# Patient Record
Sex: Female | Born: 1960 | Race: Black or African American | Hispanic: No | Marital: Single | Smoking: Current every day smoker
Health system: Southern US, Community
[De-identification: ages and names within clinical notes are randomized; demographics above are authoritative.]

## PROBLEM LIST (undated history)

## (undated) DIAGNOSIS — J45909 Unspecified asthma, uncomplicated: Secondary | ICD-10-CM

## (undated) DIAGNOSIS — I201 Angina pectoris with documented spasm: Secondary | ICD-10-CM

## (undated) DIAGNOSIS — D369 Benign neoplasm, unspecified site: Secondary | ICD-10-CM

## (undated) DIAGNOSIS — F1910 Other psychoactive substance abuse, uncomplicated: Secondary | ICD-10-CM

## (undated) DIAGNOSIS — I639 Cerebral infarction, unspecified: Secondary | ICD-10-CM

## (undated) DIAGNOSIS — I2510 Atherosclerotic heart disease of native coronary artery without angina pectoris: Secondary | ICD-10-CM

## (undated) DIAGNOSIS — I10 Essential (primary) hypertension: Secondary | ICD-10-CM

## (undated) HISTORY — DX: Angina pectoris with documented spasm: I20.1

---

## 2005-12-09 ENCOUNTER — Emergency Department: Payer: Self-pay | Admitting: Emergency Medicine

## 2006-02-12 ENCOUNTER — Emergency Department: Payer: Self-pay | Admitting: Emergency Medicine

## 2006-02-12 ENCOUNTER — Other Ambulatory Visit: Payer: Self-pay

## 2006-04-19 ENCOUNTER — Emergency Department: Payer: Self-pay | Admitting: Emergency Medicine

## 2006-10-15 ENCOUNTER — Emergency Department: Payer: Self-pay | Admitting: Emergency Medicine

## 2006-10-15 ENCOUNTER — Other Ambulatory Visit: Payer: Self-pay

## 2007-09-24 DIAGNOSIS — F17210 Nicotine dependence, cigarettes, uncomplicated: Secondary | ICD-10-CM | POA: Insufficient documentation

## 2007-09-24 DIAGNOSIS — J45909 Unspecified asthma, uncomplicated: Secondary | ICD-10-CM | POA: Insufficient documentation

## 2007-12-11 DIAGNOSIS — K219 Gastro-esophageal reflux disease without esophagitis: Secondary | ICD-10-CM | POA: Insufficient documentation

## 2008-10-07 ENCOUNTER — Ambulatory Visit: Payer: Self-pay | Admitting: Certified Nurse Midwife

## 2010-01-26 ENCOUNTER — Ambulatory Visit: Payer: Self-pay | Admitting: Family Medicine

## 2010-03-13 ENCOUNTER — Ambulatory Visit: Payer: Self-pay | Admitting: Gastroenterology

## 2010-03-15 LAB — PATHOLOGY REPORT

## 2011-04-02 ENCOUNTER — Ambulatory Visit: Payer: Self-pay | Admitting: Family Medicine

## 2011-10-17 ENCOUNTER — Emergency Department: Payer: Self-pay | Admitting: Emergency Medicine

## 2012-03-13 DIAGNOSIS — H1010 Acute atopic conjunctivitis, unspecified eye: Secondary | ICD-10-CM | POA: Insufficient documentation

## 2012-12-02 ENCOUNTER — Emergency Department: Payer: Self-pay | Admitting: Emergency Medicine

## 2012-12-02 LAB — TROPONIN I: Troponin-I: <0.02 ng/mL

## 2012-12-02 LAB — BASIC METABOLIC PANEL
Anion Gap: 6 — ABNORMAL LOW (ref 7–16)
BUN: 12 mg/dL (ref 7–18)
Calcium, Total: 9.3 mg/dL (ref 8.5–10.1)
Chloride: 110 mmol/L — ABNORMAL HIGH (ref 98–107)
Co2: 24 mmol/L (ref 21–32)
Creatinine: 1.04 mg/dL (ref 0.60–1.30)
EGFR (African American): >60
EGFR (Non-African Amer.): >60
Glucose: 86 mg/dL (ref 65–99)
Osmolality: 278 (ref 275–301)
Potassium: 3.5 mmol/L (ref 3.5–5.1)
Sodium: 140 mmol/L (ref 136–145)

## 2012-12-02 LAB — CBC
HCT: 42.1 % (ref 35.0–47.0)
HGB: 13.8 g/dL (ref 12.0–16.0)
MCH: 28.4 pg (ref 26.0–34.0)
MCHC: 32.7 g/dL (ref 32.0–36.0)
MCV: 87 fL (ref 80–100)
Platelet: 251 10*3/uL (ref 150–440)
RBC: 4.85 10*6/uL (ref 3.80–5.20)
RDW: 13.9 % (ref 11.5–14.5)
WBC: 11.4 10*3/uL — ABNORMAL HIGH (ref 3.6–11.0)

## 2012-12-10 ENCOUNTER — Ambulatory Visit: Payer: Self-pay | Admitting: Family Medicine

## 2013-02-04 ENCOUNTER — Ambulatory Visit: Payer: Self-pay | Admitting: Cardiology

## 2013-07-07 DIAGNOSIS — J441 Chronic obstructive pulmonary disease with (acute) exacerbation: Secondary | ICD-10-CM

## 2013-07-29 ENCOUNTER — Emergency Department: Payer: Self-pay | Admitting: Emergency Medicine

## 2013-07-29 LAB — RAPID INFLUENZA A&B ANTIGENS

## 2013-09-22 DIAGNOSIS — J984 Other disorders of lung: Secondary | ICD-10-CM | POA: Insufficient documentation

## 2013-09-25 ENCOUNTER — Ambulatory Visit: Payer: Self-pay | Admitting: Family Medicine

## 2013-10-21 DIAGNOSIS — I10 Essential (primary) hypertension: Secondary | ICD-10-CM | POA: Insufficient documentation

## 2013-12-17 DIAGNOSIS — Z800 Family history of malignant neoplasm of digestive organs: Secondary | ICD-10-CM | POA: Insufficient documentation

## 2013-12-17 DIAGNOSIS — D369 Benign neoplasm, unspecified site: Secondary | ICD-10-CM | POA: Insufficient documentation

## 2014-01-05 ENCOUNTER — Ambulatory Visit: Payer: Self-pay | Admitting: Gastroenterology

## 2014-01-06 LAB — PATHOLOGY REPORT

## 2014-01-22 ENCOUNTER — Emergency Department: Payer: Self-pay | Admitting: Emergency Medicine

## 2014-01-22 LAB — BASIC METABOLIC PANEL
Anion Gap: 6 — ABNORMAL LOW (ref 7–16)
BUN: 11 mg/dL (ref 7–18)
Calcium, Total: 9 mg/dL (ref 8.5–10.1)
Chloride: 109 mmol/L — ABNORMAL HIGH (ref 98–107)
Co2: 25 mmol/L (ref 21–32)
Creatinine: 1.29 mg/dL (ref 0.60–1.30)
EGFR (African American): 54 — ABNORMAL LOW
EGFR (Non-African Amer.): 47 — ABNORMAL LOW
Glucose: 122 mg/dL — ABNORMAL HIGH (ref 65–99)
Osmolality: 280 (ref 275–301)
Potassium: 4.3 mmol/L (ref 3.5–5.1)
Sodium: 140 mmol/L (ref 136–145)

## 2014-01-22 LAB — CBC
HCT: 43.4 % (ref 35.0–47.0)
HGB: 14 g/dL (ref 12.0–16.0)
MCH: 28.5 pg (ref 26.0–34.0)
MCHC: 32.3 g/dL (ref 32.0–36.0)
MCV: 88 fL (ref 80–100)
Platelet: 245 10*3/uL (ref 150–440)
RBC: 4.91 10*6/uL (ref 3.80–5.20)
RDW: 13.5 % (ref 11.5–14.5)
WBC: 6.7 10*3/uL (ref 3.6–11.0)

## 2014-01-22 LAB — TROPONIN I: Troponin-I: <0.02 ng/mL

## 2014-01-22 LAB — PRO B NATRIURETIC PEPTIDE: B-Type Natriuretic Peptide: 55 pg/mL (ref 0–125)

## 2014-11-10 DIAGNOSIS — F331 Major depressive disorder, recurrent, moderate: Secondary | ICD-10-CM | POA: Insufficient documentation

## 2014-11-22 ENCOUNTER — Other Ambulatory Visit: Payer: Self-pay | Admitting: Family Medicine

## 2014-11-22 DIAGNOSIS — J984 Other disorders of lung: Secondary | ICD-10-CM

## 2014-11-24 ENCOUNTER — Emergency Department: Payer: Medicaid Other

## 2014-11-24 ENCOUNTER — Ambulatory Visit: Admission: RE | Admit: 2014-11-24 | Payer: Medicaid Other | Source: Ambulatory Visit

## 2014-11-24 ENCOUNTER — Inpatient Hospital Stay
Admission: EM | Admit: 2014-11-24 | Discharge: 2014-11-25 | DRG: 282 | Disposition: A | Discharge status: Home / Self Care | Payer: Medicaid Other | Attending: Internal Medicine | Admitting: Internal Medicine

## 2014-11-24 DIAGNOSIS — Z79899 Other long term (current) drug therapy: Secondary | ICD-10-CM

## 2014-11-24 DIAGNOSIS — I10 Essential (primary) hypertension: Secondary | ICD-10-CM | POA: Diagnosis present

## 2014-11-24 DIAGNOSIS — I25111 Atherosclerotic heart disease of native coronary artery with angina pectoris with documented spasm: Secondary | ICD-10-CM | POA: Diagnosis present

## 2014-11-24 DIAGNOSIS — I214 Non-ST elevation (NSTEMI) myocardial infarction: Principal | ICD-10-CM | POA: Diagnosis present

## 2014-11-24 DIAGNOSIS — Z8249 Family history of ischemic heart disease and other diseases of the circulatory system: Secondary | ICD-10-CM

## 2014-11-24 DIAGNOSIS — J45909 Unspecified asthma, uncomplicated: Secondary | ICD-10-CM | POA: Diagnosis present

## 2014-11-24 DIAGNOSIS — R079 Chest pain, unspecified: Secondary | ICD-10-CM | POA: Diagnosis present

## 2014-11-24 DIAGNOSIS — F17210 Nicotine dependence, cigarettes, uncomplicated: Secondary | ICD-10-CM | POA: Diagnosis present

## 2014-11-24 HISTORY — DX: Unspecified asthma, uncomplicated: J45.909

## 2014-11-24 HISTORY — DX: Other psychoactive substance abuse, uncomplicated: F19.10

## 2014-11-24 HISTORY — DX: Atherosclerotic heart disease of native coronary artery without angina pectoris: I25.10

## 2014-11-24 HISTORY — DX: Essential (primary) hypertension: I10

## 2014-11-24 LAB — CBC
HCT: 42.2 % (ref 35.0–47.0)
Hemoglobin: 13.7 g/dL (ref 12.0–16.0)
MCH: 27.9 pg (ref 26.0–34.0)
MCHC: 32.4 g/dL (ref 32.0–36.0)
MCV: 86.1 fL (ref 80.0–100.0)
Platelets: 214 10*3/uL (ref 150–440)
RBC: 4.9 MIL/uL (ref 3.80–5.20)
RDW: 14.1 % (ref 11.5–14.5)
WBC: 11.7 10*3/uL — ABNORMAL HIGH (ref 3.6–11.0)

## 2014-11-24 LAB — BASIC METABOLIC PANEL
Anion gap: 7 (ref 5–15)
BUN: 15 mg/dL (ref 6–20)
CO2: 26 mmol/L (ref 22–32)
Calcium: 9.4 mg/dL (ref 8.9–10.3)
Chloride: 107 mmol/L (ref 101–111)
Creatinine, Ser: 1.28 mg/dL — ABNORMAL HIGH (ref 0.44–1.00)
GFR calc Af Amer: 54 mL/min — ABNORMAL LOW (ref >60)
GFR calc non Af Amer: 47 mL/min — ABNORMAL LOW (ref >60)
Glucose, Bld: 101 mg/dL — ABNORMAL HIGH (ref 65–99)
Potassium: 3.8 mmol/L (ref 3.5–5.1)
Sodium: 140 mmol/L (ref 135–145)

## 2014-11-24 LAB — CK TOTAL AND CKMB (NOT AT ARMC)
CK, MB: 4.9 ng/mL (ref 0.5–5.0)
Relative Index: 2.2 (ref 0.0–2.5)
Total CK: 225 U/L (ref 38–234)

## 2014-11-24 LAB — TROPONIN I
Troponin I: 0.33 ng/mL — ABNORMAL HIGH (ref <0.031)
Troponin I: 0.38 ng/mL — ABNORMAL HIGH (ref <0.031)
Troponin I: 0.56 ng/mL — ABNORMAL HIGH (ref <0.031)

## 2014-11-24 MED ORDER — ONDANSETRON HCL 4 MG/2ML IJ SOLN: 4.0000 mg | Freq: Four times a day (QID) | INTRAMUSCULAR | Status: DC | PRN

## 2014-11-24 MED ORDER — MOMETASONE FURO-FORMOTEROL FUM 100-5 MCG/ACT IN AERO
2.0000 | INHALATION_SPRAY | Freq: Two times a day (BID) | RESPIRATORY_TRACT | Status: DC
  Administered 2014-11-24 – 2014-11-25 (×2): 2 via RESPIRATORY_TRACT
  Filled 2014-11-24: qty 8.8

## 2014-11-24 MED ORDER — ACETAMINOPHEN 325 MG PO TABS
650.0000 mg | ORAL_TABLET | ORAL | Status: DC | PRN
  Administered 2014-11-24: 650 mg via ORAL
  Filled 2014-11-24: qty 2

## 2014-11-24 MED ORDER — ALBUTEROL SULFATE HFA 108 (90 BASE) MCG/ACT IN AERS: 2.0000 | INHALATION_SPRAY | Freq: Four times a day (QID) | RESPIRATORY_TRACT | Status: DC | PRN

## 2014-11-24 MED ORDER — ALBUTEROL SULFATE (2.5 MG/3ML) 0.083% IN NEBU: 2.5000 mg | INHALATION_SOLUTION | Freq: Four times a day (QID) | RESPIRATORY_TRACT | Status: DC | PRN

## 2014-11-24 MED ORDER — HEPARIN SODIUM (PORCINE) 5000 UNIT/ML IJ SOLN
5000.0000 [IU] | Freq: Three times a day (TID) | INTRAMUSCULAR | Status: DC
  Administered 2014-11-24 – 2014-11-25 (×2): 5000 [IU] via SUBCUTANEOUS
  Filled 2014-11-24 (×3): qty 1

## 2014-11-24 MED ORDER — ASPIRIN EC 325 MG PO TBEC
325.0000 mg | DELAYED_RELEASE_TABLET | Freq: Every day | ORAL | Status: DC
  Administered 2014-11-25: 325 mg via ORAL
  Filled 2014-11-24: qty 1

## 2014-11-24 MED ORDER — LISINOPRIL 10 MG PO TABS
10.0000 mg | ORAL_TABLET | Freq: Every day | ORAL | Status: DC
  Filled 2014-11-24: qty 1

## 2014-11-24 MED ORDER — LORATADINE 10 MG PO TABS
10.0000 mg | ORAL_TABLET | Freq: Every day | ORAL | Status: DC
  Administered 2014-11-25: 10 mg via ORAL
  Filled 2014-11-24: qty 1

## 2014-11-24 NOTE — H&P (Signed)
[facility] NAME: Anne [name]    MR#:  [id number]  DATE OF BIRTH:  09/29/1960  DATE OF ADMISSION:  11/24/2014  PRIMARY CARE PHYSICIAN: No primary care provider on file.   REQUESTING/REFERRING PHYSICIAN:Dr LORD  CHIEF COMPLAINT:   Chief Complaint  Patient presents with  . Chest Pain    HISTORY OF PRESENT ILLNESS:  Anne [name]  is a 54 y.o. female with a known history of asthma, hypertension comes to the emergency room after she had experienced some tooth ache this morning. She took 2 BC powder tablets and went to sleep. She woke up with chest pain on the right side which was painful on palpation when she came to the emergency room. Her chest pain is resolved during my evaluation. Patient's troponin came back 0.3. EKG is normal sinus rhythm.  Patient reports that she's been having this kind of pain on and off for many months. She had of negative stress test about a year ago.  PAST MEDICAL HISTORY:   Past Medical History  Diagnosis Date  . Asthma   . Hypertension     PAST SURGICAL HISTOIRY:  History reviewed. No pertinent past surgical history.  SOCIAL HISTORY:   History  Substance Use Topics  . Smoking status: Current Every Day Smoker -- 1.00 packs/day  . Smokeless tobacco: Never Used  . Alcohol Use: Yes    FAMILY HISTORY:  HTN  DRUG ALLERGIES:  No Known Allergies  REVIEW OF SYSTEMS:   Review of Systems  Constitutional: Negative for fever, chills and diaphoresis.  HENT: Negative for congestion, ear pain, hearing loss, nosebleeds and sore throat.   Eyes: Negative for blurred vision, double vision, photophobia and pain.  Respiratory: Negative for hemoptysis, sputum production, shortness of breath, wheezing and stridor.   Cardiovascular: Positive for chest pain. Negative for palpitations, orthopnea, claudication and leg swelling.  Gastrointestinal: Negative for heartburn and abdominal pain.   Genitourinary: Negative for dysuria and frequency.  Musculoskeletal: Negative for back pain, joint pain and neck pain.  Skin: Negative for rash.  Neurological: Negative for tingling, sensory change, speech change, focal weakness, seizures and headaches.  Endo/Heme/Allergies: Does not bruise/bleed easily.  Psychiatric/Behavioral: Negative for memory loss and substance abuse. The patient is not nervous/anxious.   All other systems reviewed and are negative.     MEDICATIONS AT HOME:   Prior to Admission medications   Medication Sig Start Date End Date Taking? Authorizing Provider  albuterol (PROVENTIL HFA;VENTOLIN HFA) 108 (90 BASE) MCG/ACT inhaler Inhale 2 puffs into the lungs every 6 (six) hours as needed for wheezing or shortness of breath.   Yes Historical Provider, MD  albuterol (PROVENTIL) (2.5 MG/3ML) 0.083% nebulizer solution Take 2.5 mg by nebulization every 6 (six) hours as needed for wheezing or shortness of breath.   Yes Historical Provider, MD  cetirizine (ZYRTEC) 10 MG tablet Take 10 mg by mouth daily as needed for allergies.    Yes Historical Provider, MD  Fluticasone-Salmeterol (ADVAIR) 250-50 MCG/DOSE AEPB Inhale 1 puff into the lungs 2 (two) times daily.   Yes Historical Provider, MD  lisinopril (PRINIVIL,ZESTRIL) 10 MG tablet Take 10 mg by mouth daily.   Yes Historical Provider, MD      VITAL SIGNS:  Blood pressure 139/82, pulse 81, temperature 97.7 F (36.5 C), resp. rate 21, height 5\' 8"  (1.727 m), weight 97.523 kg (215 lb), SpO2 97 %.  PHYSICAL EXAMINATION:  GENERAL:  54 y.o.-year-old patient lying in the  bed with no acute distress.  EYES: Pupils equal, round, reactive to light and accommodation. No scleral icterus. Extraocular muscles intact.  HEENT: Head atraumatic, normocephalic. Oropharynx and nasopharynx clear.  NECK:  Supple, no jugular venous distention. No thyroid enlargement, no tenderness.  LUNGS: Normal breath sounds bilaterally, no wheezing,  rales,rhonchi or crepitation. No use of accessory muscles of respiration.  CARDIOVASCULAR: S1, S2 normal. No murmurs, rubs, or gallops.  -pain on palpation at the right 2nd and 3rd costochondral area ABDOMEN: Soft, nontender, nondistended. Bowel sounds present. No organomegaly or mass.  EXTREMITIES: No pedal edema, cyanosis, or clubbing.  NEUROLOGIC: Cranial nerves II through XII are intact. Muscle strength 5/5 in all extremities. Sensation intact. Gait not checked.  PSYCHIATRIC: The patient is alert and oriented x 3.  SKIN: No obvious rash, lesion, or ulcer.   LABORATORY PANEL:   CBC  Recent Labs Lab 11/24/14 1544  WBC 11.7*  HGB 13.7  HCT 42.2  PLT 214   ------------------------------------------------------------------------------------------------------------------  Chemistries   Recent Labs Lab 11/24/14 1544  NA 140  K 3.8  CL 107  CO2 26  GLUCOSE 101*  BUN 15  CREATININE 1.28*  CALCIUM 9.4   ------------------------------------------------------------------------------------------------------------------  Cardiac Enzymes  Recent Labs Lab 11/24/14 1544  TROPONINI 0.33*   ------------------------------------------------------------------------------------------------------------------  RADIOLOGY:  Dg Chest Port 1 View  11/24/2014   CLINICAL DATA:  Chest pain.  Hypertension.  EXAM: PORTABLE CHEST - 1 VIEW  COMPARISON:  January 22, 2014  FINDINGS: There is no edema or consolidation. Heart is upper normal in size with pulmonary vascularity within normal limits. No adenopathy. No bone lesions. No pneumothorax.  IMPRESSION: No edema or consolidation.   Electronically Signed   By: Lowella Grip III M.D.   On: 11/24/2014 16:13    EKG:   NSR. No ST evelation or depression  IMPRESSION AND PLAN:   #1 chest pain right-sided  Precipitated by palpation appears atypical likely secondary to costochondritis. Patient's first troponin was 0.3. EKG no ST elevation or  depression normal sinus rhythm. Patient is chest pain-free at present. She is requesting to go home. Recommended to follow up with cardiology as outpatient. Cardiology on call for unassigned is the bowel cardiology group. Patient is recommended to take baby aspirin daily. She is also recommended take ibuprofen with meals if her pain continues on and off. Recommended follow-up with her PCP. #2 hypertension continue home meds #3 tobacco abuse counseling done about 4 minutes spent patient voices understanding. Patient would like to go home. Respective offer second troponin level. I have asked her for me to review the results before I let her go home she is agreeable to it.    All the records are reviewed and case discussed with Consulting provider. Management plans discussed with the patient, family and they are in agreement.  CODE STATUS:full   TOTAL TIME TAKING CARE OF THIS PATIENT: 50 minutes.    Millard Bautch M.D on 11/24/2014 at 8:36 PM  Between 7am to 6pm - Pager - [phone] After 6pm go to [email or web address] - password [email or web address] [facility]  Office  [phone]  CC: Primary care Physician: No primary care provider on file.

## 2014-11-24 NOTE — ED Notes (Signed)
Pt sent to the ED via EMS from [facility] with c/o having tooth pain this morning around 11am, took a b/c and laid back down, woke back up with chest pain around 1300 and went to scheduled apt at [facility] for chest CT due to a spot found on her lungs.Anne Kitchenand was referred to ED.Anne [name]

## 2014-11-24 NOTE — ED Provider Notes (Signed)
[facility] Emergency Department Provider Note   ____________________________________________  Time seen: 4:30 PM  I have reviewed the triage vital signs and the nursing notes.   HISTORY  Chief Complaint Chest Pain    HPI Anne [name] is a 54 y.o. female presents with right-sided sharp chest pain which lasted for approximately 5 minutes while she was preparing to get a chest CT without contrast as a follow-up from 1 year ago where she was found have a pulmonary nodule on prior chest imaging. Today she had no shortness of breath, no fever, no diaphoresis and the chest pain is now gone. She had an episode of chest pain one week ago. She has had on and off since one year ago. She did have a cardiology evaluation including a stress test last year. Patient is followed by primary care at the [facility] and states that she is going to start seeing a counselor for stress and anxiety. She is on medication for depression. States that she might be having anxiety symptoms.      Past Medical History  Diagnosis Date  . Asthma   . Hypertension     There are no active problems to display for this patient.   History reviewed. No pertinent past surgical history.  Current Outpatient Rx  Name  Route  Sig  Dispense  Refill  . albuterol (PROVENTIL HFA;VENTOLIN HFA) 108 (90 BASE) MCG/ACT inhaler   Inhalation   Inhale 2 puffs into the lungs every 6 (six) hours as needed for wheezing or shortness of breath.         Marland Kitchen albuterol (PROVENTIL) (2.5 MG/3ML) 0.083% nebulizer solution   Nebulization   Take 2.5 mg by nebulization every 6 (six) hours as needed for wheezing or shortness of breath.         . cetirizine (ZYRTEC) 10 MG tablet   Oral   Take 10 mg by mouth daily as needed for allergies.          . Fluticasone-Salmeterol (ADVAIR) 250-50 MCG/DOSE AEPB   Inhalation   Inhale 1 puff into the lungs 2 (two) times daily.         Marland Kitchen lisinopril (PRINIVIL,ZESTRIL) 10  MG tablet   Oral   Take 10 mg by mouth daily.           Allergies Review of patient's allergies indicates no known allergies.  No family history on file.  Social History History  Substance Use Topics  . Smoking status: Current Every Day Smoker -- 1.00 packs/day  . Smokeless tobacco: Never Used  . Alcohol Use: Yes    Review of Systems  Constitutional: Negative for fever. Eyes: Negative for visual changes. ENT: Negative for sore throat. Cardiovascular: Negative for palpitations Respiratory: Negative for shortness of breath. Gastrointestinal: Negative for abdominal pain, vomiting and diarrhea. Genitourinary: Negative for dysuria. Musculoskeletal: Negative for back pain. Skin: Negative for rash. Neurological: Negative for headaches, focal weakness or numbness.   10-point ROS otherwise negative.  ____________________________________________   PHYSICAL EXAM:  VITAL SIGNS: ED Triage Vitals  Enc Vitals Group     BP 11/24/14 1530 169/89 mmHg     Pulse Rate 11/24/14 1530 71     Resp 11/24/14 1530 22     Temp 11/24/14 1530 97.7 F (36.5 C)     Temp src --      SpO2 11/24/14 1530 99 %     Weight 11/24/14 1530 215 lb (97.523 kg)     Height 11/24/14 1530 5'  8" (1.727 m)     Head Cir --      Peak Flow --      Pain Score --      Pain Loc --      Pain Edu? --      Excl. in [place]? --      Constitutional: Alert and oriented. Well appearing and in no distress. Eyes: Conjunctivae are normal. PERRL. Normal extraocular movements. ENT   Head: Normocephalic and atraumatic.   Nose: No congestion/rhinnorhea.   Mouth/Throat: Mucous membranes are moist.   Neck: No stridor. Cardiovascular: Normal rate, regular rhythm.  No murmurs, rubs, or gallops. Respiratory: Normal respiratory effort without tachypnea nor retractions. Breath sounds are clear and equal bilaterally. No wheezes/rales/rhonchi. Gastrointestinal: Soft and nontender. No distention.  Genitourinary:   Musculoskeletal: Chest nontender to palpation Nontender with normal range of motion in all extremities. No joint effusions.  No lower extremity tenderness nor edema. Neurologic:  Normal speech and language. No gross focal neurologic deficits are appreciated. Speech is normal. No gait instability. Skin:  Skin is warm, dry and intact. No rash noted. Psychiatric: Mood and affect are normal. Speech and behavior are normal. Patient exhibits appropriate insight and judgment.  ____________________________________________   EKG  Normal sinus rhythm 62 bpm. Narrow QRS. Normal axis. Normal ST and T waves.  ____________________________________________   LABS (pertinent positives/negatives)  Account 11.7 with a hemoglobin of 13.7 Troponin 0.33 Crowning 1.28 ____________________________________________    RADIOLOGY  Chest x-ray results reviewed: No edema or consolidation  ____________________________________________   PROCEDURES  Procedure(s) performed: No Critical Care performed:  No  ____________________________________________   INITIAL IMPRESSION / ASSESSMENT AND PLAN / ED COURSE  Pertinent labs & imaging results that were available during my care of the patient were reviewed by me and considered in my medical decision making (see chart for details).  Clinically seems like atypical chest pain on the right side however her troponin did come back elevated 0.33. She was already given 324 mg of aspirin by mouth. Consultation to hospitalist for admission. Decision require regarding Regulation will be made by the hospitalist, patient is chest pain-free now.  ____________________________________________   FINAL CLINICAL IMPRESSION(S) / ED DIAGNOSES Acute Nonspecific chest pain     Lisa Roca, MD 11/24/14 1810

## 2014-11-24 NOTE — ED Notes (Signed)
Patient elevated troponin level. Reita Cliche, MD notified.

## 2014-11-25 ENCOUNTER — Encounter
Admission: EM | Disposition: A | Discharge status: Home / Self Care | Payer: Medicaid Other | Source: Home / Self Care | Attending: Internal Medicine

## 2014-11-25 ENCOUNTER — Encounter: Payer: Self-pay | Admitting: Physician Assistant

## 2014-11-25 ENCOUNTER — Encounter
Admission: EM | Disposition: A | Discharge status: Home / Self Care | Payer: Self-pay | Source: Home / Self Care | Attending: Internal Medicine

## 2014-11-25 ENCOUNTER — Telehealth: Payer: Self-pay

## 2014-11-25 DIAGNOSIS — R079 Chest pain, unspecified: Secondary | ICD-10-CM | POA: Diagnosis present

## 2014-11-25 DIAGNOSIS — Z79899 Other long term (current) drug therapy: Secondary | ICD-10-CM | POA: Diagnosis not present

## 2014-11-25 DIAGNOSIS — I214 Non-ST elevation (NSTEMI) myocardial infarction: Secondary | ICD-10-CM | POA: Diagnosis present

## 2014-11-25 DIAGNOSIS — I25111 Atherosclerotic heart disease of native coronary artery with angina pectoris with documented spasm: Secondary | ICD-10-CM | POA: Diagnosis present

## 2014-11-25 DIAGNOSIS — F17210 Nicotine dependence, cigarettes, uncomplicated: Secondary | ICD-10-CM | POA: Diagnosis present

## 2014-11-25 DIAGNOSIS — I10 Essential (primary) hypertension: Secondary | ICD-10-CM

## 2014-11-25 DIAGNOSIS — J45909 Unspecified asthma, uncomplicated: Secondary | ICD-10-CM | POA: Diagnosis present

## 2014-11-25 DIAGNOSIS — I2510 Atherosclerotic heart disease of native coronary artery without angina pectoris: Secondary | ICD-10-CM

## 2014-11-25 DIAGNOSIS — Z8249 Family history of ischemic heart disease and other diseases of the circulatory system: Secondary | ICD-10-CM | POA: Diagnosis not present

## 2014-11-25 HISTORY — PX: CARDIAC CATHETERIZATION: SHX172

## 2014-11-25 LAB — LIPID PANEL
Cholesterol: 142 mg/dL (ref 0–200)
HDL: 41 mg/dL (ref >40)
LDL Cholesterol: 75 mg/dL (ref 0–99)
Total CHOL/HDL Ratio: 3.5 RATIO
Triglycerides: 130 mg/dL (ref <150)
VLDL: 26 mg/dL (ref 0–40)

## 2014-11-25 LAB — HEMOGLOBIN A1C: Hgb A1c MFr Bld: 5.5 % (ref 4.0–6.0)

## 2014-11-25 LAB — TROPONIN I: Troponin I: 0.57 ng/mL — ABNORMAL HIGH (ref <0.031)

## 2014-11-25 SURGERY — LEFT HEART CATH AND CORONARY ANGIOGRAPHY
Anesthesia: Moderate Sedation

## 2014-11-25 SURGERY — LEFT HEART CATH
Anesthesia: Moderate Sedation

## 2014-11-25 MED ORDER — ACETAMINOPHEN 325 MG PO TABS
650.0000 mg | ORAL_TABLET | ORAL | Status: DC | PRN
  Administered 2014-11-25: 650 mg via ORAL

## 2014-11-25 MED ORDER — ISOSORBIDE MONONITRATE ER 30 MG PO TB24
15.0000 mg | ORAL_TABLET | Freq: Every day | ORAL | Status: DC
  Administered 2014-11-25: 15 mg via ORAL
  Filled 2014-11-25: qty 1

## 2014-11-25 MED ORDER — FENTANYL CITRATE (PF) 100 MCG/2ML IJ SOLN
INTRAMUSCULAR | Status: AC
  Filled 2014-11-25: qty 2

## 2014-11-25 MED ORDER — NITROGLYCERIN 0.4 MG SL SUBL: 0.4000 mg | SUBLINGUAL_TABLET | SUBLINGUAL | Status: DC | PRN

## 2014-11-25 MED ORDER — SODIUM CHLORIDE 0.9 % IJ SOLN: 3.0000 mL | Freq: Two times a day (BID) | INTRAMUSCULAR | Status: DC

## 2014-11-25 MED ORDER — MIDAZOLAM HCL 2 MG/2ML IJ SOLN
INTRAMUSCULAR | Status: DC | PRN
  Administered 2014-11-25: 1 mg via INTRAVENOUS

## 2014-11-25 MED ORDER — ASPIRIN 81 MG PO TBEC: 81.0000 mg | DELAYED_RELEASE_TABLET | Freq: Every day | ORAL | Status: DC

## 2014-11-25 MED ORDER — ALBUTEROL SULFATE (2.5 MG/3ML) 0.083% IN NEBU: 2.5000 mg | INHALATION_SOLUTION | Freq: Four times a day (QID) | RESPIRATORY_TRACT | Status: DC | PRN

## 2014-11-25 MED ORDER — SODIUM CHLORIDE 0.9 % WEIGHT BASED INFUSION: 1.0000 mL/kg/h | INTRAVENOUS | Status: DC

## 2014-11-25 MED ORDER — SODIUM CHLORIDE 0.9 % IV SOLN: 250.0000 mL | INTRAVENOUS | Status: DC | PRN

## 2014-11-25 MED ORDER — ASPIRIN 81 MG PO CHEW: 81.0000 mg | CHEWABLE_TABLET | ORAL | Status: DC

## 2014-11-25 MED ORDER — DILTIAZEM HCL ER COATED BEADS 120 MG PO CP24: 120.0000 mg | ORAL_CAPSULE | Freq: Every day | ORAL | Status: DC

## 2014-11-25 MED ORDER — SODIUM CHLORIDE 0.9 % IJ SOLN: 3.0000 mL | INTRAMUSCULAR | Status: DC | PRN

## 2014-11-25 MED ORDER — HEPARIN (PORCINE) IN NACL 2-0.9 UNIT/ML-% IJ SOLN
INTRAMUSCULAR | Status: AC
  Filled 2014-11-25: qty 1000

## 2014-11-25 MED ORDER — SODIUM CHLORIDE 0.9 % WEIGHT BASED INFUSION: 3.0000 mL/kg/h | INTRAVENOUS | Status: DC

## 2014-11-25 MED ORDER — ALBUTEROL SULFATE HFA 108 (90 BASE) MCG/ACT IN AERS: 2.0000 | INHALATION_SPRAY | Freq: Four times a day (QID) | RESPIRATORY_TRACT | Status: DC | PRN

## 2014-11-25 MED ORDER — ISOSORBIDE MONONITRATE ER 30 MG PO TB24: 15.0000 mg | ORAL_TABLET | Freq: Every day | ORAL | Status: DC

## 2014-11-25 MED ORDER — ONDANSETRON HCL 4 MG/2ML IJ SOLN: 4.0000 mg | Freq: Four times a day (QID) | INTRAMUSCULAR | Status: DC | PRN

## 2014-11-25 MED ORDER — CARVEDILOL 3.125 MG PO TABS: 3.1250 mg | ORAL_TABLET | Freq: Two times a day (BID) | ORAL | Status: DC

## 2014-11-25 MED ORDER — FLUTICASONE-SALMETEROL 250-50 MCG/DOSE IN AEPB: 1.0000 | INHALATION_SPRAY | Freq: Two times a day (BID) | RESPIRATORY_TRACT | Status: AC

## 2014-11-25 MED ORDER — FENTANYL CITRATE (PF) 100 MCG/2ML IJ SOLN
INTRAMUSCULAR | Status: DC | PRN
  Administered 2014-11-25: 50 ug via INTRAVENOUS

## 2014-11-25 MED ORDER — IOPAMIDOL (ISOVUE-300) INJECTION 61%
INTRAVENOUS | Status: DC | PRN
  Administered 2014-11-25: 100 mL
  Administered 2014-11-25: 10 mL
  Administered 2014-11-25: 30 mL

## 2014-11-25 MED ORDER — DILTIAZEM HCL ER COATED BEADS 120 MG PO CP24
120.0000 mg | ORAL_CAPSULE | Freq: Every day | ORAL | Status: DC
  Administered 2014-11-25: 120 mg via ORAL
  Filled 2014-11-25: qty 1

## 2014-11-25 MED ORDER — ACETAMINOPHEN 325 MG PO TABS
ORAL_TABLET | ORAL | Status: AC
  Filled 2014-11-25: qty 2

## 2014-11-25 MED ORDER — MIDAZOLAM HCL 2 MG/2ML IJ SOLN
INTRAMUSCULAR | Status: AC
  Filled 2014-11-25: qty 2

## 2014-11-25 MED ORDER — SODIUM CHLORIDE 0.9 % IV SOLN
INTRAVENOUS | Status: AC
  Administered 2014-11-25: 11:00:00 via INTRAVENOUS

## 2014-11-25 SURGICAL SUPPLY — 10 items
CATH INFINITI 5FR ANG PIGTAIL (CATHETERS) ×3
CATH INFINITI 5FR JL4 (CATHETERS) ×3
CATH INFINITI JR4 5F (CATHETERS) ×3
DEVICE CLOSURE MYNXGRIP 5F (Vascular Products) ×3 IMPLANT
KIT MANI 3VAL PERCEP (MISCELLANEOUS) ×3
NEEDLE PERC 18GX7CM (NEEDLE) ×3
NEEDLE SMART 18G ACCESS (NEEDLE) ×3
PACK CARDIAC CATH (CUSTOM PROCEDURE TRAY) ×3
SHEATH AVANTI 5FR X 11CM (SHEATH) ×3
WIRE EMERALD 3MM-J .035X150CM (WIRE) ×3

## 2014-11-25 NOTE — Consult Note (Addendum)
Cardiology Consultation Note  Patient ID: Anne [name], MRN: [id number], DOB/AGE: Apr 19, 1961 54 y.o. Admit date: 11/24/2014   Date of Consult: 11/25/2014 Primary Physician: No primary care provider on file. Primary Cardiologist: New to [facility]  Chief Complaint: Chest pain Reason for Consult: NSTEMI  HPI: 54 y.o. female with h/o nonobstructive CAD by cardiac catheterization in 01/2013, HTN, asthma, and polysubstance who presented to [facility] on 5/11 with a 1 year history of worsening intermittent chest pain, worse and lasting longer on 5/11 prompting her to present to [facility] ED.   She has known nonobstructive CAD by cardiac cath 01/2013 by [facility] that showed 20% tubular stenosis of the proximal RCA and 30% tubular stenosis of the mid RCA. Medical management was recommended at that time. She has not followed up with a cardiologist since that time. Since early 2015 she has continued to have intermittent exertional sharp chest pain that typically lasts "3 minutes" and self resolves. Pain is substernal. Pain has been progressively becoming more frequent. She has continued to smoke 1.5 packs to 2 packs of cigarettes daily since the age of 71. She has a remote history of crack/cocaine abuse approximately 8-10 years ago. She rarely drinks alcohol. She recently got an injection from her PCP for question of leg pain/swelling.   On 5/11 she developed sharp substernal chest pain that was associated with SOB, diaphoresis, and nausea. Chest pain lasted for "hours" until she presented to [facility] ED and received SL NTG x 1. She has been chest pain free since that time. Upon her arrival she was found to have a troponin of 0.33-->0.38-->0.56-->0.57. EKG showed NSR, short PR, TWI V2. CXR showed no edema or consolidation. She was not started on a heparin gtt as it was felt her symptoms were initially atypical in nature. She is currently chest pain free.     Past Medical History  Diagnosis Date  . Asthma   . Hypertension    . Polysubstance abuse     a. remote history of crack/cocaine abuse approximately 8-10 years ago, ongoing tobacco abuse since age 53, rare etoh abuse  . CAD (coronary artery disease)     a. cardiac cath 02/04/2013: tubular 20% stenosis pRCA, tubular 30% stenosis mRCA, medically managed      Most Recent Cardiac Studies: Cardiac cath 02/04/2013:  Insignificant CAD:  Tubular 20% stenosis proximal RCA and tubular 30% stenosis mid RCA. Medical management recommended.   HEMODYNAMIC TABLES   Pressures:  Baseline Pressures:  - HR: 79 Pressures:  - Rhythm: Pressures:  -- Aortic Pressure (S/D/M): 154/84/115 Pressures:  -- Left Ventricle (s/edp): 152/23/--   Surgical History: History reviewed. No pertinent past surgical history.   Home Meds: Prior to Admission medications   Medication Sig Start Date End Date Taking? Authorizing Provider  albuterol (PROVENTIL HFA;VENTOLIN HFA) 108 (90 BASE) MCG/ACT inhaler Inhale 2 puffs into the lungs every 6 (six) hours as needed for wheezing or shortness of breath.   Yes Historical Provider, MD  albuterol (PROVENTIL) (2.5 MG/3ML) 0.083% nebulizer solution Take 2.5 mg by nebulization every 6 (six) hours as needed for wheezing or shortness of breath.   Yes Historical Provider, MD  cetirizine (ZYRTEC) 10 MG tablet Take 10 mg by mouth daily as needed for allergies.    Yes Historical Provider, MD  Fluticasone-Salmeterol (ADVAIR) 250-50 MCG/DOSE AEPB Inhale 1 puff into the lungs 2 (two) times daily.   Yes Historical Provider, MD  lisinopril (PRINIVIL,ZESTRIL) 10 MG tablet Take 10 mg by mouth daily.  Yes Historical Provider, MD    Inpatient Medications:  . [START ON 11/26/2014] aspirin  81 mg Oral Pre-Cath  . aspirin EC  325 mg Oral Daily  . carvedilol  3.125 mg Oral BID WC  . heparin  5,000 Units Subcutaneous 3 times per day  . lisinopril  10 mg Oral Daily  . loratadine  10 mg Oral Daily  . mometasone-formoterol  2 puff Inhalation BID  . sodium chloride  3  mL Intravenous Q12H   . [START ON 11/26/2014] sodium chloride     Followed by  . [START ON 11/26/2014] sodium chloride      Allergies: No Known Allergies  History   Social History  . Marital Status: Legally Separated    Spouse Name: N/A  . Number of Children: N/A  . Years of Education: N/A   Occupational History  . Not on file.   Social History Main Topics  . Smoking status: Current Every Day Smoker -- 1.00 packs/day  . Smokeless tobacco: Never Used  . Alcohol Use: Yes  . Drug Use: No  . Sexual Activity: Yes   Other Topics Concern  . Not on file   Social History Narrative     Family History  Problem Relation Age of Onset  . Cancer - Other Mother     lung  . CAD Father 79     Review of Systems: Review of Systems  Constitutional: Positive for malaise/fatigue and diaphoresis. Negative for fever, chills and weight loss.  Eyes: Negative for discharge and redness.  Respiratory: Positive for cough and shortness of breath. Negative for hemoptysis, sputum production and wheezing.   Cardiovascular: Positive for chest pain and palpitations. Negative for orthopnea, claudication, leg swelling and PND.  Gastrointestinal: Positive for nausea. Negative for heartburn, vomiting, abdominal pain, diarrhea, constipation, blood in stool and melena.  Musculoskeletal: Negative for myalgias.  Skin: Negative for rash.  Neurological: Positive for weakness.  Psychiatric/Behavioral: Positive for substance abuse. The patient is not nervous/anxious.        Prior history of crack/cocaine abuse approximately 8-10 years ago. Ongoing tobacco abuse. Rare etoh abuse  All other systems reviewed and are negative.    Labs:  Recent Labs  11/24/14 1544 11/24/14 1949 11/24/14 2238 11/25/14 0119  CKTOTAL  --  225  --   --   CKMB  --  4.9  --   --   TROPONINI 0.33* 0.38* 0.56* 0.57*   Lab Results  Component Value Date   WBC 11.7* 11/24/2014   HGB 13.7 11/24/2014   HCT 42.2 11/24/2014   MCV  86.1 11/24/2014   PLT 214 11/24/2014     Recent Labs Lab 11/24/14 1544  NA 140  K 3.8  CL 107  CO2 26  BUN 15  CREATININE 1.28*  CALCIUM 9.4  GLUCOSE 101*   No results found for: CHOL, HDL, LDLCALC, TRIG No results found for: DDIMER  Radiology/Studies:  Dg Chest Port 1 View  11/24/2014   CLINICAL DATA:  Chest pain.  Hypertension.  EXAM: PORTABLE CHEST - 1 VIEW  COMPARISON:  January 22, 2014  FINDINGS: There is no edema or consolidation. Heart is upper normal in size with pulmonary vascularity within normal limits. No adenopathy. No bone lesions. No pneumothorax.  IMPRESSION: No edema or consolidation.   Electronically Signed   By: Lowella Grip III M.D.   On: 11/24/2014 16:13    EKG: NSR, short PR, TWI V2  Weights: Filed Weights   11/24/14 1530  Weight:  215 lb (97.523 kg)     Physical Exam: Blood pressure 147/88, pulse 74, temperature 97 F (36.1 C), temperature source Oral, resp. rate 2, height 5\' 8"  (1.727 m), weight 215 lb (97.523 kg), SpO2 100 %. Body mass index is 32.7 kg/(m^2). General: Well developed, well nourished, in no acute distress. Head: Normocephalic, atraumatic, sclera non-icteric, no xanthomas, nares are without discharge.  Neck: Negative for carotid bruits. JVD not elevated. Lungs: Clear bilaterally to auscultation without wheezes, rales, or rhonchi. Breathing is unlabored. Heart: RRR with S1 S2. No murmurs, rubs, or gallops appreciated. Abdomen: Soft, non-tender, non-distended with normoactive bowel sounds. No hepatomegaly. No rebound/guarding. No obvious abdominal masses. Msk:  Strength and tone appear normal for age. Extremities: No clubbing or cyanosis. No edema.  Distal pedal pulses are 2+ and equal bilaterally. Neuro: Alert and oriented X 3. No facial asymmetry. No focal deficit. Moves all extremities spontaneously. Psych:  Responds to questions appropriately with a normal affect.    Assessment and Plan:  54 y.o. female with h/o nonobstructive  CAD by cardiac catheterization in 01/2013, HTN, asthma, and polysubstance who presented to [facility] on 5/11 with a 1 year history of worsening intermittent chest pain, worse and lasting longer on 5/11, found to have NSTEMI.  1. NSTEMI: -Previous cardiac cath 02/04/2013 with tubular 20% stenosis proximal RCA and tubular 30% stenosis mid RCA, medically managed  -Cardiac catheterization this morning -Risks and benefits of cardiac cath were discussed in detail with the patient including risks of bleeding, bruising, infection, renal damage, stroke, and death. She understands these risks and is willing to move forward with the procedure -Add Coreg 3.125 mg bid post cath -Continue aspirin  -Heparin  -Lipitor 80 mg daily -Check FLP and A1C  2. HTN: -Uncontrolled -Coreg added as above -Continue lisinopril (held pre-cath 2/2 SCr) -Weight loss -Follow up with PCP  3. Polysubstance abuse: -Remote history of crack/cocaine abuse -Check urine drug screen -Tobacco cessation advised    Signed, DUNN,RYAN PA-C 11/25/2014, 9:13 AM   Attending note Patient seen and examined this morning on rounds with Christell Faith, Various treatment options discussed with her including stress testing versus cardiac catheterization. She reports that she had prior stress test and preferred a cardiac catheterization. She would likely be high risk of breast attenuation artifact. Plan was made to proceed with cardiac catheterization given elevated cardiac enzymes, symptoms concerning for angina. Also with shortness of breath symptoms recently.  Cardiac catheterization showed mild disease in the circumflex and RCA Moderate spasm noted in the RCA that improved on reimaging. This raises the concern for periodic spasm.  Recommended she be started on calcium channel blockers, long-acting nitrates and have nitroglycerin sublingual to take as needed for symptoms. Recommend she follow up in clinic  Signed Esmond Plants, M.D.

## 2014-11-25 NOTE — Progress Notes (Signed)
[place] at [place] NAME: Anne [name]    MR#:  [id number]  DATE OF BIRTH:  Mar 11, 1961  SUBJECTIVE:  CHIEF COMPLAINT:   Chief Complaint  Patient presents with  . Chest Pain   patient admitted with sharp chest pain which has been intermittent over the past year. Prior workup with cardiac cath showed only minimal coronary artery disease 2 years ago. This morning she denies any chest pain however her troponins were elevated. So she will undergo cardiac catheterization today. Cardiology has been consulted.  REVIEW OF SYSTEMS:  Review of Systems  Constitutional: Negative for fever and chills.  Respiratory: Negative for cough, shortness of breath and wheezing.   Cardiovascular: Negative for chest pain and palpitations.  Gastrointestinal: Negative for nausea, vomiting, abdominal pain, diarrhea and constipation.  Genitourinary: Negative for dysuria.  Neurological: Negative for dizziness, seizures and headaches.    DRUG ALLERGIES:  No Known Allergies  VITALS:  Blood pressure 149/91, pulse 68, temperature 97.9 F (36.6 C), temperature source Oral, resp. rate 18, height 5\' 8"  (1.727 m), weight 97.523 kg (215 lb), SpO2 96 %.  PHYSICAL EXAMINATION:  Physical Exam  GENERAL:  54 y.o.-year-old patient lying in the bed with no acute distress.  EYES: Pupils equal, round, reactive to light and accommodation. No scleral icterus. Extraocular muscles intact.  HEENT: Head atraumatic, normocephalic. Oropharynx and nasopharynx clear.  NECK:  Supple, no jugular venous distention. No thyroid enlargement, no tenderness.  LUNGS: Normal breath sounds bilaterally, scattered wheezing present. No rales,rhonchi or crepitation. No use of accessory muscles of respiration.  CARDIOVASCULAR: S1, S2 normal. No murmurs, rubs, or gallops.  ABDOMEN: Soft, nontender, nondistended. Bowel sounds present. No organomegaly or mass.  EXTREMITIES: No pedal edema, cyanosis,  or clubbing.  NEUROLOGIC: Cranial nerves II through XII are intact. Muscle strength 5/5 in all extremities. Sensation intact. Gait not checked.  PSYCHIATRIC: The patient is alert and oriented x 3.  SKIN: No obvious rash, lesion, or ulcer.    LABORATORY PANEL:   CBC  Recent Labs Lab 11/24/14 1544  WBC 11.7*  HGB 13.7  HCT 42.2  PLT 214   ------------------------------------------------------------------------------------------------------------------  Chemistries   Recent Labs Lab 11/24/14 1544  NA 140  K 3.8  CL 107  CO2 26  GLUCOSE 101*  BUN 15  CREATININE 1.28*  CALCIUM 9.4   ------------------------------------------------------------------------------------------------------------------  Cardiac Enzymes  Recent Labs Lab 11/25/14 0119  TROPONINI 0.57*   ------------------------------------------------------------------------------------------------------------------  RADIOLOGY:  Dg Chest Port 1 View  11/24/2014   CLINICAL DATA:  Chest pain.  Hypertension.  EXAM: PORTABLE CHEST - 1 VIEW  COMPARISON:  January 22, 2014  FINDINGS: There is no edema or consolidation. Heart is upper normal in size with pulmonary vascularity within normal limits. No adenopathy. No bone lesions. No pneumothorax.  IMPRESSION: No edema or consolidation.   Electronically Signed   By: Lowella Grip III M.D.   On: 11/24/2014 16:13    EKG:   Orders placed or performed during the hospital encounter of 11/24/14  . ED EKG (<19mins upon arrival to the ED)  . ED EKG (<73mins upon arrival to the ED)  . EKG 12-Lead  . EKG 12-Lead    ASSESSMENT AND PLAN:   54 year old female with past medical history significant for hypertension and ongoing smoking admitted for intermittent chest pain going on for almost a year worse yesterday.   #1 non-ST segment elevation MI-troponins elevated but plateaued. Prior cardiac catheterization 2 years ago showing minimal  coronary artery disease. Plan is to do  a cardiac catheterization today. Patient denies any current chest pain. We'll start an aspirin. Appreciate cardiology consult.  #2 hypertension-continue home medications. was started and Imdur and calcium channel blocker.  #3 tobacco use disorder-counseled against smoking. No nicotine replacements in the hospital.  #4 possible underlying COPD-secondary to chronic smoking. Scattered wheezes on exam. Continue inhalers at the time of discharge.   Addendum-cardiac catheterization showing coronary vasospasm. Patient is asymptomatic at this time. We' will discharge on Imdur and Cardizem.   All the records are reviewed and case discussed with Care Management/Social Workerr. Management plans discussed with the patient, family and they are in agreement.  CODE STATUS: FULL CODE  TOTAL TIME TAKING CARE OF THIS PATIENT: 40 minutes.   POSSIBLE D/C TODAY, DEPENDING ON CLINICAL CONDITION.   Gladstone Lighter M.D on 11/25/2014 at 12:04 PM  Between 7am to 6pm - Pager - [phone]  After 6pm go to [email or web address] - password [email or web address] [facility]  [facility]  Office  [phone]  CC: Primary care physician; No primary care provider on file.

## 2014-11-25 NOTE — Telephone Encounter (Signed)
-----   Message from Anne [name] sent at 11/25/2014 12:29 PM EDT ----- Regarding: TCM HP Pt is a tcm saw dr Rockey Situ coming may 27th at 945am

## 2014-11-25 NOTE — Telephone Encounter (Signed)
Attempted to contact pt regarding discharge from [facility] on 11/25/14. Left message on pt's vm asking her to call back w/ any questions or concerns regarding discharge instructions and/or medications.  Advised pt of appt w/ Dr. Rockey Situ on 12/10/14 @ 9:45. Asked her to call back if she is unable to keep this appt or if she needs directions to our office.

## 2014-11-25 NOTE — Care Management (Signed)
Patient admitted with chest pain under observation. Ruled in for  NSTEMI and cardiac cath pending.   Patient presents from home and independent in all adls.   Has medicaid.  Denies issues accessing medical care, obtaining medications, maintaining housing, utilities and food.   No discharge needs identified at present time.

## 2014-11-25 NOTE — Discharge Summary (Signed)
[place] at [place] NAME: Anne [name]    MR#:  [id number]  DATE OF BIRTH:  1960/12/16  DATE OF ADMISSION:  11/24/2014 ADMITTING PHYSICIAN: Fritzi Mandes, MD  DATE OF DISCHARGE: No discharge date for patient encounter.  PRIMARY CARE PHYSICIAN: No primary care provider on file.    ADMISSION DIAGNOSIS:  Chest pain, unspecified chest pain type [R07.9]  DISCHARGE DIAGNOSIS:  Active Problems:   Chest pain   NSTEMI (non-ST elevated myocardial infarction)  coronary vasospasm as noted on cardiac catheterization.  SECONDARY DIAGNOSIS:   Past Medical History  Diagnosis Date  . Asthma   . Hypertension   . Polysubstance abuse     a. remote history of crack/cocaine abuse approximately 8-10 years ago, ongoing tobacco abuse since age 89, rare etoh abuse  . CAD (coronary artery disease)     a. cardiac cath 02/04/2013: tubular 20% stenosis pRCA, tubular 30% stenosis mRCA, medically managed    HOSPITAL COURSE:   54 year old female with past medical history significant for hypertension and ongoing smoking admitted for intermittent chest pain going on for almost a year worse yesterday.  #1 non-ST segment elevation MI-troponins elevated but plateaued. Prior cardiac catheterization 2 years ago showing minimal coronary artery disease. Plan is to do a cardiac catheterization today. Patient denies any current chest pain. We'll start an aspirin. Appreciate cardiology consult.  #2 hypertension-continue home medications. was started and Imdur and calcium channel blocker.  #3 tobacco use disorder-counseled against smoking. No nicotine replacements in the hospital.  #4 possible underlying COPD-secondary to chronic smoking. Scattered wheezes on exam. Continue inhalers at the time of discharge.   Addendum-cardiac catheterization showing coronary vasospasm. Patient is asymptomatic at this time. We' will discharge on Imdur and Cardizem.  DISCHARGE  CONDITIONS:  Stable   CONSULTS OBTAINED:    cardiology consultation by Dr. Ida Rogue  DRUG ALLERGIES:  No Known Allergies  DISCHARGE MEDICATIONS:   Current Discharge Medication List    START taking these medications   Details  aspirin EC 81 MG EC tablet Take 1 tablet (81 mg total) by mouth daily. Qty: 30 tablet, Refills: 1    diltiazem (CARDIZEM CD) 120 MG 24 hr capsule Take 1 capsule (120 mg total) by mouth daily. Qty: 30 capsule, Refills: 1    isosorbide mononitrate (IMDUR) 30 MG 24 hr tablet Take 0.5 tablets (15 mg total) by mouth daily. Qty: 30 tablet, Refills: 1    nitroGLYCERIN (NITROSTAT) 0.4 MG SL tablet Place 1 tablet (0.4 mg total) under the tongue every 5 (five) minutes as needed for chest pain. Qty: 30 tablet, Refills: 0      CONTINUE these medications which have CHANGED   Details  albuterol (PROVENTIL HFA;VENTOLIN HFA) 108 (90 BASE) MCG/ACT inhaler Inhale 2 puffs into the lungs every 6 (six) hours as needed for wheezing or shortness of breath. Qty: 1 Inhaler, Refills: 1    albuterol (PROVENTIL) (2.5 MG/3ML) 0.083% nebulizer solution Take 3 mLs (2.5 mg total) by nebulization every 6 (six) hours as needed for wheezing or shortness of breath. Qty: 75 mL, Refills: 0    Fluticasone-Salmeterol (ADVAIR) 250-50 MCG/DOSE AEPB Inhale 1 puff into the lungs 2 (two) times daily. Qty: 60 each, Refills: 1      CONTINUE these medications which have NOT CHANGED   Details  cetirizine (ZYRTEC) 10 MG tablet Take 10 mg by mouth daily as needed for allergies.     lisinopril (PRINIVIL,ZESTRIL) 10 MG tablet Take 10 mg  by mouth daily.         DISCHARGE INSTRUCTIONS:    If you experience worsening of your admission symptoms, develop shortness of breath, life threatening emergency, suicidal or homicidal thoughts you must seek medical attention immediately by calling 911 or calling your MD immediately  if symptoms less severe.  You Must read complete  instructions/literature along with all the possible adverse reactions/side effects for all the Medicines you take and that have been prescribed to you. Take any new Medicines after you have completely understood and accept all the possible adverse reactions/side effects.   Please note  You were cared for by a hospitalist during your hospital stay. If you have any questions about your discharge medications or the care you received while you were in the hospital after you are discharged, you can call the unit and asked to speak with the hospitalist on call if the hospitalist that took care of you is not available. Once you are discharged, your primary care physician will handle any further medical issues. Please note that NO REFILLS for any discharge medications will be authorized once you are discharged, as it is imperative that you return to your primary care physician (or establish a relationship with a primary care physician if you do not have one) for your aftercare needs so that they can reassess your need for medications and monitor your lab values.    Today   CHIEF COMPLAINT:   Chief Complaint  Patient presents with  . Chest Pain     VITAL SIGNS:  Blood pressure 149/91, pulse 68, temperature 97.9 F (36.6 C), temperature source Oral, resp. rate 18, height 5\' 8"  (1.727 m), weight 97.523 kg (215 lb), SpO2 96 %.  I/O:   Intake/Output Summary (Last 24 hours) at 11/25/14 1216 Last data filed at 11/25/14 0327  Gross per 24 hour  Intake      0 ml  Output    900 ml  Net   -900 ml    PHYSICAL EXAMINATION:   Physical Exam  GENERAL:  54 y.o.-year-old patient lying in the bed with no acute distress.  EYES: Pupils equal, round, reactive to light and accommodation. No scleral icterus. Extraocular muscles intact.  HEENT: Head atraumatic, normocephalic. Oropharynx and nasopharynx clear.  NECK:  Supple, no jugular venous distention. No thyroid enlargement, no tenderness.  LUNGS: Normal  breath sounds bilaterally, no wheezing, rales,rhonchi or crepitation. No use of accessory muscles of respiration.  CARDIOVASCULAR: S1, S2 normal. No murmurs, rubs, or gallops.  ABDOMEN: Soft, non-tender, non-distended. Bowel sounds present. No organomegaly or mass.  EXTREMITIES: No pedal edema, cyanosis, or clubbing.  NEUROLOGIC: Cranial nerves II through XII are intact. Muscle strength 5/5 in all extremities. Sensation intact. Gait not checked.  PSYCHIATRIC: The patient is alert and oriented x 3.  SKIN: No obvious rash, lesion, or ulcer.   DATA REVIEW:   CBC  Recent Labs Lab 11/24/14 1544  WBC 11.7*  HGB 13.7  HCT 42.2  PLT 214    Chemistries   Recent Labs Lab 11/24/14 1544  NA 140  K 3.8  CL 107  CO2 26  GLUCOSE 101*  BUN 15  CREATININE 1.28*  CALCIUM 9.4    Cardiac Enzymes  Recent Labs Lab 11/25/14 0119  TROPONINI 0.57*    Microbiology Results  Results for orders placed or performed in visit on 07/29/13  Influenza A&B Antigens [facility])     Status: None   Collection Time: 07/29/13 10:48 PM  Result Value Ref  Range Status   Micro Text Report   Final       COMMENT                   NEGATIVE FOR INFLUENZA A (ANTIGEN ABSENT)   COMMENT                   NEGATIVE FOR INFLUENZA B (ANTIGEN ABSENT)   ANTIBIOTIC                                                        RADIOLOGY:  Dg Chest Port 1 View  11/24/2014   CLINICAL DATA:  Chest pain.  Hypertension.  EXAM: PORTABLE CHEST - 1 VIEW  COMPARISON:  January 22, 2014  FINDINGS: There is no edema or consolidation. Heart is upper normal in size with pulmonary vascularity within normal limits. No adenopathy. No bone lesions. No pneumothorax.  IMPRESSION: No edema or consolidation.   Electronically Signed   By: Lowella Grip III M.D.   On: 11/24/2014 16:13    EKG:   Orders placed or performed during the hospital encounter of 11/24/14  . ED EKG (<8mins upon arrival to the ED)  . ED EKG (<38mins upon arrival to the  ED)  . EKG 12-Lead  . EKG 12-Lead      Management plans discussed with the patient, family and they are in agreement.  CODE STATUS:     Code Status Orders        Start     Ordered   11/25/14 1058  Full code   Continuous     11/25/14 1102      TOTAL TIME TAKING CARE OF THIS PATIENT:  minutes.    Gladstone Lighter M.D on 11/25/2014 at 12:16 PM  Between 7am to 6pm - Pager - [phone]  After 6pm go to [email or web address] - password [email or web address]  [facility]  Office  [phone]  CC: Primary care physician; No primary care provider on file.

## 2014-11-25 NOTE — Discharge Instructions (Signed)
DIET:  Cardiac diet  DISCHARGE CONDITION:  Good  ACTIVITY:  Activity as tolerated  OXYGEN:  Home Oxygen: No.   Oxygen Delivery: room air  DISCHARGE LOCATION:  home   If you experience worsening of your admission symptoms, develop shortness of breath, life threatening emergency, suicidal or homicidal thoughts you must seek medical attention immediately by calling 911 or calling your MD immediately  if symptoms less severe.  You Must read complete instructions/literature along with all the possible adverse reactions/side effects for all the Medicines you take and that have been prescribed to you. Take any new Medicines after you have completely understood and accpet all the possible adverse reactions/side effects.   Please note  You were cared for by a hospitalist during your hospital stay. If you have any questions about your discharge medications or the care you received while you were in the hospital after you are discharged, you can call the unit and asked to speak with the hospitalist on call if the hospitalist that took care of you is not available. Once you are discharged, your primary care physician will handle any further medical issues. Please note that NO REFILLS for any discharge medications will be authorized once you are discharged, as it is imperative that you return to your primary care physician (or establish a relationship with a primary care physician if you do not have one) for your aftercare needs so that they can reassess your need for medications and monitor your lab values.   Coronary Angiogram A coronary angiogram, also called coronary angiography, is an X-ray procedure used to look at the arteries in the heart. In this procedure, a dye (contrast dye) is injected through a long, hollow tube (catheter). The catheter is about the size of a piece of cooked spaghetti and is inserted through your groin, wrist, or arm. The dye is injected into each artery, and X-rays are  then taken to show if there is a blockage in the arteries of your heart. LET [facility] CARE PROVIDER KNOW ABOUT:  Any allergies you have, including allergies to shellfish or contrast dye.   All medicines you are taking, including vitamins, herbs, eye drops, creams, and over-the-counter medicines.   Previous problems you or members of your family have had with the use of anesthetics.   Any blood disorders you have.   Previous surgeries you have had.  History of kidney problems or failure.   Other medical conditions you have. RISKS AND COMPLICATIONS  Generally, a coronary angiogram is a safe procedure. However, problems can occur and include:  Allergic reaction to the dye.  Bleeding from the access site or other locations.  Kidney injury, especially in people with impaired kidney function.  Stroke (rare).  Heart attack (rare). BEFORE THE PROCEDURE   Do not eat or drink anything after midnight the night before the procedure or as directed by your health care provider.   Ask your health care provider about changing or stopping your regular medicines. This is especially important if you are taking diabetes medicines or blood thinners. PROCEDURE  You may be given a medicine to help you relax (sedative) before the procedure. This medicine is given through an intravenous (IV) access tube that is inserted into one of your veins.   The area where the catheter will be inserted will be washed and shaved. This is usually done in the groin but may be done in the fold of your arm (near your elbow) or in the wrist.  A medicine will be given to numb the area where the catheter will be inserted (local anesthetic).   The health care provider will insert the catheter into an artery. The catheter will be guided by using a special type of X-ray (fluoroscopy) of the blood vessel being examined.   A special dye will then be injected into the catheter, and X-rays will be taken. The  dye will help to show where any narrowing or blockages are located in the heart arteries.  AFTER THE PROCEDURE   If the procedure is done through the leg, you will be kept in bed lying flat for several hours. You will be instructed to not bend or cross your legs.  The insertion site will be checked frequently.   The pulse in your feet or wrist will be checked frequently.   Additional blood tests, X-rays, and an electrocardiogram may be done.  Document Released: 01/06/2003 Document Revised: 11/16/2013 Document Reviewed: 11/24/2012 [facility] Patient Information 2015 [place], [place]. This information is not intended to replace advice given to you by your health care provider. Make sure you discuss any questions you have with your health care provider.

## 2014-11-26 ENCOUNTER — Encounter: Payer: Self-pay | Admitting: Physician Assistant

## 2014-11-26 DIAGNOSIS — J45909 Unspecified asthma, uncomplicated: Secondary | ICD-10-CM | POA: Insufficient documentation

## 2014-11-26 DIAGNOSIS — I2510 Atherosclerotic heart disease of native coronary artery without angina pectoris: Secondary | ICD-10-CM | POA: Insufficient documentation

## 2014-11-26 DIAGNOSIS — F1910 Other psychoactive substance abuse, uncomplicated: Secondary | ICD-10-CM | POA: Insufficient documentation

## 2014-11-26 DIAGNOSIS — I201 Angina pectoris with documented spasm: Secondary | ICD-10-CM | POA: Insufficient documentation

## 2014-11-26 DIAGNOSIS — I10 Essential (primary) hypertension: Secondary | ICD-10-CM | POA: Insufficient documentation

## 2014-12-03 ENCOUNTER — Other Ambulatory Visit: Payer: Self-pay | Admitting: Family Medicine

## 2014-12-03 DIAGNOSIS — Z1231 Encounter for screening mammogram for malignant neoplasm of breast: Secondary | ICD-10-CM

## 2014-12-09 ENCOUNTER — Encounter: Payer: Medicaid Other | Admitting: Cardiovascular Disease

## 2014-12-09 ENCOUNTER — Encounter: Payer: Self-pay | Admitting: *Deleted

## 2014-12-14 ENCOUNTER — Ambulatory Visit
Admission: RE | Admit: 2014-12-14 | Discharge: 2014-12-14 | Disposition: A | Discharge status: Home / Self Care | Payer: Medicaid Other | Source: Ambulatory Visit | Attending: Family Medicine | Admitting: Family Medicine

## 2014-12-14 ENCOUNTER — Telehealth: Payer: Self-pay

## 2014-12-14 DIAGNOSIS — R911 Solitary pulmonary nodule: Secondary | ICD-10-CM | POA: Insufficient documentation

## 2014-12-14 DIAGNOSIS — I2510 Atherosclerotic heart disease of native coronary artery without angina pectoris: Secondary | ICD-10-CM | POA: Insufficient documentation

## 2014-12-14 DIAGNOSIS — M4856XA Collapsed vertebra, not elsewhere classified, lumbar region, initial encounter for fracture: Secondary | ICD-10-CM | POA: Diagnosis not present

## 2014-12-14 DIAGNOSIS — J984 Other disorders of lung: Secondary | ICD-10-CM

## 2014-12-14 NOTE — Telephone Encounter (Signed)
Patient called to confirm an appt aware that [facility] should have been npo for last 4 hours and should arrive at 145 patient thought she was supposed to arrive at 2 and is on her way now.

## 2014-12-21 ENCOUNTER — Ambulatory Visit
Admission: RE | Admit: 2014-12-21 | Discharge: 2014-12-21 | Disposition: A | Discharge status: Home / Self Care | Payer: Medicaid Other | Source: Ambulatory Visit | Attending: Family Medicine | Admitting: Family Medicine

## 2014-12-21 DIAGNOSIS — Z1231 Encounter for screening mammogram for malignant neoplasm of breast: Secondary | ICD-10-CM | POA: Diagnosis present

## 2015-01-03 ENCOUNTER — Other Ambulatory Visit: Payer: Self-pay | Admitting: Family Medicine

## 2015-02-25 ENCOUNTER — Other Ambulatory Visit: Payer: Self-pay | Admitting: Family Medicine

## 2015-11-24 ENCOUNTER — Other Ambulatory Visit: Payer: Self-pay | Admitting: Family Medicine

## 2015-12-09 ENCOUNTER — Other Ambulatory Visit: Payer: Self-pay | Admitting: Family Medicine

## 2015-12-09 DIAGNOSIS — Z1239 Encounter for other screening for malignant neoplasm of breast: Secondary | ICD-10-CM

## 2015-12-09 DIAGNOSIS — IMO0002 Reserved for concepts with insufficient information to code with codable children: Secondary | ICD-10-CM

## 2015-12-28 ENCOUNTER — Other Ambulatory Visit: Payer: Self-pay

## 2015-12-28 ENCOUNTER — Ambulatory Visit: Payer: Self-pay

## 2016-01-26 ENCOUNTER — Ambulatory Visit: Payer: Medicaid Other

## 2016-02-16 ENCOUNTER — Ambulatory Visit: Payer: Medicaid Other

## 2016-02-16 ENCOUNTER — Other Ambulatory Visit: Payer: Medicaid Other

## 2016-03-14 ENCOUNTER — Ambulatory Visit
Admission: RE | Admit: 2016-03-14 | Discharge: 2016-03-14 | Disposition: A | Discharge status: Home / Self Care | Payer: Medicaid Other | Source: Ambulatory Visit | Attending: Family Medicine | Admitting: Family Medicine

## 2016-03-14 DIAGNOSIS — T148 Other injury of unspecified body region: Secondary | ICD-10-CM | POA: Insufficient documentation

## 2016-03-14 DIAGNOSIS — M8589 Other specified disorders of bone density and structure, multiple sites: Secondary | ICD-10-CM | POA: Insufficient documentation

## 2016-03-14 DIAGNOSIS — IMO0002 Reserved for concepts with insufficient information to code with codable children: Secondary | ICD-10-CM

## 2016-03-14 DIAGNOSIS — Z1231 Encounter for screening mammogram for malignant neoplasm of breast: Secondary | ICD-10-CM | POA: Diagnosis not present

## 2016-03-14 DIAGNOSIS — Z1239 Encounter for other screening for malignant neoplasm of breast: Secondary | ICD-10-CM | POA: Diagnosis present

## 2016-09-30 ENCOUNTER — Encounter: Payer: Self-pay | Admitting: Emergency Medicine

## 2016-09-30 ENCOUNTER — Emergency Department
Admission: EM | Admit: 2016-09-30 | Discharge: 2016-09-30 | Disposition: A | Discharge status: Home / Self Care | Payer: Medicaid Other | Attending: Emergency Medicine | Admitting: Emergency Medicine

## 2016-09-30 ENCOUNTER — Emergency Department: Payer: Medicaid Other

## 2016-09-30 DIAGNOSIS — J45909 Unspecified asthma, uncomplicated: Secondary | ICD-10-CM | POA: Diagnosis not present

## 2016-09-30 DIAGNOSIS — I2510 Atherosclerotic heart disease of native coronary artery without angina pectoris: Secondary | ICD-10-CM | POA: Insufficient documentation

## 2016-09-30 DIAGNOSIS — R600 Localized edema: Secondary | ICD-10-CM | POA: Diagnosis present

## 2016-09-30 DIAGNOSIS — F17200 Nicotine dependence, unspecified, uncomplicated: Secondary | ICD-10-CM | POA: Diagnosis not present

## 2016-09-30 DIAGNOSIS — I10 Essential (primary) hypertension: Secondary | ICD-10-CM | POA: Diagnosis not present

## 2016-09-30 DIAGNOSIS — L97211 Non-pressure chronic ulcer of right calf limited to breakdown of skin: Secondary | ICD-10-CM | POA: Insufficient documentation

## 2016-09-30 DIAGNOSIS — I83012 Varicose veins of right lower extremity with ulcer of calf: Secondary | ICD-10-CM | POA: Insufficient documentation

## 2016-09-30 DIAGNOSIS — L539 Erythematous condition, unspecified: Secondary | ICD-10-CM

## 2016-09-30 DIAGNOSIS — Z7982 Long term (current) use of aspirin: Secondary | ICD-10-CM | POA: Diagnosis not present

## 2016-09-30 DIAGNOSIS — R609 Edema, unspecified: Secondary | ICD-10-CM

## 2016-09-30 DIAGNOSIS — I872 Venous insufficiency (chronic) (peripheral): Secondary | ICD-10-CM

## 2016-09-30 NOTE — ED Triage Notes (Signed)
Pt states she thinks she got bit on the back of her R calf approx 2 months ago. Pt states area has not healed and has progressively gotten worse. Pt has area of redness and swelling to her calf.

## 2016-09-30 NOTE — ED Notes (Signed)
Erythema and ecchymosis to right lower calf, pt c/o itching and clear drainage at times. Tender to touch.

## 2016-09-30 NOTE — ED Provider Notes (Signed)
[facility] Emergency Department Provider Note  ____________________________________________  Time seen: Approximately 6:10 PM  I have reviewed the triage vital signs and the nursing notes.   HISTORY  Chief Complaint Insect Bite   HPI Anne [name] is a 56 y.o. female with a history of coronary vasospasm, coronary artery disease, and NSTEMI, presents to the emergency department with right lower extremity edema, atrophie blanche and superficial ulceration with irregular edges of the skin overlying the dorsal right shank. Patient states that she has experienced aforementioned symptoms for the past 2 months. Patient denies a history of DVT, PE, and recent surgery. Patient states that she has been sedentary more than usual and has recently gained weight. She is a daily smoker. No recent travel. She denies chest pain, chest tightness, shortness of breath, abdominal pain, nausea and vomiting. No alleviating measures have been attempted. Patient has been afebrile.   Past Medical History:  Diagnosis Date  . Asthma   . CAD (coronary artery disease)    a. cardiac cath 02/04/2013: tubular 20% stenosis pRCA, tubular 30% stenosis mRCA, medically managed  . Coronary vasospasm (HCC)    a. cardiac cath 11/25/2014: mLCx 30%, pRCA 30%, moderate spasm w/ noted improvement w/ reimaging, recommended CCB, long acting nitrate, smoking cessation, & statin   . Hypertension   . Polysubstance abuse    a. remote history of crack/cocaine abuse approximately 8-10 years ago, ongoing tobacco abuse since age 55, rare etoh abuse    Patient Active Problem List   Diagnosis Date Noted  . CAD (coronary artery disease)   . Polysubstance abuse   . Hypertension   . Asthma   . Coronary vasospasm ([place])   . NSTEMI (non-ST elevated myocardial infarction) ([place]) 11/25/2014  . Chest pain 11/24/2014    Past Surgical History:  Procedure Laterality Date  . CARDIAC CATHETERIZATION N/A 11/25/2014   Procedure: Left Heart Cath and Coronary Angiography;  Surgeon: Minna Merritts, MD;  Location: [place] [facility];  Service: Cardiovascular;  Laterality: N/A;    Prior to Admission medications   Medication Sig Start Date End Date Taking? Authorizing Provider  albuterol (PROVENTIL HFA;VENTOLIN HFA) 108 (90 BASE) MCG/ACT inhaler Inhale 2 puffs into the lungs every 6 (six) hours as needed for wheezing or shortness of breath. 11/25/14   Gladstone Lighter, MD  albuterol (PROVENTIL) (2.5 MG/3ML) 0.083% nebulizer solution Take 3 mLs (2.5 mg total) by nebulization every 6 (six) hours as needed for wheezing or shortness of breath. 11/25/14   Gladstone Lighter, MD  aspirin EC 81 MG EC tablet Take 1 tablet (81 mg total) by mouth daily. 11/25/14   Gladstone Lighter, MD  cetirizine (ZYRTEC) 10 MG tablet Take 10 mg by mouth daily as needed for allergies.     Historical Provider, MD  diltiazem (CARDIZEM CD) 120 MG 24 hr capsule Take 1 capsule (120 mg total) by mouth daily. 11/25/14   Gladstone Lighter, MD  Fluticasone-Salmeterol (ADVAIR) 250-50 MCG/DOSE AEPB Inhale 1 puff into the lungs 2 (two) times daily. 11/25/14   Gladstone Lighter, MD  isosorbide mononitrate (IMDUR) 30 MG 24 hr tablet Take 0.5 tablets (15 mg total) by mouth daily. 11/25/14   Gladstone Lighter, MD  lisinopril (PRINIVIL,ZESTRIL) 10 MG tablet Take 10 mg by mouth daily.    Historical Provider, MD  nitroGLYCERIN (NITROSTAT) 0.4 MG SL tablet Place 1 tablet (0.4 mg total) under the tongue every 5 (five) minutes as needed for chest pain. 11/25/14   Gladstone Lighter, MD    Allergies  Patient has no known allergies.  Family History  Problem Relation Age of Onset  . Breast cancer Mother 78  . CAD Father 65    Social History Social History  Substance Use Topics  . Smoking status: Current Every Day Smoker    Packs/day: 1.00  . Smokeless tobacco: Never Used  . Alcohol use Yes    Review of Systems  Constitutional: No fever/chills Eyes: No  visual changes. No discharge ENT: No upper respiratory complaints. Cardiovascular: no chest pain. Respiratory: no cough. No SOB. Gastrointestinal: No abdominal pain. No nausea, no vomiting.  No diarrhea.  No constipation. Genitourinary: Negative for dysuria. No hematuria Musculoskeletal: Negative for musculoskeletal pain. Skin: Patient has venous stasis ulcers.  Neurological: Negative for headaches, focal weakness or numbness. ____________________________________________   PHYSICAL EXAM:  VITAL SIGNS: ED Triage Vitals  Enc Vitals Group     BP 09/30/16 1734 (!) 185/90     Pulse Rate 09/30/16 1734 71     Resp 09/30/16 1734 18     Temp 09/30/16 1734 97.8 F (36.6 C)     Temp Source 09/30/16 1734 Oral     SpO2 09/30/16 1734 100 %     Weight 09/30/16 1734 215 lb (97.5 kg)     Height 09/30/16 1734 5\' 8"  (1.727 m)     Head Circumference --      Peak Flow --      Pain Score 09/30/16 1735 10     Pain Loc --      Pain Edu? --      Excl. in [place]? --     Constitutional: Alert and oriented. Well appearing and in no acute distress. Eyes: Conjunctivae are normal. PERRL. EOMI. Head: Atraumatic. Cardiovascular: Normal rate, regular rhythm. Normal S1 and S2.  Good peripheral circulation. Respiratory: Normal respiratory effort without tachypnea or retractions. Lungs CTAB. Good air entry to the bases with no decreased or absent breath sounds. Gastrointestinal: Bowel sounds 4 quadrants. Soft and nontender to palpation. No guarding or rigidity. No palpable masses. No distention. No CVA tenderness. Musculoskeletal: Full range of motion to all extremities. No gross deformities appreciated. Neurologic:  Normal speech and language. No gross focal neurologic deficits are appreciated.  Skin:  Patient has 2 regions approximately 1-1/2 cm apart of superficial ulceration with associated atrophie blanche and irregular skin edges of the skin overlying the dorsal right calf.  Psychiatric: Mood and affect are  normal. Speech and behavior are normal. Patient exhibits appropriate insight and judgement.   ____________________________________________   LABS (all labs ordered are listed, but only abnormal results are displayed)  Labs Reviewed - No data to display ____________________________________________  EKG   ____________________________________________  RADIOLOGY Unk Pinto, personally viewed and evaluated these images as part of my medical decision making, as well as reviewing the written report by the radiologist.    US Venous Img Lower Unilateral Right  Result Date: 09/30/2016 CLINICAL DATA:  RIGHT lower extremity swelling RIGHT calf swelling and redness EXAM: RIGHT LOWER EXTREMITY VENOUS DOPPLER ULTRASOUND TECHNIQUE: Gray-scale sonography with graded compression, as well as color Doppler and duplex ultrasound were performed to evaluate the lower extremity deep venous systems from the level of the common femoral vein and including the common femoral, femoral, profunda femoral, popliteal and calf veins including the posterior tibial, peroneal and gastrocnemius veins when visible. The superficial great saphenous vein was also interrogated. Spectral Doppler was utilized to evaluate flow at rest and with distal augmentation maneuvers in the common femoral, femoral  and popliteal veins. COMPARISON:  None. FINDINGS: Contralateral Common Femoral Vein: Respiratory phasicity is normal and symmetric with the symptomatic side. No evidence of thrombus. Normal compressibility. Common Femoral Vein: No evidence of thrombus. Normal compressibility, respiratory phasicity and response to augmentation. Saphenofemoral Junction: No evidence of thrombus. Normal compressibility and flow on color Doppler imaging. Profunda Femoral Vein: No evidence of thrombus. Normal compressibility and flow on color Doppler imaging. Femoral Vein: No evidence of thrombus. Normal compressibility, respiratory phasicity and response  to augmentation. Popliteal Vein: No evidence of thrombus. Normal compressibility, respiratory phasicity and response to augmentation. Calf Veins: No evidence of thrombus. Normal compressibility and flow on color Doppler imaging. Superficial Great Saphenous Vein: No evidence of thrombus. Normal compressibility and flow on color Doppler imaging. IMPRESSION: No evidence of RIGHT deep venous thrombosis. Electronically Signed   By: Suzy Bouchard M.D.   On: 09/30/2016 20:08    ____________________________________________    PROCEDURES  Procedure(s) performed:    Procedures    Medications - No data to display   ____________________________________________   INITIAL IMPRESSION / ASSESSMENT AND PLAN / ED COURSE  Pertinent labs & imaging results that were available during my care of the patient were reviewed by me and considered in my medical decision making (see chart for details).  Review of the [place] CSRS was performed in accordance of the [place] prior to dispensing any controlled drugs.     Assessment and Plan:  Venous Stasis Ulcers: Patient presents to the emergency department with right lower extremity edema, atrophie blanche and superficial ulceration with irregular edges of the skin overlying the dorsal right shank. Ultrasound of the right lower extremity was noncontributory for thromboembolism. Physical exam findings are consistent with venous stasis ulcers. Patient was discharged with compression stockings and intermittent leg elevation was recommended for periods of thirty minutes, 3-4 times per day. Patient was advised to follow-up with her primary care provider. Vital signs are reassuring at this time. All patient questions were answered.    ____________________________________________  FINAL CLINICAL IMPRESSION(S) / ED DIAGNOSES  Final diagnoses:  Venous stasis ulcer of right calf limited to breakdown of skin without varicose veins (HCC)      NEW MEDICATIONS STARTED  DURING THIS VISIT:  New Prescriptions   No medications on file        This chart was dictated using voice recognition software/Dragon. Despite best efforts to proofread, errors can occur which can change the meaning. Any change was purely unintentional.    Lannie Fields, PA-C 09/30/16 2032    Hinda Kehr, MD 10/01/16 [phone]

## 2016-09-30 NOTE — ED Notes (Signed)
Pt in u/s

## 2016-09-30 NOTE — ED Notes (Signed)
Pt states she did not take her bp meds today and will take as soon as she gets home

## 2016-11-03 ENCOUNTER — Emergency Department
Admission: EM | Admit: 2016-11-03 | Discharge: 2016-11-03 | Disposition: A | Discharge status: Home / Self Care | Payer: Medicaid Other | Attending: Emergency Medicine | Admitting: Emergency Medicine

## 2016-11-03 ENCOUNTER — Emergency Department: Payer: Medicaid Other

## 2016-11-03 DIAGNOSIS — I2510 Atherosclerotic heart disease of native coronary artery without angina pectoris: Secondary | ICD-10-CM | POA: Insufficient documentation

## 2016-11-03 DIAGNOSIS — K2900 Acute gastritis without bleeding: Secondary | ICD-10-CM | POA: Insufficient documentation

## 2016-11-03 DIAGNOSIS — Z79899 Other long term (current) drug therapy: Secondary | ICD-10-CM | POA: Diagnosis not present

## 2016-11-03 DIAGNOSIS — E860 Dehydration: Secondary | ICD-10-CM

## 2016-11-03 DIAGNOSIS — J45909 Unspecified asthma, uncomplicated: Secondary | ICD-10-CM | POA: Diagnosis not present

## 2016-11-03 DIAGNOSIS — I10 Essential (primary) hypertension: Secondary | ICD-10-CM | POA: Diagnosis not present

## 2016-11-03 DIAGNOSIS — Z760 Encounter for issue of repeat prescription: Secondary | ICD-10-CM | POA: Insufficient documentation

## 2016-11-03 DIAGNOSIS — R079 Chest pain, unspecified: Secondary | ICD-10-CM

## 2016-11-03 DIAGNOSIS — F17200 Nicotine dependence, unspecified, uncomplicated: Secondary | ICD-10-CM | POA: Diagnosis not present

## 2016-11-03 LAB — CBC
HCT: 50.3 % — ABNORMAL HIGH (ref 35.0–47.0)
Hemoglobin: 17 g/dL — ABNORMAL HIGH (ref 12.0–16.0)
MCH: 27.8 pg (ref 26.0–34.0)
MCHC: 33.8 g/dL (ref 32.0–36.0)
MCV: 82.3 fL (ref 80.0–100.0)
Platelets: 262 10*3/uL (ref 150–440)
RBC: 6.12 MIL/uL — ABNORMAL HIGH (ref 3.80–5.20)
RDW: 13.7 % (ref 11.5–14.5)
WBC: 6.8 10*3/uL (ref 3.6–11.0)

## 2016-11-03 LAB — TROPONIN I
Troponin I: 0.04 ng/mL (ref <0.03)
Troponin I: <0.03 ng/mL (ref <0.03)

## 2016-11-03 LAB — BASIC METABOLIC PANEL
Anion gap: 10 (ref 5–15)
BUN: 23 mg/dL — ABNORMAL HIGH (ref 6–20)
CO2: 25 mmol/L (ref 22–32)
Calcium: 10.3 mg/dL (ref 8.9–10.3)
Chloride: 98 mmol/L — ABNORMAL LOW (ref 101–111)
Creatinine, Ser: 1.48 mg/dL — ABNORMAL HIGH (ref 0.44–1.00)
GFR calc Af Amer: 45 mL/min — ABNORMAL LOW (ref >60)
GFR calc non Af Amer: 38 mL/min — ABNORMAL LOW (ref >60)
Glucose, Bld: 130 mg/dL — ABNORMAL HIGH (ref 65–99)
Potassium: 3.2 mmol/L — ABNORMAL LOW (ref 3.5–5.1)
Sodium: 133 mmol/L — ABNORMAL LOW (ref 135–145)

## 2016-11-03 MED ORDER — METOCLOPRAMIDE HCL 10 MG PO TABS: 10.0000 mg | ORAL_TABLET | Freq: Four times a day (QID) | ORAL | 0 refills | Status: DC | PRN

## 2016-11-03 MED ORDER — ONDANSETRON HCL 4 MG/2ML IJ SOLN
4.0000 mg | Freq: Once | INTRAMUSCULAR | Status: AC
  Administered 2016-11-03: 4 mg via INTRAVENOUS

## 2016-11-03 MED ORDER — AMLODIPINE BESYLATE 5 MG PO TABS: 5.0000 mg | ORAL_TABLET | Freq: Every day | ORAL | 1 refills | Status: DC

## 2016-11-03 MED ORDER — ONDANSETRON 4 MG PO TBDP: 4.0000 mg | ORAL_TABLET | Freq: Three times a day (TID) | ORAL | 0 refills | Status: DC | PRN

## 2016-11-03 MED ORDER — METOCLOPRAMIDE HCL 5 MG/ML IJ SOLN
10.0000 mg | Freq: Once | INTRAMUSCULAR | Status: AC
  Administered 2016-11-03: 10 mg via INTRAVENOUS
  Filled 2016-11-03: qty 2

## 2016-11-03 MED ORDER — NITROGLYCERIN 0.4 MG SL SUBL
0.4000 mg | SUBLINGUAL_TABLET | SUBLINGUAL | Status: DC | PRN
  Administered 2016-11-03: 0.4 mg via SUBLINGUAL

## 2016-11-03 MED ORDER — ONDANSETRON HCL 4 MG/2ML IJ SOLN
INTRAMUSCULAR | Status: AC
  Administered 2016-11-03: 4 mg via INTRAVENOUS
  Filled 2016-11-03: qty 2

## 2016-11-03 MED ORDER — FAMOTIDINE 20 MG PO TABS: 20.0000 mg | ORAL_TABLET | Freq: Two times a day (BID) | ORAL | 0 refills | Status: DC

## 2016-11-03 MED ORDER — SODIUM CHLORIDE 0.9 % IV BOLUS (SEPSIS)
1000.0000 mL | Freq: Once | INTRAVENOUS | Status: AC
  Administered 2016-11-03: 1000 mL via INTRAVENOUS

## 2016-11-03 MED ORDER — ISOSORBIDE MONONITRATE ER 60 MG PO TB24
30.0000 mg | ORAL_TABLET | ORAL | Status: AC
  Administered 2016-11-03: 30 mg via ORAL
  Filled 2016-11-03: qty 1

## 2016-11-03 MED ORDER — NITROGLYCERIN 0.4 MG SL SUBL
SUBLINGUAL_TABLET | SUBLINGUAL | Status: AC
  Administered 2016-11-03: 0.4 mg via SUBLINGUAL
  Filled 2016-11-03: qty 1

## 2016-11-03 MED ORDER — ISOSORBIDE MONONITRATE ER 30 MG PO TB24: 30.0000 mg | ORAL_TABLET | Freq: Every day | ORAL | 0 refills | Status: DC

## 2016-11-03 MED ORDER — DIPHENHYDRAMINE HCL 50 MG/ML IJ SOLN
25.0000 mg | Freq: Once | INTRAMUSCULAR | Status: AC
Start: 2016-11-03 — End: 2016-11-03
  Administered 2016-11-03: 25 mg via INTRAVENOUS
  Filled 2016-11-03: qty 1

## 2016-11-03 NOTE — ED Triage Notes (Signed)
Pt from home c/o CP and nausea x2 days.  Pt states the pain is to her L chest.  Pt brought in by [facility].  Pt states that the pain is worse with palpation.  Pt reports being out of her BP medications and nitro x1 month.  Pt alert & oriented.

## 2016-11-03 NOTE — ED Notes (Signed)
Pt. Used hospital phone to call for ride.

## 2016-11-03 NOTE — ED Notes (Signed)
Pt vomited again, unable to tolerate PO at this time.

## 2016-11-03 NOTE — ED Provider Notes (Signed)
[facility] Emergency Department Provider Note  ____________________________________________  Time seen: Approximately 5:08 PM  I have reviewed the triage vital signs and the nursing notes.   HISTORY  Chief Complaint Chest Pain    HPI Anne [name] is a 56 y.o. female who complains of nausea and left chest pain for the past 2 days. Reports that it is intermittent, lasting a few seconds to a minute at a time. Hurts to change position. Hurts to press on that area. No shortness of breath radiation diaphoresis or vomiting. Not exertional, not pleuritic.  Or so she has been out of all her medicines for the past 2 years and has not seen a doctor in this time. This includes lisinopril diltiazem Imdur and nitroglycerin.  Was in the hospital with non-STEMI in May 2016, found to be coronary vasospasm with relatively unremarkable cardiac catheterization.     Past Medical History:  Diagnosis Date  . Asthma   . CAD (coronary artery disease)    a. cardiac cath 02/04/2013: tubular 20% stenosis pRCA, tubular 30% stenosis mRCA, medically managed  . Coronary vasospasm (HCC)    a. cardiac cath 11/25/2014: mLCx 30%, pRCA 30%, moderate spasm w/ noted improvement w/ reimaging, recommended CCB, long acting nitrate, smoking cessation, & statin   . Hypertension   . Polysubstance abuse    a. remote history of crack/cocaine abuse approximately 8-10 years ago, ongoing tobacco abuse since age 31, rare etoh abuse     Patient Active Problem List   Diagnosis Date Noted  . CAD (coronary artery disease)   . Polysubstance abuse   . Hypertension   . Asthma   . Coronary vasospasm ([place])   . NSTEMI (non-ST elevated myocardial infarction) ([place]) 11/25/2014  . Chest pain 11/24/2014     Past Surgical History:  Procedure Laterality Date  . CARDIAC CATHETERIZATION N/A 11/25/2014   Procedure: Left Heart Cath and Coronary Angiography;  Surgeon: Minna Merritts, MD;  Location: [place] [facility];  Service: Cardiovascular;  Laterality: N/A;     Prior to Admission medications   Medication Sig Start Date End Date Taking? Authorizing Provider  albuterol (PROVENTIL HFA;VENTOLIN HFA) 108 (90 BASE) MCG/ACT inhaler Inhale 2 puffs into the lungs every 6 (six) hours as needed for wheezing or shortness of breath. 11/25/14  Yes Gladstone Lighter, MD  albuterol (PROVENTIL) (2.5 MG/3ML) 0.083% nebulizer solution Take 3 mLs (2.5 mg total) by nebulization every 6 (six) hours as needed for wheezing or shortness of breath. 11/25/14  Yes Gladstone Lighter, MD  aspirin EC 81 MG EC tablet Take 1 tablet (81 mg total) by mouth daily. Patient not taking: Reported on 11/03/2016 11/25/14   Gladstone Lighter, MD  cetirizine (ZYRTEC) 10 MG tablet Take 10 mg by mouth daily as needed for allergies.     Historical Provider, MD  diltiazem (CARDIZEM CD) 120 MG 24 hr capsule Take 1 capsule (120 mg total) by mouth daily. Patient not taking: Reported on 11/03/2016 11/25/14   Gladstone Lighter, MD  esomeprazole (NEXIUM) 20 MG capsule Take 20 mg by mouth daily at 12 noon.    Historical Provider, MD  famotidine (PEPCID) 20 MG tablet Take 1 tablet (20 mg total) by mouth 2 (two) times daily. 11/03/16   Carrie Mew, MD  Fluticasone-Salmeterol (ADVAIR) 250-50 MCG/DOSE AEPB Inhale 1 puff into the lungs 2 (two) times daily. Patient not taking: Reported on 11/03/2016 11/25/14   Gladstone Lighter, MD  isosorbide mononitrate (IMDUR) 30 MG 24 hr tablet Take 0.5 tablets (  15 mg total) by mouth daily. Patient not taking: Reported on 11/03/2016 11/25/14   Gladstone Lighter, MD  lisinopril (PRINIVIL,ZESTRIL) 10 MG tablet Take 10 mg by mouth daily.    Historical Provider, MD  metoCLOPramide (REGLAN) 10 MG tablet Take 1 tablet (10 mg total) by mouth every 6 (six) hours as needed. 11/03/16   Carrie Mew, MD  nitroGLYCERIN (NITROSTAT) 0.4 MG SL tablet Place 1 tablet (0.4 mg total) under the tongue every 5 (five) minutes as needed  for chest pain. Patient not taking: Reported on 11/03/2016 11/25/14   Gladstone Lighter, MD  ondansetron (ZOFRAN ODT) 4 MG disintegrating tablet Take 1 tablet (4 mg total) by mouth every 8 (eight) hours as needed for nausea or vomiting. 11/03/16   Carrie Mew, MD     Allergies Patient has no known allergies.   Family History  Problem Relation Age of Onset  . Breast cancer Mother 61  . CAD Father 101    Social History Social History  Substance Use Topics  . Smoking status: Current Every Day Smoker    Packs/day: 1.00  . Smokeless tobacco: Never Used  . Alcohol use Yes    Review of Systems  Constitutional:   No fever or chills.  ENT:   No sore throat. No rhinorrhea. Cardiovascular:   Positive as above chest pain. Respiratory:   No dyspnea or cough. Gastrointestinal:   Negative for abdominal pain, vomiting and diarrhea.  Genitourinary:   Negative for dysuria or difficulty urinating. Musculoskeletal:   Negative for focal pain or swelling Neurological:   Negative for headaches 10-point ROS otherwise negative.  ____________________________________________   PHYSICAL EXAM:  VITAL SIGNS: ED Triage Vitals  Enc Vitals Group     BP 11/03/16 1524 (!) 185/99     Pulse Rate 11/03/16 1524 81     Resp 11/03/16 1524 18     Temp 11/03/16 1524 98 F (36.7 C)     Temp Source 11/03/16 1524 Oral     SpO2 11/03/16 1524 97 %     Weight 11/03/16 1526 215 lb (97.5 kg)     Height 11/03/16 1526 5\' 6"  (1.676 m)     Head Circumference --      Peak Flow --      Pain Score 11/03/16 1524 10     Pain Loc --      Pain Edu? --      Excl. in [place]? --     Vital signs reviewed, nursing assessments reviewed.   Constitutional:   Alert and oriented. Well appearing and in no distress. Eyes:   No scleral icterus. No conjunctival pallor. PERRL. EOMI.  No nystagmus. ENT   Head:   Normocephalic and atraumatic.   Nose:   No congestion/rhinnorhea. No septal hematoma   Mouth/Throat:   MMM,  no pharyngeal erythema. No peritonsillar mass.    Neck:   No stridor. No SubQ emphysema. No meningismus. Hematological/Lymphatic/Immunilogical:   No cervical lymphadenopathy. Cardiovascular:   RRR. Symmetric bilateral radial and DP pulses.  No murmurs.  Respiratory:   Normal respiratory effort without tachypnea nor retractions. Breath sounds are clear and equal bilaterally. No wheezes/rales/rhonchi.Chest wall tender to the touch in the third intercostal space on the left anteriorly, reproducing her pain Gastrointestinal:   Soft and nontender. Non distended. There is no CVA tenderness.  No rebound, rigidity, or guarding. Genitourinary:   deferred Musculoskeletal:   Normal range of motion in all extremities. No joint effusions.  No lower extremity tenderness.  No edema.  Neurologic:   Normal speech and language.  CN 2-10 normal. Motor grossly intact. No gross focal neurologic deficits are appreciated.  Skin:    Skin is warm, dry and intact. No rash noted.  No petechiae, purpura, or bullae.  ____________________________________________    LABS (pertinent positives/negatives) (all labs ordered are listed, but only abnormal results are displayed) Labs Reviewed  BASIC METABOLIC PANEL - Abnormal; Notable for the following:       Result Value   Sodium 133 (*)    Potassium 3.2 (*)    Chloride 98 (*)    Glucose, Bld 130 (*)    BUN 23 (*)    Creatinine, Ser 1.48 (*)    GFR calc non Af Amer 38 (*)    GFR calc Af Amer 45 (*)    All other components within normal limits  CBC - Abnormal; Notable for the following:    RBC 6.12 (*)    Hemoglobin 17.0 (*)    HCT 50.3 (*)    All other components within normal limits  TROPONIN I - Abnormal; Notable for the following:    Troponin I 0.04 (*)    All other components within normal limits  TROPONIN I   ____________________________________________   EKG  Interpreted by me Sinus rhythm rate of 85, normal axis intervals QRS and ST segments.  Normal T waves. ____________________________________________    RADIOLOGY  Dg Chest 2 View  Result Date: 11/03/2016 CLINICAL DATA:  Left chest pain and nausea for the past 2 days, worse with palpation. Smoker. EXAM: CHEST  2 VIEW COMPARISON:  11/24/2014 and chest CT dated 12/14/2014. FINDINGS: The cardiac silhouette remains borderline enlarged. Clear lungs. Mild diffuse peribronchial thickening and accentuation of the interstitial markings. Stable 60% L1 compression deformity with acute kyphosis. IMPRESSION: No acute abnormality.  Mild chronic bronchitic changes. Electronically Signed   By: Claudie Revering M.D.   On: 11/03/2016 16:11    ____________________________________________   PROCEDURES Procedures  ____________________________________________   INITIAL IMPRESSION / ASSESSMENT AND PLAN / ED COURSE  Pertinent labs & imaging results that were available during my care of the patient were reviewed by me and considered in my medical decision making (see chart for details).     Clinical Course as of Nov 04 2023  Sat Nov 03, 2016  1539 P/w msk chest wall pain. Also has uncontrolled htn due to med noncompliance. Will provide rx refill, check labs, cxr for screening. Encouraged to follow up with primary care.   [PS]  1610 Labs c/w hemoconcentration. Ivf. Check delta trop.   [PS]    Clinical Course User Index [PS] Carrie Mew, MD     ----------------------------------------- 8:25 PM on 11/03/2016 -----------------------------------------  Second troponin negative. We'll restart on antihypertensive, represcribed her Imdur. Defer further medications to primary care as I think she'll likely need readjustment. An cannot restart all her medications at once.  ____________________________________________   FINAL CLINICAL IMPRESSION(S) / ED DIAGNOSES  Final diagnoses:  Nonspecific chest pain  Acute gastritis without hemorrhage, unspecified gastritis type  Dehydration   Encounter for medication refill    New Prescriptions   FAMOTIDINE (PEPCID) 20 MG TABLET    Take 1 tablet (20 mg total) by mouth 2 (two) times daily.   METOCLOPRAMIDE (REGLAN) 10 MG TABLET    Take 1 tablet (10 mg total) by mouth every 6 (six) hours as needed.   ONDANSETRON (ZOFRAN ODT) 4 MG DISINTEGRATING TABLET    Take 1 tablet (4 mg total) by mouth every 8 (  eight) hours as needed for nausea or vomiting.     Portions of this note were generated with dragon dictation software. Dictation errors may occur despite best attempts at proofreading.    Carrie Mew, MD 11/03/16 2025

## 2016-11-14 DIAGNOSIS — G4700 Insomnia, unspecified: Secondary | ICD-10-CM | POA: Insufficient documentation

## 2017-02-01 ENCOUNTER — Other Ambulatory Visit: Payer: Self-pay | Admitting: Family Medicine

## 2017-02-04 ENCOUNTER — Other Ambulatory Visit: Payer: Self-pay | Admitting: Family Medicine

## 2017-02-04 DIAGNOSIS — Z1231 Encounter for screening mammogram for malignant neoplasm of breast: Secondary | ICD-10-CM

## 2017-04-04 ENCOUNTER — Ambulatory Visit
Admission: RE | Admit: 2017-04-04 | Discharge: 2017-04-04 | Disposition: A | Discharge status: Home / Self Care | Payer: Medicaid Other | Source: Ambulatory Visit | Attending: Family Medicine | Admitting: Family Medicine

## 2017-04-04 DIAGNOSIS — Z1231 Encounter for screening mammogram for malignant neoplasm of breast: Secondary | ICD-10-CM | POA: Insufficient documentation

## 2017-12-31 ENCOUNTER — Telehealth: Payer: Self-pay | Admitting: *Deleted

## 2017-12-31 NOTE — Telephone Encounter (Signed)
Received referral for low dose lung cancer screening CT scan.Attempted to leave Message at phone number listed in EMR for patient to call me back to facilitate scheduling scan. However, this option is not available. Will call back at later date.

## 2018-01-02 ENCOUNTER — Telehealth: Payer: Self-pay | Admitting: *Deleted

## 2018-01-02 DIAGNOSIS — Z87891 Personal history of nicotine dependence: Secondary | ICD-10-CM

## 2018-01-02 DIAGNOSIS — Z122 Encounter for screening for malignant neoplasm of respiratory organs: Secondary | ICD-10-CM

## 2018-01-02 NOTE — Telephone Encounter (Signed)
Received referral for initial lung cancer screening scan. Contacted patient and obtained smoking history,(current, 45 pack years) as well as answering questions related to screening process. Patient denies signs of lung cancer such as weight loss or hemoptysis. Patient denies comorbidity that would prevent curative treatment if lung cancer were found. Patient is scheduled for shared decision making visit and CT scan on 01/09/18.

## 2018-01-09 ENCOUNTER — Encounter: Payer: Self-pay | Admitting: Nurse Practitioner

## 2018-01-09 ENCOUNTER — Encounter (INDEPENDENT_AMBULATORY_CARE_PROVIDER_SITE_OTHER): Payer: Self-pay

## 2018-01-09 ENCOUNTER — Inpatient Hospital Stay: Payer: Medicaid Other | Attending: Nurse Practitioner | Admitting: Nurse Practitioner

## 2018-01-09 ENCOUNTER — Ambulatory Visit
Admission: RE | Admit: 2018-01-09 | Discharge: 2018-01-09 | Disposition: A | Discharge status: Home / Self Care | Payer: Medicaid Other | Source: Ambulatory Visit | Attending: Nurse Practitioner | Admitting: Nurse Practitioner

## 2018-01-09 DIAGNOSIS — Z8673 Personal history of transient ischemic attack (TIA), and cerebral infarction without residual deficits: Secondary | ICD-10-CM

## 2018-01-09 DIAGNOSIS — Z87891 Personal history of nicotine dependence: Secondary | ICD-10-CM

## 2018-01-09 DIAGNOSIS — I700 Atherosclerosis of aorta: Secondary | ICD-10-CM | POA: Insufficient documentation

## 2018-01-09 DIAGNOSIS — D509 Iron deficiency anemia, unspecified: Secondary | ICD-10-CM

## 2018-01-09 DIAGNOSIS — Z122 Encounter for screening for malignant neoplasm of respiratory organs: Secondary | ICD-10-CM | POA: Insufficient documentation

## 2018-01-09 DIAGNOSIS — E119 Type 2 diabetes mellitus without complications: Secondary | ICD-10-CM

## 2018-01-09 DIAGNOSIS — M109 Gout, unspecified: Secondary | ICD-10-CM

## 2018-01-09 DIAGNOSIS — Z7982 Long term (current) use of aspirin: Secondary | ICD-10-CM

## 2018-01-09 DIAGNOSIS — J432 Centrilobular emphysema: Secondary | ICD-10-CM | POA: Insufficient documentation

## 2018-01-09 DIAGNOSIS — G4730 Sleep apnea, unspecified: Secondary | ICD-10-CM

## 2018-01-09 DIAGNOSIS — J438 Other emphysema: Secondary | ICD-10-CM | POA: Insufficient documentation

## 2018-01-09 DIAGNOSIS — J45909 Unspecified asthma, uncomplicated: Secondary | ICD-10-CM

## 2018-01-09 DIAGNOSIS — E785 Hyperlipidemia, unspecified: Secondary | ICD-10-CM

## 2018-01-09 DIAGNOSIS — I669 Occlusion and stenosis of unspecified cerebral artery: Secondary | ICD-10-CM

## 2018-01-09 DIAGNOSIS — E279 Disorder of adrenal gland, unspecified: Secondary | ICD-10-CM | POA: Diagnosis not present

## 2018-01-09 DIAGNOSIS — I2510 Atherosclerotic heart disease of native coronary artery without angina pectoris: Secondary | ICD-10-CM | POA: Insufficient documentation

## 2018-01-09 DIAGNOSIS — R0602 Shortness of breath: Secondary | ICD-10-CM

## 2018-01-09 DIAGNOSIS — D696 Thrombocytopenia, unspecified: Secondary | ICD-10-CM

## 2018-01-09 DIAGNOSIS — Z79899 Other long term (current) drug therapy: Secondary | ICD-10-CM

## 2018-01-09 DIAGNOSIS — I10 Essential (primary) hypertension: Secondary | ICD-10-CM

## 2018-01-09 DIAGNOSIS — E538 Deficiency of other specified B group vitamins: Secondary | ICD-10-CM

## 2018-01-09 DIAGNOSIS — I509 Heart failure, unspecified: Secondary | ICD-10-CM

## 2018-01-09 DIAGNOSIS — R531 Weakness: Secondary | ICD-10-CM

## 2018-01-09 DIAGNOSIS — K219 Gastro-esophageal reflux disease without esophagitis: Secondary | ICD-10-CM

## 2018-01-09 DIAGNOSIS — D72829 Elevated white blood cell count, unspecified: Secondary | ICD-10-CM

## 2018-01-09 DIAGNOSIS — G629 Polyneuropathy, unspecified: Secondary | ICD-10-CM

## 2018-01-09 DIAGNOSIS — R5383 Other fatigue: Secondary | ICD-10-CM

## 2018-01-09 NOTE — Progress Notes (Signed)
In accordance with CMS guidelines, patient has met eligibility criteria including age, absence of signs or symptoms of lung cancer.  Social History   Tobacco Use  . Smoking status: Current Every Day Smoker    Packs/day: 1.00    Years: 45.00    Pack years: 45.00  . Smokeless tobacco: Never Used  Substance Use Topics  . Alcohol use: Yes  . Drug use: No      A shared decision-making session was conducted prior to the performance of CT scan. This includes one or more decision aids, includes benefits and harms of screening, follow-up diagnostic testing, over-diagnosis, false positive rate, and total radiation exposure.   Counseling on the importance of adherence to annual lung cancer LDCT screening, impact of co-morbidities, and ability or willingness to undergo diagnosis and treatment is imperative for compliance of the program.   Counseling on the importance of continued smoking cessation for former smokers; the importance of smoking cessation for current smokers, and information about tobacco cessation interventions have been given to patient including [place] [facility]uit [facility] and [employer] quit [place] programs.   Written order for lung cancer screening with LDCT has been given to the patient and any and all questions have been answered to the best of my abilities.    Yearly follow up will be coordinated by Shawn Perkins, Thoracic Navigator.   , DNP, AGNP-C [facility] [phone] (work cell) [phone] (office) 01/09/18 3:08 PM   

## 2018-01-13 ENCOUNTER — Telehealth: Payer: Self-pay | Admitting: *Deleted

## 2018-01-13 NOTE — Telephone Encounter (Signed)
Notified patient of LDCT lung cancer screening program results with recommendation for 3 month follow up imaging. Also notified of incidental findings noted below and is encouraged to discuss further with PCP who will receive a copy of this note and/or the CT report. Patient verbalizes understanding.  Also, faxed results and left message with receptionist as nurse and physician were not available.  IMPRESSION: 1. Lung-RADS 0S, incomplete. Repeat low-dose chest CT without contrast is recommended in 3 months after trial of antimicrobial therapy. 2. The "S" modifier above refers to potentially clinically significant non lung cancer related findings. Specifically, there is aortic atherosclerosis, in addition to 2 vessel coronary artery disease. Please note that although the presence of coronary artery calcium documents the presence of coronary artery disease, the severity of this disease and any potential stenosis cannot be assessed on this non-gated CT examination. Assessment for potential risk factor modification, dietary therapy or pharmacologic therapy may be warranted, if clinically indicated. 3. Mild diffuse bronchial wall thickening with mild centrilobular and paraseptal emphysema; imaging findings suggestive of underlying COPD. 4. 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  Aortic Atherosclerosis (ICD10-I70.0) and Emphysema (ICD10-J43.9).

## 2018-02-24 ENCOUNTER — Other Ambulatory Visit: Payer: Self-pay | Admitting: Family Medicine

## 2018-02-24 DIAGNOSIS — Z1231 Encounter for screening mammogram for malignant neoplasm of breast: Secondary | ICD-10-CM

## 2018-03-29 ENCOUNTER — Telehealth: Payer: Self-pay

## 2018-03-29 NOTE — Telephone Encounter (Signed)
Call pt regarding lung screening. PT is a current smoker. Smokes about 1/2 pack per day. Would like scan in the afternoon after Sept. 24th.

## 2018-03-31 ENCOUNTER — Other Ambulatory Visit: Payer: Self-pay | Admitting: *Deleted

## 2018-03-31 DIAGNOSIS — Z122 Encounter for screening for malignant neoplasm of respiratory organs: Secondary | ICD-10-CM

## 2018-04-01 ENCOUNTER — Telehealth: Payer: Self-pay | Admitting: *Deleted

## 2018-04-01 NOTE — Telephone Encounter (Signed)
Called pt to inform her of her ldct appt for Monday 04/14/2018 @ 4:15pm here @ [facility] Voiced understanding.  Appointment mailed to patient per pt request.

## 2018-04-14 ENCOUNTER — Ambulatory Visit: Payer: Medicaid Other | Attending: Oncology

## 2018-04-15 ENCOUNTER — Ambulatory Visit
Admission: RE | Admit: 2018-04-15 | Discharge: 2018-04-15 | Disposition: A | Discharge status: Home / Self Care | Payer: Medicaid Other | Source: Ambulatory Visit | Attending: Family Medicine | Admitting: Family Medicine

## 2018-04-15 ENCOUNTER — Other Ambulatory Visit
Admission: RE | Admit: 2018-04-15 | Discharge: 2018-04-15 | Disposition: A | Discharge status: Home / Self Care | Payer: Medicaid Other | Source: Ambulatory Visit | Attending: Family Medicine | Admitting: Family Medicine

## 2018-04-15 ENCOUNTER — Other Ambulatory Visit: Payer: Self-pay | Admitting: Family Medicine

## 2018-04-15 ENCOUNTER — Telehealth: Payer: Self-pay | Admitting: *Deleted

## 2018-04-15 DIAGNOSIS — Z1231 Encounter for screening mammogram for malignant neoplasm of breast: Secondary | ICD-10-CM | POA: Insufficient documentation

## 2018-04-15 DIAGNOSIS — R918 Other nonspecific abnormal finding of lung field: Secondary | ICD-10-CM | POA: Insufficient documentation

## 2018-04-15 DIAGNOSIS — I25118 Atherosclerotic heart disease of native coronary artery with other forms of angina pectoris: Secondary | ICD-10-CM | POA: Insufficient documentation

## 2018-04-15 NOTE — Telephone Encounter (Signed)
Attempted to contact patient r/t LDCT Screening follow up due at this time.  No answer received, message left for patient to call [phone] to schedule appointment.    

## 2018-04-16 ENCOUNTER — Telehealth: Payer: Self-pay | Admitting: *Deleted

## 2018-04-16 NOTE — Telephone Encounter (Signed)
Patient has been notified that the annual lung cancer screening low dose CT scan is due currently or will be in the near future.  Confirmed that the patient is within the age range of 15-80, and asymptomatic, and currently exhibits no signs or symptoms of lung cancer.  Patient denies illness that would prevent curative treatment for lung cancer if found.  Verified smoking history, 45 pkyr hisotry current smoker .  The shared decision making visit was completed on 01-09-18. Patient was a no show on 04-14-18 and reported that she forgot about the scan, will come if scheduled again.  Patient is agreeable for the CT scan to be scheduled.  Will call patient back with date and time of appointment.

## 2018-04-17 ENCOUNTER — Telehealth: Payer: Self-pay | Admitting: *Deleted

## 2018-04-17 NOTE — Telephone Encounter (Signed)
Called pt to inform her of her appt for ldct screening on Monday 04/21/2018 @ 1:45pm here @ [facility], message left for patient , appt mailed to patient

## 2018-04-21 ENCOUNTER — Ambulatory Visit: Payer: Medicaid Other | Attending: Oncology

## 2018-04-21 ENCOUNTER — Ambulatory Visit: Admission: RE | Admit: 2018-04-21 | Payer: Medicaid Other | Source: Ambulatory Visit

## 2018-04-26 ENCOUNTER — Telehealth: Payer: Self-pay

## 2018-04-26 NOTE — Telephone Encounter (Signed)
Call pt regarding lung screening. PT is a current smoker, smoking around 1 pack per day. Would like scan to be on Oct. 16th if possible in the A.M.

## 2018-04-28 ENCOUNTER — Telehealth: Payer: Self-pay | Admitting: *Deleted

## 2018-04-28 NOTE — Telephone Encounter (Signed)
Patient was a no show to last appt, patient called agrees to scan, will send to scheduling and call her back

## 2018-05-06 ENCOUNTER — Telehealth: Payer: Self-pay | Admitting: *Deleted

## 2018-05-06 NOTE — Telephone Encounter (Signed)
Called pt to inform of appt this Monday for ldct screening on 05-12-18 @ 0820  @ [facility] voiced understanding, appt mailed for reminder

## 2018-05-12 ENCOUNTER — Encounter: Payer: Self-pay | Admitting: *Deleted

## 2018-05-12 ENCOUNTER — Ambulatory Visit
Admission: RE | Admit: 2018-05-12 | Discharge: 2018-05-12 | Disposition: A | Discharge status: Home / Self Care | Payer: Medicaid Other | Source: Ambulatory Visit | Attending: Oncology | Admitting: Oncology

## 2018-05-12 DIAGNOSIS — E279 Disorder of adrenal gland, unspecified: Secondary | ICD-10-CM | POA: Diagnosis not present

## 2018-05-12 DIAGNOSIS — I700 Atherosclerosis of aorta: Secondary | ICD-10-CM | POA: Diagnosis not present

## 2018-05-12 DIAGNOSIS — Z122 Encounter for screening for malignant neoplasm of respiratory organs: Secondary | ICD-10-CM | POA: Insufficient documentation

## 2018-07-18 DIAGNOSIS — F1910 Other psychoactive substance abuse, uncomplicated: Secondary | ICD-10-CM | POA: Insufficient documentation

## 2019-03-09 ENCOUNTER — Other Ambulatory Visit: Payer: Self-pay | Admitting: Family Medicine

## 2019-03-09 DIAGNOSIS — Z1231 Encounter for screening mammogram for malignant neoplasm of breast: Secondary | ICD-10-CM

## 2019-04-20 ENCOUNTER — Ambulatory Visit
Admission: RE | Admit: 2019-04-20 | Discharge: 2019-04-20 | Disposition: A | Discharge status: Home / Self Care | Payer: Medicaid Other | Source: Ambulatory Visit | Attending: Family Medicine | Admitting: Family Medicine

## 2019-04-20 DIAGNOSIS — Z1231 Encounter for screening mammogram for malignant neoplasm of breast: Secondary | ICD-10-CM | POA: Diagnosis present

## 2019-05-18 ENCOUNTER — Telehealth: Payer: Self-pay | Admitting: *Deleted

## 2019-05-18 DIAGNOSIS — Z87891 Personal history of nicotine dependence: Secondary | ICD-10-CM

## 2019-05-18 DIAGNOSIS — Z122 Encounter for screening for malignant neoplasm of respiratory organs: Secondary | ICD-10-CM

## 2019-05-18 NOTE — Telephone Encounter (Signed)
Patient has been notified that annual lung cancer screening low dose CT scan is due currently or will be in near future. Confirmed that patient is within the age range of 55-77, and asymptomatic, (no signs or symptoms of lung cancer). Patient denies illness that would prevent curative treatment for lung cancer if found. Verified smoking history, (current, 46 pack year). The shared decision making visit was done 01/09/18. Patient is agreeable for CT scan being scheduled.

## 2019-05-22 ENCOUNTER — Ambulatory Visit
Admission: RE | Admit: 2019-05-22 | Discharge: 2019-05-22 | Disposition: A | Discharge status: Home / Self Care | Payer: Medicaid Other | Source: Ambulatory Visit | Attending: Nurse Practitioner | Admitting: Nurse Practitioner

## 2019-05-22 ENCOUNTER — Other Ambulatory Visit: Payer: Self-pay

## 2019-05-22 DIAGNOSIS — Z122 Encounter for screening for malignant neoplasm of respiratory organs: Secondary | ICD-10-CM | POA: Diagnosis not present

## 2019-05-22 DIAGNOSIS — Z87891 Personal history of nicotine dependence: Secondary | ICD-10-CM | POA: Insufficient documentation

## 2019-05-26 ENCOUNTER — Encounter: Payer: Self-pay | Admitting: *Deleted

## 2019-12-31 ENCOUNTER — Inpatient Hospital Stay (HOSPITAL_COMMUNITY): Payer: Medicaid Other

## 2019-12-31 ENCOUNTER — Emergency Department (HOSPITAL_COMMUNITY): Payer: Medicaid Other

## 2019-12-31 ENCOUNTER — Encounter (HOSPITAL_COMMUNITY)
Admission: EM | Disposition: A | Discharge status: Skilled Nursing Facility | Payer: Self-pay | Source: Home / Self Care | Attending: Neurology

## 2019-12-31 ENCOUNTER — Emergency Department (HOSPITAL_COMMUNITY): Payer: Medicaid Other | Admitting: Certified Registered"

## 2019-12-31 ENCOUNTER — Encounter (HOSPITAL_COMMUNITY): Payer: Self-pay | Admitting: Student in an Organized Health Care Education/Training Program

## 2019-12-31 ENCOUNTER — Inpatient Hospital Stay (HOSPITAL_COMMUNITY)
Admission: EM | Admit: 2019-12-31 | Discharge: 2020-01-08 | DRG: 023 | Disposition: A | Discharge status: Rehabilitation Facility | Payer: Medicaid Other | Attending: Neurology | Admitting: Neurology

## 2019-12-31 DIAGNOSIS — G8194 Hemiplegia, unspecified affecting left nondominant side: Secondary | ICD-10-CM

## 2019-12-31 DIAGNOSIS — I2510 Atherosclerotic heart disease of native coronary artery without angina pectoris: Secondary | ICD-10-CM | POA: Diagnosis present

## 2019-12-31 DIAGNOSIS — F17200 Nicotine dependence, unspecified, uncomplicated: Secondary | ICD-10-CM | POA: Diagnosis present

## 2019-12-31 DIAGNOSIS — E781 Pure hyperglyceridemia: Secondary | ICD-10-CM | POA: Diagnosis present

## 2019-12-31 DIAGNOSIS — J189 Pneumonia, unspecified organism: Secondary | ICD-10-CM

## 2019-12-31 DIAGNOSIS — I6521 Occlusion and stenosis of right carotid artery: Secondary | ICD-10-CM | POA: Diagnosis present

## 2019-12-31 DIAGNOSIS — I63511 Cerebral infarction due to unspecified occlusion or stenosis of right middle cerebral artery: Secondary | ICD-10-CM | POA: Diagnosis not present

## 2019-12-31 DIAGNOSIS — R739 Hyperglycemia, unspecified: Secondary | ICD-10-CM | POA: Diagnosis present

## 2019-12-31 DIAGNOSIS — F1910 Other psychoactive substance abuse, uncomplicated: Secondary | ICD-10-CM | POA: Diagnosis not present

## 2019-12-31 DIAGNOSIS — Z79899 Other long term (current) drug therapy: Secondary | ICD-10-CM

## 2019-12-31 DIAGNOSIS — I609 Nontraumatic subarachnoid hemorrhage, unspecified: Secondary | ICD-10-CM | POA: Diagnosis not present

## 2019-12-31 DIAGNOSIS — M79672 Pain in left foot: Secondary | ICD-10-CM | POA: Diagnosis not present

## 2019-12-31 DIAGNOSIS — R0902 Hypoxemia: Secondary | ICD-10-CM

## 2019-12-31 DIAGNOSIS — I69391 Dysphagia following cerebral infarction: Secondary | ICD-10-CM

## 2019-12-31 DIAGNOSIS — Z7951 Long term (current) use of inhaled steroids: Secondary | ICD-10-CM

## 2019-12-31 DIAGNOSIS — I10 Essential (primary) hypertension: Secondary | ICD-10-CM | POA: Diagnosis present

## 2019-12-31 DIAGNOSIS — I201 Angina pectoris with documented spasm: Secondary | ICD-10-CM | POA: Diagnosis not present

## 2019-12-31 DIAGNOSIS — I63311 Cerebral infarction due to thrombosis of right middle cerebral artery: Secondary | ICD-10-CM

## 2019-12-31 DIAGNOSIS — G8191 Hemiplegia, unspecified affecting right dominant side: Secondary | ICD-10-CM | POA: Diagnosis not present

## 2019-12-31 DIAGNOSIS — I351 Nonrheumatic aortic (valve) insufficiency: Secondary | ICD-10-CM | POA: Diagnosis not present

## 2019-12-31 DIAGNOSIS — I639 Cerebral infarction, unspecified: Secondary | ICD-10-CM | POA: Diagnosis present

## 2019-12-31 DIAGNOSIS — E785 Hyperlipidemia, unspecified: Secondary | ICD-10-CM | POA: Diagnosis not present

## 2019-12-31 DIAGNOSIS — G479 Sleep disorder, unspecified: Secondary | ICD-10-CM | POA: Diagnosis not present

## 2019-12-31 DIAGNOSIS — E669 Obesity, unspecified: Secondary | ICD-10-CM | POA: Diagnosis present

## 2019-12-31 DIAGNOSIS — N390 Urinary tract infection, site not specified: Secondary | ICD-10-CM

## 2019-12-31 DIAGNOSIS — Z803 Family history of malignant neoplasm of breast: Secondary | ICD-10-CM | POA: Diagnosis not present

## 2019-12-31 DIAGNOSIS — Z20822 Contact with and (suspected) exposure to covid-19: Secondary | ICD-10-CM | POA: Diagnosis not present

## 2019-12-31 DIAGNOSIS — Z8249 Family history of ischemic heart disease and other diseases of the circulatory system: Secondary | ICD-10-CM

## 2019-12-31 DIAGNOSIS — J9601 Acute respiratory failure with hypoxia: Secondary | ICD-10-CM | POA: Diagnosis not present

## 2019-12-31 DIAGNOSIS — I63231 Cerebral infarction due to unspecified occlusion or stenosis of right carotid arteries: Secondary | ICD-10-CM | POA: Diagnosis not present

## 2019-12-31 DIAGNOSIS — I63411 Cerebral infarction due to embolism of right middle cerebral artery: Secondary | ICD-10-CM

## 2019-12-31 DIAGNOSIS — I69354 Hemiplegia and hemiparesis following cerebral infarction affecting left non-dominant side: Secondary | ICD-10-CM | POA: Diagnosis not present

## 2019-12-31 DIAGNOSIS — Z7982 Long term (current) use of aspirin: Secondary | ICD-10-CM | POA: Diagnosis not present

## 2019-12-31 DIAGNOSIS — T17908A Unspecified foreign body in respiratory tract, part unspecified causing other injury, initial encounter: Secondary | ICD-10-CM | POA: Diagnosis not present

## 2019-12-31 DIAGNOSIS — J449 Chronic obstructive pulmonary disease, unspecified: Secondary | ICD-10-CM | POA: Diagnosis present

## 2019-12-31 DIAGNOSIS — J441 Chronic obstructive pulmonary disease with (acute) exacerbation: Secondary | ICD-10-CM

## 2019-12-31 DIAGNOSIS — R29718 NIHSS score 18: Secondary | ICD-10-CM | POA: Diagnosis not present

## 2019-12-31 DIAGNOSIS — E7800 Pure hypercholesterolemia, unspecified: Secondary | ICD-10-CM | POA: Diagnosis not present

## 2019-12-31 DIAGNOSIS — R799 Abnormal finding of blood chemistry, unspecified: Secondary | ICD-10-CM | POA: Diagnosis not present

## 2019-12-31 HISTORY — DX: Benign neoplasm, unspecified site: D36.9

## 2019-12-31 HISTORY — PX: IR PERCUTANEOUS ART THROMBECTOMY/INFUSION INTRACRANIAL INC DIAG ANGIO: IMG6087

## 2019-12-31 HISTORY — PX: RADIOLOGY WITH ANESTHESIA: SHX6223

## 2019-12-31 HISTORY — PX: IR CT HEAD LTD: IMG2386

## 2019-12-31 LAB — CBC
HCT: 42.5 % (ref 36.0–46.0)
Hemoglobin: 13.5 g/dL (ref 12.0–15.0)
MCH: 28.2 pg (ref 26.0–34.0)
MCHC: 31.8 g/dL (ref 30.0–36.0)
MCV: 88.7 fL (ref 80.0–100.0)
Platelets: 208 10*3/uL (ref 150–400)
RBC: 4.79 MIL/uL (ref 3.87–5.11)
RDW: 12.7 % (ref 11.5–15.5)
WBC: 8.1 10*3/uL (ref 4.0–10.5)
nRBC: 0 % (ref 0.0–0.2)

## 2019-12-31 LAB — COMPREHENSIVE METABOLIC PANEL
ALT: 10 U/L (ref 0–44)
AST: 17 U/L (ref 15–41)
Albumin: 4.1 g/dL (ref 3.5–5.0)
Alkaline Phosphatase: 89 U/L (ref 38–126)
Anion gap: 9 (ref 5–15)
BUN: 13 mg/dL (ref 6–20)
CO2: 21 mmol/L — ABNORMAL LOW (ref 22–32)
Calcium: 9.8 mg/dL (ref 8.9–10.3)
Chloride: 110 mmol/L (ref 98–111)
Creatinine, Ser: 0.95 mg/dL (ref 0.44–1.00)
GFR calc Af Amer: >60 mL/min (ref >60)
GFR calc non Af Amer: >60 mL/min (ref >60)
Glucose, Bld: 108 mg/dL — ABNORMAL HIGH (ref 70–99)
Potassium: 3.7 mmol/L (ref 3.5–5.1)
Sodium: 140 mmol/L (ref 135–145)
Total Bilirubin: 0.5 mg/dL (ref 0.3–1.2)
Total Protein: 6.7 g/dL (ref 6.5–8.1)

## 2019-12-31 LAB — DIFFERENTIAL
Abs Immature Granulocytes: 0.02 10*3/uL (ref 0.00–0.07)
Basophils Absolute: 0.1 10*3/uL (ref 0.0–0.1)
Basophils Relative: 1 %
Eosinophils Absolute: 0.2 10*3/uL (ref 0.0–0.5)
Eosinophils Relative: 2 %
Immature Granulocytes: 0 %
Lymphocytes Relative: 23 %
Lymphs Abs: 1.9 10*3/uL (ref 0.7–4.0)
Monocytes Absolute: 0.4 10*3/uL (ref 0.1–1.0)
Monocytes Relative: 5 %
Neutro Abs: 5.5 10*3/uL (ref 1.7–7.7)
Neutrophils Relative %: 69 %

## 2019-12-31 LAB — APTT: aPTT: 29 seconds (ref 24–36)

## 2019-12-31 LAB — I-STAT CHEM 8, ED
BUN: 15 mg/dL (ref 6–20)
Calcium, Ion: 1.11 mmol/L — ABNORMAL LOW (ref 1.15–1.40)
Chloride: 110 mmol/L (ref 98–111)
Creatinine, Ser: 0.9 mg/dL (ref 0.44–1.00)
Glucose, Bld: 105 mg/dL — ABNORMAL HIGH (ref 70–99)
HCT: 41 % (ref 36.0–46.0)
Hemoglobin: 13.9 g/dL (ref 12.0–15.0)
Potassium: 3.6 mmol/L (ref 3.5–5.1)
Sodium: 142 mmol/L (ref 135–145)
TCO2: 23 mmol/L (ref 22–32)

## 2019-12-31 LAB — HIV ANTIBODY (ROUTINE TESTING W REFLEX): HIV Screen 4th Generation wRfx: NONREACTIVE

## 2019-12-31 LAB — SARS CORONAVIRUS 2 BY RT PCR (HOSPITAL ORDER, PERFORMED IN ~~LOC~~ HOSPITAL LAB): SARS Coronavirus 2: NEGATIVE

## 2019-12-31 LAB — CBG MONITORING, ED: Glucose-Capillary: 89 mg/dL (ref 70–99)

## 2019-12-31 LAB — MRSA PCR SCREENING: MRSA by PCR: NEGATIVE

## 2019-12-31 LAB — PROTIME-INR
INR: 1.1 (ref 0.8–1.2)
Prothrombin Time: 13.7 seconds (ref 11.4–15.2)

## 2019-12-31 SURGERY — RADIOLOGY WITH ANESTHESIA
Anesthesia: General | Laterality: Left

## 2019-12-31 MED ORDER — SUGAMMADEX SODIUM 200 MG/2ML IV SOLN
INTRAVENOUS | Status: DC | PRN
Start: 2019-12-31 — End: 2019-12-31
  Administered 2019-12-31: 200 mg via INTRAVENOUS

## 2019-12-31 MED ORDER — SENNOSIDES-DOCUSATE SODIUM 8.6-50 MG PO TABS: 1.0000 | ORAL_TABLET | Freq: Every evening | ORAL | Status: DC | PRN

## 2019-12-31 MED ORDER — SUCCINYLCHOLINE CHLORIDE 200 MG/10ML IV SOSY
PREFILLED_SYRINGE | INTRAVENOUS | Status: DC | PRN
  Administered 2019-12-31: 120 mg via INTRAVENOUS

## 2019-12-31 MED ORDER — PHENYLEPHRINE HCL-NACL 10-0.9 MG/250ML-% IV SOLN: 25.0000 ug/min | INTRAVENOUS | Status: DC

## 2019-12-31 MED ORDER — IOHEXOL 240 MG/ML SOLN
INTRAMUSCULAR | Status: AC
  Administered 2019-12-31: 75 mL via INTRA_ARTERIAL
  Filled 2019-12-31: qty 100

## 2019-12-31 MED ORDER — EPTIFIBATIDE 20 MG/10ML IV SOLN
INTRAVENOUS | Status: AC
  Filled 2019-12-31: qty 10

## 2019-12-31 MED ORDER — LIDOCAINE 2% (20 MG/ML) 5 ML SYRINGE
INTRAMUSCULAR | Status: DC | PRN
  Administered 2019-12-31: 70 mg via INTRAVENOUS

## 2019-12-31 MED ORDER — ROCURONIUM BROMIDE 10 MG/ML (PF) SYRINGE
PREFILLED_SYRINGE | INTRAVENOUS | Status: DC | PRN
  Administered 2019-12-31: 10 mg via INTRAVENOUS
  Administered 2019-12-31: 40 mg via INTRAVENOUS
  Administered 2019-12-31: 20 mg via INTRAVENOUS

## 2019-12-31 MED ORDER — VERAPAMIL HCL 2.5 MG/ML IV SOLN
INTRAVENOUS | Status: AC
  Filled 2019-12-31: qty 2

## 2019-12-31 MED ORDER — ACETAMINOPHEN 650 MG RE SUPP: 650.0000 mg | RECTAL | Status: DC | PRN

## 2019-12-31 MED ORDER — SODIUM CHLORIDE 0.9 % IV SOLN
250.0000 mL | INTRAVENOUS | Status: DC
  Administered 2020-01-04: 250 mL via INTRAVENOUS

## 2019-12-31 MED ORDER — PHENYLEPHRINE 40 MCG/ML (10ML) SYRINGE FOR IV PUSH (FOR BLOOD PRESSURE SUPPORT)
PREFILLED_SYRINGE | INTRAVENOUS | Status: DC | PRN
  Administered 2019-12-31: 80 ug via INTRAVENOUS

## 2019-12-31 MED ORDER — CHLORHEXIDINE GLUCONATE CLOTH 2 % EX PADS
6.0000 | MEDICATED_PAD | Freq: Every day | CUTANEOUS | Status: DC
  Administered 2020-01-03 – 2020-01-08 (×6): 6 via TOPICAL

## 2019-12-31 MED ORDER — IOHEXOL 300 MG/ML  SOLN
50.0000 mL | Freq: Once | INTRAMUSCULAR | Status: AC | PRN
  Administered 2019-12-31: 30 mL via INTRA_ARTERIAL

## 2019-12-31 MED ORDER — PHENYLEPHRINE HCL-NACL 10-0.9 MG/250ML-% IV SOLN
INTRAVENOUS | Status: DC | PRN
  Administered 2019-12-31: 60 ug/min via INTRAVENOUS

## 2019-12-31 MED ORDER — IOHEXOL 240 MG/ML SOLN
INTRAMUSCULAR | Status: AC
  Filled 2019-12-31: qty 200

## 2019-12-31 MED ORDER — CLOPIDOGREL BISULFATE 300 MG PO TABS
ORAL_TABLET | ORAL | Status: AC
  Filled 2019-12-31: qty 1

## 2019-12-31 MED ORDER — VERAPAMIL HCL 2.5 MG/ML IV SOLN
INTRAVENOUS | Status: DC | PRN
  Administered 2019-12-31: 5 mg via INTRAVENOUS

## 2019-12-31 MED ORDER — EPHEDRINE SULFATE-NACL 50-0.9 MG/10ML-% IV SOSY
PREFILLED_SYRINGE | INTRAVENOUS | Status: DC | PRN
  Administered 2019-12-31: 5 mg via INTRAVENOUS

## 2019-12-31 MED ORDER — IOHEXOL 350 MG/ML SOLN
100.0000 mL | Freq: Once | INTRAVENOUS | Status: AC | PRN
  Administered 2019-12-31: 100 mL via INTRAVENOUS

## 2019-12-31 MED ORDER — ACETAMINOPHEN 160 MG/5ML PO SOLN
650.0000 mg | ORAL | Status: DC | PRN
  Administered 2020-01-01 – 2020-01-07 (×9): 650 mg
  Filled 2019-12-31 (×8): qty 20.3

## 2019-12-31 MED ORDER — TICAGRELOR 90 MG PO TABS
ORAL_TABLET | ORAL | Status: AC
  Filled 2019-12-31: qty 2

## 2019-12-31 MED ORDER — TIROFIBAN HCL IN NACL 5-0.9 MG/100ML-% IV SOLN
INTRAVENOUS | Status: AC
  Filled 2019-12-31: qty 100

## 2019-12-31 MED ORDER — ASPIRIN 81 MG PO CHEW
CHEWABLE_TABLET | ORAL | Status: AC
  Filled 2019-12-31: qty 1

## 2019-12-31 MED ORDER — CLEVIDIPINE BUTYRATE 0.5 MG/ML IV EMUL
0.0000 mg/h | INTRAVENOUS | Status: DC
  Administered 2019-12-31: 1 mg/h via INTRAVENOUS
  Administered 2020-01-01: 16 mg/h via INTRAVENOUS
  Administered 2020-01-01 (×2): 18 mg/h via INTRAVENOUS
  Administered 2020-01-01: 16 mg/h via INTRAVENOUS
  Administered 2020-01-01 (×3): 21 mg/h via INTRAVENOUS
  Administered 2020-01-01: 20 mg/h via INTRAVENOUS
  Administered 2020-01-01: 21 mg/h via INTRAVENOUS
  Filled 2019-12-31 (×6): qty 50
  Filled 2019-12-31: qty 100
  Filled 2019-12-31 (×5): qty 50

## 2019-12-31 MED ORDER — STROKE: EARLY STAGES OF RECOVERY BOOK
Freq: Once | Status: AC
  Filled 2019-12-31: qty 1

## 2019-12-31 MED ORDER — ONDANSETRON HCL 4 MG/2ML IJ SOLN
INTRAMUSCULAR | Status: DC | PRN
  Administered 2019-12-31: 4 mg via INTRAVENOUS

## 2019-12-31 MED ORDER — ORAL CARE MOUTH RINSE
15.0000 mL | Freq: Two times a day (BID) | OROMUCOSAL | Status: DC
  Administered 2019-12-31 – 2020-01-08 (×16): 15 mL via OROMUCOSAL

## 2019-12-31 MED ORDER — ACETAMINOPHEN 325 MG PO TABS
650.0000 mg | ORAL_TABLET | ORAL | Status: DC | PRN
  Administered 2020-01-04: 650 mg via ORAL
  Filled 2019-12-31 (×3): qty 2

## 2019-12-31 MED ORDER — SODIUM CHLORIDE 0.9 % IV SOLN
INTRAVENOUS | Status: DC | PRN
Start: 2019-12-31 — End: 2019-12-31

## 2019-12-31 MED ORDER — FENTANYL CITRATE (PF) 100 MCG/2ML IJ SOLN
INTRAMUSCULAR | Status: AC
  Filled 2019-12-31: qty 2

## 2019-12-31 MED ORDER — SODIUM CHLORIDE 0.9 % IV SOLN: INTRAVENOUS | Status: DC

## 2019-12-31 MED ORDER — SODIUM CHLORIDE 0.9% FLUSH: 3.0000 mL | Freq: Once | INTRAVENOUS | Status: DC

## 2019-12-31 MED ORDER — PROPOFOL 10 MG/ML IV BOLUS
INTRAVENOUS | Status: DC | PRN
  Administered 2019-12-31: 30 mg via INTRAVENOUS
  Administered 2019-12-31: 120 mg via INTRAVENOUS

## 2019-12-31 MED ORDER — FENTANYL CITRATE (PF) 100 MCG/2ML IJ SOLN
INTRAMUSCULAR | Status: DC | PRN
  Administered 2019-12-31 (×2): 50 ug via INTRAVENOUS

## 2019-12-31 NOTE — Progress Notes (Signed)
MRI/MRA completed. Images reviewed. I agree with the official Radiology report conclusions:  MRI HEAD IMPRESSION: 1. Technically limited exam due to motion artifact. 2. Evolving moderate to large sized acute ischemic right MCA territory infarct as above. No associated hemorrhage or mass effect. 3. Underlying chronic bilateral basal ganglia lacunar infarcts, with additional small chronic left parietal infarct.  MRA HEAD IMPRESSION: 1. Technically limited exam due to extensive motion artifact. 2. Interval revascularization of previously occluded right ICA and MCA status post catheter directed thrombectomy. 3. Underlying short-segment 3 mm severe mid right M1 stenosis. 4. Otherwise grossly stable intracranial MRA as compared to prior CTA.  Electronically signed: Dr. Kerney Elbe

## 2019-12-31 NOTE — Plan of Care (Signed)
Daughter: [name] Bolanos. Phone number updated in the [phone].  -- Amie Portland, MD [facility] Pager: [phone] If 7pm to 7am, please call on call as listed on AMION.

## 2019-12-31 NOTE — Transfer of Care (Signed)
Immediate Anesthesia Transfer of Care Note  Patient: Anne [name]  Procedure(s) Performed: RADIOLOGY WITH ANESTHESIA (Left )  Patient Location: ICU  Anesthesia Type:General  Level of Consciousness: drowsy and patient cooperative  Airway & Oxygen Therapy: Patient Spontanous Breathing and Patient connected to face mask oxygen  Post-op Assessment: Report given to RN and Post -op Vital signs reviewed and stable  Post vital signs: Reviewed and stable  Last Vitals:  Vitals Value Taken Time  BP 97/59 12/31/19 1732  Temp    Pulse 78 12/31/19 1732  Resp 20 12/31/19 1732  SpO2 100 % 12/31/19 1732  Vitals shown include unvalidated device data.  Last Pain:  Vitals:   12/31/19 1545  TempSrc: Oral         Complications: No complications documented.

## 2019-12-31 NOTE — Anesthesia Postprocedure Evaluation (Signed)
Anesthesia Post Note  Patient: Anne [name]  Procedure(s) Performed: RADIOLOGY WITH ANESTHESIA (Left )     Patient location during evaluation: PACU Anesthesia Type: General Level of consciousness: awake and alert Pain management: pain level controlled Vital Signs Assessment: post-procedure vital signs reviewed and stable Respiratory status: spontaneous breathing, nonlabored ventilation, respiratory function stable and patient connected to nasal cannula oxygen Cardiovascular status: blood pressure returned to baseline and stable Postop Assessment: no apparent nausea or vomiting Anesthetic complications: no   No complications documented.  Last Vitals:  Vitals:   12/31/19 1545 12/31/19 1726  BP: (!) 142/51 (!) 148/136  Pulse: (!) 56 79  Resp: 17   Temp: 36.7 C   SpO2: 97% 100%    Last Pain:  Vitals:   12/31/19 1545  TempSrc: Oral                 Jacquetta Polhamus DAVID

## 2019-12-31 NOTE — Anesthesia Preprocedure Evaluation (Signed)
Anesthesia Evaluation  Patient identified by MRN, date of birth, ID band Patient unresponsive    Reviewed: Allergy & Precautions, H&P , NPO status , Patient's Chart, lab work & pertinent test resultsPreop documentation limited or incomplete due to emergent nature of procedure.  Airway   TM Distance: >3 FB    Comment: Unable to cooperate with exam Dental   Pulmonary asthma , Current Smoker,    breath sounds clear to auscultation       Cardiovascular hypertension, + CAD   Rhythm:regular Rate:Normal     Neuro/Psych  Neuromuscular disease CVA    GI/Hepatic (+)     substance abuse  ,   Endo/Other    Renal/GU      Musculoskeletal   Abdominal   Peds  Hematology   Anesthesia Other Findings   Reproductive/Obstetrics                             Anesthesia Physical Anesthesia Plan  ASA: III and emergent  Anesthesia Plan: General   Post-op Pain Management:    Induction: Intravenous  PONV Risk Score and Plan: 2 and Ondansetron and Treatment may vary due to age or medical condition  Airway Management Planned: Oral ETT  Additional Equipment:   Intra-op Plan:   Post-operative Plan: Possible Post-op intubation/ventilation  Informed Consent: I have reviewed the patients History and Physical, chart, labs and discussed the procedure including the risks, benefits and alternatives for the proposed anesthesia with the patient or authorized representative who has indicated his/her understanding and acceptance.       Plan Discussed with: CRNA, Anesthesiologist and Surgeon  Anesthesia Plan Comments:         Anesthesia Quick Evaluation

## 2019-12-31 NOTE — ED Provider Notes (Addendum)
[facility] EMERGENCY DEPARTMENT Provider Note   CSN: [id number] Arrival date & time: 12/31/19  1459  LEVEL 5 CAVEAT - ALTERED MENTAL STATUS  History No chief complaint on file.   Anne [name] is a 59 y.o. female.  HPI 59 year old female presents with acute stroke.  History is limited and obtained mostly from EMS at the bedside.  Daughter last talk to patient around 42.  Patient seemed to take a shower around noon.  She was then found by daughter beside the bed without obvious trauma and had left-sided hemiparesis and right-sided gaze. Seemed to be partially dressed.  Further history is very limited.   Past Medical History:  Diagnosis Date  . Asthma   . CAD (coronary artery disease)    a. cardiac cath 02/04/2013: tubular 20% stenosis pRCA, tubular 30% stenosis mRCA, medically managed  . Coronary vasospasm (HCC)    a. cardiac cath 11/25/2014: mLCx 30%, pRCA 30%, moderate spasm w/ noted improvement w/ reimaging, recommended CCB, long acting nitrate, smoking cessation, & statin   . Hypertension   . Polysubstance abuse ([place])    a. remote history of crack/cocaine abuse approximately 8-10 years ago, ongoing tobacco abuse since age 3, rare etoh abuse    Patient Active Problem List   Diagnosis Date Noted  . CAD (coronary artery disease)   . Polysubstance abuse ([place])   . Hypertension   . Asthma   . Coronary vasospasm ([place])   . NSTEMI (non-ST elevated myocardial infarction) ([place]) 11/25/2014  . Chest pain 11/24/2014    Past Surgical History:  Procedure Laterality Date  . CARDIAC CATHETERIZATION N/A 11/25/2014   Procedure: Left Heart Cath and Coronary Angiography;  Surgeon: Minna Merritts, MD;  Location: [facility];  Service: Cardiovascular;  Laterality: N/A;     OB History    Gravida  2   Para  2   Term  2   Preterm  0   AB  0   Living  0     SAB  0   TAB  0   Ectopic  0   Multiple  0   Live Births              Family  History  Problem Relation Age of Onset  . Breast cancer Mother 47  . CAD Father 55    Social History   Tobacco Use  . Smoking status: Current Every Day Smoker    Packs/day: 1.00    Years: 45.00    Pack years: 45.00  . Smokeless tobacco: Never Used  Substance Use Topics  . Alcohol use: Yes  . Drug use: No    Home Medications Prior to Admission medications   Medication Sig Start Date End Date Taking? Authorizing Provider  albuterol (PROVENTIL HFA;VENTOLIN HFA) 108 (90 BASE) MCG/ACT inhaler Inhale 2 puffs into the lungs every 6 (six) hours as needed for wheezing or shortness of breath. 11/25/14   Gladstone Lighter, MD  albuterol (PROVENTIL) (2.5 MG/3ML) 0.083% nebulizer solution Take 3 mLs (2.5 mg total) by nebulization every 6 (six) hours as needed for wheezing or shortness of breath. 11/25/14   Gladstone Lighter, MD  amLODipine (NORVASC) 5 MG tablet Take 1 tablet (5 mg total) by mouth daily. 11/03/16   Carrie Mew, MD  aspirin EC 81 MG EC tablet Take 1 tablet (81 mg total) by mouth daily. Patient not taking: Reported on 11/03/2016 11/25/14   Gladstone Lighter, MD  cetirizine (ZYRTEC) 10 MG tablet Take 10  mg by mouth daily as needed for allergies.     [provider]  diltiazem (CARDIZEM CD) 120 MG 24 hr capsule Take 1 capsule (120 mg total) by mouth daily. Patient not taking: Reported on 11/03/2016 11/25/14   Gladstone Lighter, MD  esomeprazole (NEXIUM) 20 MG capsule Take 20 mg by mouth daily at 12 noon.    [provider]  famotidine (PEPCID) 20 MG tablet Take 1 tablet (20 mg total) by mouth 2 (two) times daily. 11/03/16   Carrie Mew, MD  Fluticasone-Salmeterol (ADVAIR) 250-50 MCG/DOSE AEPB Inhale 1 puff into the lungs 2 (two) times daily. Patient not taking: Reported on 11/03/2016 11/25/14   Gladstone Lighter, MD  isosorbide mononitrate (IMDUR) 30 MG 24 hr tablet Take 1 tablet (30 mg total) by mouth daily. 11/03/16   Carrie Mew, MD  lisinopril  (PRINIVIL,ZESTRIL) 10 MG tablet Take 10 mg by mouth daily.    [provider]  metoCLOPramide (REGLAN) 10 MG tablet Take 1 tablet (10 mg total) by mouth every 6 (six) hours as needed. 11/03/16   Carrie Mew, MD  nitroGLYCERIN (NITROSTAT) 0.4 MG SL tablet Place 1 tablet (0.4 mg total) under the tongue every 5 (five) minutes as needed for chest pain. Patient not taking: Reported on 11/03/2016 11/25/14   Gladstone Lighter, MD  ondansetron (ZOFRAN ODT) 4 MG disintegrating tablet Take 1 tablet (4 mg total) by mouth every 8 (eight) hours as needed for nausea or vomiting. 11/03/16   Carrie Mew, MD    Allergies    Patient has no known allergies.  Review of Systems   Review of Systems  Unable to perform ROS: Mental status change    Physical Exam Updated Vital Signs There were no vitals taken for this visit.  Physical Exam Vitals and nursing note reviewed.  Constitutional:      Appearance: She is well-developed.  HENT:     Head: Normocephalic and atraumatic.     Right Ear: External ear normal.     Left Ear: External ear normal.     Nose: Nose normal.  Eyes:     General:        Right eye: No discharge.        Left eye: No discharge.  Cardiovascular:     Rate and Rhythm: Normal rate and regular rhythm.     Heart sounds: Normal heart sounds.  Pulmonary:     Effort: Pulmonary effort is normal.     Breath sounds: Normal breath sounds.  Abdominal:     General: There is no distension.     Palpations: Abdomen is soft.     Tenderness: There is no abdominal tenderness.  Skin:    General: Skin is warm and dry.  Neurological:     Mental Status: She is alert.     Comments: Awake, alert, able to answer some questions. Right sided gaze. Normal strength RUE. Mild weakness RLE. Flaccid left extremities  Psychiatric:        Mood and Affect: Mood is not anxious.     ED Results / Procedures / Treatments   Labs (all labs ordered are listed, but only abnormal results are  displayed) Labs Reviewed  I-STAT CHEM 8, ED - Abnormal; Notable for the following components:      Result Value   Glucose, Bld 105 (*)    Calcium, Ion 1.11 (*)    All other components within normal limits  PROTIME-INR  APTT  CBC  DIFFERENTIAL  COMPREHENSIVE METABOLIC PANEL  I-STAT  CHEM 8, ED  CBG MONITORING, ED    EKG None  Radiology No results found.  Procedures .Critical Care Performed by: Sherwood Gambler, MD Authorized by: Sherwood Gambler, MD   Critical care provider statement:    Critical care time (minutes):  30   Critical care time was exclusive of:  Separately billable procedures and treating other patients   Critical care was necessary to treat or prevent imminent or life-threatening deterioration of the following conditions:  CNS failure or compromise   Critical care was time spent personally by me on the following activities:  Discussions with consultants, evaluation of patient's response to treatment, examination of patient, ordering and performing treatments and interventions, ordering and review of laboratory studies, ordering and review of radiographic studies, pulse oximetry, re-evaluation of patient's condition, obtaining history from patient or surrogate and review of old charts   (including critical care time)  Medications Ordered in ED Medications  sodium chloride flush (NS) 0.9 % injection 3 mL (has no administration in time range)  tirofiban (AGGRASTAT) 5-0.9 MG/100ML-% injection (has no administration in time range)  aspirin 81 MG chewable tablet (has no administration in time range)  verapamil (ISOPTIN) 2.5 MG/ML injection (has no administration in time range)  ticagrelor (BRILINTA) 90 MG tablet (has no administration in time range)  clopidogrel (PLAVIX) 300 MG tablet (has no administration in time range)  iohexol (OMNIPAQUE) 240 MG/ML injection (has no administration in time range)  eptifibatide (INTEGRILIN) 20 MG/10ML injection (has no  administration in time range)  fentaNYL (SUBLIMAZE) 100 MCG/2ML injection (has no administration in time range)  iohexol (OMNIPAQUE) 240 MG/ML injection (has no administration in time range)  verapamil (ISOPTIN) 2.5 MG/ML injection (has no administration in time range)  iohexol (OMNIPAQUE) 350 MG/ML injection 100 mL (100 mLs Intravenous Contrast Given 12/31/19 1535)    ED Course  I have reviewed the triage vital signs and the nursing notes.  Pertinent labs & imaging results that were available during my care of the patient were reviewed by me and considered in my medical decision making (see chart for details).    MDM Rules/Calculators/A&P                          Patient is maintaining her airway.  However she is appearing to have a large stroke.  Further history was obtained via neurology and it seems like her best last known normal was closer to 9:30 AM.  Thus no TPA.  However she appears to have both carotid and M1 occlusions and CT perfusion imaging indicates she is an endovascular candidate.  She was taken emergently to endovascular therapy by neurology and IR. Final Clinical Impression(s) / ED Diagnoses Final diagnoses:  Acute ischemic stroke [facility])    Rx / DC Orders ED Discharge Orders    None       Sherwood Gambler, MD 12/31/19 1541    Sherwood Gambler, MD 12/31/19 [phone]

## 2019-12-31 NOTE — ED Triage Notes (Signed)
Pt arrives via EMS from home with complaints of stroke like symptoms. Pt LSN around 1230 12/31/19. Found sitting down on floor after a shower. EMS reports left sided weakness, left facial droop and slurred speech.

## 2019-12-31 NOTE — Consult Note (Signed)
Encountered pt's family member and accompanied her from 18th floor to ED waiting room, where several other family members were waiting outside. Provided comfort, prayed for pt, let them know they may ask nurse to call a chaplain at any time.   Rev. Eloise Levels Chaplain

## 2019-12-31 NOTE — Anesthesia Procedure Notes (Signed)
Procedure Name: Intubation Date/Time: 12/31/2019 3:38 PM Performed by: Orlie Dakin, CRNA Pre-anesthesia Checklist: Patient identified, Emergency Drugs available, Suction available and Patient being monitored Patient Re-evaluated:Patient Re-evaluated prior to induction Oxygen Delivery Method: Circle system utilized Preoxygenation: Pre-oxygenation with 100% oxygen Induction Type: IV induction, Rapid sequence and Cricoid Pressure applied Laryngoscope Size: Glidescope and 4 Grade View: Grade I Tube type: Oral Tube size: 7.0 mm Number of attempts: 1 Airway Equipment and Method: Stylet and Video-laryngoscopy Placement Confirmation: ETT inserted through vocal cords under direct vision,  positive ETCO2 and CO2 detector Secured at: 22 cm Tube secured with: Tape Dental Injury: Teeth and Oropharynx as per pre-operative assessment  Comments: In Intubation Suite in IR, RSI and Glidescope Go used due to unknown Covid test result.

## 2019-12-31 NOTE — Progress Notes (Signed)
Bedside report given to RN with CRNA. Groin assessed at bedside, level 0 at handoff.

## 2019-12-31 NOTE — Plan of Care (Signed)
Successful mechanical thrombectomy with complete recanalization of the right M1 TICI 3, angioplasty of the right carotid. Possible small to moderate sized subarachnoid hemorrhage in the right sylvian fissure.  Recommendations: In addition to the recommendations provided in the initial consultation, repeat CT head at 2100 hrs.  We will sign out to Dr. Cheral Marker to follow-up overnight.  -- Amie Portland, MD [facility] Pager: [phone] If 7pm to 7am, please call on call as listed on AMION.

## 2019-12-31 NOTE — H&P (Signed)
Neurology H&P    CC: Flaccid left arm and leg  History is obtained from: EMS and daughter  HPI: Anne [name] is a 59 y.o. female with history of polysubstance abuse-crack/cocaine, hypertension, coronary vasospasms and CAD.  Patient was brought to hospital as code stroke.  Per daughter she was last seen normal at 9:30 AM.  Between that time and when EMS arrived at scene is unknown when patient had stroke.  Per daughter the niece was home but patient did not come out of room.  Patient was found in the bathroom with the tub full and on the floor.  Patient was immediately brought to [facility] as code stroke from [place].  On arrival patient had a right gaze deviation and left-sided hemiplegia.  Initial CT head did reveal progressing stroke.  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  Patient was immediately brought back to IR for thrombectomy.  In discussing with the daughter, patient is fully functional at home, she has been having headaches for 2 weeks and has seen a physician however it is unknown what physician.  Daughter believes she has been taking aspirin for her headaches.  Daughter is [place] Zou and number is [phone]  LKW: 9:30 AM on 12/31/2019 tpa given?: no, out of window Premorbid modified Rankin scale (mRS): 0 NIH stroke scale: 18   Past Medical History:  Diagnosis Date  . Asthma   . CAD (coronary artery disease)    a. cardiac cath 02/04/2013: tubular 20% stenosis pRCA, tubular 30% stenosis mRCA, medically managed  . Coronary vasospasm (HCC)    a. cardiac cath 11/25/2014: mLCx 30%, pRCA 30%, moderate spasm w/ noted improvement w/ reimaging, recommended CCB, long acting nitrate, smoking cessation, & statin   . Hypertension   . Polysubstance abuse ([place])    a. remote history of crack/cocaine abuse approximately 8-10 years ago, ongoing tobacco  abuse since age 12, rare etoh abuse    Family History  Problem Relation Age of Onset  . Breast cancer Mother 62  . CAD Father 33   Social History:   reports that she has been smoking. She has a 45.00 pack-year smoking history. She has never used smokeless tobacco. She reports current alcohol use. She reports that she does not use drugs.  Medications  Current Facility-Administered Medications:  .  aspirin 81 MG chewable tablet, , , ,  .  clopidogrel (PLAVIX) 300 MG tablet, , , ,  .  eptifibatide (INTEGRILIN) 20 MG/10ML injection, , , ,  .  iohexol (OMNIPAQUE) 240 MG/ML injection, , , ,  .  sodium chloride flush (NS) 0.9 % injection 3 mL, 3 mL, Intravenous, Once, Tegeler, Gwenyth Allegra, MD .  ticagrelor (BRILINTA) 90 MG tablet, , , ,  .  tirofiban (AGGRASTAT) 5-0.9 MG/100ML-% injection, , , ,  .  verapamil (ISOPTIN) 2.5 MG/ML injection, , , ,   Current Outpatient Medications:  .  albuterol (PROVENTIL HFA;VENTOLIN HFA) 108 (90 BASE) MCG/ACT inhaler, Inhale 2 puffs into the lungs every 6 (six) hours as needed for wheezing or shortness of breath., Disp: 1 Inhaler, Rfl: 1 .  albuterol (PROVENTIL) (2.5 MG/3ML) 0.083% nebulizer solution, Take 3 mLs (2.5 mg total) by nebulization every 6 (six) hours as needed for wheezing or shortness of breath., Disp: 75 mL, Rfl: 0 .  amLODipine (NORVASC) 5 MG tablet, Take 1 tablet (5 mg total) by mouth daily., Disp: 30  tablet, Rfl: 1 .  aspirin EC 81 MG EC tablet, Take 1 tablet (81 mg total) by mouth daily. (Patient not taking: Reported on 11/03/2016), Disp: 30 tablet, Rfl: 1 .  cetirizine (ZYRTEC) 10 MG tablet, Take 10 mg by mouth daily as needed for allergies. , Disp: , Rfl:  .  diltiazem (CARDIZEM CD) 120 MG 24 hr capsule, Take 1 capsule (120 mg total) by mouth daily. (Patient not taking: Reported on 11/03/2016), Disp: 30 capsule, Rfl: 1 .  esomeprazole (NEXIUM) 20 MG capsule, Take 20 mg by mouth daily at 12 noon., Disp: , Rfl:  .  famotidine (PEPCID) 20 MG  tablet, Take 1 tablet (20 mg total) by mouth 2 (two) times daily., Disp: 60 tablet, Rfl: 0 .  Fluticasone-Salmeterol (ADVAIR) 250-50 MCG/DOSE AEPB, Inhale 1 puff into the lungs 2 (two) times daily. (Patient not taking: Reported on 11/03/2016), Disp: 60 each, Rfl: 1 .  isosorbide mononitrate (IMDUR) 30 MG 24 hr tablet, Take 1 tablet (30 mg total) by mouth daily., Disp: 30 tablet, Rfl: 0 .  lisinopril (PRINIVIL,ZESTRIL) 10 MG tablet, Take 10 mg by mouth daily., Disp: , Rfl:  .  metoCLOPramide (REGLAN) 10 MG tablet, Take 1 tablet (10 mg total) by mouth every 6 (six) hours as needed., Disp: 30 tablet, Rfl: 0 .  nitroGLYCERIN (NITROSTAT) 0.4 MG SL tablet, Place 1 tablet (0.4 mg total) under the tongue every 5 (five) minutes as needed for chest pain. (Patient not taking: Reported on 11/03/2016), Disp: 30 tablet, Rfl: 0 .  ondansetron (ZOFRAN ODT) 4 MG disintegrating tablet, Take 1 tablet (4 mg total) by mouth every 8 (eight) hours as needed for nausea or vomiting., Disp: 20 tablet, Rfl: 0  ROS:  Unable to obtain due to altered mental status.   Exam: Current vital signs: BP (!) 142/51 (BP Location: Left Arm)   Pulse (!) 56   Temp 98 F (36.7 C) (Oral)   Resp 17   Ht 5\' 6"  (1.676 m)   Wt 93.5 kg   SpO2 97%   BMI 33.27 kg/m  Vital signs in last 24 hours: Temp:  [98 F (36.7 C)] 98 F (36.7 C) (06/17 1545) Pulse Rate:  [56] 56 (06/17 1545) Resp:  [17] 17 (06/17 1545) BP: (142)/(51) 142/51 (06/17 1545) SpO2:  [97 %] 97 % (06/17 1545) Weight:  [93.5 kg] 93.5 kg (06/17 1546) General: Awake alert no distress HEENT: Cephalic atraumatic Cardiovascular: Regular rate rhythm Extremities warm well perfused Abdomen nondistended nontender Neurological exam Awake alert oriented x3 Speech is moderately dysarthric No evidence of aphasia Cranial nerves: Pupils equal round reactive to light, extraocular movement examination shows forced right gaze but she is able to overcome that and come to midline but  cannot cross midline, there is complete left M1 with some anopsia and left lower facial weakness.  Tongue and palate are midline. Motor exam: Left upper extremity is flaccid.  Left lower extremity is at best 2/5.  Right upper and lower extremity examination shows right upper extremity 5/5 and right lower extremity 3/5. Sensory exam: Diminished on the left, no extinction on double simultaneous stimulation. Coordination: No dysmetria on the right, unable to perform in the left. NIH stroke scale 1a Level of Conscious.: 0 1b LOC Questions: 0 1c LOC Commands: 0 2 Best Gaze: 2 3 Visual: 2 4 Facial Palsy: 2 5a Motor Arm - left: 4 5b Motor Arm - Right: 0 6a Motor Leg - Left: 2 6b Motor Leg - Right: 2 7 Limb Ataxia:  0 8 Sensory: 2 9 Best Language: 0 10 Dysarthria: 2 11 Extinct. and Inatten.: 0 TOTAL:18   Labs I have reviewed labs in epic and the results pertinent to this consultation are:  CBC    Component Value Date/Time   WBC 8.1 12/31/2019 1514   RBC 4.79 12/31/2019 1514   HGB 13.5 12/31/2019 1514   HGB 14.0 01/22/2014 1325   HCT 42.5 12/31/2019 1514   HCT 43.4 01/22/2014 1325   PLT 208 12/31/2019 1514   PLT 245 01/22/2014 1325   MCV 88.7 12/31/2019 1514   MCV 88 01/22/2014 1325   MCH 28.2 12/31/2019 1514   MCHC 31.8 12/31/2019 1514   RDW 12.7 12/31/2019 1514   RDW 13.5 01/22/2014 1325   LYMPHSABS 1.9 12/31/2019 1514   MONOABS 0.4 12/31/2019 1514   EOSABS 0.2 12/31/2019 1514   BASOSABS 0.1 12/31/2019 1514    CMP     Component Value Date/Time   NA 142 12/31/2019 1510   NA 140 01/22/2014 1325   K 3.6 12/31/2019 1510   K 4.3 01/22/2014 1325   CL 110 12/31/2019 1510   CL 109 (H) 01/22/2014 1325   CO2 25 11/03/2016 1524   CO2 25 01/22/2014 1325   GLUCOSE 105 (H) 12/31/2019 1510   GLUCOSE 122 (H) 01/22/2014 1325   BUN 15 12/31/2019 1510   BUN 11 01/22/2014 1325   CREATININE 0.90 12/31/2019 1510   CREATININE 1.29 01/22/2014 1325   CALCIUM 10.3 11/03/2016 1524    CALCIUM 9.0 01/22/2014 1325   GFRNONAA 38 (L) 11/03/2016 1524   GFRNONAA 47 (L) 01/22/2014 1325   GFRAA 45 (L) 11/03/2016 1524   GFRAA 54 (L) 01/22/2014 1325   Imaging I have reviewed the images obtained: CT-scan of the brain- 1. Acute nonhemorrhagic right MCA infarct. 2. ASPECTS is 6. 3. Chronic left basal ganglia infarct.  CT angio head and neck IMPRESSION: 1. 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 2. Distal right M1 occlusion. 3. 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 4. Mild left vertebral artery origin stenosis. 5. Aortic Atherosclerosis (ICD10-I70.0) and Emphysema (ICD10-J43.9). CT Brain Perfusion Findings: ASPECTS: 6 CBF (<30%) Volume: 53 mL Perfusion (Tmax>6.0s) volume: 204 mL Mismatch Volume: 151 mL Infarction Location: Right MCA territory  Etta Quill PA-C [facility] [phone]  M-F  (9:00 am- 5:00 PM)  12/31/2019, 3:31 PM   Assessment:  This is a 59 year old female brought to [facility] as code stroke.  On arrival patient had right gaze deviation and left-sided hemiplegia.  CT head showed evolving stroke, CTA head and neck showed occlusion of the right internal carotid artery and MCA.  Patient's perfusion scan showed core however she was within limits to receive intervention.  Daughter was fully informed about risks and benefits.  Daughter agreed to thrombectomy.  Patient was immediately brought back to intervention for thrombectomy.  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 -Hyperglycemia management per SSI to maintain glucose 140-180mg /dL. -PT/OT/ST therapies and recommendations when  able  CNS -Close neuro monitoring  Dysarthria -NPO until cleared by speech -Advance diet as tolerated  RESP -vent management per ICU -wean when able  CV Essential (primary) hypertension -Aggressive BP control, goal SBP per interventional radiology -Titrate oral  agents -Current no active issues  Hyperlipidemia, unspecified  - Statin for goal LDL < 70  HEME -Monitor -transfuse for hgb < 7  ENDO -SSI -goal HgbA1c < 7  GI/GU No active issues  Fluid/Electrolyte Disorders -Replete -Repeat labs -NS 75cc/h  ID -No active issues  Possible UTI -No active issues  Prophylaxis DVT: SCD GI: Protonix Bowel: Senokot  Diet: NPO until cleared by speech  Code Status: Full Code     Attending Neurohospitalist Addendum Patient seen and examined with APP/Resident. Agree with the history and physical as documented above. I have independently reviewed the chart, obtained history, review of systems and examined the patient.I have personally reviewed pertinent head/neck/spine imaging (CT/MRI). Briefly, 59 year old woman with last known normal at 9:30 AM today, noted by family around the time of calling EMS to be found between the bed and dresser, partly dressed in her clothes with left-sided weakness and inability to get up.  EMS assessed the patient, fast ED score of 7 concerning for large vessel occlusion and brought her to [facility] for further evaluation. She was a hard stick and they were unable to access her veins and had no vascular access. On arrival, she was evaluated in the ED, taken for a stat CT head which showed an evolving right MCA stroke with an aspects of 6.  After some delay in getting IV access, CT angiogram head and neck and CT perfusion study was done which revealed right ICA occlusion and right M1 occlusion. CT perfusion showed 53 cc / 204 cc core/penumbra. Discussed the findings with the interventionalists Dr. Ladean Raya who was amenable to  taking patient for intervention. Discussed over the phone with daughter [name] Soucy-the risks and benefits of surgery explaining that it is a moderate-sized stroke that has probably already established and her being outside the window, the best chance for providing her the best ability for clinical improvement is with an attempted revascularization via mechanical thrombectomy and possibly stent placement in the cervical carotid. The daughter, consented for the procedure over the phone-witnessed by RN. Patient's examination revealed a dense left hemiparesis, right gaze deviation with inability to cross the midline, left hemianopsia, and left-sided sensory loss without extinction with an NIH stroke scale of 19. She was taken in for emergent thrombectomy in the neuro IR suite. She will be admitted to the neurological ICU after the procedure. Plan as above which I have formulated with the APP. Stroke team will continue to follow.    Please feel free to call with any questions. --- Amie Portland, MD [facility] Pager: [phone]  If 7pm to 7am, please call on call as listed on AMION.  CRITICAL CARE ATTESTATION Performed by: Amie Portland, MD Total critical care time: 65 minutes Critical care time was exclusive of separately billable procedures and treating other patients and/or supervising APPs/Residents/Students Critical care was necessary to treat or prevent imminent or life-threatening deterioration due to acute ischemic stroke This patient is critically ill and at significant risk for neurological worsening and/or death and care requires constant monitoring. Critical care was time spent personally by me on the following activities: development of treatment plan with patient and/or surrogate as well as nursing, discussions with consultants, evaluation of patient's response to treatment, examination of patient, obtaining history from patient or surrogate, ordering and  performing treatments and interventions, ordering and review of laboratory studies, ordering and review of radiographic studies, pulse oximetry, re-evaluation of patient's condition, participation in multidisciplinary rounds and medical decision making of  high complexity in the care of this patient.

## 2019-12-31 NOTE — Procedures (Signed)
INTERVENTIONAL NEURORADIOLOGY BRIEF POSTPROCEDURE NOTE  MECHANICAL THROMBECTOMY   Attending: Dr. Pedro Earls  Assistant: None  Diagnosis: Right ICA and right MCA occlusion  Access site: RCFA  Access closure: Perclose Proglide  Anesthesia: General anesthesia  Medication used: refer to anesthesia documentation for sedation medication.  Complications: Small SAH  Estimated blood loss: 200 mL  Specimen: None  Findings: Occluded right carotid bulb crossed with balloon guide catheter. Mechanical thrombectomy performed in the right MCA/M1 with combined stent retriever  (5mm solitaire) and aspiration with complete recanalization (TICI3). Embolus to right A3/ACA noted. Mechanical thrombectomy performed with a 3 mm stent retriever (solitaire) with complete recanalization with slow distal flow (TICI 2C). Slow flow noted in distal MCA branches w/o occlusion. Balloon guide catheter retracted to the level of the right common carotid artery. Angiogram showed densely calcified plaque with approximately 45-50% stenosis. Follow-up angiogram of the head showed brink opacification of both ACA and MCA vascular tree.  Post procedural flat panel CT showed a small to moderate size SAH in the right Sylvian fissure.  The patient tolerated the procedure well without incident or complication and is in stable condition.   Plan: - Transfer to ICU for continued care. - SBP 120-140 - Bed rest 6h post femoral access - Follow-up head CT within 4 hours to assess SAH stability.

## 2020-01-01 ENCOUNTER — Inpatient Hospital Stay (HOSPITAL_COMMUNITY): Payer: Medicaid Other

## 2020-01-01 DIAGNOSIS — E7800 Pure hypercholesterolemia, unspecified: Secondary | ICD-10-CM

## 2020-01-01 DIAGNOSIS — I639 Cerebral infarction, unspecified: Secondary | ICD-10-CM

## 2020-01-01 DIAGNOSIS — F17200 Nicotine dependence, unspecified, uncomplicated: Secondary | ICD-10-CM

## 2020-01-01 DIAGNOSIS — J9601 Acute respiratory failure with hypoxia: Secondary | ICD-10-CM

## 2020-01-01 DIAGNOSIS — I6521 Occlusion and stenosis of right carotid artery: Secondary | ICD-10-CM

## 2020-01-01 DIAGNOSIS — I351 Nonrheumatic aortic (valve) insufficiency: Secondary | ICD-10-CM

## 2020-01-01 DIAGNOSIS — E785 Hyperlipidemia, unspecified: Secondary | ICD-10-CM

## 2020-01-01 LAB — BASIC METABOLIC PANEL
Anion gap: 10 (ref 5–15)
BUN: 10 mg/dL (ref 6–20)
CO2: 19 mmol/L — ABNORMAL LOW (ref 22–32)
Calcium: 9.2 mg/dL (ref 8.9–10.3)
Chloride: 112 mmol/L — ABNORMAL HIGH (ref 98–111)
Creatinine, Ser: 0.86 mg/dL (ref 0.44–1.00)
GFR calc Af Amer: >60 mL/min (ref >60)
GFR calc non Af Amer: >60 mL/min (ref >60)
Glucose, Bld: 124 mg/dL — ABNORMAL HIGH (ref 70–99)
Potassium: 3.6 mmol/L (ref 3.5–5.1)
Sodium: 141 mmol/L (ref 135–145)

## 2020-01-01 LAB — POCT I-STAT 7, (LYTES, BLD GAS, ICA,H+H)
Acid-base deficit: 3 mmol/L — ABNORMAL HIGH (ref 0.0–2.0)
Bicarbonate: 21.6 mmol/L (ref 20.0–28.0)
Calcium, Ion: 1.32 mmol/L (ref 1.15–1.40)
HCT: 39 % (ref 36.0–46.0)
Hemoglobin: 13.3 g/dL (ref 12.0–15.0)
O2 Saturation: 88 %
Patient temperature: 98.3
Potassium: 3.4 mmol/L — ABNORMAL LOW (ref 3.5–5.1)
Sodium: 143 mmol/L (ref 135–145)
TCO2: 23 mmol/L (ref 22–32)
pCO2 arterial: 35.6 mmHg (ref 32.0–48.0)
pH, Arterial: 7.391 (ref 7.350–7.450)
pO2, Arterial: 55 mmHg — ABNORMAL LOW (ref 83.0–108.0)

## 2020-01-01 LAB — LIPID PANEL
Cholesterol: 184 mg/dL (ref 0–200)
HDL: 38 mg/dL — ABNORMAL LOW (ref >40)
LDL Cholesterol: 88 mg/dL (ref 0–99)
Total CHOL/HDL Ratio: 4.8 RATIO
Triglycerides: 291 mg/dL — ABNORMAL HIGH (ref <150)
VLDL: 58 mg/dL — ABNORMAL HIGH (ref 0–40)

## 2020-01-01 LAB — ECHOCARDIOGRAM COMPLETE
Height: 66 in
Weight: 3224.01 oz

## 2020-01-01 LAB — CBC
HCT: 41.8 % (ref 36.0–46.0)
Hemoglobin: 13.4 g/dL (ref 12.0–15.0)
MCH: 28.2 pg (ref 26.0–34.0)
MCHC: 32.1 g/dL (ref 30.0–36.0)
MCV: 88 fL (ref 80.0–100.0)
Platelets: 236 10*3/uL (ref 150–400)
RBC: 4.75 MIL/uL (ref 3.87–5.11)
RDW: 13.1 % (ref 11.5–15.5)
WBC: 12.3 10*3/uL — ABNORMAL HIGH (ref 4.0–10.5)
nRBC: 0 % (ref 0.0–0.2)

## 2020-01-01 LAB — RAPID URINE DRUG SCREEN, HOSP PERFORMED
Amphetamines: NOT DETECTED
Barbiturates: NOT DETECTED
Benzodiazepines: NOT DETECTED
Cocaine: NOT DETECTED
Opiates: NOT DETECTED
Tetrahydrocannabinol: NOT DETECTED

## 2020-01-01 LAB — HEMOGLOBIN A1C
Hgb A1c MFr Bld: 5.5 % (ref 4.8–5.6)
Mean Plasma Glucose: 111 mg/dL

## 2020-01-01 LAB — TROPONIN I (HIGH SENSITIVITY): Troponin I (High Sensitivity): 6 ng/L (ref <18)

## 2020-01-01 MED ORDER — LORAZEPAM 2 MG/ML IJ SOLN
1.0000 mg | Freq: Four times a day (QID) | INTRAMUSCULAR | Status: DC
  Administered 2020-01-02 (×2): 1 mg via INTRAVENOUS
  Filled 2020-01-01: qty 1

## 2020-01-01 MED ORDER — BUDESONIDE 0.25 MG/2ML IN SUSP
0.2500 mg | Freq: Two times a day (BID) | RESPIRATORY_TRACT | Status: DC
  Administered 2020-01-01 – 2020-01-07 (×14): 0.25 mg via RESPIRATORY_TRACT
  Filled 2020-01-01 (×15): qty 2

## 2020-01-01 MED ORDER — OSMOLITE 1.5 CAL PO LIQD
1000.0000 mL | ORAL | Status: DC
  Administered 2020-01-01 – 2020-01-08 (×8): 1000 mL
  Filled 2020-01-01 (×9): qty 1000

## 2020-01-01 MED ORDER — ATORVASTATIN CALCIUM 40 MG PO TABS: 40.0000 mg | ORAL_TABLET | Freq: Every day | ORAL | Status: DC

## 2020-01-01 MED ORDER — METOPROLOL TARTRATE 5 MG/5ML IV SOLN
2.5000 mg | INTRAVENOUS | Status: DC | PRN
  Administered 2020-01-01: 2.5 mg via INTRAVENOUS
  Filled 2020-01-01: qty 5

## 2020-01-01 MED ORDER — PRO-STAT SUGAR FREE PO LIQD
30.0000 mL | Freq: Two times a day (BID) | ORAL | Status: DC
  Administered 2020-01-01 – 2020-01-08 (×14): 30 mL
  Filled 2020-01-01 (×14): qty 30

## 2020-01-01 MED ORDER — LORAZEPAM 2 MG/ML IJ SOLN
INTRAMUSCULAR | Status: AC
  Filled 2020-01-01: qty 1

## 2020-01-01 MED ORDER — IPRATROPIUM-ALBUTEROL 0.5-2.5 (3) MG/3ML IN SOLN
3.0000 mL | RESPIRATORY_TRACT | Status: DC
  Administered 2020-01-01 – 2020-01-02 (×9): 3 mL via RESPIRATORY_TRACT
  Filled 2020-01-01 (×9): qty 3

## 2020-01-01 MED ORDER — CLOPIDOGREL BISULFATE 75 MG PO TABS
75.0000 mg | ORAL_TABLET | Freq: Every day | ORAL | Status: DC
  Administered 2020-01-02 – 2020-01-08 (×7): 75 mg
  Filled 2020-01-01 (×7): qty 1

## 2020-01-01 MED ORDER — CLOPIDOGREL BISULFATE 75 MG PO TABS: 75.0000 mg | ORAL_TABLET | Freq: Every day | ORAL | Status: DC

## 2020-01-01 MED ORDER — ASPIRIN 300 MG RE SUPP
300.0000 mg | Freq: Once | RECTAL | Status: AC
  Administered 2020-01-01: 300 mg via RECTAL
  Filled 2020-01-01: qty 1

## 2020-01-01 MED ORDER — POTASSIUM CHLORIDE IN NACL 20-0.9 MEQ/L-% IV SOLN
INTRAVENOUS | Status: DC
  Filled 2020-01-01: qty 1000

## 2020-01-01 MED ORDER — ASPIRIN 325 MG PO TABS: 325.0000 mg | ORAL_TABLET | Freq: Every day | ORAL | Status: DC

## 2020-01-01 MED ORDER — ENOXAPARIN SODIUM 40 MG/0.4ML ~~LOC~~ SOLN
40.0000 mg | SUBCUTANEOUS | Status: DC
  Administered 2020-01-01 – 2020-01-07 (×7): 40 mg via SUBCUTANEOUS
  Filled 2020-01-01 (×7): qty 0.4

## 2020-01-01 NOTE — Progress Notes (Signed)
Rehab Admissions Coordinator Note:  Per PT recommendation, this patient was screened by Raechel Ache for appropriateness for an Inpatient Acute Rehab Consult.  At this time, pt is not yet appropriate for an IP Rehab consult. Will need to see greater tolerance with therapies prior to requesting consult order. Will follow closely.   Raechel Ache 01/01/2020, 1:51 PM  I can be reached at [phone].

## 2020-01-01 NOTE — Evaluation (Signed)
Speech Language Pathology Evaluation Patient Details Name: Anne [name] MRN: [id number] DOB: [birth year] Today's Date: 01/01/2020 Time: 8119-1478 SLP Time Calculation (min) (ACUTE ONLY): 16 min  Problem List:  Patient Active Problem List   Diagnosis Date Noted  . Acute respiratory failure with hypoxia ([place])   . Stroke (cerebrum) ([place]) 12/31/2019  . CAD (coronary artery disease)   . Polysubstance abuse ([place])   . Hypertension   . Asthma   . Coronary vasospasm ([place])   . NSTEMI (non-ST elevated myocardial infarction) ([place]) 11/25/2014  . Chest pain 11/24/2014   Past Medical History:  Past Medical History:  Diagnosis Date  . Asthma   . CAD (coronary artery disease)    a. cardiac cath 02/04/2013: tubular 20% stenosis pRCA, tubular 30% stenosis mRCA, medically managed  . Coronary vasospasm (HCC)    a. cardiac cath 11/25/2014: mLCx 30%, pRCA 30%, moderate spasm w/ noted improvement w/ reimaging, recommended CCB, long acting nitrate, smoking cessation, & statin   . Hypertension   . Polysubstance abuse ([place])    a. remote history of crack/cocaine abuse approximately 8-10 years ago, ongoing tobacco abuse since age 43, rare etoh abuse   Past Surgical History:  Past Surgical History:  Procedure Laterality Date  . CARDIAC CATHETERIZATION N/A 11/25/2014   Procedure: Left Heart Cath and Coronary Angiography;  Surgeon: Minna Merritts, MD;  Location: [place] [facility];  Service: Cardiovascular;  Laterality: N/A;  . IR PERCUTANEOUS ART THROMBECTOMY/INFUSION INTRACRANIAL INC DIAG ANGIO  12/31/2019      . RADIOLOGY WITH ANESTHESIA Left 12/31/2019   Procedure: RADIOLOGY WITH ANESTHESIA;  Surgeon: Radiologist, Medication, MD;  Location: [facility];  Service: Radiology;  Laterality: Left;   HPI:  Pt is a 59 yo female presenting with L hemiplegia and R gaze preference. CT showed R MCA infarct. CTA revealed occlusion of R ICA. Pt is s/p thrombectomy on 6/17. PMH includes polysubstance abuse, HTN, coronary  vasospasms, CAD, asthma.   Assessment / Plan / Recommendation Clinical Impression  Pt is drowsy, responding best to stimulation from family, but still only maintaining a brief period of alertness. Her gaze stays to the R but she did address the left side of her body during self-care tasks when cued. Her sustained attention, problem solving, and awareness are impaired, but she does follow many one-step commands when she is more alert. Her speech is moderately dysarthric, with intelligibility improving when her volume increased when talking to family. PTA she was independent and would take care of her grandchildren. Recommend SLP f/u to maximize cognition and speech.     SLP Assessment  SLP Recommendation/Assessment: Patient needs continued Speech Lanaguage Pathology Services SLP Visit Diagnosis: Dysarthria and anarthria (R47.1);Cognitive communication deficit (R41.841)    Follow Up Recommendations  Inpatient Rehab    Frequency and Duration min 2x/week  2 weeks      SLP Evaluation Cognition  Overall Cognitive Status: Impaired/Different from baseline Arousal/Alertness: Lethargic Orientation Level: Oriented to person;Oriented to place;Oriented to situation Attention: Sustained Sustained Attention: Impaired Sustained Attention Impairment: Verbal basic Awareness: Impaired Awareness Impairment: Emergent impairment Problem Solving: Impaired Problem Solving Impairment: Functional basic       Comprehension  Auditory Comprehension Overall Auditory Comprehension: Impaired Commands: Impaired Conversation: Simple    Expression Expression Primary Mode of Expression: Verbal Verbal Expression Overall Verbal Expression: Other (comment) (limited output, needs more assessment)   Oral / Motor  Oral Motor/Sensory Function Overall Oral Motor/Sensory Function: Moderate impairment Facial ROM: Reduced left;Suspected CN VII (facial) dysfunction Facial Symmetry: Abnormal  symmetry left;Suspected CN  VII (facial) dysfunction Facial Strength: Reduced left;Suspected CN VII (facial) dysfunction Lingual ROM: Reduced left Lingual Symmetry: Abnormal symmetry right (reduced movement to her L) Mandible: Within Functional Limits Motor Speech Overall Motor Speech: Impaired Respiration: Within functional limits Phonation: Normal Resonance: Within functional limits Articulation: Impaired Level of Impairment: Word Intelligibility: Intelligibility reduced Word: 75-100% accurate Phrase: 25-49% accurate   GO                    [name]., M.A. [facility] Pager [phone] Office [phone]  01/01/2020, 11:43 AM

## 2020-01-01 NOTE — Progress Notes (Addendum)
Called for patient continued fever 2 hours after Tylenol. White count has trended higher. Also with HR trending higher. CXR this AM was negative for PNA.    DDx for fever includes infectious (UTI, URI) and noninfectious etiology such as EtOH withdrawal (patient drinks EtOH per H and P) and central fever (stroke).   Obtaining U/A and sputum culture. Starting a trial of scheduled Ativan 1 mg IV q6h x 8 doses. Restarting IVF (no CHF on echocardiogram). Next dose Tylenol is in 2 hours.   Addendum: Urinalysis c/w UTI. Starting ceftriaxone 1000 mg IV qd x 7 days.   Electronically signed: Dr. Kerney Elbe

## 2020-01-01 NOTE — Evaluation (Signed)
Occupational Therapy Evaluation Patient Details Name: Anne [name] MRN: [id number] DOB: 12/04/1960 Today's Date: 01/01/2020    History of Present Illness 59 y.o. female with history of polysubstance abuse-crack/cocaine, hypertension, coronary vasospasms and CAD.  Patient was brought to hospital as code stroke. Patient was found in the bathroom with the tub full and on the floor.  Patient was immediately brought to [facility] as code stroke from [place].  On arrival patient had a right gaze deviation and left-sided hemiplegia.  Initial CT head did reveal progressing stroke.  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  Patient was immediately brought back to IR for thrombectomy on 6/17.   Clinical Impression   .Pt admitted with above. She demonstrates the below listed deficits and will benefit from continued OT to maximize safety and independence with BADLs.  Pt seen in conjunction with PT.  Eval was limited by lethargy this date.  She followed one step commands ~30% of the time on her Rt side, none on her Lt side.  She demonstrates Lt neglect with Rt gaze preference as well as Lt hemiplegia.  Pt currently requires total A for all ADLs and total A +2 for functional mobility.  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  Recommend CIR level rehab once her arousal and participation improve.      Follow Up Recommendations  CIR    Equipment Recommendations  None recommended by OT    Recommendations for Other Services Rehab consult     Precautions / Restrictions Precautions Precautions: Fall Restrictions Weight Bearing Restrictions: No      Mobility Bed Mobility Overal bed mobility: Needs Assistance Bed Mobility: Rolling Rolling: Total assist         General bed mobility comments: Pt does not assist with  activity   Transfers                 General transfer comment: unable     Balance                                           ADL either performed or assessed with clinical judgement   ADL Overall ADL's : Needs assistance/impaired Eating/Feeding: NPO   Grooming: Wash/dry hands;Wash/dry face;Oral care;Brushing hair;Total assistance;Bed level   Upper Body Bathing: Total assistance;Bed level   Lower Body Bathing: Total assistance;Bed level   Upper Body Dressing : Total assistance;Bed level   Lower Body Dressing: Total assistance;Bed level   Toilet Transfer: Total assistance   Toileting- Clothing Manipulation and Hygiene: Total assistance;Bed level Toileting - Clothing Manipulation Details (indicate cue type and reason): Pt incontinent of urine - assisted with clean up and peri care at bed level      Functional mobility during ADLs: Total assistance;+2 for physical assistance General ADL Comments: Pt unable to enagage in ADLs      Vision   Additional Comments: Pt demonstrates Rt gaze preference      Perception     Praxis      Pertinent Vitals/Pain Pain Assessment: Faces Faces Pain Scale: No hurt     Hand Dominance Right   Extremity/Trunk Assessment Upper Extremity Assessment Upper Extremity Assessment: RUE deficits/detail;LUE deficits/detail RUE Deficits / Details: Pt moves Rt UE spontaneously  LUE Deficits /  Details: No active ROM noted.  PROM WFL  LUE Coordination: decreased fine motor;decreased gross motor   Lower Extremity Assessment Lower Extremity Assessment: Defer to PT evaluation RLE Deficits / Details: wiggles toes, flicker of activity in R quad/hip, otherwise limited AROM LLE Deficits / Details: flaccid LLE, no AROM, PROM WFL. Does withdraw to pain   Cervical / Trunk Assessment Cervical / Trunk Assessment: Other exceptions Cervical / Trunk Exceptions: neck in flexed and R lateral rotation, is able to mobilize with PROM to  midline and extend to neutral, returns to flexed and rotated position without assistance   Communication Communication Communication: Receptive difficulties;Expressive difficulties   Cognition Arousal/Alertness: Lethargic Behavior During Therapy: Flat affect Overall Cognitive Status: Difficult to assess                                 General Comments: pt inconsistnetly follows commands with R side, only attending to commands given from R side at this time. Pt maintaining arousal form 5-10 seconds at a time before becoming more lethargic   General Comments  pt with R gaze preference, does track to midline with significant cueing, very lethargic during session. Pt on 8L HFNC, sats from 92-96% during session.  [name] arrived at end of session.  Instructed her on current deficits, and once pt more alert, to speak with pt on pt's Lt side to improve attention to the LT     Exercises     Shoulder Instructions      Home Living Family/patient expects to be discharged to:: Private residence Living Arrangements: Other relatives Available Help at Discharge: Family;Available 24 hours/day Type of Home: House Home Access: Stairs to enter [employer] of Steps: 3 Entrance Stairs-Rails: None Home Layout: One level     Bathroom Shower/Tub: Corporate investment banker: Standard     Home Equipment: None          Prior Functioning/Environment Level of Independence: Independent        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 Problem List: Decreased strength;Decreased range of motion;Decreased activity tolerance;Impaired balance (sitting and/or standing);Impaired vision/perception;Decreased coordination;Decreased cognition;Decreased safety awareness;Decreased knowledge of use of DME or AE;Cardiopulmonary status limiting activity;Obesity;Impaired tone;Impaired UE functional use      OT Treatment/Interventions: Self-care/ADL  training;Neuromuscular education;Energy conservation;DME and/or AE instruction;Splinting;Therapeutic activities;Cognitive remediation/compensation;Manual therapy;Visual/perceptual remediation/compensation;Patient/family education;Balance training    OT Goals(Current goals can be found in the care plan section) Acute Rehab OT Goals Patient Stated Goal: Daughter hopes pt will improve her ability to take care of self  OT Goal Formulation: With patient/family Time For Goal Achievement: 01/15/20 Potential to Achieve Goals: Good ADL Goals Pt Will Perform Grooming: with min assist;sitting Pt Will Perform Upper Body Bathing: with mod assist;sitting Additional ADL Goal #1: Pt attend to simple ADL tasks x 5 mins with min cues Additional ADL Goal #2: Pt will locate items on Lt during ADLs wtih min cues  OT Frequency: Min 2X/week   Barriers to D/C:    daughter reports family will provide assist at discharge        Co-evaluation PT/OT/SLP Co-Evaluation/Treatment: Yes Reason for Co-Treatment: Complexity of the patient's impairments (multi-system involvement);Necessary to address cognition/behavior during functional activity PT goals addressed during session: Mobility/safety with mobility;Strengthening/ROM OT goals addressed during session: Strengthening/ROM      AM-PAC OT "6 Clicks" Daily Activity     Outcome Measure Help from another person eating  meals?: Total Help from another person taking care of personal grooming?: Total Help from another person toileting, which includes using toliet, bedpan, or urinal?: Total Help from another person bathing (including washing, rinsing, drying)?: Total Help from another person to put on and taking off regular upper body clothing?: Total Help from another person to put on and taking off regular lower body clothing?: Total 6 Click Score: 6   End of Session Equipment Utilized During Treatment: Oxygen Nurse Communication: Mobility status  Activity  Tolerance: Patient limited by lethargy Patient left: in bed;with call bell/phone within reach;with family/visitor present  OT Visit Diagnosis: Hemiplegia and hemiparesis Hemiplegia - Right/Left: Left Hemiplegia - dominant/non-dominant: Non-Dominant Hemiplegia - caused by: Cerebral infarction                Time: 1222-1257 OT Time Calculation (min): 35 min Charges:  OT General Charges $OT Visit: 1 Visit OT Evaluation $OT Eval Moderate Complexity: 1 Mod  Jonta Gastineau C., OTR/L Acute Rehabilitation Services Pager [phone] Office [phone]   Lucille Passy M 01/01/2020, 4:56 PM

## 2020-01-01 NOTE — H&P (Signed)
NAMEAriyah [name], MRN:  [id number], DOB:  April 03, 1961, LOS: 1 ADMISSION DATE:  12/31/2019, CONSULTATION DATE:  01/01/20 REFERRING MD:  Dr. Rory Percy, CHIEF COMPLAINT:  Respiratory distress   Brief History   Anne [name] is a 59 yo f with a PMHx of CAD, coronary vasospasm, HTN, polysubstance use (crack/cocaine) who presented with L hemiplegia and R-sided gaze deviation. 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 Underwent thrombectomy. She developed respiratory distress.  CCM was consuted.  History of present illness   Anne [name] is a 59 yo f with a PMHx of CAD, coronary vasospasm, HTN, polysubstance use (crack/cocaine) who presented to the hospital via EMS as a code stroke. Per chart review, patient was last normal at 9:30am on 6/17. She was found on the floor of the bathroom at home with the tub full. She was brought to [facility] and CT scan showed R infarct. She was not given tpa due to being outside of time window. She then underwent thrombectomy by IR.   Daughter reports that pt had been endorsing headaches over the last 2 weeks and had had little improvement with aspirin. She is functional at home with her ADLs.  Past Medical History  CAD Coronary vasospasm HTN Polysubstance use (cocaine/crack)  Significant Hospital Events   6/17 - admitted. Underwent mechanical thrombectomy  Consults:  CCM  Procedures:  6/17 > Intubated and later extubated 6/17 > Mechanical thrombectomy with recanalization of right M1 TICI 3, angioplasty of R carotid  Significant Diagnostic Tests:  CT Head 6/17 > Acute nonhemorrhagic R MCA infarct. Chronic L basal ganglia infarct  CTA head, CT with perfusion 6/17 > Occlusion of the R ICA at its origin in the neck and distal right M1 occlusion. Right MCA core infarct with penumbra. Mild left vertebral artery origin stenosis  MRI/MRA brain 6/17 > Evolving moderate to large sized acute ischemic  right MCA infarct. No hemorrhage or mass effect. Underlying chronic bilateral basal ganglia lacunar infarcts. Interval revascularization of previously occluded right ICA and MCA s/p thrombectomy. Severe mid-right M1 stenosis.  CXR 6/18 > negative  Micro Data:  MRSA 6/17 > neg COVID 6/17 > neg  Antimicrobials:  n/a  Interim history/subjective:  Patient is lying comfortably in bed. Further history is limited by mental status. Able to open eyes in response to voice and touch and following commands.   Objective   Blood pressure (!) 148/71, pulse 91, temperature 98.3 F (36.8 C), temperature source Axillary, resp. rate 20, height 5\' 6"  (1.676 m), weight 91.4 kg, SpO2 100 %.        Intake/Output Summary (Last 24 hours) at 01/01/2020 0924 Last data filed at 01/01/2020 0700 Gross per 24 hour  Intake 1217.83 ml  Output --  Net 1217.83 ml   Filed Weights   12/31/19 1546 12/31/19 1845  Weight: 93.5 kg 91.4 kg    Examination: General: resting adult female in bed, nasal cannula in place HENT: normocephalic, atraumatic Lungs: mild rhonchi bilaterally, normal respiratory effort, 97% sats on 10L [place] Cardiovascular: RRR, normal S1 and S2, no murmurs, rubs, gallops  Abdomen: soft, nondistended, active bowel sounds, no rebound or guarding Extremities: well-perfused, warm, no rashes Neuro: awakens to voice or touch, unable to maintain attention for more than a few seconds, opens eyes, following commands: squeezes hands, wiggles toes on R side and moves R upper and lower extremity. Remains hemiplegic on L side.  GU: foley in  place  Resolved Hospital Problem list     Assessment & Plan:  ASSESSMENT / PLAN:  PULMONARY  A:  Acute Respiratory Failure secondary to AMS, stroke Had desats to the 80s requiring a NRB. Subsequently desatted again to the 70s on [place] and had rhonchorous breath sounds. Received duonebs and per RN, respiratory effort improved substantially. Currently on 10L by [place]. From  outpatient med review, pt takes albuterol and advair at home.   P:   - Titrate O2 as needed - Duonebs q4h - If respiratory effort worsens, can use NRB  NEUROLOGIC A:   Acute right MCA infarct She is s/p mechanical thrombectomy of R ICA occlusion. Repeat head imaging showing revascularization.  P:   - Cleviprex infusion  - Aspirin - Lipitor 40mg  qd - F/u carotid US - Trend temp curve. Tylenol PRN for fever  CARDIAC  A: Essential HTN Coronary artery disease Hypertriglyceridemia P: - Control BP: cleviprex, lopressor, isoptin PRN - F/u echo  INFECTIOUS A:   No active issues. Continue to monitor  RENAL A:  No active issues.  Cr normal at 0.95. Will monitor renal function and IVF as above.   ELECTROLYTES A:  No active electrolyte abnormalities.  P: - Replete electrolytes as needed - mIVF with NS 75 mL/hr   GASTROINTESTINAL A:   No active issues  HEMATOLOGIC   A:  No active issues.  P:  - follow CBC - PRBC for hgb </= 6.9gm%    - exceptions are   -  if ACS susepcted/confirmed then transfuse for hgb </= 8.0gm%,  or    -  active bleeding with hemodynamic instability, then transfuse regardless of hemoglobin value   At at all times try to transfuse 1 unit prbc as possible with exception of active hemorrhage   ENDOCRINE A:   Elevated blood glucose without known diabetes Blood glucose in the low 100s.  P:   - SSI with blood glucose goal of 140-180 - F/u a1c  Best practice:  Diet: NPO Pain/Anxiety/Delirium protocol (if indicated): na VAP protocol (if indicated): na DVT prophylaxis: SCD GI prophylaxis: protonix Glucose control: SSI Mobility: bedridden Code Status: full Family Communication: attempted to call pt's daughter Disposition: ICU  Labs   CBC: Recent Labs  Lab 12/31/19 1510 12/31/19 1514  WBC  --  8.1  NEUTROABS  --  5.5  HGB 13.9 13.5  HCT 41.0 42.5  MCV  --  88.7  PLT  --  008    Basic Metabolic Panel: Recent Labs  Lab  12/31/19 1510 12/31/19 1514  NA 142 140  K 3.6 3.7  CL 110 110  CO2  --  21*  GLUCOSE 105* 108*  BUN 15 13  CREATININE 0.90 0.95  CALCIUM  --  9.8   GFR: Estimated Creatinine Clearance: 71.7 mL/min (by C-G formula based on SCr of 0.95 mg/dL). Recent Labs  Lab 12/31/19 1514  WBC 8.1    Liver Function Tests: Recent Labs  Lab 12/31/19 1514  AST 17  ALT 10  ALKPHOS 89  BILITOT 0.5  PROT 6.7  ALBUMIN 4.1   No results for input(s): LIPASE, AMYLASE in the last 168 hours. No results for input(s): AMMONIA in the last 168 hours.  ABG    Component Value Date/Time   TCO2 23 12/31/2019 1510     Coagulation Profile: Recent Labs  Lab 12/31/19 1514  INR 1.1    Cardiac Enzymes: No results for input(s): CKTOTAL, CKMB, CKMBINDEX, TROPONINI in the last 168 hours.  HbA1C: Hgb A1c MFr Bld  Date/Time Value Ref Range Status  11/25/2014 01:19 AM 5.5 4.0 - 6.0 % Final    CBG: Recent Labs  Lab 12/31/19 1515  GLUCAP 89    Review of Systems:   As above  Past Medical History  She,  has a past medical history of Asthma, CAD (coronary artery disease), Coronary vasospasm ([place]), Hypertension, and Polysubstance abuse ([place]).   Surgical History    Past Surgical History:  Procedure Laterality Date  . CARDIAC CATHETERIZATION N/A 11/25/2014   Procedure: Left Heart Cath and Coronary Angiography;  Surgeon: Minna Merritts, MD;  Location: [place] [facility];  Service: Cardiovascular;  Laterality: N/A;  . IR PERCUTANEOUS ART THROMBECTOMY/INFUSION INTRACRANIAL INC DIAG ANGIO  12/31/2019      . RADIOLOGY WITH ANESTHESIA Left 12/31/2019   Procedure: RADIOLOGY WITH ANESTHESIA;  Surgeon: Radiologist, Medication, MD;  Location: [place];  Service: Radiology;  Laterality: Left;     Social History   reports that she has been smoking. She has a 45.00 pack-year smoking history. She has never used smokeless tobacco. She reports current alcohol use. She reports that she does not use drugs.    Family History   Her family history includes Breast cancer (age of onset: 7) in her mother; CAD (age of onset: 46) in her father.   Allergies No Known Allergies   Home Medications  Prior to Admission medications   Medication Sig Start Date End Date Taking? Authorizing Provider  albuterol (PROVENTIL HFA;VENTOLIN HFA) 108 (90 BASE) MCG/ACT inhaler Inhale 2 puffs into the lungs every 6 (six) hours as needed for wheezing or shortness of breath. 11/25/14   Gladstone Lighter, MD  albuterol (PROVENTIL) (2.5 MG/3ML) 0.083% nebulizer solution Take 3 mLs (2.5 mg total) by nebulization every 6 (six) hours as needed for wheezing or shortness of breath. 11/25/14   Gladstone Lighter, MD  amLODipine (NORVASC) 5 MG tablet Take 1 tablet (5 mg total) by mouth daily. 11/03/16   Carrie Mew, MD  aspirin EC 81 MG EC tablet Take 1 tablet (81 mg total) by mouth daily. Patient not taking: Reported on 11/03/2016 11/25/14   Gladstone Lighter, MD  cetirizine (ZYRTEC) 10 MG tablet Take 10 mg by mouth daily as needed for allergies.     [provider]  diltiazem (CARDIZEM CD) 120 MG 24 hr capsule Take 1 capsule (120 mg total) by mouth daily. Patient not taking: Reported on 11/03/2016 11/25/14   Gladstone Lighter, MD  esomeprazole (NEXIUM) 20 MG capsule Take 20 mg by mouth daily at 12 noon.    [provider]  famotidine (PEPCID) 20 MG tablet Take 1 tablet (20 mg total) by mouth 2 (two) times daily. 11/03/16   Carrie Mew, MD  Fluticasone-Salmeterol (ADVAIR) 250-50 MCG/DOSE AEPB Inhale 1 puff into the lungs 2 (two) times daily. Patient not taking: Reported on 11/03/2016 11/25/14   Gladstone Lighter, MD  isosorbide mononitrate (IMDUR) 30 MG 24 hr tablet Take 1 tablet (30 mg total) by mouth daily. 11/03/16   Carrie Mew, MD  lisinopril (PRINIVIL,ZESTRIL) 10 MG tablet Take 10 mg by mouth daily.    [provider]  metoCLOPramide (REGLAN) 10 MG tablet Take 1 tablet (10 mg total) by  mouth every 6 (six) hours as needed. 11/03/16   Carrie Mew, MD  nitroGLYCERIN (NITROSTAT) 0.4 MG SL tablet Place 1 tablet (0.4 mg total) under the tongue every 5 (five) minutes as needed for chest pain. Patient not taking: Reported on 11/03/2016 11/25/14  Gladstone Lighter, MD  ondansetron (ZOFRAN ODT) 4 MG disintegrating tablet Take 1 tablet (4 mg total) by mouth every 8 (eight) hours as needed for nausea or vomiting. 11/03/16   Carrie Mew, MD     Critical care time:      Acquanetta Sit, MS4

## 2020-01-01 NOTE — Progress Notes (Signed)
Echocardiogram 2D Echocardiogram has been performed.  Oneal Deputy [name] 01/01/2020, 11:45 AM

## 2020-01-01 NOTE — Procedures (Signed)
Cortrak  Person Inserting Tube:  Tanylah Schnoebelen E, RD Tube Type:  Cortrak - 43 inches Tube Location:  Left nare Initial Placement:  Stomach Secured by: Bridle Technique Used to Measure Tube Placement:  Documented cm marking at nare/ corner of mouth Cortrak Secured At:  60 cm    Cortrak Tube Team Note:  Consult received to place a Cortrak feeding tube.   No x-ray is required. RN may begin using tube.   If the tube becomes dislodged please keep the tube and contact the Cortrak team at [email or web address] ([email or web address]) for replacement.  If after hours and replacement cannot be delayed, place a NG tube and confirm placement with an abdominal x-ray.    Fathima Bartl, MS, RD, LDN RD pager number and weekend/on-call pager number located in Amion.   

## 2020-01-01 NOTE — Progress Notes (Signed)
OT Cancellation Note  Patient Details Name: Virgilene Stryker MRN: [id number] DOB: 16-May-1961   Cancelled Treatment:    Reason Eval/Treat Not Completed: Medical issues which prohibited therapy.  Increased 02 requirements.  Will reattempt later as appropriate.  Nilsa Nutting., OTR/L Acute Rehabilitation Services Pager [phone] Office [phone]   Lucille Passy M 01/01/2020, 9:38 AM

## 2020-01-01 NOTE — Progress Notes (Signed)
STROKE TEAM PROGRESS NOTE   INTERVAL HISTORY Her RN and speech therapist are at the bedside.  Overnight, she had respiratory distress, desating, CXR negative, RR did Duoneb and suctioning, and pt respiratory issue improved. Currently lethargic but able to open eyes on pain, and able to answer questions with severely dysarthric voice. Still not moving on the left UE and LE with right gaze preference. MRI showed right MCA scattered infarct, not enlarged comparing with CTA core. MRA still showing right M1 severe stenosis and CUS showed right ICA 80-99% stenosis.  OBJECTIVE Vitals:   01/01/20 0500 01/01/20 0530 01/01/20 0600 01/01/20 0700  BP: 134/67 (!) 150/65 (!) 152/64 (!) 147/65  Pulse: 88 95 96 92  Resp: (!) 21 19 18 17   Temp:      TempSrc:      SpO2: 95% 90% 91% (!) 89%  Weight:      Height:        CBC:  Recent Labs  Lab 12/31/19 1510 12/31/19 1514  WBC  --  8.1  NEUTROABS  --  5.5  HGB 13.9 13.5  HCT 41.0 42.5  MCV  --  88.7  PLT  --  993    Basic Metabolic Panel:  Recent Labs  Lab 12/31/19 1510 12/31/19 1514  NA 142 140  K 3.6 3.7  CL 110 110  CO2  --  21*  GLUCOSE 105* 108*  BUN 15 13  CREATININE 0.90 0.95  CALCIUM  --  9.8    Lipid Panel:     Component Value Date/Time   CHOL 184 01/01/2020 0559   TRIG 291 (H) 01/01/2020 0559   HDL 38 (L) 01/01/2020 0559   CHOLHDL 4.8 01/01/2020 0559   VLDL 58 (H) 01/01/2020 0559   LDLCALC 88 01/01/2020 0559   HgbA1c:  Lab Results  Component Value Date   HGBA1C 5.5 11/25/2014   Urine Drug Screen: No results found for: LABOPIA, COCAINSCRNUR, LABBENZ, AMPHETMU, THCU, LABBARB  Alcohol Level No results found for: ETH  IMAGING  MR ANGIO HEAD WO CONTRAST 12/31/2019 IMPRESSION:   MRI HEAD  IMPRESSION:  1. Technically limited exam due to motion artifact.  2. Evolving moderate to large sized acute ischemic right MCA territory infarct as above. No associated hemorrhage or mass effect.  3. Underlying chronic  bilateral basal ganglia lacunar infarcts, with additional small chronic left parietal infarct.   MRA HEAD  IMPRESSION:  1. Technically limited exam due to extensive motion artifact.  2. Interval revascularization of previously occluded right ICA and MCA status post catheter directed thrombectomy.  3. Underlying short-segment 3 mm severe mid right M1 stenosis.  4. Otherwise grossly stable intracranial MRA as compared to prior CTA.   CT CEREBRAL PERFUSION W CONTRAST 12/31/2019 IMPRESSION:  1. 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  2. Distal right M1 occlusion.  3. 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  4. Mild left vertebral artery origin stenosis.  5. Aortic Atherosclerosis (ICD10-I70.0) and Emphysema (ICD10-J43.9).   CT HEAD CODE STROKE WO CONTRAST 12/31/2019 IMPRESSION:  1. Acute nonhemorrhagic right MCA infarct.  2. ASPECTS is 6.  3. Chronic left basal ganglia infarct.   CT ANGIO HEAD CODE STROKE CT ANGIO NECK CODE STROKE 12/31/2019 IMPRESSION:  1. 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  2. Distal right M1 occlusion.  3. 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  4. Mild left vertebral artery  origin stenosis.  5. Aortic Atherosclerosis (ICD10-I70.0) and Emphysema (ICD10-J43.9).   DG Chest Port 1 View 12/31/2019 IMPRESSION:  Right mid lung atelectasis or scarring.  No active disease.   Neuro Interventional Radiology - Cerebral Angiogram with Intervention - Dr. Erven Colla de Sindy Messing 12/31/2019 Occluded right carotid bulb crossed with balloon guide catheter. Mechanical thrombectomy performed in the right MCA/M1 with combined stent retriever  (34mm solitaire) and aspiration with complete recanalization (TICI3). Embolus to right A3/ACA noted. Mechanical thrombectomy performed with a 3 mm stent retriever (solitaire) with complete recanalization with slow distal flow  (TICI 2C). Slow flow noted in distal MCA branches w/o occlusion. Balloon guide catheter retracted to the level of the right common carotid artery. Angiogram showed densely calcified plaque with approximately 45-50% stenosis. Follow-up angiogram of the head showed brink opacification of both ACA and MCA vascular tree.  Transthoracic Echocardiogram  1. Left ventricular ejection fraction, by estimation, is 65 to 70%. The  left ventricle has normal function. The left ventricle has no regional  wall motion abnormalities. There is mild concentric left ventricular  hypertrophy. Left ventricular diastolic  parameters are consistent with Grade II diastolic dysfunction  (pseudonormalization). Elevated left ventricular end-diastolic pressure.  2. Right ventricular systolic function is normal. The right ventricular  size is normal. There is normal pulmonary artery systolic pressure.  3. Left atrial size was mildly dilated.  4. The mitral valve is normal in structure. No evidence of mitral valve  regurgitation. No evidence of mitral stenosis.  5. The aortic valve is normal in structure. Aortic valve regurgitation is  mild. No aortic stenosis is present.  6. The inferior vena cava is normal in size with greater than 50%  respiratory variability, suggesting right atrial pressure of 3 mmHg.   PHYSICAL EXAM  Temp:  [97.4 F (36.3 C)-98.3 F (36.8 C)] 98.3 F (36.8 C) (06/18 0800) Pulse Rate:  [50-115] 115 (06/18 1000) Resp:  [13-27] 19 (06/18 1000) BP: (84-185)/(51-144) 119/65 (06/18 1000) SpO2:  [89 %-100 %] 93 % (06/18 1000) Weight:  [91.4 kg-93.5 kg] 91.4 kg (06/17 1845)  General - Well nourished, well developed, lethargic and drowsy  Ophthalmologic - fundi not visualized due to noncooperation.  Cardiovascular - Regular rhythm and rate.  Neuro - lethargic and drowsy, but open eyes on pain. Orientated to place, self and age, but not to time. Severe dysarthria. Paucity of speech, not  cooperative with naming or repeating, but following simple commands. Right gaze preference, not cross midline. Blinking to visual threat on the right consistently, but not consistent on the left. PERRL. Left facial droop. Tongue midline. RUE and RLE spontaenous movement and against gravity. However, LUE 0/5 with pain and LLE 2/5 withdraw with pain. DTR 1+ and no babinski. Sensation and coordination not cooperative and gait not tested.     ASSESSMENT/PLAN Anne [name] is a 59 y.o. female with history of polysubstance abuse-crack/cocaine, tobacco use, hypertension, coronary vasospasms, CAD and recent headaches  found on the bathroom floor with right gaze deviation and left-sided hemiplegia.  She did not receive IV t-PA due to late presentation (>4.5 hours from time of onset).  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 ASPECTS is 6. Chronic left basal ganglia infarct.   CTA H&N - Occlusion of the right ICA at its origin in the neck with intracranial reconstitution. Distal  right M1 occlusion. Mild left vertebral artery origin stenosis.   CT Perfusion - Right MCA core infarct with penumbra  IR s/p thrombectomy with TICI 3 reperfusion and R ICA angioplasty   MRI head - Evolving moderate sized acute ischemic right MCA territory infarct as above. No associated hemorrhage or mass effect. Underlying chronic bilateral basal ganglia lacunar infarcts, with additional small chronic left parietal infarct.  MRA head - Interval revascularization of previously occluded right ICA and MCA status post catheter directed thrombectomy. Underlying short-segment 3 mm severe mid right M1 stenosis.   Carotid Doppler - R ICA 80-99% stenosis  2D Echo EF 65 to 70%  [employer] Virus 2 - negative  LDL - 88  HgbA1c - pending  UDS neg  VTE prophylaxis -  lovenox  aspirin 81 mg daily prior to admission, now on ASA 325 and plavix 75 DAPT due to severe right ICA stenosis.   Patient counseled to be compliant with her antithrombotic medications  Ongoing aggressive stroke risk factor management  Therapy recommendations:  pending  Disposition:  Pending  Right ICA stenosis  CTA head and neck showed right ICA occlusion at origin  Status post right ICA angioplasty  Carotid Doppler post procedure right ICA 80 to 99% stenosis  On DAPT  Will need to discuss with Dr. Katherina Right Rodriguze regarding right ICA stenting once pt stabilized from current stroke  Respiratory distress  Overnight desaturation  On 10 L nasal cannula  CCM on board  Aggressive suctioning and nebulization  Much improved  CXR x2 negative  Close monitoring  Hypertension  Home BP meds: Norvasc ; Cardizem ; Lisinopril ; Imdur  On Cleviprex  SBP mildly high at times . SBP 120 - 140 for 24 hours post procedure . Long-term BP goal normotensive  Hyperlipidemia  Home Lipid lowering medication: none   LDL 88, goal < 70  Current lipid lowering medication: add Lipitor 40 mg daily   Continue statin at discharge  Tobacco abuse  Current smoker  Smoking cessation counseling will be provided  Other Stroke Risk Factors  Advanced age  ETOH use, advised to drink no more than 1 alcoholic beverage per day.  Obesity, Body mass index is 32.52 kg/m., recommend weight loss, diet and exercise as appropriate   Coronary artery disease  Previous strokes by imaging  Other Active Problems  Code status - Full code  Aortic Atherosclerosis (ICD10-I70.0)   Emphysema (ICD10-J43.9).   Leukocytosis WBC 12.3  Hospital day # 1  This patient is critically ill due to right MCA stroke, right ICA and MCA occlusion, status post thrombectomy, respiratory distress, dysphagia and at significant risk of neurological worsening, death form recurrent stroke, right ICA  occlusion, seizure, hemorrhagic conversion, respiratory failure, aspiration. This patient's care requires constant monitoring of vital signs, hemodynamics, respiratory and cardiac monitoring, review of multiple databases, neurological assessment, discussion with family, other specialists and medical decision making of high complexity. I spent 45 minutes of neurocritical care time in the care of this patient.  I discussed with CCM NP Shirlee Limerick.  Rosalin Hawking, MD PhD Stroke Neurology 01/01/2020 8:30 PM   To contact Stroke Continuity provider, please refer to [email or web address] After hours, contact General Neurology

## 2020-01-01 NOTE — Progress Notes (Signed)
Initial Nutrition Assessment  RD working remotely.  DOCUMENTATION CODES:   Obesity unspecified  INTERVENTION:   Tube feeding via gastric Cortrak: - Osmolite 1.5 @ 50 ml/hr (1200 ml/day) - Pro-stat 30 ml BID - Free water per MD  Tube feeding regimen provides 2000 kcal, 105 grams of protein, and 914 ml of H2O (meets 100% of estimated needs).   NUTRITION DIAGNOSIS:   Inadequate oral intake related to lethargy/confusion as evidenced by NPO status.  GOAL:   Patient will meet greater than or equal to 90% of their needs  MONITOR:   Diet advancement, Labs, Weight trends, TF tolerance  REASON FOR ASSESSMENT:   Consult Enteral/tube feeding initiation and management  ASSESSMENT:   59 year old female who presented on 6/17 with acute stroke. CT head showed evolving stroke, CTA head and neck showed occlusion of the right internal carotid artery and MCA. PMH of polysubstance abuse, HTN, CAD, coronary vasospasms.   6/17 - s/p thrombectomy by IR 6/18 - Cortrak placement  Pt failed swallow evaluation by SLP this AM. Cortrak placed, tip in stomach per Cortrak team. Received consult for tube feeding initiation and management.  Reviewed weight history in chart. Pt with a 4.8 kg weight loss since 05/22/19. This is a 5% weight loss which is not significant for timeframe.  Medications reviewed and include: Cleviprex @ 42 ml/hr (provides 2016 kcal from lipid) IVF: NS @ 50 ml/hr  Labs reviewed: potassium 3.4  NUTRITION - FOCUSED PHYSICAL EXAM:  Unable to complete at this time. RD working remotely.  Diet Order:   Diet Order            Diet NPO time specified  Diet effective now                 EDUCATION NEEDS:   No education needs have been identified at this time  Skin:  Skin Assessment: Skin Integrity Issues: Incisions: groin  Last BM:  no documented BM  Height:   Ht Readings from Last 1 Encounters:  12/31/19 5\' 6"  (1.676 m)    Weight:   Wt Readings from Last 1  Encounters:  12/31/19 91.4 kg    Ideal Body Weight:  59.1 kg  BMI:  Body mass index is 32.52 kg/m.  Estimated Nutritional Needs:   Kcal:  1800-2000  Protein:  95-110 grams  Fluid:  >/= 1.8 L    Gaynell Face, MS, RD, LDN Inpatient Clinical Dietitian Pager: [phone] Weekend/After Hours: [phone]

## 2020-01-01 NOTE — Progress Notes (Addendum)
Carotid duplex has been completed.   Preliminary results in CV Proc.   Abram Sander 01/01/2020 11:34 AM

## 2020-01-01 NOTE — Progress Notes (Signed)
Referring Physician(s): * No referring provider recorded for this case *  Supervising Physician: Pedro Earls  Patient Status:  [facility] - In-pt  Chief Complaint: Code Stroke  Subjective: Arousable, but minimal participation in exam this AM.  Possible left-sided neglect. Moves right side to command.  No noted movement on the left.  10L [place] this AM after hypoxia overnight, CCM following.   Allergies: Patient has no known allergies.  Medications: Prior to Admission medications   Medication Sig Start Date End Date Taking? Authorizing Provider  albuterol (PROVENTIL HFA;VENTOLIN HFA) 108 (90 BASE) MCG/ACT inhaler Inhale 2 puffs into the lungs every 6 (six) hours as needed for wheezing or shortness of breath. 11/25/14   Gladstone Lighter, MD  albuterol (PROVENTIL) (2.5 MG/3ML) 0.083% nebulizer solution Take 3 mLs (2.5 mg total) by nebulization every 6 (six) hours as needed for wheezing or shortness of breath. 11/25/14   Gladstone Lighter, MD  amLODipine (NORVASC) 5 MG tablet Take 1 tablet (5 mg total) by mouth daily. 11/03/16   Carrie Mew, MD  aspirin EC 81 MG EC tablet Take 1 tablet (81 mg total) by mouth daily. Patient not taking: Reported on 11/03/2016 11/25/14   Gladstone Lighter, MD  cetirizine (ZYRTEC) 10 MG tablet Take 10 mg by mouth daily as needed for allergies.     [provider]  diltiazem (CARDIZEM CD) 120 MG 24 hr capsule Take 1 capsule (120 mg total) by mouth daily. Patient not taking: Reported on 11/03/2016 11/25/14   Gladstone Lighter, MD  esomeprazole (NEXIUM) 20 MG capsule Take 20 mg by mouth daily at 12 noon.    [provider]  famotidine (PEPCID) 20 MG tablet Take 1 tablet (20 mg total) by mouth 2 (two) times daily. 11/03/16   Carrie Mew, MD  Fluticasone-Salmeterol (ADVAIR) 250-50 MCG/DOSE AEPB Inhale 1 puff into the lungs 2 (two) times daily. Patient not taking: Reported on 11/03/2016 11/25/14   Gladstone Lighter, MD    isosorbide mononitrate (IMDUR) 30 MG 24 hr tablet Take 1 tablet (30 mg total) by mouth daily. 11/03/16   Carrie Mew, MD  lisinopril (PRINIVIL,ZESTRIL) 10 MG tablet Take 10 mg by mouth daily.    [provider]  metoCLOPramide (REGLAN) 10 MG tablet Take 1 tablet (10 mg total) by mouth every 6 (six) hours as needed. 11/03/16   Carrie Mew, MD  nitroGLYCERIN (NITROSTAT) 0.4 MG SL tablet Place 1 tablet (0.4 mg total) under the tongue every 5 (five) minutes as needed for chest pain. Patient not taking: Reported on 11/03/2016 11/25/14   Gladstone Lighter, MD  ondansetron (ZOFRAN ODT) 4 MG disintegrating tablet Take 1 tablet (4 mg total) by mouth every 8 (eight) hours as needed for nausea or vomiting. 11/03/16   Carrie Mew, MD     Vital Signs: BP (!) 151/66   Pulse 96   Temp 98.6 F (37 C) (Axillary)   Resp 16   Ht 5\' 6"  (1.676 m)   Wt 201 lb 8 oz (91.4 kg)   SpO2 95%   BMI 32.52 kg/m   Physical Exam  NAD, on 10L [place], arouses but quickly falls asleep Neuro: opens eyes spontaneously to voice, but quickly falls asleep.  Does not track voice or presence to the left. No movement noted in left hand or lower extremity but question whether this is limited due to lack of effort vs. True weakness.  MSK: Groin soft. Procedure site intact. No evidence of hematoma or pseudoaneurysm.  Distal pulses palpable.  Imaging: MR ANGIO HEAD WO CONTRAST  Result Date: 12/31/2019 CLINICAL DATA:  Follow-up examination for acute stroke. EXAM: MRI HEAD WITHOUT CONTRAST MRA HEAD WITHOUT CONTRAST TECHNIQUE: Multiplanar, multiecho pulse sequences of the brain and surrounding structures were obtained without intravenous contrast. Angiographic images of the head were obtained using MRA technique without contrast. COMPARISON:  Prior CTs from earlier the same day. FINDINGS: MRI HEAD FINDINGS Brain: Examination technically limited due to motion artifact and the patient's inability to tolerate the full  length of the exam. Generalized age-related cerebral atrophy. Mild patchy chronic microvascular ischemic changes noted within the pons. Remote lacunar infarcts noted involving the left greater than right basal ganglia. Associated small amount of hemosiderin staining noted about the chronic left basal ganglia lacunar infarct. Additional small cortical subcortical infarct noted at the left parietal lobe (series 13, image 19). Patchy and confluent restricted diffusion seen involving the right MCA distribution, with involvement of the right insula, operculum, and overlying right frontal parietal cortex. Mild patchy involvement of the right basal ganglia and deep cerebral white matter noted as well. No associated hemorrhage or mass effect. No other evidence for acute or subacute ischemia. Gray-white matter differentiation otherwise maintained. No other evidence for acute or chronic intracranial hemorrhage. No mass lesion, midline shift or mass effect. No hydrocephalus or extra-axial fluid collection. Vascular: Major intracranial vascular flow voids are maintained. Skull and upper cervical spine: Craniocervical junction grossly within normal limits, although lack of a true sagittal T1 sequence somewhat limits evaluation. Bone marrow signal intensity within normal limits. No scalp soft tissue abnormality. Sinuses/Orbits: Globes and orbital soft tissues within normal limits. Chronic right maxillary sinusitis noted, with additional chronic mucoperiosteal thickening in opacity throughout the remaining paranasal sinuses. No significant mastoid effusion. Inner ear structures grossly normal. Other: None. MRA HEAD FINDINGS ANTERIOR CIRCULATION: Examination degraded by motion artifact. Distal cervical right ICA is now patent status post catheter directed intervention. Right ICA remains patent to the terminus without appreciable stenosis or other abnormality. Left ICA also widely patent to the terminus without appreciable stenosis.  A1 segments patent, right A1 is hypoplastic, accounting for the slightly diminutive right ICA is compared to the left. Grossly normal anterior communicating artery complex. Anterior cerebral arteries grossly patent proximally, not well evaluated distally due to motion. Right M1 segment patent proximally. Focal severe stenosis of the mid right M1 segment noted (series 1047, image 12). This measures approximately 3 mm in length. Finding grossly similar to prior CTA. Right MCA bifurcation not well assessed due to motion. Distal right MCA branches grossly perfused and patent. Left M1 segment remains widely patent. Normal left MCA bifurcation. Distal left MCA branches grossly perfused, although not well assessed due to motion. POSTERIOR CIRCULATION: Vertebral arteries grossly patent to the bifurcation with the left being dominant. Neither PICA well visualized. Basilar remains grossly patent to its distal aspect without stenosis. Superior cerebral arteries grossly patent proximally. PCA supplied via the basilar as well as robust bilateral posterior communicating arteries. PCAs remain grossly patent to their distal aspects without obvious stenosis. IMPRESSION: MRI HEAD IMPRESSION: 1. Technically limited exam due to motion artifact. 2. Evolving moderate to large sized acute ischemic right MCA territory infarct as above. No associated hemorrhage or mass effect. 3. Underlying chronic bilateral basal ganglia lacunar infarcts, with additional small chronic left parietal infarct. MRA HEAD IMPRESSION: 1. Technically limited exam due to extensive motion artifact. 2. Interval revascularization of previously occluded right ICA and MCA status post catheter directed thrombectomy. 3. Underlying short-segment 3 mm  severe mid right M1 stenosis. 4. Otherwise grossly stable intracranial MRA as compared to prior CTA. Electronically Signed   By: Jeannine Boga M.D.   On: 12/31/2019 22:05   MR BRAIN WO CONTRAST  Result Date:  12/31/2019 CLINICAL DATA:  Follow-up examination for acute stroke. EXAM: MRI HEAD WITHOUT CONTRAST MRA HEAD WITHOUT CONTRAST TECHNIQUE: Multiplanar, multiecho pulse sequences of the brain and surrounding structures were obtained without intravenous contrast. Angiographic images of the head were obtained using MRA technique without contrast. COMPARISON:  Prior CTs from earlier the same day. FINDINGS: MRI HEAD FINDINGS Brain: Examination technically limited due to motion artifact and the patient's inability to tolerate the full length of the exam. Generalized age-related cerebral atrophy. Mild patchy chronic microvascular ischemic changes noted within the pons. Remote lacunar infarcts noted involving the left greater than right basal ganglia. Associated small amount of hemosiderin staining noted about the chronic left basal ganglia lacunar infarct. Additional small cortical subcortical infarct noted at the left parietal lobe (series 13, image 19). Patchy and confluent restricted diffusion seen involving the right MCA distribution, with involvement of the right insula, operculum, and overlying right frontal parietal cortex. Mild patchy involvement of the right basal ganglia and deep cerebral white matter noted as well. No associated hemorrhage or mass effect. No other evidence for acute or subacute ischemia. Gray-white matter differentiation otherwise maintained. No other evidence for acute or chronic intracranial hemorrhage. No mass lesion, midline shift or mass effect. No hydrocephalus or extra-axial fluid collection. Vascular: Major intracranial vascular flow voids are maintained. Skull and upper cervical spine: Craniocervical junction grossly within normal limits, although lack of a true sagittal T1 sequence somewhat limits evaluation. Bone marrow signal intensity within normal limits. No scalp soft tissue abnormality. Sinuses/Orbits: Globes and orbital soft tissues within normal limits. Chronic right maxillary  sinusitis noted, with additional chronic mucoperiosteal thickening in opacity throughout the remaining paranasal sinuses. No significant mastoid effusion. Inner ear structures grossly normal. Other: None. MRA HEAD FINDINGS ANTERIOR CIRCULATION: Examination degraded by motion artifact. Distal cervical right ICA is now patent status post catheter directed intervention. Right ICA remains patent to the terminus without appreciable stenosis or other abnormality. Left ICA also widely patent to the terminus without appreciable stenosis. A1 segments patent, right A1 is hypoplastic, accounting for the slightly diminutive right ICA is compared to the left. Grossly normal anterior communicating artery complex. Anterior cerebral arteries grossly patent proximally, not well evaluated distally due to motion. Right M1 segment patent proximally. Focal severe stenosis of the mid right M1 segment noted (series 1047, image 12). This measures approximately 3 mm in length. Finding grossly similar to prior CTA. Right MCA bifurcation not well assessed due to motion. Distal right MCA branches grossly perfused and patent. Left M1 segment remains widely patent. Normal left MCA bifurcation. Distal left MCA branches grossly perfused, although not well assessed due to motion. POSTERIOR CIRCULATION: Vertebral arteries grossly patent to the bifurcation with the left being dominant. Neither PICA well visualized. Basilar remains grossly patent to its distal aspect without stenosis. Superior cerebral arteries grossly patent proximally. PCA supplied via the basilar as well as robust bilateral posterior communicating arteries. PCAs remain grossly patent to their distal aspects without obvious stenosis. IMPRESSION: MRI HEAD IMPRESSION: 1. Technically limited exam due to motion artifact. 2. Evolving moderate to large sized acute ischemic right MCA territory infarct as above. No associated hemorrhage or mass effect. 3. Underlying chronic bilateral basal  ganglia lacunar infarcts, with additional small chronic left parietal infarct. MRA  HEAD IMPRESSION: 1. Technically limited exam due to extensive motion artifact. 2. Interval revascularization of previously occluded right ICA and MCA status post catheter directed thrombectomy. 3. Underlying short-segment 3 mm severe mid right M1 stenosis. 4. Otherwise grossly stable intracranial MRA as compared to prior CTA. Electronically Signed   By: Jeannine Boga M.D.   On: 12/31/2019 22:05   CT CEREBRAL PERFUSION W CONTRAST  Result Date: 12/31/2019 CLINICAL DATA:  Code stroke.  Left hemiparesis and rightward gaze. EXAM: CT ANGIOGRAPHY HEAD AND NECK CT PERFUSION BRAIN TECHNIQUE: Multidetector CT imaging of the head and neck was performed using the standard protocol during bolus administration of intravenous contrast. Multiplanar CT image reconstructions and MIPs were obtained to evaluate the vascular anatomy. Carotid stenosis measurements (when applicable) are obtained utilizing NASCET criteria, using the distal internal carotid diameter as the denominator. Multiphase CT imaging of the brain was performed following IV bolus contrast injection. Subsequent parametric perfusion maps were calculated using RAPID software. CONTRAST:  167mL OMNIPAQUE IOHEXOL 350 MG/ML SOLN COMPARISON:  None. FINDINGS: CTA NECK FINDINGS Aortic arch: Standard 3 vessel aortic arch with minimal atherosclerotic plaque. Widely patent arch vessel origins. Right carotid system: Patent common carotid artery. Extensive soft and calcified plaque at the carotid bifurcation with occlusion of the ICA at its origin and without reconstitution in the neck. Left carotid system: Patent with mild calcified and soft plaque at the carotid bifurcation. No evidence of significant stenosis or dissection. Vertebral arteries: Patent with the left being mildly to moderately dominant. Calcified plaque at the left vertebral artery origin results in mild stenosis. No  right-sided stenosis. Skeleton: Mild cervical spondylosis. Other neck: No evidence of cervical lymphadenopathy or mass. Upper chest: Mild centrilobular emphysema. Review of the MIP images confirms the above findings CTA HEAD FINDINGS Anterior circulation: There is intracranial reconstitution of the right ICA with the vessel appearing patent from the petrous segment through the carotid terminus but with diffusely diminished contrast enhancement and with potential superimposed moderate atherosclerotic narrowing of the cavernous and proximal supraclinoid segments. The left intracranial ICA is patent with at most mild cavernous segment stenosis due to atherosclerotic plaque. The right M1 segment is patent but attenuated proximally and is occluded distally near the bifurcation. There is faint opacification of some right MCA superior division branch vessels without evidence of significant inferior division collateralization on this single phase CTA. The ACAs and left MCA are patent. There are mild right and mild-to-moderate left proximal A1 stenoses, and there is a mild proximal left M1 stenosis. No aneurysm is identified. Posterior circulation: The intracranial vertebral arteries are widely patent to the basilar. A patent right PICA, left AICA, and bilateral SCA origins are visualized. The basilar artery is patent with mild irregular narrowing proximally. There are right larger than left posterior communicating arteries. Both PCAs are patent with branch vessel irregularity but no evidence of a significant proximal stenosis. No aneurysm is identified. Venous sinuses: As permitted by contrast timing, patent. Anatomic variants: None. Review of the MIP images confirms the above findings CT Brain Perfusion Findings: ASPECTS: 6 CBF (<30%) Volume: 53 mL Perfusion (Tmax>6.0s) volume: 204 mL Mismatch Volume: 151 mL Infarction Location: Right MCA territory IMPRESSION: 1. 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 2. Distal right M1 occlusion. 3. 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 4. Mild left vertebral artery origin stenosis. 5. Aortic Atherosclerosis (ICD10-I70.0) and Emphysema (ICD10-J43.9). These results were communicated to Dr. Rory Percy at 3:32 p.m. on  12/31/2019 by text page via the [facility] messaging system. Electronically Signed   By: Logan Bores M.D.   On: 12/31/2019 16:02   DG Chest Port 1 View  Result Date: 01/01/2020 CLINICAL DATA:  59 year old female with respiratory distress. EXAM: PORTABLE CHEST 1 VIEW COMPARISON:  Portable chest 12/31/2019 and earlier. FINDINGS: Portable AP semi upright view at 0728 hours. Lung volumes and mediastinal contours remain within normal limits. The patient is mildly rotated to the right. Visualized tracheal air column is within normal limits. Allowing for portable technique the lungs are clear. No pneumothorax or pleural effusion. Negative visible bowel gas and osseous structures. IMPRESSION: Negative portable chest. Electronically Signed   By: Genevie Ann M.D.   On: 01/01/2020 07:54   DG Chest Port 1 View  Result Date: 12/31/2019 CLINICAL DATA:  Stroke EXAM: PORTABLE CHEST 1 VIEW COMPARISON:  04/15/2018 FINDINGS: Right mid lung atelectasis or scarring. Heart is normal size. Left lung clear. No effusions or acute bony abnormality. IMPRESSION: Right mid lung atelectasis or scarring.  No active disease. Electronically Signed   By: Rolm Baptise M.D.   On: 12/31/2019 19:29   ECHOCARDIOGRAM COMPLETE  Result Date: 01/01/2020    ECHOCARDIOGRAM REPORT   Patient Name:   LISETTE MANCEBO Date of Exam: 01/01/2020 Medical Rec #:  [id number]      Height:       66.0 in Accession #:    [id number]     Weight:       201.5 lb Date of Birth:  [birth year]      BSA:          2.006 m Patient Age:    59 years       BP:           146/129 mmHg Patient Gender: F              HR:           85 bpm. Exam Location:  Inpatient Procedure: 2D Echo, Color Doppler and Cardiac  Doppler Indications:    Stroke i163.9  History:        Patient has no prior history of Echocardiogram examinations.                 CAD; Risk Factors:Hypertension.  Sonographer:    Raquel Sarna Senior RDCS Referring Phys: [place]  1. Left ventricular ejection fraction, by estimation, is 65 to 70%. The left ventricle has normal function. The left ventricle has no regional wall motion abnormalities. There is mild concentric left ventricular hypertrophy. Left ventricular diastolic parameters are consistent with Grade II diastolic dysfunction (pseudonormalization). Elevated left ventricular end-diastolic pressure.  2. Right ventricular systolic function is normal. The right ventricular size is normal. There is normal pulmonary artery systolic pressure.  3. Left atrial size was mildly dilated.  4. The mitral valve is normal in structure. No evidence of mitral valve regurgitation. No evidence of mitral stenosis.  5. The aortic valve is normal in structure. Aortic valve regurgitation is mild. No aortic stenosis is present.  6. The inferior vena cava is normal in size with greater than 50% respiratory variability, suggesting right atrial pressure of 3 mmHg. FINDINGS  Left Ventricle: Left ventricular ejection fraction, by estimation, is 65 to 70%. The left ventricle has normal function. The left ventricle has no regional wall motion abnormalities. The left ventricular internal cavity size was normal in size. There is  mild concentric left ventricular hypertrophy. Left ventricular diastolic parameters are consistent with  Grade II diastolic dysfunction (pseudonormalization). Elevated left ventricular end-diastolic pressure. Right Ventricle: The right ventricular size is normal. No increase in right ventricular wall thickness. Right ventricular systolic function is normal. There is normal pulmonary artery systolic pressure. The tricuspid regurgitant velocity is 2.85 m/s, and  with an assumed right atrial  pressure of 3 mmHg, the estimated right ventricular systolic pressure is 44.9 mmHg. Left Atrium: Left atrial size was mildly dilated. Right Atrium: Right atrial size was normal in size. Pericardium: There is no evidence of pericardial effusion. Mitral Valve: The mitral valve is normal in structure. Normal mobility of the mitral valve leaflets. No evidence of mitral valve regurgitation. No evidence of mitral valve stenosis. Tricuspid Valve: The tricuspid valve is normal in structure. Tricuspid valve regurgitation is not demonstrated. No evidence of tricuspid stenosis. Aortic Valve: The aortic valve is normal in structure. Aortic valve regurgitation is mild. Aortic regurgitation PHT measures 308 msec. No aortic stenosis is present. Aortic valve mean gradient measures 8.0 mmHg. Aortic valve peak gradient measures 17.8 mmHg. Aortic valve area, by VTI measures 1.99 cm. Pulmonic Valve: The pulmonic valve was normal in structure. Pulmonic valve regurgitation is not visualized. No evidence of pulmonic stenosis. Aorta: The aortic root is normal in size and structure. Venous: The inferior vena cava is normal in size with greater than 50% respiratory variability, suggesting right atrial pressure of 3 mmHg. IAS/Shunts: No atrial level shunt detected by color flow Doppler.  LEFT VENTRICLE PLAX 2D LVIDd:         3.80 cm  Diastology LVIDs:         2.20 cm  LV e' lateral:   9.57 cm/s LV PW:         1.20 cm  LV E/e' lateral: 10.7 LV IVS:        1.20 cm  LV e' medial:    6.42 cm/s LVOT diam:     1.90 cm  LV E/e' medial:  15.9 LV SV:         79 LV SV Index:   40 LVOT Area:     2.84 cm  LEFT ATRIUM             Index       RIGHT ATRIUM           Index LA diam:        3.90 cm 1.94 cm/m  RA Area:     13.10 cm LA Vol (A2C):   46.7 ml 23.28 ml/m RA Volume:   28.50 ml  14.21 ml/m LA Vol (A4C):   31.5 ml 15.70 ml/m LA Biplane Vol: 38.9 ml 19.39 ml/m  AORTIC VALVE AV Area (Vmax):    1.85 cm AV Area (Vmean):   2.04 cm AV Area (VTI):      1.99 cm AV Vmax:           211.00 cm/s AV Vmean:          132.000 cm/s AV VTI:            0.399 m AV Peak Grad:      17.8 mmHg AV Mean Grad:      8.0 mmHg LVOT Vmax:         138.00 cm/s LVOT Vmean:        95.200 cm/s LVOT VTI:          0.280 m LVOT/AV VTI ratio: 0.70 AI PHT:            308 msec  AORTA Ao  Root diam: 2.70 cm Ao Asc diam:  3.00 cm MITRAL VALVE                TRICUSPID VALVE MV Area (PHT): 2.69 cm     TR Peak grad:   32.5 mmHg MV Decel Time: 282 msec     TR Vmax:        285.00 cm/s MV E velocity: 102.00 cm/s MV A velocity: 104.00 cm/s  SHUNTS MV E/A ratio:  0.98         Systemic VTI:  0.28 m                             Systemic Diam: 1.90 cm Dani Gobble Croitoru MD Electronically signed by Sanda Klein MD Signature Date/Time: 01/01/2020/12:26:52 PM    Final    CT HEAD CODE STROKE WO CONTRAST  Result Date: 12/31/2019 CLINICAL DATA:  Code stroke.  Left hemiparesis and rightward gaze. EXAM: CT HEAD WITHOUT CONTRAST TECHNIQUE: Contiguous axial images were obtained from the base of the skull through the vertex without intravenous contrast. COMPARISON:  None. FINDINGS: Brain: There is hypodensity involving the right insula, right frontal and parietal operculum, and a small amount of the lateral right frontal lobe superior to the operculum. There is a chronic infarct in the left basal ganglia with ex vacuo dilatation of the left frontal horn. No acute intracranial hemorrhage, midline shift, or extra-axial fluid collection is identified. Vascular: Calcified atherosclerosis at the skull base. Hyperdense right MCA in the sylvian fissure with CTA pending. Skull: No fracture or suspicious osseous lesion. Sinuses/Orbits: Suspected prior right maxillary antrostomy and partial ethmoidectomy. Complete opacification of the right maxillary sinus with prominent sclerosis of the sinus walls and a small amount of soft tissue extending into the right nasal cavity. Clear mastoid air cells. Rightward gaze. Other: None.  ASPECTS [facility] Stroke Program Early CT Score) - Ganglionic level infarction (caudate, lentiform nuclei, internal capsule, insula, M1-M3 cortex): 4 - Supraganglionic infarction (M4-M6 cortex): 2 Total score (0-10 with 10 being normal): 6 IMPRESSION: 1. Acute nonhemorrhagic right MCA infarct. 2. ASPECTS is 6. 3. Chronic left basal ganglia infarct. These results were called by telephone at the time of interpretation on 12/31/2019 at 3:12 p.m. to Dr. Rory Percy, who verbally acknowledged these results. Electronically Signed   By: Logan Bores M.D.   On: 12/31/2019 15:47   VAS US CAROTID  Result Date: 01/01/2020 Carotid Arterial Duplex Study Indications:       CVA and evaluate right ICA stenosis post angrioplasty. Risk Factors:      Hypertension, coronary artery disease. Limitations        Today's exam was limited due to patient positioning. Comparison Study:  no prior Performing Technologist: Abram Sander RVS  Examination Guidelines: A complete evaluation includes B-mode imaging, spectral Doppler, color Doppler, and power Doppler as needed of all accessible portions of each vessel. Bilateral testing is considered an integral part of a complete examination. Limited examinations for reoccurring indications may be performed as noted.  Right Carotid Findings: +----------+--------+--------+--------+------------------+--------------+           PSV cm/sEDV cm/sStenosisPlaque DescriptionComments       +----------+--------+--------+--------+------------------+--------------+ ICA Prox  576     241     80-99%  calcific                         +----------+--------+--------+--------+------------------+--------------+ ICA Mid   60      16                                               +----------+--------+--------+--------+------------------+--------------+  ICA Distal                                          not visualized +----------+--------+--------+--------+------------------+--------------+ ECA        408     20                                               +----------+--------+--------+--------+------------------+--------------+ +----------+--------+-------+--------------+-------------------+           PSV cm/sEDV cmsDescribe      Arm Pressure (mmHG) +----------+--------+-------+--------------+-------------------+ Subclavian               Not identified                    +----------+--------+-------+--------------+-------------------+ +---------+--------+--------+--------------+ VertebralPSV cm/sEDV cm/sNot identified +---------+--------+--------+--------------+  Left Carotid Findings: +----------+--------+--------+--------+------------------+--------+           PSV cm/sEDV cm/sStenosisPlaque DescriptionComments +----------+--------+--------+--------+------------------+--------+ CCA Prox  170     21              heterogenous               +----------+--------+--------+--------+------------------+--------+ CCA Distal141     26              heterogenous               +----------+--------+--------+--------+------------------+--------+ ICA Prox  151     28      1-39%   heterogenous               +----------+--------+--------+--------+------------------+--------+ ICA Distal106     47                                         +----------+--------+--------+--------+------------------+--------+ ECA       209     16                                         +----------+--------+--------+--------+------------------+--------+ +----------+--------+--------+--------+-------------------+           PSV cm/sEDV cm/sDescribeArm Pressure (mmHG) +----------+--------+--------+--------+-------------------+ [id number]                                         +----------+--------+--------+--------+-------------------+ +---------+--------+--------+--------------+ VertebralPSV cm/sEDV cm/sNot identified +---------+--------+--------+--------------+    Summary: Right Carotid: Velocities in the right ICA are consistent with a 80-99%                stenosis. Left Carotid: Velocities in the left ICA are consistent with a 1-39% stenosis. Vertebrals: Bilateral vertebral arteries were not visualized. *See table(s) above for measurements and observations.     Preliminary    CT ANGIO HEAD CODE STROKE  Result Date: 12/31/2019 CLINICAL DATA:  Code stroke.  Left hemiparesis and rightward gaze. EXAM: CT ANGIOGRAPHY HEAD AND NECK CT PERFUSION BRAIN TECHNIQUE: Multidetector CT imaging of the head and neck was performed using the standard protocol during bolus administration of intravenous contrast. Multiplanar CT image reconstructions and MIPs were obtained to evaluate the vascular anatomy. Carotid stenosis measurements (  when applicable) are obtained utilizing NASCET criteria, using the distal internal carotid diameter as the denominator. Multiphase CT imaging of the brain was performed following IV bolus contrast injection. Subsequent parametric perfusion maps were calculated using RAPID software. CONTRAST:  132mL OMNIPAQUE IOHEXOL 350 MG/ML SOLN COMPARISON:  None. FINDINGS: CTA NECK FINDINGS Aortic arch: Standard 3 vessel aortic arch with minimal atherosclerotic plaque. Widely patent arch vessel origins. Right carotid system: Patent common carotid artery. Extensive soft and calcified plaque at the carotid bifurcation with occlusion of the ICA at its origin and without reconstitution in the neck. Left carotid system: Patent with mild calcified and soft plaque at the carotid bifurcation. No evidence of significant stenosis or dissection. Vertebral arteries: Patent with the left being mildly to moderately dominant. Calcified plaque at the left vertebral artery origin results in mild stenosis. No right-sided stenosis. Skeleton: Mild cervical spondylosis. Other neck: No evidence of cervical lymphadenopathy or mass. Upper chest: Mild centrilobular emphysema. Review of the MIP  images confirms the above findings CTA HEAD FINDINGS Anterior circulation: There is intracranial reconstitution of the right ICA with the vessel appearing patent from the petrous segment through the carotid terminus but with diffusely diminished contrast enhancement and with potential superimposed moderate atherosclerotic narrowing of the cavernous and proximal supraclinoid segments. The left intracranial ICA is patent with at most mild cavernous segment stenosis due to atherosclerotic plaque. The right M1 segment is patent but attenuated proximally and is occluded distally near the bifurcation. There is faint opacification of some right MCA superior division branch vessels without evidence of significant inferior division collateralization on this single phase CTA. The ACAs and left MCA are patent. There are mild right and mild-to-moderate left proximal A1 stenoses, and there is a mild proximal left M1 stenosis. No aneurysm is identified. Posterior circulation: The intracranial vertebral arteries are widely patent to the basilar. A patent right PICA, left AICA, and bilateral SCA origins are visualized. The basilar artery is patent with mild irregular narrowing proximally. There are right larger than left posterior communicating arteries. Both PCAs are patent with branch vessel irregularity but no evidence of a significant proximal stenosis. No aneurysm is identified. Venous sinuses: As permitted by contrast timing, patent. Anatomic variants: None. Review of the MIP images confirms the above findings CT Brain Perfusion Findings: ASPECTS: 6 CBF (<30%) Volume: 53 mL Perfusion (Tmax>6.0s) volume: 204 mL Mismatch Volume: 151 mL Infarction Location: Right MCA territory IMPRESSION: 1. 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 2. Distal right M1 occlusion. 3. 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 4. Mild left vertebral artery origin stenosis. 5. Aortic Atherosclerosis  (ICD10-I70.0) and Emphysema (ICD10-J43.9). These results were communicated to Dr. Rory Percy at 3:32 p.m. on 12/31/2019 by text page via the [facility] messaging system. Electronically Signed   By: Logan Bores M.D.   On: 12/31/2019 16:02   CT ANGIO NECK CODE STROKE  Result Date: 12/31/2019 CLINICAL DATA:  Code stroke.  Left hemiparesis and rightward gaze. EXAM: CT ANGIOGRAPHY HEAD AND NECK CT PERFUSION BRAIN TECHNIQUE: Multidetector CT imaging of the head and neck was performed using the standard protocol during bolus administration of intravenous contrast. Multiplanar CT image reconstructions and MIPs were obtained to evaluate the vascular anatomy. Carotid stenosis measurements (when applicable) are obtained utilizing NASCET criteria, using the distal internal carotid diameter as the denominator. Multiphase CT imaging of the brain was performed following IV bolus contrast injection. Subsequent parametric perfusion maps were calculated using RAPID software.  CONTRAST:  129mL OMNIPAQUE IOHEXOL 350 MG/ML SOLN COMPARISON:  None. FINDINGS: CTA NECK FINDINGS Aortic arch: Standard 3 vessel aortic arch with minimal atherosclerotic plaque. Widely patent arch vessel origins. Right carotid system: Patent common carotid artery. Extensive soft and calcified plaque at the carotid bifurcation with occlusion of the ICA at its origin and without reconstitution in the neck. Left carotid system: Patent with mild calcified and soft plaque at the carotid bifurcation. No evidence of significant stenosis or dissection. Vertebral arteries: Patent with the left being mildly to moderately dominant. Calcified plaque at the left vertebral artery origin results in mild stenosis. No right-sided stenosis. Skeleton: Mild cervical spondylosis. Other neck: No evidence of cervical lymphadenopathy or mass. Upper chest: Mild centrilobular emphysema. Review of the MIP images confirms the above findings CTA HEAD FINDINGS Anterior circulation: There is  intracranial reconstitution of the right ICA with the vessel appearing patent from the petrous segment through the carotid terminus but with diffusely diminished contrast enhancement and with potential superimposed moderate atherosclerotic narrowing of the cavernous and proximal supraclinoid segments. The left intracranial ICA is patent with at most mild cavernous segment stenosis due to atherosclerotic plaque. The right M1 segment is patent but attenuated proximally and is occluded distally near the bifurcation. There is faint opacification of some right MCA superior division branch vessels without evidence of significant inferior division collateralization on this single phase CTA. The ACAs and left MCA are patent. There are mild right and mild-to-moderate left proximal A1 stenoses, and there is a mild proximal left M1 stenosis. No aneurysm is identified. Posterior circulation: The intracranial vertebral arteries are widely patent to the basilar. A patent right PICA, left AICA, and bilateral SCA origins are visualized. The basilar artery is patent with mild irregular narrowing proximally. There are right larger than left posterior communicating arteries. Both PCAs are patent with branch vessel irregularity but no evidence of a significant proximal stenosis. No aneurysm is identified. Venous sinuses: As permitted by contrast timing, patent. Anatomic variants: None. Review of the MIP images confirms the above findings CT Brain Perfusion Findings: ASPECTS: 6 CBF (<30%) Volume: 53 mL Perfusion (Tmax>6.0s) volume: 204 mL Mismatch Volume: 151 mL Infarction Location: Right MCA territory IMPRESSION: 1. 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 2. Distal right M1 occlusion. 3. 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 4. Mild left vertebral artery origin stenosis. 5. Aortic Atherosclerosis (ICD10-I70.0) and Emphysema (ICD10-J43.9). These results were communicated to Dr.  Rory Percy at 3:32 p.m. on 12/31/2019 by text page via the [facility] messaging system. Electronically Signed   By: Logan Bores M.D.   On: 12/31/2019 16:02    Labs:  CBC: Recent Labs    12/31/19 1510 12/31/19 1514 01/01/20 1010 01/01/20 1153  WBC  --  8.1 12.3*  --   HGB 13.9 13.5 13.4 13.3  HCT 41.0 42.5 41.8 39.0  PLT  --  208 236  --     COAGS: Recent Labs    12/31/19 1514  INR 1.1  APTT 29    BMP: Recent Labs    12/31/19 1510 12/31/19 1514 01/01/20 1010 01/01/20 1153  NA 142 140 141 143  K 3.6 3.7 3.6 3.4*  CL 110 110 112*  --   CO2  --  21* 19*  --   GLUCOSE 105* 108* 124*  --   BUN 15 13 10   --   CALCIUM  --  9.8 9.2  --   CREATININE 0.90  0.95 0.86  --   GFRNONAA  --  >60 >60  --   GFRAA  --  >60 >60  --     LIVER FUNCTION TESTS: Recent Labs    12/31/19 1514  BILITOT 0.5  AST 17  ALT 10  ALKPHOS 89  PROT 6.7  ALBUMIN 4.1    Assessment and Plan: Mechanical thrombectomy performed in the right MCA/M1 with combined stent retriever  (39mm solitaire) and aspiration with complete recanalization (TICI3). Embolus to right A3/ACA noted. Mechanical thrombectomy performed with a 3 mm stent retriever (solitaire) with complete recanalization with slow distal flow (TICI 2C).  Follow-up angiogram of the head showed brink opacification of both ACA and MCA vascular tree. Patient assessed alongside Dr. Karenann Cai this AM.   Patient awakens to stimulation/voice, but with very limited participation in examination.  Follows commands, but not consistently.  Possible left-sided neglect? Groin soft. No evidence of hematoma or pseudoaneurysm.  SLP assessment planned, however already with increased respiratory effort and requiring increased supplemental oxygen.  CCM following.  NIR following.   Electronically Signed: Docia Barrier, PA 01/01/2020, 3:09 PM   I spent a total of 15 Minutes at the the patient's bedside AND on the patient's hospital floor or unit,  greater than 50% of which was counseling/coordinating care for R MCA CVA.

## 2020-01-01 NOTE — Evaluation (Signed)
Clinical/Bedside Swallow Evaluation Patient Details  Name: Anne [name] MRN: [id number] Date of Birth: 11-15-1960  Today's Date: 01/01/2020 Time: SLP Start Time (ACUTE ONLY): 6195 SLP Stop Time (ACUTE ONLY): 1015 SLP Time Calculation (min) (ACUTE ONLY): 17 min  Past Medical History:  Past Medical History:  Diagnosis Date  . Asthma   . CAD (coronary artery disease)    a. cardiac cath 02/04/2013: tubular 20% stenosis pRCA, tubular 30% stenosis mRCA, medically managed  . Coronary vasospasm (HCC)    a. cardiac cath 11/25/2014: mLCx 30%, pRCA 30%, moderate spasm w/ noted improvement w/ reimaging, recommended CCB, long acting nitrate, smoking cessation, & statin   . Hypertension   . Polysubstance abuse ([place])    a. remote history of crack/cocaine abuse approximately 8-10 years ago, ongoing tobacco abuse since age 28, rare etoh abuse   Past Surgical History:  Past Surgical History:  Procedure Laterality Date  . CARDIAC CATHETERIZATION N/A 11/25/2014   Procedure: Left Heart Cath and Coronary Angiography;  Surgeon: Minna Merritts, MD;  Location: [facility];  Service: Cardiovascular;  Laterality: N/A;  . IR PERCUTANEOUS ART THROMBECTOMY/INFUSION INTRACRANIAL INC DIAG ANGIO  12/31/2019      . RADIOLOGY WITH ANESTHESIA Left 12/31/2019   Procedure: RADIOLOGY WITH ANESTHESIA;  Surgeon: Radiologist, Medication, MD;  Location: [facility];  Service: Radiology;  Laterality: Left;   HPI:  Pt is a 59 yo female presenting with L hemiplegia and R gaze preference. CT showed R MCA infarct. CTA revealed occlusion of R ICA. Pt is s/p thrombectomy on 6/17. PMH includes polysubstance abuse, HTN, coronary vasospasms, CAD, asthma.   Assessment / Plan / Recommendation Clinical Impression  Pt is drowsy but breathing more easily on 10L via HFNC compared to earlier this morning per RN. She becomes more alert when engaged in self-feeding, but with significant anterior loss when taking cup sips. Pt had better  containment with a straw but immediate coughing is noted, and she quickly falls back asleep between boluses. Recommend NPO pending improved alertness and respirations, at which time she may also need MBS.  SLP Visit Diagnosis: Dysphagia, unspecified (R13.10)    Aspiration Risk  Severe aspiration risk (while lethargic)    Diet Recommendation NPO   Medication Administration: Via alternative means    Other  Recommendations Oral Care Recommendations: Oral care QID Other Recommendations: Have oral suction available   Follow up Recommendations Inpatient Rehab      Frequency and Duration min 2x/week  2 weeks       Prognosis Prognosis for Safe Diet Advancement: Good Barriers to Reach Goals: Cognitive deficits      Swallow Study   General HPI: Pt is a 59 yo female presenting with L hemiplegia and R gaze preference. CT showed R MCA infarct. CTA revealed occlusion of R ICA. Pt is s/p thrombectomy on 6/17. PMH includes polysubstance abuse, HTN, coronary vasospasms, CAD, asthma. Type of Study: Bedside Swallow Evaluation Previous Swallow Assessment: none in chart Diet Prior to this Study: NPO Temperature Spikes Noted: No Respiratory Status: Nasal cannula (10L) History of Recent Intubation:  (for procedure only on 6/17) Behavior/Cognition: Lethargic/Drowsy;Cooperative;Requires cueing Oral Cavity Assessment: Within Functional Limits Oral Care Completed by SLP: Yes Oral Cavity - Dentition: Adequate natural dentition;Missing dentition Vision: Functional for self-feeding Self-Feeding Abilities: Needs assist Patient Positioning: Upright in bed Baseline Vocal Quality: Normal Volitional Cough: Strong Volitional Swallow: Unable to elicit    Oral/Motor/Sensory Function Overall Oral Motor/Sensory Function: Moderate impairment Facial ROM: Reduced left;Suspected CN VII (facial) dysfunction  Facial Symmetry: Abnormal symmetry left;Suspected CN VII (facial) dysfunction Facial Strength: Reduced  left;Suspected CN VII (facial) dysfunction Lingual ROM: Reduced left Lingual Symmetry: Abnormal symmetry right (does not move it left) Mandible: Within Functional Limits   Ice Chips Ice chips: Impaired Presentation: Spoon Oral Phase Functional Implications: Oral holding;Other (comment) (suctioned from mouth)   Thin Liquid Thin Liquid: Impaired Presentation: Cup;Self Fed;Straw Oral Phase Impairments: Reduced labial seal;Poor awareness of bolus Pharyngeal  Phase Impairments: Cough - Immediate    Nectar Thick Nectar Thick Liquid: Not tested   Honey Thick Honey Thick Liquid: Not tested   Puree Puree: Not tested   Solid     Solid: Not tested      [name]., M.A. [place] Pager [phone] Office [phone]  01/01/2020,11:30 AM

## 2020-01-01 NOTE — Progress Notes (Addendum)
Desatted from 95% on face mask at 2L to 85%. O2 was increased to 6L and she is now satting at 88-89%.  Breath sounds are rhonchorous. Aspiration PNA is on the DDx. PE felt to be unlikely.   STAT CXR and Respiratory Therapy consult ordered.   Electronically signed: Dr. Kerney Elbe

## 2020-01-01 NOTE — Evaluation (Signed)
Physical Therapy Evaluation Patient Details Name: Anne [name] MRN: [id number] DOB: May 13, 1961 Today's Date: 01/01/2020   History of Present Illness  59 y.o. female with history of polysubstance abuse-crack/cocaine, hypertension, coronary vasospasms and CAD.  Patient was brought to hospital as code stroke. Patient was found in the bathroom with the tub full and on the floor.  Patient was immediately brought to [facility] as code stroke from [place].  On arrival patient had a right gaze deviation and left-sided hemiplegia.  Initial CT head did reveal progressing stroke.  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  Patient was immediately brought back to IR for thrombectomy on 6/17.  Clinical Impression  Pt presents to PT with deficits in arousal, functional mobility, cognition, communication, balance, strength, power, endurance. Pt follows commands inconsistently with RUE and RLE, no AROM noted on L side. Pt with R gaze preference and only attending to R side at this time, no command following when given from left side. Pt tolerates rolling in bed and bed in chair mode well, however does fatigue quickly with even less consistent command following by the end of session. Pt will benefit from continued acute PT POC to improve mobility and reduce caregiver burden. PT recommends CIR at this time as the pt was independent and working prior to admission.    Follow Up Recommendations CIR (will need to improve activity tolerance and arousal)    Equipment Recommendations  Wheelchair (measurements PT);Wheelchair cushion (measurements PT);Hospital bed (mechanical lift)    Recommendations for Other Services       Precautions / Restrictions Precautions Precautions: Fall Restrictions Weight Bearing Restrictions: No      Mobility  Bed Mobility Overal bed mobility: Needs  Assistance Bed Mobility: Rolling Rolling: Total assist            Transfers                    Ambulation/Gait                Stairs            Wheelchair Mobility    Modified Rankin (Stroke Patients Only)       Balance                                             Pertinent Vitals/Pain Pain Assessment: Faces Faces Pain Scale: No hurt    Home Living Family/patient expects to be discharged to:: Private residence Living Arrangements: Other relatives Available Help at Discharge: Family;Available 24 hours/day Type of Home: House Home Access: Stairs to enter Entrance Stairs-Rails: None Entrance Stairs-Number of Steps: 3 Home Layout: One level Home Equipment: None      Prior Function Level of Independence: Independent         Comments: works Occupational psychologist        Extremity/Trunk Assessment   Upper Extremity Assessment Upper Extremity Assessment: Defer to OT evaluation    Lower Extremity Assessment Lower Extremity Assessment: RLE deficits/detail;LLE deficits/detail RLE Deficits / Details: wiggles toes, flicker of activity in R quad/hip, otherwise limited AROM LLE Deficits / Details: flaccid LLE, no AROM, PROM WFL. Does withdraw to pain    Cervical / Trunk Assessment Cervical / Trunk Assessment: Other exceptions Cervical /  Trunk Exceptions: neck in flexed and R lateral rotation, is able to mobilize with PROM to midline and extend to neutral, returns to flexed and rotated position without assistance  Communication   Communication: Receptive difficulties;Expressive difficulties  Cognition Arousal/Alertness: Lethargic Behavior During Therapy: Flat affect Overall Cognitive Status: Difficult to assess                                 General Comments: pt inconsistnetly follows commands with R side, only attending to commands given from R side at this time. Pt maintaining arousal  form 5-10 seconds at a time before becoming more lethargic      General Comments General comments (skin integrity, edema, etc.): pt with R gaze preference, does track to midline with significant cueing, very lethargic during session. Pt on 8L HFNC, sats from 92-96% during session    Exercises     Assessment/Plan    PT Assessment Patient needs continued PT services  PT Problem List Decreased strength;Decreased activity tolerance;Decreased balance;Decreased mobility;Decreased coordination;Decreased cognition;Decreased knowledge of use of DME;Decreased safety awareness;Cardiopulmonary status limiting activity;Decreased knowledge of precautions;Impaired tone       PT Treatment Interventions DME instruction;Gait training;Stair training;Functional mobility training;Therapeutic activities;Therapeutic exercise;Balance training;Neuromuscular re-education;Cognitive remediation;Patient/family education;Wheelchair mobility training    PT Goals (Current goals can be found in the Care Plan section)  Acute Rehab PT Goals Patient Stated Goal: Pt unable to state, daughter's goal to improve mobility PT Goal Formulation: With family Time For Goal Achievement: 01/15/20 Potential to Achieve Goals: Fair    Frequency Min 4X/week   Barriers to discharge        Co-evaluation PT/OT/SLP Co-Evaluation/Treatment: Yes Reason for Co-Treatment: Complexity of the patient's impairments (multi-system involvement);Necessary to address cognition/behavior during functional activity;For patient/therapist safety PT goals addressed during session: Mobility/safety with mobility;Strengthening/ROM         AM-PAC PT "6 Clicks" Mobility  Outcome Measure Help needed turning from your back to your side while in a flat bed without using bedrails?: Total Help needed moving from lying on your back to sitting on the side of a flat bed without using bedrails?: Total Help needed moving to and from a bed to a chair (including a  wheelchair)?: Total Help needed standing up from a chair using your arms (e.g., wheelchair or bedside chair)?: Total Help needed to walk in hospital room?: Total Help needed climbing 3-5 steps with a railing? : Total 6 Click Score: 6    End of Session Equipment Utilized During Treatment: Oxygen Activity Tolerance: Patient limited by lethargy Patient left: in bed;with call bell/phone within reach;with family/visitor present;with bed alarm set Nurse Communication: Mobility status;Need for lift equipment PT Visit Diagnosis: Other abnormalities of gait and mobility (R26.89);Other symptoms and signs involving the nervous system (R29.898)    Time: 2122-4825 PT Time Calculation (min) (ACUTE ONLY): 34 min   Charges:   PT Evaluation $PT Eval Moderate Complexity: 1 Mod          Zenaida Niece, PT, DPT Acute Rehabilitation Pager: [phone]   Zenaida Niece 01/01/2020, 1:33 PM

## 2020-01-02 DIAGNOSIS — T17908A Unspecified foreign body in respiratory tract, part unspecified causing other injury, initial encounter: Secondary | ICD-10-CM

## 2020-01-02 LAB — COMPREHENSIVE METABOLIC PANEL
ALT: 9 U/L (ref 0–44)
AST: 17 U/L (ref 15–41)
Albumin: 3.2 g/dL — ABNORMAL LOW (ref 3.5–5.0)
Alkaline Phosphatase: 82 U/L (ref 38–126)
Anion gap: 8 (ref 5–15)
BUN: 14 mg/dL (ref 6–20)
CO2: 21 mmol/L — ABNORMAL LOW (ref 22–32)
Calcium: 9.1 mg/dL (ref 8.9–10.3)
Chloride: 113 mmol/L — ABNORMAL HIGH (ref 98–111)
Creatinine, Ser: 0.91 mg/dL (ref 0.44–1.00)
GFR calc Af Amer: >60 mL/min (ref >60)
GFR calc non Af Amer: >60 mL/min (ref >60)
Glucose, Bld: 130 mg/dL — ABNORMAL HIGH (ref 70–99)
Potassium: 3.7 mmol/L (ref 3.5–5.1)
Sodium: 142 mmol/L (ref 135–145)
Total Bilirubin: 0.3 mg/dL (ref 0.3–1.2)
Total Protein: 6.3 g/dL — ABNORMAL LOW (ref 6.5–8.1)

## 2020-01-02 LAB — CBC
HCT: 40.1 % (ref 36.0–46.0)
Hemoglobin: 12.8 g/dL (ref 12.0–15.0)
MCH: 28.4 pg (ref 26.0–34.0)
MCHC: 31.9 g/dL (ref 30.0–36.0)
MCV: 89.1 fL (ref 80.0–100.0)
Platelets: 176 10*3/uL (ref 150–400)
RBC: 4.5 MIL/uL (ref 3.87–5.11)
RDW: 13.2 % (ref 11.5–15.5)
WBC: 8.9 10*3/uL (ref 4.0–10.5)
nRBC: 0 % (ref 0.0–0.2)

## 2020-01-02 LAB — URINALYSIS, ROUTINE W REFLEX MICROSCOPIC
Bilirubin Urine: NEGATIVE
Glucose, UA: 50 mg/dL — AB
Ketones, ur: NEGATIVE mg/dL
Nitrite: POSITIVE — AB
Protein, ur: NEGATIVE mg/dL
Specific Gravity, Urine: 1.016 (ref 1.005–1.030)
WBC, UA: >50 WBC/hpf — ABNORMAL HIGH (ref 0–5)
pH: 5 (ref 5.0–8.0)

## 2020-01-02 LAB — PROTIME-INR
INR: 1.2 (ref 0.8–1.2)
Prothrombin Time: 14.4 seconds (ref 11.4–15.2)

## 2020-01-02 LAB — LACTIC ACID, PLASMA: Lactic Acid, Venous: 1.6 mmol/L (ref 0.5–1.9)

## 2020-01-02 LAB — TROPONIN I (HIGH SENSITIVITY): Troponin I (High Sensitivity): 18 ng/L — ABNORMAL HIGH (ref <18)

## 2020-01-02 LAB — PROCALCITONIN: Procalcitonin: 0.14 ng/mL

## 2020-01-02 LAB — TRIGLYCERIDES: Triglycerides: 103 mg/dL (ref <150)

## 2020-01-02 LAB — PHOSPHORUS: Phosphorus: 3.5 mg/dL (ref 2.5–4.6)

## 2020-01-02 LAB — GLUCOSE, CAPILLARY: Glucose-Capillary: 125 mg/dL — ABNORMAL HIGH (ref 70–99)

## 2020-01-02 LAB — MAGNESIUM: Magnesium: 1.8 mg/dL (ref 1.7–2.4)

## 2020-01-02 MED ORDER — ATORVASTATIN CALCIUM 40 MG PO TABS
40.0000 mg | ORAL_TABLET | Freq: Every day | ORAL | Status: DC
  Administered 2020-01-03 – 2020-01-08 (×6): 40 mg
  Filled 2020-01-02 (×6): qty 1

## 2020-01-02 MED ORDER — FOLIC ACID 1 MG PO TABS
1.0000 mg | ORAL_TABLET | Freq: Every day | ORAL | Status: DC
  Administered 2020-01-02 – 2020-01-08 (×7): 1 mg
  Filled 2020-01-02 (×7): qty 1

## 2020-01-02 MED ORDER — SODIUM CHLORIDE 0.9 % IV SOLN
1.0000 g | INTRAVENOUS | Status: AC
  Administered 2020-01-02 – 2020-01-08 (×7): 1 g via INTRAVENOUS
  Filled 2020-01-02: qty 10
  Filled 2020-01-02: qty 1
  Filled 2020-01-02 (×3): qty 10
  Filled 2020-01-02: qty 1
  Filled 2020-01-02 (×2): qty 10

## 2020-01-02 MED ORDER — ASPIRIN 325 MG PO TABS
325.0000 mg | ORAL_TABLET | Freq: Every day | ORAL | Status: DC
  Administered 2020-01-03 – 2020-01-08 (×6): 325 mg
  Filled 2020-01-02 (×6): qty 1

## 2020-01-02 MED ORDER — LORAZEPAM 2 MG/ML IJ SOLN
1.0000 mg | INTRAMUSCULAR | Status: AC | PRN
  Administered 2020-01-02 – 2020-01-04 (×2): 2 mg via INTRAVENOUS
  Filled 2020-01-02 (×2): qty 1

## 2020-01-02 MED ORDER — LORAZEPAM 1 MG PO TABS
1.0000 mg | ORAL_TABLET | ORAL | Status: AC | PRN
  Administered 2020-01-04: 1 mg via ORAL
  Filled 2020-01-02: qty 1

## 2020-01-02 MED ORDER — HYDRALAZINE HCL 20 MG/ML IJ SOLN
5.0000 mg | Freq: Four times a day (QID) | INTRAMUSCULAR | Status: DC | PRN
  Administered 2020-01-04 – 2020-01-07 (×3): 5 mg via INTRAVENOUS
  Filled 2020-01-02 (×3): qty 1

## 2020-01-02 MED ORDER — FREE WATER
50.0000 mL | Freq: Four times a day (QID) | Status: DC
  Administered 2020-01-02 – 2020-01-08 (×25): 50 mL

## 2020-01-02 MED ORDER — IPRATROPIUM-ALBUTEROL 0.5-2.5 (3) MG/3ML IN SOLN
3.0000 mL | Freq: Four times a day (QID) | RESPIRATORY_TRACT | Status: DC
  Administered 2020-01-02 – 2020-01-03 (×3): 3 mL via RESPIRATORY_TRACT
  Filled 2020-01-02 (×4): qty 3

## 2020-01-02 MED ORDER — THIAMINE HCL 100 MG PO TABS
100.0000 mg | ORAL_TABLET | Freq: Every day | ORAL | Status: DC
  Administered 2020-01-02 – 2020-01-08 (×7): 100 mg
  Filled 2020-01-02 (×7): qty 1

## 2020-01-02 MED ORDER — ALBUTEROL SULFATE (2.5 MG/3ML) 0.083% IN NEBU: 2.5000 mg | INHALATION_SOLUTION | RESPIRATORY_TRACT | Status: DC | PRN

## 2020-01-02 MED ORDER — MAGNESIUM SULFATE 2 GM/50ML IV SOLN
2.0000 g | Freq: Once | INTRAVENOUS | Status: AC
  Administered 2020-01-02: 2 g via INTRAVENOUS
  Filled 2020-01-02: qty 50

## 2020-01-02 MED ORDER — THIAMINE HCL 100 MG/ML IJ SOLN
100.0000 mg | Freq: Every day | INTRAMUSCULAR | Status: DC
  Filled 2020-01-02: qty 2

## 2020-01-02 NOTE — Progress Notes (Signed)
SLP Cancellation Note  Patient Details Name: Anne [name] MRN: [id number] DOB: [birth year]   Cancelled treatment:       Reason Eval/Treat Not Completed: Patient's level of consciousness. Remains lethargic, has a Cortrak. Will f/u 6/20 for PO trials as appropriate.    Carlito Bogert, Katherene Ponto 01/02/2020, 9:18 AM

## 2020-01-02 NOTE — Progress Notes (Addendum)
Anne [name], MRN:  [id number], DOB:  07/29/1960, LOS: 2 ADMISSION DATE:  12/31/2019, CONSULTATION DATE:  01/01/20 REFERRING MD:  Dr. Rory Percy, CHIEF COMPLAINT:  Respiratory distress   Brief History   Anne [name] is a 59 yo f with a PMHx of CAD, coronary vasospasm, HTN, polysubstance use (crack/cocaine) who presented with L hemiplegia and R-sided gaze deviation. 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 Underwent thrombectomy. She developed respiratory distress. CCM was consuted.  History of present illness   Anne [name] is a 59 yo f with a PMHx of CAD, coronary vasospasm, HTN, polysubstance use (crack/cocaine) who presented to the hospital via EMS as a code stroke. Per chart review, patient was last normal at 9:30am on 6/17. She was found on the floor of the bathroom at home with the tub full. She was brought to [facility] and CT scan showed R infarct. She was not given tpa due to being outside of time window. She then underwent thrombectomy by IR.   Daughter reports that pt had been endorsing headaches over the last 2 weeks and had had little improvement with aspirin. She is functional at home with her ADLs.  Past Medical History  CAD Coronary vasospasm HTN Polysubstance use (cocaine/crack)  Significant Hospital Events   6/17 - admitted. Underwent mechanical thrombectomy  Consults:  CCM  Procedures:  6/17 > Intubated and later extubated 6/17 > Mechanical thrombectomy with recanalization of right M1 TICI 3, angioplasty of R carotid  Significant Diagnostic Tests:  CT Head 6/17 > Acute nonhemorrhagic R MCA infarct. Chronic L basal ganglia infarct CTA head, CT with perfusion 6/17 > Occlusion of the R ICA at its origin in the neck and distal right M1 occlusion. Right MCA core infarct with penumbra. Mild left vertebral artery origin stenosis MRI/MRA brain 6/17 > Evolving moderate to large sized acute ischemic right  MCA infarct. No hemorrhage or mass effect. Underlying chronic bilateral basal ganglia lacunar infarcts. Interval revascularization of previously occluded right ICA and MCA s/p thrombectomy. Severe mid-right M1 stenosis.  CXR 6/18 > negative  Micro Data:  MRSA 6/17 > neg COVID 6/17 > neg  Antimicrobials:  n/a  Interim history/subjective:  Overnight febrile and increase WBC.   Objective   Blood pressure 139/85, pulse 87, temperature 98.5 F (36.9 C), temperature source Axillary, resp. rate 19, height 5\' 6"  (1.676 m), weight 91.4 kg, SpO2 100 %.        Intake/Output Summary (Last 24 hours) at 01/02/2020 0908 Last data filed at 01/02/2020 0800 Gross per 24 hour  Intake 2424.36 ml  Output 950 ml  Net 1474.36 ml   Filed Weights   12/31/19 1546 12/31/19 1845  Weight: 93.5 kg 91.4 kg    Examination: General: resting adult female in bed, nasal cannula in place HENT: normocephalic, atraumatic Lungs: Coarse breath sounds, no wheeze, mild rhonchi noted in upper, mild accessory muscle use  Cardiovascular: RRR, normal S1 and S2, no murmurs, rubs, gallops  Abdomen: soft, non-tender, active bowel sounds Extremities: -edema  Neuro: opens eyes to verbal and physical stimulation, follows commands to RLE, moves RUE, no movement noted to LUE/LLE GU: foley in place  Resolved Hospital Problem list     Assessment & Plan:  ASSESSMENT / PLAN:  Acute Respiratory Failure secondary to Question Aspiration Event H/O Asthma, Possible underlying COPD > Chronic Tobacco Use  -CXR with no active   P:   - Titrate  Supplemental Oxygen for Saturation Goal >92 (Currently on 6L HFNC)  - Continue Scheduled and PRN Nebs   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  -Carotid Duplex with Right ICA 80-99% Stenosis, Left 1-39 % P:   - Per Neurology  - PT/OT/ST   Essential HTN CAD Hypertriglyceridemia -ECHO 6/18 with EF 65/70. Grade 2 DD.  P: - BP goal  120-180. Off Cleviprex. PRN Hydralazine for Systolic > 253  - Continue ASA/Lipitor   Polysubstance Abuse  ETOH Withdrawal  Plan   -D/C Scheduled Ativan (increased Lethargy) -Place on CIWA protocol  -Add Folic Acid/Thiame/Multivitamin   Febrile, Leucocytosis Plan -U/A with Positive Nitrates, Few Bacteria > Started on Rocephin overnight. Continue to follow culture data.  Remaining management per Primary Team   Best practice:  Diet: NPO DVT prophylaxis: SCD GI prophylaxis: protonix Glucose control: SSI Mobility: bedridden Code Status: full Family Communication: Will updated family.  Disposition: ICU  Labs   CBC: Recent Labs  Lab 12/31/19 1510 12/31/19 1514 01/01/20 1010 01/01/20 1153 01/02/20 0626  WBC  --  8.1 12.3*  --  8.9  NEUTROABS  --  5.5  --   --   --   HGB 13.9 13.5 13.4 13.3 12.8  HCT 41.0 42.5 41.8 39.0 40.1  MCV  --  88.7 88.0  --  89.1  PLT  --  208 236  --  664    Basic Metabolic Panel: Recent Labs  Lab 12/31/19 1510 12/31/19 1514 01/01/20 1010 01/01/20 1153 01/02/20 0626  NA 142 140 141 143 142  K 3.6 3.7 3.6 3.4* 3.7  CL 110 110 112*  --  113*  CO2  --  21* 19*  --  21*  GLUCOSE 105* 108* 124*  --  130*  BUN 15 13 10   --  14  CREATININE 0.90 0.95 0.86  --  0.91  CALCIUM  --  9.8 9.2  --  9.1  MG  --   --   --   --  1.8  PHOS  --   --   --   --  3.5   GFR: Estimated Creatinine Clearance: 74.8 mL/min (by C-G formula based on SCr of 0.91 mg/dL). Recent Labs  Lab 12/31/19 1514 01/01/20 1010 01/02/20 0626  PROCALCITON  --   --  0.14  WBC 8.1 12.3* 8.9  LATICACIDVEN  --   --  1.6    Liver Function Tests: Recent Labs  Lab 12/31/19 1514 01/02/20 0626  AST 17 17  ALT 10 9  ALKPHOS 89 82  BILITOT 0.5 0.3  PROT 6.7 6.3*  ALBUMIN 4.1 3.2*   No results for input(s): LIPASE, AMYLASE in the last 168 hours. No results for input(s): AMMONIA in the last 168 hours.  ABG    Component Value Date/Time   PHART 7.391 01/01/2020 1153    PCO2ART 35.6 01/01/2020 1153   PO2ART 55 (L) 01/01/2020 1153   HCO3 21.6 01/01/2020 1153   TCO2 23 01/01/2020 1153   ACIDBASEDEF 3.0 (H) 01/01/2020 1153   O2SAT 88.0 01/01/2020 1153     Coagulation Profile: Recent Labs  Lab 12/31/19 1514 01/02/20 0626  INR 1.1 1.2    Cardiac Enzymes: No results for input(s): CKTOTAL, CKMB, CKMBINDEX, TROPONINI in the last 168 hours.  HbA1C: Hgb A1c MFr Bld  Date/Time Value Ref Range Status  01/01/2020 05:59 AM 5.5 4.8 - 5.6 % Final    Comment:    (NOTE)  Prediabetes: 5.7 - 6.4         Diabetes: >6.4         Glycemic control for adults with diabetes: <7.0   11/25/2014 01:19 AM 5.5 4.0 - 6.0 % Final    CBG: Recent Labs  Lab 12/31/19 1515 01/02/20 0346  GLUCAP 89 125*    Review of Systems:   As above  Past Medical History  She,  has a past medical history of Asthma, CAD (coronary artery disease), Coronary vasospasm ([place]), Hypertension, and Polysubstance abuse ([place]).   Surgical History    Past Surgical History:  Procedure Laterality Date  . CARDIAC CATHETERIZATION N/A 11/25/2014   Procedure: Left Heart Cath and Coronary Angiography;  Surgeon: Minna Merritts, MD;  Location: [place] [facility];  Service: Cardiovascular;  Laterality: N/A;  . IR PERCUTANEOUS ART THROMBECTOMY/INFUSION INTRACRANIAL INC DIAG ANGIO  12/31/2019      . RADIOLOGY WITH ANESTHESIA Left 12/31/2019   Procedure: RADIOLOGY WITH ANESTHESIA;  Surgeon: Radiologist, Medication, MD;  Location: [place];  Service: Radiology;  Laterality: Left;     Social History   reports that she has been smoking. She has a 45.00 pack-year smoking history. She has never used smokeless tobacco. She reports current alcohol use. She reports that she does not use drugs.   Family History   Her family history includes Breast cancer (age of onset: 32) in her mother; CAD (age of onset: 45) in her father.   Allergies No Known Allergies   Home Medications  Prior to Admission  medications   Medication Sig Start Date End Date Taking? Authorizing Provider  albuterol (PROVENTIL HFA;VENTOLIN HFA) 108 (90 BASE) MCG/ACT inhaler Inhale 2 puffs into the lungs every 6 (six) hours as needed for wheezing or shortness of breath. 11/25/14   Gladstone Lighter, MD  albuterol (PROVENTIL) (2.5 MG/3ML) 0.083% nebulizer solution Take 3 mLs (2.5 mg total) by nebulization every 6 (six) hours as needed for wheezing or shortness of breath. 11/25/14   Gladstone Lighter, MD  amLODipine (NORVASC) 5 MG tablet Take 1 tablet (5 mg total) by mouth daily. 11/03/16   Carrie Mew, MD  aspirin EC 81 MG EC tablet Take 1 tablet (81 mg total) by mouth daily. Patient not taking: Reported on 11/03/2016 11/25/14   Gladstone Lighter, MD  cetirizine (ZYRTEC) 10 MG tablet Take 10 mg by mouth daily as needed for allergies.     [provider]  diltiazem (CARDIZEM CD) 120 MG 24 hr capsule Take 1 capsule (120 mg total) by mouth daily. Patient not taking: Reported on 11/03/2016 11/25/14   Gladstone Lighter, MD  esomeprazole (NEXIUM) 20 MG capsule Take 20 mg by mouth daily at 12 noon.    [provider]  famotidine (PEPCID) 20 MG tablet Take 1 tablet (20 mg total) by mouth 2 (two) times daily. 11/03/16   Carrie Mew, MD  Fluticasone-Salmeterol (ADVAIR) 250-50 MCG/DOSE AEPB Inhale 1 puff into the lungs 2 (two) times daily. Patient not taking: Reported on 11/03/2016 11/25/14   Gladstone Lighter, MD  isosorbide mononitrate (IMDUR) 30 MG 24 hr tablet Take 1 tablet (30 mg total) by mouth daily. 11/03/16   Carrie Mew, MD  lisinopril (PRINIVIL,ZESTRIL) 10 MG tablet Take 10 mg by mouth daily.    [provider]  metoCLOPramide (REGLAN) 10 MG tablet Take 1 tablet (10 mg total) by mouth every 6 (six) hours as needed. 11/03/16   Carrie Mew, MD  nitroGLYCERIN (NITROSTAT) 0.4 MG SL tablet Place 1 tablet (0.4 mg  total) under the tongue every 5 (five) minutes as needed for chest  pain. Patient not taking: Reported on 11/03/2016 11/25/14   Gladstone Lighter, MD  ondansetron (ZOFRAN ODT) 4 MG disintegrating tablet Take 1 tablet (4 mg total) by mouth every 8 (eight) hours as needed for nausea or vomiting. 11/03/16   Carrie Mew, MD     Critical care time: [address], AGACNP-BC [facility]  Pgr: [phone]  PCCM Pgr: [phone]

## 2020-01-02 NOTE — Progress Notes (Signed)
STROKE TEAM PROGRESS NOTE   INTERVAL HISTORY It appears the patient spike fever 102 overnight.  She may have a UTI and was placed on Rocephin.  Fever has gotten better.  OBJECTIVE Vitals:   01/02/20 0807 01/02/20 0900 01/02/20 0907 01/02/20 1000  BP:   (!) 153/77 (!) 155/83  Pulse:  87 84 79  Resp:  19 18 18   Temp:      TempSrc:      SpO2: 100% 100% 100% 100%  Weight:      Height:        CBC:  Recent Labs  Lab 12/31/19 1514 12/31/19 1514 01/01/20 1010 01/01/20 1010 01/01/20 1153 01/02/20 0626  WBC 8.1   < > 12.3*  --   --  8.9  NEUTROABS 5.5  --   --   --   --   --   HGB 13.5   < > 13.4   < > 13.3 12.8  HCT 42.5   < > 41.8   < > 39.0 40.1  MCV 88.7   < > 88.0  --   --  89.1  PLT 208   < > 236  --   --  176   < > = values in this interval not displayed.    Basic Metabolic Panel:  Recent Labs  Lab 01/01/20 1010 01/01/20 1010 01/01/20 1153 01/02/20 0626  NA 141   < > 143 142  K 3.6   < > 3.4* 3.7  CL 112*  --   --  113*  CO2 19*  --   --  21*  GLUCOSE 124*  --   --  130*  BUN 10  --   --  14  CREATININE 0.86  --   --  0.91  CALCIUM 9.2  --   --  9.1  MG  --   --   --  1.8  PHOS  --   --   --  3.5   < > = values in this interval not displayed.    Lipid Panel:     Component Value Date/Time   CHOL 184 01/01/2020 0559   TRIG 103 01/02/2020 0626   HDL 38 (L) 01/01/2020 0559   CHOLHDL 4.8 01/01/2020 0559   VLDL 58 (H) 01/01/2020 0559   LDLCALC 88 01/01/2020 0559   HgbA1c:  Lab Results  Component Value Date   HGBA1C 5.5 01/01/2020   Urine Drug Screen:     Component Value Date/Time   LABOPIA NONE DETECTED 01/01/2020 1230   COCAINSCRNUR NONE DETECTED 01/01/2020 1230   LABBENZ NONE DETECTED 01/01/2020 1230   AMPHETMU NONE DETECTED 01/01/2020 1230   THCU NONE DETECTED 01/01/2020 1230   LABBARB NONE DETECTED 01/01/2020 1230    Alcohol Level No results found for: ETH  IMAGING  MR ANGIO HEAD WO CONTRAST 12/31/2019 IMPRESSION:   MRI HEAD   IMPRESSION:  1. Technically limited exam due to motion artifact.  2. Evolving moderate to large sized acute ischemic right MCA territory infarct as above. No associated hemorrhage or mass effect.  3. Underlying chronic bilateral basal ganglia lacunar infarcts, with additional small chronic left parietal infarct.   MRA HEAD  IMPRESSION:  1. Technically limited exam due to extensive motion artifact.  2. Interval revascularization of previously occluded right ICA and MCA status post catheter directed thrombectomy.  3. Underlying short-segment 3 mm severe mid right M1 stenosis.  4. Otherwise grossly stable intracranial MRA as compared to prior CTA.   CT  CEREBRAL PERFUSION W CONTRAST 12/31/2019 IMPRESSION:  1. 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  2. Distal right M1 occlusion.  3. 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  4. Mild left vertebral artery origin stenosis.  5. Aortic Atherosclerosis (ICD10-I70.0) and Emphysema (ICD10-J43.9).   CT HEAD CODE STROKE WO CONTRAST 12/31/2019 IMPRESSION:  1. Acute nonhemorrhagic right MCA infarct.  2. ASPECTS is 6.  3. Chronic left basal ganglia infarct.   CT ANGIO HEAD CODE STROKE CT ANGIO NECK CODE STROKE 12/31/2019 IMPRESSION:  1. 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  2. Distal right M1 occlusion.  3. 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  4. Mild left vertebral artery origin stenosis.  5. Aortic Atherosclerosis (ICD10-I70.0) and Emphysema (ICD10-J43.9).   DG Chest Port 1 View 12/31/2019 IMPRESSION:  Right mid lung atelectasis or scarring.  No active disease.   Neuro Interventional Radiology - Cerebral Angiogram with Intervention - Dr. Erven Colla de Sindy Messing 12/31/2019 Occluded right carotid bulb crossed with balloon guide catheter. Mechanical thrombectomy performed in the right MCA/M1 with combined stent retriever   (63mm solitaire) and aspiration with complete recanalization (TICI3). Embolus to right A3/ACA noted. Mechanical thrombectomy performed with a 3 mm stent retriever (solitaire) with complete recanalization with slow distal flow (TICI 2C). Slow flow noted in distal MCA branches w/o occlusion. Balloon guide catheter retracted to the level of the right common carotid artery. Angiogram showed densely calcified plaque with approximately 45-50% stenosis. Follow-up angiogram of the head showed brink opacification of both ACA and MCA vascular tree.  Transthoracic Echocardiogram  1. Left ventricular ejection fraction, by estimation, is 65 to 70%. The  left ventricle has normal function. The left ventricle has no regional  wall motion abnormalities. There is mild concentric left ventricular  hypertrophy. Left ventricular diastolic  parameters are consistent with Grade II diastolic dysfunction  (pseudonormalization). Elevated left ventricular end-diastolic pressure.  2. Right ventricular systolic function is normal. The right ventricular  size is normal. There is normal pulmonary artery systolic pressure.  3. Left atrial size was mildly dilated.  4. The mitral valve is normal in structure. No evidence of mitral valve  regurgitation. No evidence of mitral stenosis.  5. The aortic valve is normal in structure. Aortic valve regurgitation is  mild. No aortic stenosis is present.  6. The inferior vena cava is normal in size with greater than 50%  respiratory variability, suggesting right atrial pressure of 3 mmHg.   PHYSICAL EXAM   Temp:  [98.3 F (36.8 C)-100.5 F (38.1 C)] 98.5 F (36.9 C) (06/19 0800) Pulse Rate:  [79-127] 79 (06/19 1000) Resp:  [14-32] 18 (06/19 1000) BP: (103-175)/(58-129) 155/83 (06/19 1000) SpO2:  [90 %-100 %] 100 % (06/19 1000)  General - Well nourished, well developed, lethargic and drowsy  Ophthalmologic - fundi not visualized due to noncooperation.  Cardiovascular -  Regular rhythm and rate.  Neuro - lethargic and drowsy, but open eyes on pain. Orientated to place, self and age, but not to time. Severe dysarthria. Paucity of speech, not cooperative with naming or repeating, but following simple commands. Right gaze preference, not cross midline. Blinking to visual threat on the right consistently, but not consistent on the left. PERRL. Left facial droop. Tongue midline. RUE and RLE spontaenous movement and against gravity 3/5. However, LUE 3/5 with pain and LLE 1/5 withdraw with pain.  Sensation and coordination not cooperative and  gait not tested.     ASSESSMENT/PLAN [name] is a 59 y.o. female with history of polysubstance abuse-crack/cocaine, tobacco use, hypertension, coronary vasospasms, CAD and recent headaches  found on the bathroom floor with right gaze deviation and left-sided hemiplegia.  She did not receive IV t-PA due to late presentation (>4.5 hours from time of onset).  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 ASPECTS is 6. Chronic left basal ganglia infarct.   CTA H&N - Occlusion of the right ICA at its origin in the neck with intracranial reconstitution. Distal right M1 occlusion. Mild left vertebral artery origin stenosis.   CT Perfusion - Right MCA core infarct with penumbra  IR s/p thrombectomy with TICI 3 reperfusion and R ICA angioplasty   MRI head - Evolving moderate sized acute ischemic right MCA territory infarct as above. No associated hemorrhage or mass effect. Underlying chronic bilateral basal ganglia lacunar infarcts, with additional small chronic left parietal infarct.  MRA head - Interval revascularization of previously occluded right ICA and MCA status post catheter directed thrombectomy. Underlying short-segment 3 mm severe mid right M1 stenosis.   Carotid  Doppler - R ICA 80-99% stenosis  2D Echo EF 65 to 70%  [employer] Virus 2 - negative  LDL - 88  HgbA1c - 5.5  UDS neg  VTE prophylaxis - lovenox  aspirin 81 mg daily prior to admission, now on ASA 325 and plavix 75 DAPT due to severe right ICA stenosis.   Patient counseled to be compliant with her antithrombotic medications  Ongoing aggressive stroke risk factor management  Therapy recommendations:  pending  Disposition:  Pending  Right ICA stenosis  CTA head and neck showed right ICA occlusion at origin  Status post right ICA angioplasty  Carotid Doppler post procedure right ICA 80 to 99% stenosis  On DAPT  Will need to discuss with Dr. Katherina Right Rodriguze regarding right ICA stenting once pt stabilized from current stroke  Respiratory distress  Overnight desaturation  On 10 L nasal cannula  CCM on board  Aggressive suctioning and nebulization  Much improved  CXR x2 negative  Close monitoring  Hypertension  Home BP meds: Norvasc ; Cardizem ; Lisinopril ; Imdur  On Cleviprex  SBP mildly high at times . SBP 120 - 140 for 24 hours post procedure . Long-term BP goal normotensive  Hyperlipidemia  Home Lipid lowering medication: none   LDL 88, goal < 70  Current lipid lowering medication: add Lipitor 40 mg daily   Continue statin at discharge  Tobacco abuse  Current smoker  Smoking cessation counseling will be provided  Other Stroke Risk Factors  Advanced age  ETOH use, advised to drink no more than 1 alcoholic beverage per day.  Obesity, Body mass index is 32.52 kg/m., recommend weight loss, diet and exercise as appropriate   Coronary artery disease  Previous strokes by imaging  Other Active Problems  Code status - Full code  Aortic Atherosclerosis (ICD10-I70.0)   Emphysema (ICD10-J43.9).   Leukocytosis WBC 12.3->8.9 (temp - 100.5->98.5)  FEVER AND UTI She has been started on antibiotics.   Hospital day # 2  This  patient is critically ill due to stroke post procedure and at significant risk of neurological worsening, death form severe anemia, bleeding, recurrent stroke, intracranial stenosis. This patient's care requires constant monitoring of  vital signs, hemodynamics, respiratory and cardiac monitoring, review of multiple databases, neurological assessment, discussion with family, other specialists and medical decision making of high complexity. I spent71minutes of neurocritical care time in the care of this patient.    To contact Stroke Continuity provider, please refer to [email or web address] After hours, contact General Neurology

## 2020-01-03 LAB — GLUCOSE, CAPILLARY
Glucose-Capillary: 104 mg/dL — ABNORMAL HIGH (ref 70–99)
Glucose-Capillary: 119 mg/dL — ABNORMAL HIGH (ref 70–99)

## 2020-01-03 LAB — CBC
HCT: 39.1 % (ref 36.0–46.0)
Hemoglobin: 12.5 g/dL (ref 12.0–15.0)
MCH: 28.1 pg (ref 26.0–34.0)
MCHC: 32 g/dL (ref 30.0–36.0)
MCV: 87.9 fL (ref 80.0–100.0)
Platelets: 213 10*3/uL (ref 150–400)
RBC: 4.45 MIL/uL (ref 3.87–5.11)
RDW: 13.5 % (ref 11.5–15.5)
WBC: 9.7 10*3/uL (ref 4.0–10.5)
nRBC: 0 % (ref 0.0–0.2)

## 2020-01-03 LAB — BASIC METABOLIC PANEL
Anion gap: 8 (ref 5–15)
BUN: 16 mg/dL (ref 6–20)
CO2: 24 mmol/L (ref 22–32)
Calcium: 9.2 mg/dL (ref 8.9–10.3)
Chloride: 111 mmol/L (ref 98–111)
Creatinine, Ser: 0.77 mg/dL (ref 0.44–1.00)
GFR calc Af Amer: >60 mL/min (ref >60)
GFR calc non Af Amer: >60 mL/min (ref >60)
Glucose, Bld: 101 mg/dL — ABNORMAL HIGH (ref 70–99)
Potassium: 4.3 mmol/L (ref 3.5–5.1)
Sodium: 143 mmol/L (ref 135–145)

## 2020-01-03 LAB — PHOSPHORUS: Phosphorus: 3.1 mg/dL (ref 2.5–4.6)

## 2020-01-03 LAB — MAGNESIUM: Magnesium: 2.1 mg/dL (ref 1.7–2.4)

## 2020-01-03 LAB — PROCALCITONIN: Procalcitonin: <0.1 ng/mL

## 2020-01-03 MED ORDER — IPRATROPIUM-ALBUTEROL 0.5-2.5 (3) MG/3ML IN SOLN
3.0000 mL | Freq: Two times a day (BID) | RESPIRATORY_TRACT | Status: DC
  Administered 2020-01-05 – 2020-01-06 (×3): 3 mL via RESPIRATORY_TRACT
  Filled 2020-01-03 (×4): qty 3

## 2020-01-03 NOTE — Progress Notes (Signed)
STROKE TEAM PROGRESS NOTE   INTERVAL HISTORY She is doing fair.  OBJECTIVE Vitals:   01/03/20 1200 01/03/20 1300 01/03/20 1400 01/03/20 1500  BP: (!) 162/70 (!) 162/82 (!) 176/101 (!) 148/85  Pulse: 97 93 (!) 133 (!) 105  Resp: (!) 23 (!) 23 16 (!) 21  Temp: 99.1 F (37.3 C)     TempSrc: Oral     SpO2: 95% 97% 96% 96%  Weight:      Height:        CBC:  Recent Labs  Lab 12/31/19 1514 01/01/20 1010 01/02/20 0626 01/03/20 0346  WBC 8.1   < > 8.9 9.7  NEUTROABS 5.5  --   --   --   HGB 13.5   < > 12.8 12.5  HCT 42.5   < > 40.1 39.1  MCV 88.7   < > 89.1 87.9  PLT 208   < > 176 213   < > = values in this interval not displayed.    Basic Metabolic Panel:  Recent Labs  Lab 01/02/20 0626 01/03/20 0346  NA 142 143  K 3.7 4.3  CL 113* 111  CO2 21* 24  GLUCOSE 130* 101*  BUN 14 16  CREATININE 0.91 0.77  CALCIUM 9.1 9.2  MG 1.8 2.1  PHOS 3.5 3.1    Lipid Panel:     Component Value Date/Time   CHOL 184 01/01/2020 0559   TRIG 103 01/02/2020 0626   HDL 38 (L) 01/01/2020 0559   CHOLHDL 4.8 01/01/2020 0559   VLDL 58 (H) 01/01/2020 0559   LDLCALC 88 01/01/2020 0559   HgbA1c:  Lab Results  Component Value Date   HGBA1C 5.5 01/01/2020   Urine Drug Screen:     Component Value Date/Time   LABOPIA NONE DETECTED 01/01/2020 1230   COCAINSCRNUR NONE DETECTED 01/01/2020 1230   LABBENZ NONE DETECTED 01/01/2020 1230   AMPHETMU NONE DETECTED 01/01/2020 1230   THCU NONE DETECTED 01/01/2020 1230   LABBARB NONE DETECTED 01/01/2020 1230    Alcohol Level No results found for: ETH  IMAGING  MR ANGIO HEAD WO CONTRAST 12/31/2019 IMPRESSION:   MRI HEAD  IMPRESSION:  1. Technically limited exam due to motion artifact.  2. Evolving moderate to large sized acute ischemic right MCA territory infarct as above. No associated hemorrhage or mass effect.  3. Underlying chronic bilateral basal ganglia lacunar infarcts, with additional small chronic left parietal infarct.   MRA  HEAD  IMPRESSION:  1. Technically limited exam due to extensive motion artifact.  2. Interval revascularization of previously occluded right ICA and MCA status post catheter directed thrombectomy.  3. Underlying short-segment 3 mm severe mid right M1 stenosis.  4. Otherwise grossly stable intracranial MRA as compared to prior CTA.   CT CEREBRAL PERFUSION W CONTRAST 12/31/2019 IMPRESSION:  1. 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  2. Distal right M1 occlusion.  3. 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  4. Mild left vertebral artery origin stenosis.  5. Aortic Atherosclerosis (ICD10-I70.0) and Emphysema (ICD10-J43.9).   CT HEAD CODE STROKE WO CONTRAST 12/31/2019 IMPRESSION:  1. Acute nonhemorrhagic right MCA infarct.  2. ASPECTS is 6.  3. Chronic left basal ganglia infarct.   CT ANGIO HEAD CODE STROKE CT ANGIO NECK CODE STROKE 12/31/2019 IMPRESSION:  1. 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  2. Distal right M1 occlusion.  3. Right MCA core infarct  with penumbra as above.  4. Mild left vertebral artery origin stenosis.  5. Aortic Atherosclerosis (ICD10-I70.0) and Emphysema (ICD10-J43.9).   DG Chest Port 1 View 12/31/2019 IMPRESSION:  Right mid lung atelectasis or scarring.  No active disease.   Neuro Interventional Radiology - Cerebral Angiogram with Intervention - Dr. Erven Colla de Sindy Messing 12/31/2019 Occluded right carotid bulb crossed with balloon guide catheter. Mechanical thrombectomy performed in the right MCA/M1 with combined stent retriever  (110mm solitaire) and aspiration with complete recanalization (TICI3). Embolus to right A3/ACA noted. Mechanical thrombectomy performed with a 3 mm stent retriever (solitaire) with complete recanalization with slow distal flow (TICI 2C). Slow flow noted in distal MCA branches w/o occlusion. Balloon guide catheter retracted to the  level of the right common carotid artery. Angiogram showed densely calcified plaque with approximately 45-50% stenosis. Follow-up angiogram of the head showed brink opacification of both ACA and MCA vascular tree.  Transthoracic Echocardiogram  1. Left ventricular ejection fraction, by estimation, is 65 to 70%. The  left ventricle has normal function. The left ventricle has no regional  wall motion abnormalities. There is mild concentric left ventricular  hypertrophy. Left ventricular diastolic  parameters are consistent with Grade II diastolic dysfunction  (pseudonormalization). Elevated left ventricular end-diastolic pressure.  2. Right ventricular systolic function is normal. The right ventricular  size is normal. There is normal pulmonary artery systolic pressure.  3. Left atrial size was mildly dilated.  4. The mitral valve is normal in structure. No evidence of mitral valve  regurgitation. No evidence of mitral stenosis.  5. The aortic valve is normal in structure. Aortic valve regurgitation is  mild. No aortic stenosis is present.  6. The inferior vena cava is normal in size with greater than 50%  respiratory variability, suggesting right atrial pressure of 3 mmHg.   PHYSICAL EXAM   Temp:  [99.1 F (37.3 C)-100.7 F (38.2 C)] 99.1 F (37.3 C) (06/20 1200) Pulse Rate:  [79-136] 105 (06/20 1500) Resp:  [16-43] 21 (06/20 1500) BP: (127-181)/(63-111) 148/85 (06/20 1500) SpO2:  [95 %-100 %] 96 % (06/20 1500)  General - Well nourished, well developed, lethargic and drowsy  Ophthalmologic - fundi not visualized due to noncooperation.  Cardiovascular - Regular rhythm and rate.  Neuro - lethargic and drowsy, but open eyes on pain. Orientated to place, self and age, but not to time. Severe dysarthria. Paucity of speech, not cooperative with naming or repeating, but following simple commands. Right gaze preference, not cross midline. Blinking to visual threat on the right  consistently, but not consistent on the left. PERRL. Left facial droop. Tongue midline. RUE and RLE spontaenous movement and against gravity 3/5. However, LUE 3/5 with pain and LLE 1/5 withdraw with pain.  Sensation and coordination not cooperative and gait not tested.     ASSESSMENT/PLAN [name] is a 59 y.o. female with history of polysubstance abuse-crack/cocaine, tobacco use, hypertension, coronary vasospasms, CAD and recent headaches  found on the bathroom floor with right gaze deviation and left-sided hemiplegia.  She did not receive IV t-PA due to late presentation (>4.5 hours from time of onset).  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 ASPECTS is 6. Chronic left basal ganglia infarct.   CTA H&N - Occlusion of the right ICA at its origin in the neck with intracranial reconstitution. Distal right  M1 occlusion. Mild left vertebral artery origin stenosis.   CT Perfusion - Right MCA core infarct with penumbra  IR s/p thrombectomy with TICI 3 reperfusion and R ICA angioplasty   MRI head - Evolving moderate sized acute ischemic right MCA territory infarct as above. No associated hemorrhage or mass effect. Underlying chronic bilateral basal ganglia lacunar infarcts, with additional small chronic left parietal infarct.  MRA head - Interval revascularization of previously occluded right ICA and MCA status post catheter directed thrombectomy. Underlying short-segment 3 mm severe mid right M1 stenosis.   Carotid Doppler - R ICA 80-99% stenosis  2D Echo EF 65 to 70%  [employer] Virus 2 - negative  LDL - 88  HgbA1c - 5.5  UDS neg  VTE prophylaxis - lovenox  aspirin 81 mg daily prior to admission, now on ASA 325 and plavix 75 DAPT due to severe right ICA stenosis.   Patient counseled to be compliant with her antithrombotic  medications  Ongoing aggressive stroke risk factor management  Therapy recommendations:  pending  Disposition:  Pending  Right ICA stenosis  CTA head and neck showed right ICA occlusion at origin  Status post right ICA angioplasty  Carotid Doppler post procedure right ICA 80 to 99% stenosis  On DAPT  Will need to discuss with Dr. Katherina Right Rodriguze regarding right ICA stenting once pt stabilized from current stroke  Respiratory distress  Overnight desaturation  On 10 L nasal cannula  CCM on board  Aggressive suctioning and nebulization  Much improved  CXR x2 negative  Close monitoring  Hypertension  Home BP meds: Norvasc ; Cardizem ; Lisinopril ; Imdur  On Cleviprex  SBP mildly high at times . SBP 120 - 140 for 24 hours post procedure performed 6/17 (SBP 148 - 176 today) . Long-term BP goal normotensive  Hyperlipidemia  Home Lipid lowering medication: none   LDL 88, goal < 70  Current lipid lowering medication: add Lipitor 40 mg daily   Continue statin at discharge  Tobacco abuse  Current smoker  Smoking cessation counseling will be provided  Other Stroke Risk Factors  Advanced age  ETOH use, advised to drink no more than 1 alcoholic beverage per day.  Obesity, Body mass index is 32.52 kg/m., recommend weight loss, diet and exercise as appropriate   Coronary artery disease  Previous strokes by imaging  Other Active Problems  Code status - Full code  Aortic Atherosclerosis (ICD10-I70.0)   Emphysema (ICD10-J43.9).   Leukocytosis WBC 12.3->8.9->9.7 (temp - 100.5->98.5->99.1)  UTI - Rocephin started 6/19 x 7 days (UA c/w UTI but no culture performed) pt improving  Occasional tachycardia - monitor  Labs currently stable   Hospital day # 3  She will be transfered to the neuro stepdown.   To contact Stroke Continuity provider, please refer to [email or web address] After hours, contact General Neurology

## 2020-01-04 ENCOUNTER — Inpatient Hospital Stay (HOSPITAL_COMMUNITY): Payer: Medicaid Other

## 2020-01-04 DIAGNOSIS — I63231 Cerebral infarction due to unspecified occlusion or stenosis of right carotid arteries: Secondary | ICD-10-CM

## 2020-01-04 LAB — GLUCOSE, CAPILLARY
Glucose-Capillary: 127 mg/dL — ABNORMAL HIGH (ref 70–99)
Glucose-Capillary: 133 mg/dL — ABNORMAL HIGH (ref 70–99)
Glucose-Capillary: 121 mg/dL — ABNORMAL HIGH (ref 70–99)
Glucose-Capillary: 115 mg/dL — ABNORMAL HIGH (ref 70–99)

## 2020-01-04 LAB — MAGNESIUM: Magnesium: 2.2 mg/dL (ref 1.7–2.4)

## 2020-01-04 LAB — BASIC METABOLIC PANEL
Anion gap: 6 (ref 5–15)
BUN: 20 mg/dL (ref 6–20)
CO2: 25 mmol/L (ref 22–32)
Calcium: 9.8 mg/dL (ref 8.9–10.3)
Chloride: 114 mmol/L — ABNORMAL HIGH (ref 98–111)
Creatinine, Ser: 0.77 mg/dL (ref 0.44–1.00)
GFR calc Af Amer: >60 mL/min (ref >60)
GFR calc non Af Amer: >60 mL/min (ref >60)
Glucose, Bld: 118 mg/dL — ABNORMAL HIGH (ref 70–99)
Potassium: 4.2 mmol/L (ref 3.5–5.1)
Sodium: 145 mmol/L (ref 135–145)

## 2020-01-04 LAB — CBC
HCT: 40.1 % (ref 36.0–46.0)
Hemoglobin: 12.8 g/dL (ref 12.0–15.0)
MCH: 28.6 pg (ref 26.0–34.0)
MCHC: 31.9 g/dL (ref 30.0–36.0)
MCV: 89.7 fL (ref 80.0–100.0)
Platelets: 208 10*3/uL (ref 150–400)
RBC: 4.47 MIL/uL (ref 3.87–5.11)
RDW: 13.4 % (ref 11.5–15.5)
WBC: 10.1 10*3/uL (ref 4.0–10.5)
nRBC: 0 % (ref 0.0–0.2)

## 2020-01-04 LAB — PHOSPHORUS: Phosphorus: 4.2 mg/dL (ref 2.5–4.6)

## 2020-01-04 NOTE — Progress Notes (Signed)
NIR.  History of acute CVA s/p cerebral arteriogram with emergent mechanical thrombectomy of right MCA M1 occlusion achieving a TICI 3 revascularization, along with emergent mechanical thrombectomy of right ACA A3 occlusion achieving a TICI 2C via right femoral approach 12/31/2019 by Dr. Karenann Cai.  Plan to follow-up with Dr. Karenann Cai in clinic 1 month after discharge- order placed to facilitate this. Further plans per neurology- appreciate and agree with management. Please call [facility] with questions/concerns.   Bea Graff Tkeya Stencil, PA-C 01/04/2020, 10:30 AM

## 2020-01-04 NOTE — Progress Notes (Signed)
STROKE TEAM PROGRESS NOTE   INTERVAL HISTORY Patient is lying in bed.  She is sleepy but can be aroused but follows only simple midline commands.  She has a UTI and is being treated with ceftriaxone.  Vital signs are stable.  She is afebrile.  BMP this morning is fairly unremarkable CBC is also normal.  OBJECTIVE Vitals:   01/04/20 0822 01/04/20 0856 01/04/20 1205 01/04/20 1206  BP: (!) 189/86  (!) 157/83 (!) 157/83  Pulse: (!) 104  94 91  Resp: 17  17 18   Temp: 98.6 F (37 C)  98.8 F (37.1 C) 98.8 F (37.1 C)  TempSrc: Oral  Oral Oral  SpO2: 98% 98% 97% 100%  Weight:      Height:        CBC:  Recent Labs  Lab 12/31/19 1514 01/01/20 1010 01/03/20 0346 01/04/20 0444  WBC 8.1   < > 9.7 10.1  NEUTROABS 5.5  --   --   --   HGB 13.5   < > 12.5 12.8  HCT 42.5   < > 39.1 40.1  MCV 88.7   < > 87.9 89.7  PLT 208   < > 213 208   < > = values in this interval not displayed.    Basic Metabolic Panel:  Recent Labs  Lab 01/03/20 0346 01/04/20 0444  NA 143 145  K 4.3 4.2  CL 111 114*  CO2 24 25  GLUCOSE 101* 118*  BUN 16 20  CREATININE 0.77 0.77  CALCIUM 9.2 9.8  MG 2.1 2.2  PHOS 3.1 4.2    Lipid Panel:     Component Value Date/Time   CHOL 184 01/01/2020 0559   TRIG 103 01/02/2020 0626   HDL 38 (L) 01/01/2020 0559   CHOLHDL 4.8 01/01/2020 0559   VLDL 58 (H) 01/01/2020 0559   LDLCALC 88 01/01/2020 0559   HgbA1c:  Lab Results  Component Value Date   HGBA1C 5.5 01/01/2020   Urine Drug Screen:     Component Value Date/Time   LABOPIA NONE DETECTED 01/01/2020 1230   COCAINSCRNUR NONE DETECTED 01/01/2020 1230   LABBENZ NONE DETECTED 01/01/2020 1230   AMPHETMU NONE DETECTED 01/01/2020 1230   THCU NONE DETECTED 01/01/2020 1230   LABBARB NONE DETECTED 01/01/2020 1230    Alcohol Level No results found for: ETH  IMAGING past 24h DG Chest Port 1 View  Result Date: 01/04/2020 CLINICAL DATA:  Hypoxia EXAM: PORTABLE CHEST 1 VIEW COMPARISON:  01/01/2020 FINDINGS:  Feeding catheter is now noted extending into the stomach. The lungs are well aerated bilaterally. No focal infiltrate or sizable effusion is seen. Cardiac shadow is stable. No bony abnormality is noted. IMPRESSION: Feeding catheter extending into the stomach. No acute abnormality noted. Electronically Signed   By: Inez Catalina M.D.   On: 01/04/2020 08:32     PHYSICAL EXAM     General - Well nourished, well developed, middle-aged African-American lady lethargic and drowsy  Ophthalmologic - fundi not visualized due to noncooperation.  Cardiovascular - Regular rhythm and rate.  Neuro - lethargic and drowsy, but open eyes on pain. Oriented to place, self and age, but not to time. Severe dysarthria. Paucity of speech, not cooperative with naming or repeating, but following simple commands. Right gaze preference, not cross midline. Blinking to visual threat on the right consistently, but not consistent on the left. PERRL. Left facial droop. Tongue midline. RUE and RLE spontaenous movement and against gravity 3/5. However, LUE 3/5 with pain and  LLE 1/5 withdraw with pain.  Sensation and coordination not cooperative and gait not tested.    ASSESSMENT/PLAN Ms. Courtlyn Aki is a 59 y.o. female with history of polysubstance abuse-crack/cocaine, tobacco use, hypertension, coronary vasospasms, CAD and recent headaches  found on the bathroom floor with right gaze deviation and left-sided hemiplegia.  She did not receive IV t-PA due to late presentation (>4.5 hours from time of onset).  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 ASPECTS is 6. Chronic left basal ganglia infarct.   CTA H&N - Occlusion of the right ICA at its origin in the neck with intracranial reconstitution. Distal right M1 occlusion. Mild left vertebral artery origin stenosis.   CT  Perfusion - Right MCA core infarct with penumbra  IR s/p thrombectomy with TICI 3 reperfusion and R ICA angioplasty   MRI head - Evolving moderate sized acute ischemic right MCA territory infarct as above. No associated hemorrhage or mass effect. Underlying chronic bilateral basal ganglia lacunar infarcts, with additional small chronic left parietal infarct.  MRA head - Interval revascularization of previously occluded right ICA and MCA status post catheter directed thrombectomy. Underlying short-segment 3 mm severe mid right M1 stenosis.   Carotid Doppler - R ICA 80-99% stenosis  2D Echo EF 65 to 70%  [employer] Virus 2 - negative  LDL - 88  HgbA1c - 5.5  UDS neg  VTE prophylaxis - lovenox  aspirin 81 mg daily prior to admission, now on ASA 325 and plavix 75 DAPT due to severe right ICA stenosis.   Therapy recommendations:  CLR  Disposition:  Pending  Right ICA stenosis  CTA head and neck showed right ICA occlusion at origin  Status post right ICA angioplasty  Carotid Doppler post procedure right ICA 80 to 99% stenosis  On DAPT  Will need to discuss with Dr. Katherina Right Rodriguze regarding right ICA stenting once pt stabilized from current stroke  Respiratory distress  Overnight desaturation  On 10 L nasal cannula  CCM on board  Aggressive suctioning and nebulization  Much improved  CXR x2 negative  Close monitoring  Hypertension  Home BP meds: Norvasc ; Cardizem ; Lisinopril ; Imdur . Long-term BP goal normotensive  Hyperlipidemia  Home Lipid lowering medication: none   LDL 88, goal < 70  Current lipid lowering medication:  Lipitor 40 mg daily   Continue statin at discharge  Tobacco abuse  Current smoker  Smoking cessation counseling will be provided  Other Stroke Risk Factors  Advanced age  ETOH use, advised to drink no more than 1 alcoholic beverage per day.  Obesity, Body mass index is 32.52 kg/m., recommend weight loss, diet and  exercise as appropriate   Coronary artery disease  Previous strokes by imaging  Other Active Problems  Aortic Atherosclerosis   Emphysema   Leukocytosis WBC 10.1  (temp - 98.6)  UTI - Rocephin started 6/19 x 7 days (UA c/w UTI but no culture performed) pt improving  Occasional tachycardia - monitor  Hospital day # 4 Patient mental status has declined the last few days possibly from having a UTI.  She has been started on antibiotics.  We will continue to follow for next few days and use tube feeds.  Hopefully she will improve over the next few days to have a swallow eval.  And go for  rehab.  Greater than 50% time during this 25-minute visit was spent in counseling and coordination of care and discussion with care team. Antony Contras, MD To contact Stroke Continuity provider, please refer to [email or web address] After hours, contact General Neurology

## 2020-01-04 NOTE — Evaluation (Signed)
PCCM consulted 6/18 following aspiration event and respiratory distress. Patient now on room air and respiratory status is stable. PCCM to sign off at this time. If needed please re-consult.

## 2020-01-04 NOTE — Progress Notes (Signed)
  Speech Language Pathology Treatment: Dysphagia  Patient Details Name: Anne [name] MRN: [id number] DOB: 29-Feb-1961 Today's Date: 01/04/2020 Time: 5859-2924 SLP Time Calculation (min) (ACUTE ONLY): 9 min  Assessment / Plan / Recommendation Clinical Impression  Pt was seen for dysphagia treatment. She continues to exhibit difficulty maintaining alertness for extended periods of time. Verbal output was limited during the session and speech intelligibility reduced. She exhibited throat clearing with limited small boluses of ice chips and required cues for bolus manipulation. Pt still does not present as a candidate for safe oral intake. SLP will continue to follow pt.    HPI HPI: Pt is a 59 yo female presenting with L hemiplegia and R gaze preference. CT showed R MCA infarct. CTA revealed occlusion of R ICA. Pt is s/p thrombectomy on 6/17. PMH includes polysubstance abuse, HTN, coronary vasospasms, CAD, asthma.      SLP Plan  Continue with current plan of care       Recommendations  Diet recommendations: NPO Medication Administration: Via alternative means                Oral Care Recommendations: Oral care QID;Staff/trained caregiver to provide oral care Follow up Recommendations: Inpatient Rehab SLP Visit Diagnosis: Dysarthria and anarthria (R47.1);Cognitive communication deficit (R41.841) Plan: Continue with current plan of care       Roman Dubuc I. Hardin Negus, [place], [place] Office number [phone] Pager [place] 01/04/2020, 5:32 PM

## 2020-01-05 LAB — CBC
HCT: 41.1 % (ref 36.0–46.0)
Hemoglobin: 12.9 g/dL (ref 12.0–15.0)
MCH: 27.7 pg (ref 26.0–34.0)
MCHC: 31.4 g/dL (ref 30.0–36.0)
MCV: 88.2 fL (ref 80.0–100.0)
Platelets: 257 10*3/uL (ref 150–400)
RBC: 4.66 MIL/uL (ref 3.87–5.11)
RDW: 13.3 % (ref 11.5–15.5)
WBC: 9.5 10*3/uL (ref 4.0–10.5)
nRBC: 0 % (ref 0.0–0.2)

## 2020-01-05 LAB — PHOSPHORUS: Phosphorus: 4.1 mg/dL (ref 2.5–4.6)

## 2020-01-05 NOTE — Progress Notes (Signed)
Occupational Therapy Treatment Patient Details Name: Anne [name] MRN: [id number] DOB: 02-Oct-1960 Today's Date: 01/05/2020    History of present illness 59 y.o. female with history of polysubstance abuse-crack/cocaine, hypertension, coronary vasospasms and CAD.  Patient was brought to hospital as code stroke. Patient was found in the bathroom with the tub full and on the floor.  Patient was immediately brought to [facility] as code stroke from [place].  On arrival patient had a right gaze deviation and left-sided hemiplegia.  Initial CT head did reveal progressing stroke.  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  Patient was immediately brought back to IR for thrombectomy on 6/17.   OT comments  Pt seen in conjunction with PT to maximize participation and activity tolerance with pt able to progress OOB to chair this session with MAX A +2 via stand pivot to pts R side. Pt continue to present with heavy R gaze preference, flaccid RUE, impaired balance , and decreased activity tolerance impacting pts ability to engage in BADLs. Continue to recommend CIR, will follow.   Follow Up Recommendations  CIR    Equipment Recommendations  None recommended by OT    Recommendations for Other Services      Precautions / Restrictions Precautions Precautions: Fall Precaution Comments: R gaze preference Restrictions Weight Bearing Restrictions: No       Mobility Bed Mobility Overal bed mobility: Needs Assistance Bed Mobility: Supine to Sit     Supine to sit: Max assist;+2 for physical assistance     General bed mobility comments: pt able to initiate advancing RLE to EOB but required MAX A +2 to advance LLE to EOB and elevate trunk with use of bed pad  Transfers Overall transfer level: Needs assistance Equipment used: 2 person hand held assist Transfers: Sit to/from  [facility] Sit to Stand: Max assist;+2 physical assistance Stand pivot transfers: Max assist;+2 physical assistance       General transfer comment: MAX A +2 to stand pivot to recliner to pts R side. Pt required MAX A to power up into standing and initiate pivotal steps to recliner    Balance Overall balance assessment: Needs assistance Sitting-balance support: Single extremity supported;Feet supported Sitting balance-Leahy Scale: Poor Sitting balance - Comments: at least MOD A for sitting balance EOB     Standing balance-Leahy Scale: Zero                             ADL either performed or assessed with clinical judgement   ADL Overall ADL's : Needs assistance/impaired     Grooming: Wash/dry hands;Sitting;Minimal assistance Grooming Details (indicate cue type and reason): MIN A for cleanliness to wash face from recliner                 Toilet Transfer: Maximal assistance;+2 for safety/equipment;Stand-pivot Toilet Transfer Details (indicate cue type and reason): simulated via stand pivot to recliner wth MAX A +2 to pts R side         Functional mobility during ADLs: Maximal assistance;+2 for physical assistance (stand pivot only) General ADL Comments: pt able to complete UB ADLs with MIN A this session; MAX A +2 for stand pivot to recliner. continues to present with R gaze preference and flaccid LUE     Vision Patient Visual Report: Other (comment) (R gaze preference)     Perception  Praxis      Cognition Arousal/Alertness: Lethargic Behavior During Therapy: Flat affect Overall Cognitive Status: Difficult to assess                                 General Comments: pt flat overall during session but responding appropriately to commands with increased time. Pt noted to become tearful during session.        Exercises Other Exercises Other Exercises: PROM to pts LUE elbow flexion/ extension, wrist flexion/  extension Other Exercises: cervical rotation R<>L   Shoulder Instructions       General Comments pt with r gaze preference needing MAX cues and positioning of neck at midline to track to midline. Pt with HR increase to 120 bpm during session    Pertinent Vitals/ Pain       Pain Assessment: Faces Faces Pain Scale: No hurt  Home Living                                          Prior Functioning/Environment              Frequency  Min 2X/week        Progress Toward Goals  OT Goals(current goals can now be found in the care plan section)  Progress towards OT goals: Progressing toward goals  Acute Rehab OT Goals Patient Stated Goal: Daughter hopes pt will improve her ability to take care of self  OT Goal Formulation: With patient/family Time For Goal Achievement: 01/15/20 Potential to Achieve Goals: Good  Plan Discharge plan remains appropriate;Frequency remains appropriate    Co-evaluation    PT/OT/SLP Co-Evaluation/Treatment: Yes Reason for Co-Treatment: Complexity of the patient's impairments (multi-system involvement);For patient/therapist safety;To address functional/ADL transfers;Necessary to address cognition/behavior during functional activity   OT goals addressed during session: ADL's and self-care      AM-PAC OT "6 Clicks" Daily Activity     Outcome Measure   Help from another person eating meals?: A Lot Help from another person taking care of personal grooming?: A Little Help from another person toileting, which includes using toliet, bedpan, or urinal?: Total Help from another person bathing (including washing, rinsing, drying)?: Total Help from another person to put on and taking off regular upper body clothing?: Total Help from another person to put on and taking off regular lower body clothing?: Total 6 Click Score: 9    End of Session Equipment Utilized During Treatment: Gait belt  OT Visit Diagnosis: Hemiplegia and  hemiparesis Hemiplegia - Right/Left: Left Hemiplegia - dominant/non-dominant: Non-Dominant Hemiplegia - caused by: Cerebral infarction   Activity Tolerance Patient tolerated treatment well   Patient Left in chair;with call bell/phone within reach;with chair alarm set   Nurse Communication Mobility status;Need for lift equipment        Time: 6568-1275 OT Time Calculation (min): 33 min  Charges: OT General Charges $OT Visit: 1 Visit OT Treatments $Self Care/Home Management : 8-22 mins  Lanier Clam., COTA/L Acute Rehabilitation Services [phone] [place] 01/05/2020, 5:10 PM

## 2020-01-05 NOTE — TOC Initial Note (Signed)
Transition of Care [facility]) - Initial/Assessment Note    Patient Details  Name: Anne [name] MRN: [id number] Date of Birth: 12-Jan-1961  [facility]) CM/SW Contact:    Pollie Friar, RN Phone Number: 01/05/2020, 2:58 PM  Clinical Narrative:                 CM received a phone call from patients daughter asking to have her mother transferred to another hospital. She is interested in either [place] or [facility]. CM inquired if she had a physician at either facility that would accept her mother and currently she does not. She is going to make some phone calls and see if she can find an accepting physician. Daughter wants a second opinion for her mother. NP updated. TOC following.  Expected Discharge Plan: IP Rehab Facility Barriers to Discharge: Continued Medical Work up   Patient Goals and CMS Choice        Expected Discharge Plan and Services Expected Discharge Plan: [place] In-house Referral: Clinical Social Work Discharge Planning Services: CM Consult                                          Prior Living Arrangements/Services   Lives with:: Relatives                   Activities of Daily Living      Permission Sought/Granted                  Emotional Assessment              Admission diagnosis:  Stroke [facility]) [I63.9] Stroke (cerebrum) ([place]) [I63.9] Acute ischemic stroke ([place]) [I63.9] Cerebrovascular accident (CVA) due to embolism of right middle cerebral artery ([place]) [I63.411] Cerebrovascular accident (CVA) due to thrombosis of right middle cerebral artery ([place]) [I63.311] Patient Active Problem List   Diagnosis Date Noted  . Acute respiratory failure with hypoxia ([place])   . Stroke (cerebrum) ([place]) 12/31/2019  . CAD (coronary artery disease)   . Polysubstance abuse ([place])   . Hypertension   . Asthma   . Coronary vasospasm ([place])   . NSTEMI (non-ST elevated myocardial infarction) ([place]) 11/25/2014  . Chest pain 11/24/2014    PCP:  Donnie Coffin, MD Pharmacy:   [place], [place] - [address] [place] [address] Phone: [phone] Fax: [phone]     Social Determinants of Health (SDOH) Interventions    Readmission Risk Interventions No flowsheet data found.

## 2020-01-05 NOTE — Progress Notes (Signed)
STROKE TEAM PROGRESS NOTE   INTERVAL HISTORY Patient is lying in bed.  She remains  sleepy but can be aroused but follows only simple midline commands.   .  Vital signs are stable.  She is afebrile.  Speech therapy at recommends n.p.o. status.  Chest x-ray yesterday showed no acute abnormality.  CBC today is normal OBJECTIVE Vitals:   01/05/20 1123 01/05/20 1232 01/05/20 1325 01/05/20 1326  BP:  (!) 132/97    Pulse:  (!) 104    Resp: (!) 24 (!) 24 18 19   Temp:  99.1 F (37.3 C)    TempSrc:  Oral    SpO2: 93% 100%    Weight:      Height:        CBC:  Recent Labs  Lab 12/31/19 1514 01/01/20 1010 01/04/20 0444 01/05/20 0612  WBC 8.1   < > 10.1 9.5  NEUTROABS 5.5  --   --   --   HGB 13.5   < > 12.8 12.9  HCT 42.5   < > 40.1 41.1  MCV 88.7   < > 89.7 88.2  PLT 208   < > 208 257   < > = values in this interval not displayed.    Basic Metabolic Panel:  Recent Labs  Lab 01/03/20 0346 01/03/20 0346 01/04/20 0444 01/05/20 0612  NA 143  --  145  --   K 4.3  --  4.2  --   CL 111  --  114*  --   CO2 24  --  25  --   GLUCOSE 101*  --  118*  --   BUN 16  --  20  --   CREATININE 0.77  --  0.77  --   CALCIUM 9.2  --  9.8  --   MG 2.1  --  2.2  --   PHOS 3.1   < > 4.2 4.1   < > = values in this interval not displayed.    Lipid Panel:     Component Value Date/Time   CHOL 184 01/01/2020 0559   TRIG 103 01/02/2020 0626   HDL 38 (L) 01/01/2020 0559   CHOLHDL 4.8 01/01/2020 0559   VLDL 58 (H) 01/01/2020 0559   LDLCALC 88 01/01/2020 0559   HgbA1c:  Lab Results  Component Value Date   HGBA1C 5.5 01/01/2020   Urine Drug Screen:     Component Value Date/Time   LABOPIA NONE DETECTED 01/01/2020 1230   COCAINSCRNUR NONE DETECTED 01/01/2020 1230   LABBENZ NONE DETECTED 01/01/2020 1230   AMPHETMU NONE DETECTED 01/01/2020 1230   THCU NONE DETECTED 01/01/2020 1230   LABBARB NONE DETECTED 01/01/2020 1230    Alcohol Level No results found for: ETH  IMAGING past 24h No  results found.   PHYSICAL EXAM     General - Well nourished, well developed, middle-aged African-American lady lethargic and drowsy  Ophthalmologic - fundi not visualized due to noncooperation.  Cardiovascular - Regular rhythm and rate.  Neuro - lethargic and drowsy, but open eyes on pain. Oriented to place, self and age, but not to time. Severe dysarthria. Paucity of speech, not cooperative with naming or repeating, but following simple commands. Right gaze preference, not cross midline. Blinking to visual threat on the right consistently, but not consistent on the left. PERRL. Left facial droop. Tongue midline. RUE and RLE spontaenous movement and against gravity 3/5. However, LUE 3/5 with pain and LLE 1/5 withdraw with pain.  Sensation and coordination  not cooperative and gait not tested.    ASSESSMENT/PLAN [name] is a 59 y.o. female with history of polysubstance abuse-crack/cocaine, tobacco use, hypertension, coronary vasospasms, CAD and recent headaches  found on the bathroom floor with right gaze deviation and left-sided hemiplegia.  She did not receive IV t-PA due to late presentation (>4.5 hours from time of onset).  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 ASPECTS is 6. Chronic left basal ganglia infarct.   CTA H&N - Occlusion of the right ICA at its origin in the neck with intracranial reconstitution. Distal right M1 occlusion. Mild left vertebral artery origin stenosis.   CT Perfusion - Right MCA core infarct with penumbra  IR s/p thrombectomy with TICI 3 reperfusion and R ICA angioplasty   MRI head - Evolving moderate sized acute ischemic right MCA territory infarct as above. No associated hemorrhage or mass effect. Underlying chronic bilateral basal ganglia lacunar infarcts, with additional small chronic left  parietal infarct.  MRA head - Interval revascularization of previously occluded right ICA and MCA status post catheter directed thrombectomy. Underlying short-segment 3 mm severe mid right M1 stenosis.   Carotid Doppler - R ICA 80-99% stenosis  2D Echo EF 65 to 70%  [employer] Virus 2 - negative  LDL - 88  HgbA1c - 5.5  UDS neg  VTE prophylaxis - lovenox  aspirin 81 mg daily prior to admission, now on ASA 325 and plavix 75 DAPT due to severe right ICA stenosis.   Therapy recommendations:  CLR  Disposition:  Pending  Right ICA stenosis  CTA head and neck showed right ICA occlusion at origin  Status post right ICA angioplasty  Carotid Doppler post procedure right ICA 80 to 99% stenosis  On DAPT  Will need to discuss with Dr. Katherina Right Rodriguze regarding right ICA stenting once pt stabilized from current stroke  Respiratory distress  Overnight desaturation  On 10 L nasal cannula  CCM on board  Aggressive suctioning and nebulization  Much improved  CXR x2 negative  Close monitoring  Hypertension  Home BP meds: Norvasc ; Cardizem ; Lisinopril ; Imdur . Long-term BP goal normotensive  Hyperlipidemia  Home Lipid lowering medication: none   LDL 88, goal < 70  Current lipid lowering medication:  Lipitor 40 mg daily   Continue statin at discharge  Tobacco abuse  Current smoker  Smoking cessation counseling will be provided  Other Stroke Risk Factors  Advanced age  ETOH use, advised to drink no more than 1 alcoholic beverage per day.  Obesity, Body mass index is 32.52 kg/m., recommend weight loss, diet and exercise as appropriate   Coronary artery disease  Previous strokes by imaging  Other Active Problems  Aortic Atherosclerosis   Emphysema   Leukocytosis WBC 10.1  (temp - 98.6)  UTI - Rocephin started 6/19 x 7 days (UA c/w UTI but no culture performed) pt improving  Occasional tachycardia - monitor  Hospital day # 5 Patient  mental status has declined the last few days possibly from having a UTI.  She has been started on antibiotics.  We will continue to follow for next few days and use tube feeds.  Hopefully she will improve over the next few days to have a swallow eval.  And go for rehab.  I spoke to her daughter over the  phone regarding her lack of improvement and possibly considering rehab in a skilled nursing facility but she is reluctant and would like to give her more time and discuss this with her family members.  She wants to hold off on decision of placing a PEG tube for a few days greater than 50% time during this 25-minute visit was spent in counseling and coordination of care and discussion with care team. Antony Contras, MD To contact Stroke Continuity provider, please refer to [email or web address] After hours, contact General Neurology

## 2020-01-05 NOTE — Progress Notes (Signed)
Physical Therapy Treatment Patient Details Name: Anne [name] MRN: [id number] DOB: 12-05-1960 Today's Date: 01/05/2020    History of Present Illness 59 y.o. female with history of polysubstance abuse-crack/cocaine, hypertension, coronary vasospasms and CAD.  Patient was brought to hospital as code stroke. Patient was found in the bathroom with the tub full and on the floor.  Patient was immediately brought to [facility] as code stroke from [place].  On arrival patient had a right gaze deviation and left-sided hemiplegia.  Initial CT head did reveal progressing stroke.  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  Patient was immediately brought back to IR for thrombectomy on 6/17.    PT Comments    Pt was a bit lethargic on arrival.  She perked up when asked if she wanted to sit up.  Pt did not sustain focus on therapies and had a significant gaze preference with little to no attention to the L with inability to track past midline.  Emphasis on warm up ROM, transition to EOB, work on sitting balance and midline alignment with transfer to chair.    Follow Up Recommendations  CIR     Equipment Recommendations  Wheelchair (measurements PT);Wheelchair cushion (measurements PT);Hospital bed    Recommendations for Other Services       Precautions / Restrictions Precautions Precautions: Fall Precaution Comments: R gaze preference Restrictions Weight Bearing Restrictions: No    Mobility  Bed Mobility Overal bed mobility: Needs Assistance Bed Mobility: Supine to Sit     Supine to sit: Max assist;+2 for physical assistance     General bed mobility comments: pt able to initiate advancing RLE to EOB but required MAX A +2 to advance LLE to EOB and elevate trunk with use of bed pad.  Transfers Overall transfer level: Needs assistance Equipment used: 2 person hand  held assist Transfers: Sit to/from [facility] Sit to Stand: Max assist;+2 physical assistance Stand pivot transfers: Max assist;+2 physical assistance       General transfer comment: MAX A +2 to stand pivot to recliner to pts R side. Pt required MAX A to power up into standing and initiate pivotal steps to recliner  Ambulation/Gait             General Gait Details: not able   Stairs             Wheelchair Mobility    Modified Rankin (Stroke Patients Only) Modified Rankin (Stroke Patients Only) Pre-Morbid Rankin Score: No symptoms Modified Rankin: Severe disability     Balance Overall balance assessment: Needs assistance Sitting-balance support: Single extremity supported;Feet supported Sitting balance-Leahy Scale: Poor Sitting balance - Comments: at least MOD A for sitting balance EOB.  Worked about 5 min on coming right into midline, but then rotating and laterally moving neck right with visual feedback.     Standing balance-Leahy Scale: Zero Standing balance comment: face to face assist to attain standing and then supporting L knee                            Cognition Arousal/Alertness: Lethargic Behavior During Therapy: Flat affect (tearful) Overall Cognitive Status: Difficult to assess                                 General Comments: pt  flat overall during session but responding appropriately to commands with increased time. Pt noted to become tearful during session.      Exercises Other Exercises Other Exercises: PROM to pts LUE elbow flexion/ extension, wrist flexion/ extension Other Exercises: cervical rotation R<>L Other Exercises: PROM to bil LE's    General Comments General comments (skin integrity, edema, etc.): pt with r gaze preference needing MAX cues and positioning of neck at midline to track to midline. Pt with HR increase to 120 bpm during session      Pertinent Vitals/Pain Pain Assessment:  Faces Faces Pain Scale: No hurt Pain Intervention(s): Monitored during session    Home Living                      Prior Function            PT Goals (current goals can now be found in the care plan section) Acute Rehab PT Goals Patient Stated Goal: Daughter hopes pt will improve her ability to take care of self  PT Goal Formulation: With family Time For Goal Achievement: 01/15/20 Potential to Achieve Goals: Fair Progress towards PT goals: Progressing toward goals    Frequency    Min 4X/week      PT Plan Current plan remains appropriate    Co-evaluation PT/OT/SLP Co-Evaluation/Treatment: Yes Reason for Co-Treatment: Complexity of the patient's impairments (multi-system involvement);For patient/therapist safety;To address functional/ADL transfers;Necessary to address cognition/behavior during functional activity PT goals addressed during session: Mobility/safety with mobility OT goals addressed during session: ADL's and self-care      AM-PAC PT "6 Clicks" Mobility   Outcome Measure  Help needed turning from your back to your side while in a flat bed without using bedrails?: Total Help needed moving from lying on your back to sitting on the side of a flat bed without using bedrails?: Total Help needed moving to and from a bed to a chair (including a wheelchair)?: Total Help needed standing up from a chair using your arms (e.g., wheelchair or bedside chair)?: Total Help needed to walk in hospital room?: Total Help needed climbing 3-5 steps with a railing? : Total 6 Click Score: 6    End of Session   Activity Tolerance: Patient limited by lethargy Patient left: in chair;with call bell/phone within reach;with chair alarm set;Other (comment) (maxilift pad) Nurse Communication: Mobility status;Need for lift equipment PT Visit Diagnosis: Other abnormalities of gait and mobility (R26.89);Other symptoms and signs involving the nervous system (R29.898)     Time:  5784-6962 PT Time Calculation (min) (ACUTE ONLY): 30 min  Charges:  $Neuromuscular Re-education: 8-22 mins                     01/05/2020  Ginger Carne., PT Acute Rehabilitation Services [phone]  (pager) [phone]  (office)   Tessie Fass Dawana Asper 01/05/2020, 5:29 PM

## 2020-01-06 ENCOUNTER — Encounter (HOSPITAL_COMMUNITY): Payer: Self-pay | Admitting: Student in an Organized Health Care Education/Training Program

## 2020-01-06 DIAGNOSIS — I63411 Cerebral infarction due to embolism of right middle cerebral artery: Secondary | ICD-10-CM

## 2020-01-06 DIAGNOSIS — R739 Hyperglycemia, unspecified: Secondary | ICD-10-CM

## 2020-01-06 DIAGNOSIS — J449 Chronic obstructive pulmonary disease, unspecified: Secondary | ICD-10-CM

## 2020-01-06 DIAGNOSIS — F1910 Other psychoactive substance abuse, uncomplicated: Secondary | ICD-10-CM

## 2020-01-06 DIAGNOSIS — I639 Cerebral infarction, unspecified: Secondary | ICD-10-CM

## 2020-01-06 DIAGNOSIS — I201 Angina pectoris with documented spasm: Secondary | ICD-10-CM

## 2020-01-06 DIAGNOSIS — I69391 Dysphagia following cerebral infarction: Secondary | ICD-10-CM

## 2020-01-06 LAB — CBC
HCT: 42.5 % (ref 36.0–46.0)
Hemoglobin: 13.3 g/dL (ref 12.0–15.0)
MCH: 28.1 pg (ref 26.0–34.0)
MCHC: 31.3 g/dL (ref 30.0–36.0)
MCV: 89.7 fL (ref 80.0–100.0)
Platelets: 267 10*3/uL (ref 150–400)
RBC: 4.74 MIL/uL (ref 3.87–5.11)
RDW: 13.2 % (ref 11.5–15.5)
WBC: 9.2 10*3/uL (ref 4.0–10.5)
nRBC: 0 % (ref 0.0–0.2)

## 2020-01-06 NOTE — Progress Notes (Signed)
STROKE TEAM PROGRESS NOTE   INTERVAL HISTORY Patient is sitting up in bedside chair.  She is more alert and interactive today.  Her daughter is at the bedside.  Patient daughter feels that patient needs to be transferred to [facility] and she does not want her to go to skilled nursing facility for rehab.  Inpatient rehab team is assessing her but not sure if she has the stamina to participate in intensive therapy.  Neuro exam unchanged.  Vital signs stable.  VE Vitals:   01/06/20 0813 01/06/20 0856 01/06/20 0857 01/06/20 1207  BP: (!) 149/70   (!) 166/95  Pulse: 96   100  Resp: 18   18  Temp: 97.9 F (36.6 C)   98.6 F (37 C)  TempSrc: Axillary   Oral  SpO2: 97% 94% 97% 96%  Weight:      Height:        CBC:  Recent Labs  Lab 12/31/19 1514 01/01/20 1010 01/05/20 0612 01/06/20 0342  WBC 8.1   < > 9.5 9.2  NEUTROABS 5.5  --   --   --   HGB 13.5   < > 12.9 13.3  HCT 42.5   < > 41.1 42.5  MCV 88.7   < > 88.2 89.7  PLT 208   < > 257 267   < > = values in this interval not displayed.    Basic Metabolic Panel:  Recent Labs  Lab 01/03/20 0346 01/03/20 0346 01/04/20 0444 01/05/20 0612  NA 143  --  145  --   K 4.3  --  4.2  --   CL 111  --  114*  --   CO2 24  --  25  --   GLUCOSE 101*  --  118*  --   BUN 16  --  20  --   CREATININE 0.77  --  0.77  --   CALCIUM 9.2  --  9.8  --   MG 2.1  --  2.2  --   PHOS 3.1   < > 4.2 4.1   < > = values in this interval not displayed.    Lipid Panel:     Component Value Date/Time   CHOL 184 01/01/2020 0559   TRIG 103 01/02/2020 0626   HDL 38 (L) 01/01/2020 0559   CHOLHDL 4.8 01/01/2020 0559   VLDL 58 (H) 01/01/2020 0559   LDLCALC 88 01/01/2020 0559   HgbA1c:  Lab Results  Component Value Date   HGBA1C 5.5 01/01/2020   Urine Drug Screen:     Component Value Date/Time   LABOPIA NONE DETECTED 01/01/2020 1230   COCAINSCRNUR NONE DETECTED 01/01/2020 1230   LABBENZ NONE DETECTED 01/01/2020 1230   AMPHETMU NONE DETECTED  01/01/2020 1230   THCU NONE DETECTED 01/01/2020 1230   LABBARB NONE DETECTED 01/01/2020 1230    Alcohol Level No results found for: ETH  IMAGING past 24h No results found.   PHYSICAL EXAM      General - Well nourished, well developed, middle-aged African-American lady lethargic and drowsy  Ophthalmologic - fundi not visualized due to noncooperation.  Cardiovascular - Regular rhythm and rate.  Neuro -awake and bit more interactive today.. Oriented to place, self and age, but not to time. Severe dysarthria. Paucity of speech, not cooperative with naming or repeating, but following simple commands. Right gaze preference, not cross midline. Blinking to visual threat on the right consistently, but not consistent on the left. PERRL. Left facial droop. Tongue midline.  RUE and RLE spontaenous movement and against gravity 3/5. However, LUE 3/5 with pain and LLE 1/5 withdraw with pain.  Sensation and coordination not cooperative and gait not tested.    ASSESSMENT/PLAN [name] is a 59 y.o. female with history of polysubstance abuse-crack/cocaine, tobacco use, hypertension, coronary vasospasms, CAD and recent headaches  found on the bathroom floor with right gaze deviation and left-sided hemiplegia.  She did not receive IV t-PA due to late presentation (>4.5 hours from time of onset).  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 ASPECTS is 6. Chronic left basal ganglia infarct.   CTA H&N - Occlusion of the right ICA at its origin in the neck with intracranial reconstitution. Distal right M1 occlusion. Mild left vertebral artery origin stenosis.   CT Perfusion - Right MCA core infarct with penumbra  IR s/p thrombectomy with TICI 3 reperfusion and R ICA angioplasty   MRI head - Evolving moderate sized acute ischemic right MCA territory  infarct as above. No associated hemorrhage or mass effect. Underlying chronic bilateral basal ganglia lacunar infarcts, with additional small chronic left parietal infarct.  MRA head - Interval revascularization of previously occluded right ICA and MCA status post catheter directed thrombectomy. Underlying short-segment 3 mm severe mid right M1 stenosis.   Carotid Doppler - R ICA 80-99% stenosis  2D Echo EF 65 to 70%  [employer] Virus 2 - negative  LDL - 88  HgbA1c - 5.5  UDS neg  VTE prophylaxis - lovenox  aspirin 81 mg daily prior to admission, now on ASA 325 and plavix 75 DAPT due to severe right ICA stenosis.   Therapy recommendations:  CIR  Disposition:  Pending - [facility] CIR consult placed. [facility] IR contacted to evaluate.  Evaluated for transfer to [place] [facility] - they reviewed her case and feel these is nothing additional medically to offer her. They recommend transfer to rehab facility. They are available should she worsen.  Right ICA stenosis  CTA head and neck showed right ICA occlusion at origin  Status post right ICA angioplasty  Carotid Doppler post procedure right ICA 80 to 99% stenosis  On DAPT  Will need to discuss with Dr. Katherina Right Rodriguze regarding right ICA stenting once pt stabilized from current stroke  Respiratory distress, resolved  Overnight desaturation  On 10 L nasal cannula  CCM on board  Aggressive suctioning and nebulization  Much improved  CXR x2 negative  Close monitoring  Hypertension  Home BP meds: Norvasc ; Cardizem ; Lisinopril ; Imdur . Long-term BP goal normotensive  Hyperlipidemia  Home Lipid lowering medication: none   LDL 88, goal < 70  Current lipid lowering medication:  Lipitor 40 mg daily   Continue statin at discharge  Dysphagia . Secondary to stroke . NPO . Has Cortrak and TF . For MBSS tomorrow . Speech on board   Tobacco abuse  Current smoker  Smoking cessation counseling will be provided  Other  Stroke Risk Factors  Advanced age  ETOH use, advised to drink no more than 1 alcoholic beverage per day.  Obesity, Body mass index is 32.52 kg/m., recommend weight loss, diet and exercise as appropriate   Coronary artery disease  Previous strokes by imaging  Other Active Problems  Aortic Atherosclerosis   Emphysema   Leukocytosis - resolved  UTI - Rocephin started  6/19 x 7 days (UA c/w UTI but no culture performed) pt improving  Occasional tachycardia - monitor  Hospital day # 6 Continue ongoing therapies.  Consult rehab MD for possible inpatient rehab.  Continue core track tube for feeding.  Patient daughter is not willing for either PEG tube or SNF and prefers to wait longer for dysphagia to improve.  Discussed with patient and daughter and Dr. Delice Lesch rehab MD.  Greater than 50% time during this 25-minute visit was spent on counseling and coordination of care and discussion with patient and daughter and answering questions  Antony Contras, MD  To contact Stroke Continuity provider, please refer to [email or web address] After hours, contact General Neurology

## 2020-01-06 NOTE — Consult Note (Signed)
Physical Medicine and Rehabilitation Consult   Reason for Consult: Stroke with functional deficits.  Referring Physician: Dr. Leonie Man  HPI: Anne [name] is a 59 y.o. female with history of CAD, coronary vasospasms, COPD, polysubstance abuse in the past who was admitted on 12/31/2019 after found in a tub full of water with left hemiparesis and right gaze deviation.  History taken from chart review and daughter due to cognition.  UDS negative. CTA/perfusion cerebrum showed occlusion of right ICA at origin in the neck, distal right M1 occlusion and R-MCA core infract with 53 ml penumbra. She underwent cerebral angio with mechanical thrombectomy and complete recanalization of R-MCA and ACA and small to moderate SAH seen in right sylvian fissure post procedure. MRI brain personally reviewed, limited, but showing moderate/large right MCA infarct.  Per report and MRI, evolving moderate to large sized acute R-MCA infarct with interval revascularization of prior occluded R-ICA and MCA and focal severe stenosis of mid right M1 segment.  Hospital course complicated by respiratory distress on 01/01/2020 due to oral secretions and required suctioning as well as supplemental oxygen.  Echocardiogram with ejection fraction 65-70% with mild concentric LVH and mild AR. Carotid dopplers showed 80-99% R-ICA stenosis.  She developed fevers with leucocytosis and was started on Ceftriaxone due to concerns of UTI.  Cortak placed for nutritional support due to ongoing lethargy and concerns of aspiration with poststroke dysphagia. Stroke secondary to large vessel disease and patient on ASA 325/Plavix 75 due to severe R-ICA stenosis.  Lethargy resolving but patient continues to have limitations due to Right hemiplegia with right gaze preference and inability to track beyond midline to the left, dysphagia--NPO and minimal verbal output. CIR recommended due to functional deficits.  Discussed function, improvement, disposition  with neurology.  Review of Systems  Unable to perform ROS: Medical condition   Past Medical History:  Diagnosis Date  . Asthma   . CAD (coronary artery disease)    a. cardiac cath 02/04/2013: tubular 20% stenosis pRCA, tubular 30% stenosis mRCA, medically managed  . Coronary vasospasm (HCC)    a. cardiac cath 11/25/2014: mLCx 30%, pRCA 30%, moderate spasm w/ noted improvement w/ reimaging, recommended CCB, long acting nitrate, smoking cessation, & statin   . Hypertension   . Polysubstance abuse ([place])    a. remote history of crack/cocaine abuse approximately 8-10 years ago, ongoing tobacco abuse since age 30, rare etoh abuse     Past Surgical History:  Procedure Laterality Date  . CARDIAC CATHETERIZATION N/A 11/25/2014   Procedure: Left Heart Cath and Coronary Angiography;  Surgeon: Minna Merritts, MD;  Location: [facility];  Service: Cardiovascular;  Laterality: N/A;  . IR CT HEAD LTD  12/31/2019  . IR PERCUTANEOUS ART THROMBECTOMY/INFUSION INTRACRANIAL INC DIAG ANGIO  12/31/2019      . IR PERCUTANEOUS ART THROMBECTOMY/INFUSION INTRACRANIAL INC DIAG ANGIO  12/31/2019  . RADIOLOGY WITH ANESTHESIA Left 12/31/2019   Procedure: RADIOLOGY WITH ANESTHESIA;  Surgeon: Radiologist, Medication, MD;  Location: [facility];  Service: Radiology;  Laterality: Left;    Family History  Problem Relation Age of Onset  . Breast cancer Mother 41  . CAD Father 74    Social History:  Widowed earlier this year. Has two teenage grandchildren who live with her.  Daughter can assist after discharge. 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 She  reports that she has been smoking--per records used to smoke 2 PPD X 46 years.  She has never used smokeless tobacco. She  quit ETOH use years ago. She reports that she does not use drugs.    Allergies: No Known Allergies    Medications Prior to Admission  Medication Sig Dispense Refill  . albuterol (PROVENTIL HFA;VENTOLIN HFA) 108 (90 BASE) MCG/ACT inhaler  Inhale 2 puffs into the lungs every 6 (six) hours as needed for wheezing or shortness of breath. 1 Inhaler 1  . diltiazem (CARDIZEM CD) 180 MG 24 hr capsule Take 180 mg by mouth daily.    . nitroGLYCERIN (NITROSTAT) 0.4 MG SL tablet Place 1 tablet (0.4 mg total) under the tongue every 5 (five) minutes as needed for chest pain. 30 tablet 0  . traZODone (DESYREL) 100 MG tablet Take 100 mg by mouth at bedtime.    Marland Kitchen zolpidem (AMBIEN) 10 MG tablet Take 10 mg by mouth at bedtime.    Marland Kitchen albuterol (PROVENTIL) (2.5 MG/3ML) 0.083% nebulizer solution Take 3 mLs (2.5 mg total) by nebulization every 6 (six) hours as needed for wheezing or shortness of breath. (Patient not taking: Reported on 01/01/2020) 75 mL 0  . amLODipine (NORVASC) 5 MG tablet Take 1 tablet (5 mg total) by mouth daily. 30 tablet 1  . aspirin EC 81 MG EC tablet Take 1 tablet (81 mg total) by mouth daily. (Patient not taking: Reported on 11/03/2016) 30 tablet 1  . cetirizine (ZYRTEC) 10 MG tablet Take 10 mg by mouth daily as needed for allergies.     Marland Kitchen diltiazem (CARDIZEM CD) 120 MG 24 hr capsule Take 1 capsule (120 mg total) by mouth daily. (Patient not taking: Reported on 11/03/2016) 30 capsule 1  . esomeprazole (NEXIUM) 20 MG capsule Take 20 mg by mouth daily at 12 noon.    . famotidine (PEPCID) 20 MG tablet Take 1 tablet (20 mg total) by mouth 2 (two) times daily. 60 tablet 0  . Fluticasone-Salmeterol (ADVAIR) 250-50 MCG/DOSE AEPB Inhale 1 puff into the lungs 2 (two) times daily. (Patient not taking: Reported on 11/03/2016) 60 each 1  . isosorbide mononitrate (IMDUR) 30 MG 24 hr tablet Take 1 tablet (30 mg total) by mouth daily. 30 tablet 0  . lisinopril (PRINIVIL,ZESTRIL) 10 MG tablet Take 10 mg by mouth daily.    . metoCLOPramide (REGLAN) 10 MG tablet Take 1 tablet (10 mg total) by mouth every 6 (six) hours as needed. 30 tablet 0  . ondansetron (ZOFRAN ODT) 4 MG disintegrating tablet Take 1 tablet (4 mg total) by mouth every 8 (eight) hours as  needed for nausea or vomiting. 20 tablet 0    Home: Home Living Family/patient expects to be discharged to:: Private residence Living Arrangements: Other relatives Available Help at Discharge: Family, Available 24 hours/day Type of Home: House Home Access: Stairs to enter [employer] of Steps: 3 Entrance Stairs-Rails: None Home Layout: One level Bathroom Shower/Tub: Tub/shower unit, Architectural technologist: Standard Home Equipment: None  Lives With: Family  Functional History: Prior Function Level of Independence: Independent Comments: works Scientist, clinical (histocompatibility and immunogenetics) Status:  Mobility: Bed Mobility Overal bed mobility: Needs Assistance Bed Mobility: Supine to Sit Rolling: Total assist Supine to sit: Max assist, +2 for physical assistance General bed mobility comments: pt able to initiate advancing RLE to EOB but required MAX A +2 to advance LLE to EOB and elevate trunk with use of bed pad. Transfers Overall transfer level: Needs assistance Equipment used: 2 person hand held assist Transfers: Sit to/from Stand, Stand Pivot Transfers Sit to Stand: Max assist, +2 physical assistance Stand pivot transfers: Max assist, +2  physical assistance General transfer comment: MAX A +2 to stand pivot to recliner to pts R side. Pt required MAX A to power up into standing and initiate pivotal steps to recliner Ambulation/Gait General Gait Details: not able    ADL: ADL Overall ADL's : Needs assistance/impaired Eating/Feeding: NPO Grooming: Wash/dry hands, Sitting, Minimal assistance Grooming Details (indicate cue type and reason): MIN A for cleanliness to wash face from recliner Upper Body Bathing: Total assistance, Bed level Lower Body Bathing: Total assistance, Bed level Upper Body Dressing : Total assistance, Bed level Lower Body Dressing: Total assistance, Bed level Toilet Transfer: Maximal assistance, +2 for safety/equipment, Stand-pivot Toilet Transfer Details  (indicate cue type and reason): simulated via stand pivot to recliner wth MAX A +2 to pts R side Toileting- Clothing Manipulation and Hygiene: Total assistance, Bed level Toileting - Clothing Manipulation Details (indicate cue type and reason): Pt incontinent of urine - assisted with clean up and peri care at bed level  Functional mobility during ADLs: Maximal assistance, +2 for physical assistance (stand pivot only) General ADL Comments: pt able to complete UB ADLs with MIN A this session; MAX A +2 for stand pivot to recliner. continues to present with R gaze preference and flaccid LUE  Cognition: Cognition Overall Cognitive Status: Difficult to assess Arousal/Alertness: Lethargic Orientation Level: Oriented to person, Oriented to place, Oriented to time Attention: Sustained Sustained Attention: Impaired Sustained Attention Impairment: Verbal basic Awareness: Impaired Awareness Impairment: Emergent impairment Problem Solving: Impaired Problem Solving Impairment: Functional basic Cognition Arousal/Alertness: Lethargic Behavior During Therapy: Flat affect (tearful) Overall Cognitive Status: Difficult to assess General Comments: pt flat overall during session but responding appropriately to commands with increased time. Pt noted to become tearful during session. Difficult to assess due to: Impaired communication, Level of arousal   Blood pressure (!) 149/70, pulse 96, temperature 97.9 F (36.6 C), temperature source Axillary, resp. rate 18, height 5\' 6"  (1.676 m), weight 91.4 kg, SpO2 97 %. Physical Exam  Nursing note and vitals reviewed. Constitutional: No distress.  Left inattention and kept head flexed to the right. Cortak in place. Was working with ST on trials of liquids.   HENT:  Head: Normocephalic and atraumatic.  Right Ear: External ear normal.  Left Ear: External ear normal.  Nose: Nose normal.  Eyes:  Right gaze preference  Respiratory: Effort normal. No stridor. No  respiratory distress.  GI: She exhibits no distension.  Musculoskeletal:     Cervical back: Neck supple.     Comments: No edema or tenderness in extremities  Neurological: She is alert.  Very slow to initiate but she able to follow simple motor commands with delay.  Dysarthria Motor: RUE/RLE: 5/5 proximal distal LUE/LLE: 0/5 proximal distal Sensation appears intact light touch Upgoing Babinski on left lower extremity  Skin: Skin is warm and dry.  Psychiatric: Her affect is blunt. Her speech is delayed. She is slowed. She has a flat affect.    Results for orders placed or performed during the hospital encounter of 12/31/19 (from the past 24 hour(s))  CBC     Status: None   Collection Time: 01/06/20  3:42 AM  Result Value Ref Range   WBC 9.2 4.0 - 10.5 K/uL   RBC 4.74 3.87 - 5.11 MIL/uL   Hemoglobin 13.3 12.0 - 15.0 g/dL   HCT 42.5 36 - 46 %   MCV 89.7 80.0 - 100.0 fL   MCH 28.1 26.0 - 34.0 pg   MCHC 31.3 30.0 - 36.0 g/dL  RDW 13.2 11.5 - 15.5 %   Platelets 267 150 - 400 K/uL   nRBC 0.0 0.0 - 0.2 %   No results found.  Assessment/Plan: Diagnosis: Right MCA infarct Stroke: Continue secondary stroke prophylaxis and Risk Factor Modification listed below:   Antiplatelet therapy:   Blood Pressure Management:  Continue current medication with prn's with permisive HTN per primary team Statin Agent:   Tobacco abuse:   Left sided hemiparesis: fit for orthosis to prevent contractures (resting hand splint for day, wrist cock up splint at night, PRAFO, etc) PT/OT for mobility, ADL training  Labs and images (see above) independently reviewed.  Records reviewed and summated above.  1. Does the need for close, 24 hr/day medical supervision in concert with the patient's rehab needs make it unreasonable for this patient to be served in a less intensive setting? Yes  2. Co-Morbidities requiring supervision/potential complications: CAD, coronary vasospasms, COPD (monitor O2 sats and  respiratory rate with increased exertion), polysubstance abuse (counsel and appropriate), hyperglycemia (Monitor in accordance with exercise and adjust meds as necessary) 3. Due to bladder management, bowel management, safety, skin/wound care, disease management, medication administration and patient education, does the patient require 24 hr/day rehab nursing? Yes 4. Does the patient require coordinated care of a physician, rehab nurse, therapy disciplines of PT/OT/SLP to address physical and functional deficits in the context of the above medical diagnosis(es)? Yes Addressing deficits in the following areas: balance, endurance, locomotion, strength, transferring, bowel/bladder control, bathing, dressing, feeding, toileting, cognition, speech, language, swallowing and psychosocial support 5. Can the patient actively participate in an intensive therapy program of at least 3 hrs of therapy per day at least 5 days per week? Yes 6. The potential for patient to make measurable gains while on inpatient rehab is excellent 7. Anticipated functional outcomes upon discharge from inpatient rehab are min assist  with PT, min assist with OT, supervision and min assist with SLP. 8. Estimated rehab length of stay to reach the above functional goals is: 20-32 days. 9. Anticipated discharge destination: Home 10. Overall Rehab/Functional Prognosis: good  RECOMMENDATIONS: This patient's condition is appropriate for continued rehabilitative care in the following setting: Recommend CIR.  Patient's daughter would like to speak to family members regarding transfer to [facility] inpatient rehab.  She is planning on discussing with family members and deciding if she would prefer inpatient rehab at [facility]. Patient has agreed to participate in recommended program. Potentially Note that insurance prior authorization may be required for reimbursement for recommended care.  Comment: Rehab Admissions Coordinator to  follow up.  I have personally performed a face to face diagnostic evaluation, including, but not limited to relevant history and physical exam findings, of this patient and developed relevant assessment and plan.  Additionally, I have reviewed and concur with the physician assistant's documentation above.   Delice Lesch, MD, ABPMR Bary Leriche, PA-C 01/06/2020

## 2020-01-06 NOTE — Plan of Care (Signed)
  Problem: Health Behavior/Discharge Planning: Goal: Ability to manage health-related needs will improve 01/06/2020 0501 by Marcos Eke, RN Outcome: Progressing 01/06/2020 0501 by Carin Hock I, RN Outcome: Progressing   Problem: Clinical Measurements: Goal: Ability to maintain clinical measurements within normal limits will improve 01/06/2020 0501 by Marcos Eke, RN Outcome: Progressing 01/06/2020 0501 by Carin Hock I, RN Outcome: Progressing Goal: Will remain free from infection 01/06/2020 0501 by Marcos Eke, RN Outcome: Progressing 01/06/2020 0501 by Carin Hock I, RN Outcome: Progressing   Problem: Clinical Measurements: Goal: Will remain free from infection 01/06/2020 0501 by Marcos Eke, RN Outcome: Progressing 01/06/2020 0501 by Marcos Eke, RN Outcome: Progressing

## 2020-01-06 NOTE — Progress Notes (Signed)
  Speech Language Pathology Treatment: Dysphagia  Patient Details Name: Anne [name] MRN: [id number] DOB: [birth year] Today's Date: 01/06/2020 Time: 5170-0174 SLP Time Calculation (min) (ACUTE ONLY): 15 min  Assessment / Plan / Recommendation Clinical Impression  Pt was seen for dysphagia treatment with her daughter present. Pt was alert and cooperative throughout the session. No s/sx of aspiration were noted with ice chips or solid consistencies, but pt inconsistently exhibited coughing with thin liquids via cup and straw. Mastication was prolonged, but the pt's daughter stated that it was at baseline considering that her partial dentures are not at the hospital. It is recommended that pt's NPO status be maintained but she may have ice chips following oral care. A modified barium swallow study is recommended to further assess the functional integrity of the swallow mechanism. SLP will continue to follow pt.    HPI HPI: Pt is a 59 yo female presenting with L hemiplegia and R gaze preference. CT showed R MCA infarct. CTA revealed occlusion of R ICA. Pt is s/p thrombectomy on 6/17. PMH includes polysubstance abuse, HTN, coronary vasospasms, CAD, asthma.      SLP Plan  MBS       Recommendations  Diet recommendations: NPO Medication Administration: Via alternative means                Oral Care Recommendations: Oral care QID;Staff/trained caregiver to provide oral care Follow up Recommendations: Inpatient Rehab SLP Visit Diagnosis: Dysphagia, oropharyngeal phase (R13.12) Plan: MBS       Shavontae Gibeault I. Hardin Negus, [place], [place] Office number [phone] Pager [place] 01/06/2020, 12:14 PM

## 2020-01-06 NOTE — Progress Notes (Signed)
Physical Therapy Treatment Patient Details Name: Anne [name] MRN: [id number] DOB: April 30, 1961 Today's Date: 01/06/2020    History of Present Illness 59 y.o. female with history of polysubstance abuse-crack/cocaine, hypertension, coronary vasospasms and CAD.  Patient was brought to hospital as code stroke. Patient was found in the bathroom with the tub full and on the floor.  Patient was immediately brought to [facility] as code stroke from [facility].  On arrival patient had a right gaze deviation and left-sided hemiplegia.  Initial CT head did reveal progressing stroke.  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  Patient was immediately brought back to IR for thrombectomy on 6/17.    PT Comments    Pt a little more alert today, immediately turn her headand eyes L toward the door to name.  Emphasis placed on warm up exercise to mobilize R and help activate the L Ue and LE's, transition to EOB, sitting balance with rotation of trunk and neck L plus work on balance at EOB.  Pt lastly assist to stand and pivot to recliner.    Follow Up Recommendations  CIR     Equipment Recommendations  Wheelchair (measurements PT);Wheelchair cushion (measurements PT);Hospital bed    Recommendations for Other Services Rehab consult     Precautions / Restrictions Precautions Precautions: Fall Precaution Comments: right gaze preference, but able to attain left of midline today.    Mobility  Bed Mobility Overal bed mobility: Needs Assistance Bed Mobility: Supine to Sit     Supine to sit: Mod assist;+2 for physical assistance     General bed mobility comments: pt needed L LE assist but moved R LE to EOB on request.  assisted up via L elbow and assisted scoot.  Transfers Overall transfer level: Needs assistance Equipment used: None Transfers: Sit to/from [employer] Sit to Stand: Max assist;+2 physical assistance Stand pivot transfers: Max assist;+2 physical assistance       General transfer comment: pt assisted to come forward and up.  L LE blocked and assist with stability while R LE pivoted.  Ambulation/Gait                 Stairs             Wheelchair Mobility    Modified Rankin (Stroke Patients Only) Modified Rankin (Stroke Patients Only) Modified Rankin: Severe disability     Balance Overall balance assessment: Needs assistance Sitting-balance support: Single extremity supported;Feet supported Sitting balance-Leahy Scale: Poor       Standing balance-Leahy Scale: Zero Standing balance comment: face to face assist to attain standing and then supporting L knee                            Cognition Arousal/Alertness: Awake/alert Behavior During Therapy: Flat affect Overall Cognitive Status: Difficult to assess (NT formally overal;)                                        Exercises Other Exercises Other Exercises: PROM to pts LUE elbow flexion/ extension, wrist flexion/ extension Other Exercises: a/resisted ROM with R UE in bic/tricep pressess Other Exercises: A/Resisted ROM to R LE and PROM to L LE    General Comments        Pertinent  Vitals/Pain Pain Assessment: No/denies pain    Home Living                      Prior Function            PT Goals (current goals can now be found in the care plan section) Acute Rehab PT Goals PT Goal Formulation: With family Time For Goal Achievement: 01/15/20 Potential to Achieve Goals: Fair Progress towards PT goals: Progressing toward goals    Frequency    Min 4X/week      PT Plan Current plan remains appropriate    Co-evaluation              AM-PAC PT "6 Clicks" Mobility   Outcome Measure  Help needed turning from your back to your side while in a flat bed without using bedrails?: Total Help  needed moving from lying on your back to sitting on the side of a flat bed without using bedrails?: Total Help needed moving to and from a bed to a chair (including a wheelchair)?: Total Help needed standing up from a chair using your arms (e.g., wheelchair or bedside chair)?: Total Help needed to walk in hospital room?: Total Help needed climbing 3-5 steps with a railing? : Total 6 Click Score: 6    End of Session   Activity Tolerance: Patient tolerated treatment well Patient left: in chair;with call bell/phone within reach;with chair alarm set Nurse Communication: Mobility status;Need for lift equipment PT Visit Diagnosis: Other abnormalities of gait and mobility (R26.89)     Time: 5974-1638 PT Time Calculation (min) (ACUTE ONLY): 39 min  Charges:  $Therapeutic Activity: 23-37 mins $Neuromuscular Re-education: 8-22 mins                     01/06/2020  Ginger Carne., PT Acute Rehabilitation Services [phone]  (pager) [phone]  (office)   Tessie Fass Cordarryl Monrreal 01/06/2020, 5:51 PM

## 2020-01-07 ENCOUNTER — Inpatient Hospital Stay (HOSPITAL_COMMUNITY): Payer: Medicaid Other

## 2020-01-07 MED ORDER — LABETALOL HCL 5 MG/ML IV SOLN
20.0000 mg | Freq: Once | INTRAVENOUS | Status: AC
  Administered 2020-01-07: 20 mg via INTRAVENOUS
  Filled 2020-01-07: qty 4

## 2020-01-07 NOTE — Progress Notes (Addendum)
BP 188/120, HR 120, PRN hydralazine given.  16:55: BP 177/94, HR going to 130. Paged Dr. Leonie Man, order for Labetalol   17:08: HR 100

## 2020-01-07 NOTE — Progress Notes (Signed)
Occupational Therapy Treatment Note  Focus of session on neuromuscular re education, ADL retraining and increasing attention/visual scanning to left.  Able to sit EOB with min A. Pt able to self correct posture with increased ability to maintain near midline control R gaze preference, although able to sustain visual attention at midline when engaged in meaningful tasks. Increased ability to achieve sit - stand with use of Stedy with +2 Mod A.  R visual field occluded with 66M tape and noted improved posture and midline gaze. Daughter educated on intermittent use of glasses to work on L neglect. Educated daughter on importance of trying to have her Mom work at midline at this time instead of getting her to attend to L side due to severity of neglect. Also worked with daughter on cuing, including giving her Mom time to process information. 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 Continue to recommend CIR for rehab.     01/07/20 1400  OT Visit Information  Last OT Received On 01/07/20  Assistance Needed +2  PT/OT/SLP Co-Evaluation/Treatment Yes  Reason for Co-Treatment Complexity of the patient's impairments (multi-system involvement);Necessary to address cognition/behavior during functional activity;For patient/therapist safety;To address functional/ADL transfers  OT goals addressed during session ADL's and self-care;Strengthening/ROM  History of Present Illness 59 y.o. female with history of polysubstance abuse-crack/cocaine, hypertension, coronary vasospasms and CAD.  Patient was brought to hospital as code stroke. Patient was found in the bathroom with the tub full and on the floor.  Patient was immediately brought to [facility] as code stroke from [place].  On arrival patient had a right gaze deviation and left-sided hemiplegia.  Initial CT head did reveal progressing stroke.  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  Patient was immediately brought back to IR for thrombectomy on 6/17. 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   Precautions  Precautions Fall  Precaution Comments R gaze preference/L neglect  Pain Assessment  Pain Assessment No/denies pain  Cognition  Arousal/Alertness Awake/alert  Behavior During Therapy Flat affect (smiles appropriately )  Overall Cognitive Status Impaired/Different from baseline  Area of Impairment Attention;Following commands;Safety/judgement;Problem solving;Awareness;Orientation  Orientation Level Time  Current Attention Level Sustained  Following Commands Follows one step commands inconsistently;Follows one step commands with increased time  Safety/Judgement Decreased awareness of safety;Decreased awareness of deficits  Awareness Emergent  Problem Solving Slow processing;Decreased initiation;Difficulty sequencing;Requires verbal cues;Requires tactile cues  Difficult to assess due to Impaired communication  Upper Extremity Assessment  Upper Extremity Assessment LUE deficits/detail  LUE Deficits / Details No active ROM noted.  PROM WFL   ADL  Overall ADL's  Needs assistance/impaired  Eating/Feeding Details (indicate cue type and reason) Pt just passed her swallow test adn is able to drink thickened liquids; pt ableto verbalize that she passed her test but was unable to communicate need to have thickener; perseverating on wanting to drink water at times  Grooming Wash/dry face;Set up  Upper Body Bathing Maximal assistance;Sitting;Bed level  Upper Body Dressing  Maximal assistance;Sitting;Bed level  Functional mobility during ADLs Maximal assistance;+2 for physical assistance  Bed Mobility  Overal bed mobility Needs Assistance  Bed Mobility Rolling;Sidelying to Sit  Rolling Mod assist  Sidelying to sit Mod assist;+2 for safety/equipment  General bed mobility comments multimodal cues for sequencing and hand over hand  assist to reach with R UE to L bed rail; bed pad used to bring hips to EOB;  assist then to elevate trunk into sitting with pt pushing through R UE   Balance  Overall balance assessment Needs assistance  Sitting-balance support Single extremity supported;Feet supported  Sitting balance-Leahy Scale Poor  Sitting balance - Comments varying levels of assist needed however pt able to maintain sitting balance EOB with min guard-min A most of the time   Standing balance support Bilateral upper extremity supported  Standing balance-Leahy Scale Poor  Standing balance comment pt able to maintain standing upright with +2 assist and Stedy standing frame; worked on bilateral weight shifting  Vision- Assessment  Vision Assessment? Yes  Eye [facility]  Ocular Range of Motion Impaired-to be further tested in functional context  Alignment/Gaze Preference Gaze right  Tracking/Visual Pursuits Requires cues, head turns, or add eye shifts to track;Impaired - to be further tested in functional context (able to sustain visual attention to midline for @ 10 seconds)  Saccades Impaired - to be further tested in functional context;Decreased speed of saccadic movement  Visual Fields Left visual field deficit  Additional Comments R visual field occluded with 102M tape to increase attention to L. Once tape placed on glasses; pt noted to turn head to Moncia Annas L adn increase ability to turn eyes L; educated daughter on use of glasses for neglect - daughter verbalzied understanding  Transfers  Overall transfer level Needs assistance  Transfer via [place] to/from Stand  Sit to Stand +2 physical assistance;Mod assist;+2 safety/equipment  General transfer comment cues for sequencing, posture, attempting to look midline at daughter; pt stood X 2 trials with mod A +2 for anterior and R lateral weight shift, L UE support and R hand grip, and to facilitate hip extension with bed pad  General Comments   General comments (skin integrity, edema, etc.) sitting EOB, pt with L lateral lean; pt able to shift weight to R with VC however pt unable to self correct voluntarily; repetitive movemetns used from L sidelying through LUE then pushing up to center to facilitate trunk rotation, weight beraing through LUE and increasing attention to L side; worked from sitting position to facilitate reach out of BOS. Improvemetn of movement pattern noted as activities were more meaningful to pt. Pt did custodial work and appears to enjoy helping out by reaching adn handling cords,, towels, etc to therapist. Noted improvemetn in ability to look L and attend L during these tasks. Daughter presetn adn educated on need to stand in front of pt rather than directly on her L sdie due to significant amount of L neglect; Daughter also educated on need to ask her mom to complete a task adn then give her mom time to process information before asking again an again.   OT - End of Session  Equipment Utilized During Treatment Gait belt  Activity Tolerance Patient tolerated treatment well  Patient left in chair;with call bell/phone within reach;with chair alarm set;with family/visitor present  Nurse Communication Mobility status;Need for lift equipment;Other (comment) (use of positioning for L neglect)  OT Assessment/Plan  OT Plan Discharge plan remains appropriate  OT Visit Diagnosis Other abnormalities of gait and mobility (R26.89);Muscle weakness (generalized) (M62.81);Other symptoms and signs involving cognitive function;Low vision, both eyes (H54.2);Hemiplegia and hemiparesis;Feeding difficulties (R63.3)  Hemiplegia - Right/Left Left  Hemiplegia - dominant/non-dominant Non-Dominant  Hemiplegia - caused by Cerebral infarction  OT Frequency (ACUTE ONLY) Min 2X/week  Recommendations for Other Services Rehab consult  Follow Up Recommendations CIR  OT Equipment None recommended by OT  AM-PAC OT "  6 Clicks" Daily Activity Outcome  Measure (Version 2)  Help from another person eating meals? 2  Help from another person taking care of personal grooming? 3  Help from another person toileting, which includes using toliet, bedpan, or urinal? 1  Help from another person bathing (including washing, rinsing, drying)? 2  Help from another person to put on and taking off regular upper body clothing? 2  Help from another person to put on and taking off regular lower body clothing? 1  6 Click Score 11  OT Goal Progression  Progress towards OT goals Progressing toward goals  Acute Rehab OT Goals  Patient Stated Goal Daughter hopes pt will improve her ability to take care of self   OT Goal Formulation With patient/family  Time For Goal Achievement 01/15/20  Potential to Achieve Goals Good  ADL Goals  Pt Will Perform Grooming with min assist;sitting  Pt Will Perform Upper Body Bathing with mod assist;sitting  Additional ADL Goal #1 Pt attend to simple ADL tasks x 5 mins with min cues  Additional ADL Goal #2 Pt will locate items on Lt during ADLs wtih min cues  OT Time Calculation  OT Start Time (ACUTE ONLY) 1200  OT Stop Time (ACUTE ONLY) 1242  OT Time Calculation (min) 42 min  OT General Charges  $OT Visit 1 Visit  OT Treatments  $Self Care/Home Management  8-22 mins  $Neuromuscular Re-education 8-22 mins  Maurie Boettcher, OT/L   Acute OT Clinical Specialist [place] Pager [phone] Office [phone]

## 2020-01-07 NOTE — Progress Notes (Signed)
Nutrition Follow-up  DOCUMENTATION CODES:   Obesity unspecified  INTERVENTION:  Tube feeding via gastric Cortrak: - Osmolite 1.5 @ 50 ml/hr (1200 ml/day) - Pro-stat 30 ml BID - Free water per MD  Tube feeding regimen provides 2000 kcal, 105 grams of protein, and 914 ml of H2O (meets 100% of estimated needs).   RD will monitor for results of MBS and order nutrition supplements as appropriate.   NUTRITION DIAGNOSIS:   Inadequate oral intake related to lethargy/confusion as evidenced by NPO status.  Ongoing  GOAL:   Patient will meet greater than or equal to 90% of their needs  Met with TF  MONITOR:   Diet advancement, Labs, Weight trends, TF tolerance  REASON FOR ASSESSMENT:   Consult Enteral/tube feeding initiation and management  ASSESSMENT:   59 year old female who presented on 6/17 with acute stroke. CT head showed evolving stroke, CTA head and neck showed occlusion of the right internal carotid artery and MCA. PMH of polysubstance abuse, HTN, CAD, coronary vasospasms.  6/17 - s/p thrombectomy by IR 6/18 - Cortrak placement (gastric)  [name] declining PEG and SNF and would like to wait for pt's dysphagia to improve. MBS scheduled for today.   TF: Osmolite 1.5 cal @ 49m/hr, 355mPro-stat BID, 5027mree water Q6H (per MD)  Labs reviewed. Medications: Folvite, Thiamine  NUTRITION - FOCUSED PHYSICAL EXAM:   Unable to complete at this time, will attempt at follow-up.  Diet Order:   Diet Order            Diet NPO time specified  Diet effective now                 EDUCATION NEEDS:   No education needs have been identified at this time  Skin:  Skin Assessment: Skin Integrity Issues: Skin Integrity Issues:: Incisions Incisions: groin  Last BM:  6/23  Height:   Ht Readings from Last 1 Encounters:  12/31/19 '5\' 6"'$  (1.676 m)    Weight:   Wt Readings from Last 1 Encounters:  12/31/19 91.4 kg    Ideal Body Weight:  59.1 kg  BMI:   Body mass index is 32.52 kg/m.  Estimated Nutritional Needs:   Kcal:  1800-2000  Protein:  95-110 grams  Fluid:  >/= 1.8 L    AmaLarkin InaS, RD, LDN RD pager number and weekend/on-call pager number located in [facility]

## 2020-01-07 NOTE — Progress Notes (Signed)
Physical Therapy Treatment Patient Details Name: Anne [name] MRN: [id number] DOB: 21-Apr-1961 Today's Date: 01/07/2020    History of Present Illness 59 y.o. female with history of polysubstance abuse-crack/cocaine, hypertension, coronary vasospasms and CAD.  Patient was brought to hospital as code stroke. Patient was found in the bathroom with the tub full and on the floor.  Patient was immediately brought to [facility] as code stroke from [place].  On arrival patient had a right gaze deviation and left-sided hemiplegia.  Initial CT head did reveal progressing stroke.  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  Patient was immediately brought back to IR for thrombectomy on 6/17. 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     PT Comments    Patient is making progress toward PT goals. Pt able to stand X 2 trials with mod A + 2. Stedy standing frame utilized for work on sitting/standing balance and pre gait activities. Daughter present and supportive.  Pt remains a good candidate for CIR level therapies to maximize independence and safety with mobility.   Follow Up Recommendations  CIR     Equipment Recommendations  Wheelchair (measurements PT);Wheelchair cushion (measurements PT);Hospital bed    Recommendations for Other Services Rehab consult     Precautions / Restrictions Precautions Precautions: Fall Precaution Comments: R gaze preference/L neglect Restrictions Weight Bearing Restrictions: No    Mobility  Bed Mobility Overal bed mobility: Needs Assistance Bed Mobility: Rolling;Sidelying to Sit Rolling: Mod assist Sidelying to sit: Mod assist;+2 for safety/equipment       General bed mobility comments: multimodal cues for sequencing and hand over hand assist to reach with R UE to L bed rail; bed pad used to bring hips to EOB; assist then  to elevate trunk into sitting with pt pushing through R UE   Transfers Overall transfer level: Needs assistance   Transfers: Sit to/from Stand Sit to Stand: +2 physical assistance;Mod assist;+2 safety/equipment         General transfer comment: cues for sequencing, posture, attempting to look midline at daughter; pt stood X 2 trials with mod A +2 for anterior and R lateral weight shift, L UE support and R hand grip, and to facilitate hip extension with bed pad  Ambulation/Gait                 Stairs             Wheelchair Mobility    Modified Rankin (Stroke Patients Only) Modified Rankin (Stroke Patients Only) Pre-Morbid Rankin Score: No symptoms Modified Rankin: Severe disability     Balance Overall balance assessment: Needs assistance Sitting-balance support: Single extremity supported;Feet supported Sitting balance-Leahy Scale: Poor Sitting balance - Comments: varying levels of assist needed however pt able to maintain sitting balance EOB with min guard-min A most of the time    Standing balance support: Bilateral upper extremity supported Standing balance-Leahy Scale: Poor Standing balance comment: pt able to maintain standing upright with +2 assist and Stedy standing frame; worked on bilateral weight shifting                            Cognition Arousal/Alertness: Awake/alert Behavior During Therapy: Flat affect (smiles appropriately ) Overall Cognitive Status: Impaired/Different from baseline Area of Impairment: Attention;Following commands;Safety/judgement;Problem solving  Current Attention Level: Sustained   Following Commands: Follows one step commands inconsistently;Follows one step commands with increased time Safety/Judgement: Decreased awareness of safety;Decreased awareness of deficits   Problem Solving: Slow processing;Decreased initiation;Difficulty sequencing;Requires verbal cues;Requires tactile cues         Exercises      General Comments        Pertinent Vitals/Pain Pain Assessment: No/denies pain Faces Pain Scale: No hurt    Home Living                      Prior Function            PT Goals (current goals can now be found in the care plan section) Progress towards PT goals: Progressing toward goals    Frequency    Min 4X/week      PT Plan Current plan remains appropriate    Co-evaluation PT/OT/SLP Co-Evaluation/Treatment: Yes Reason for Co-Treatment: Complexity of the patient's impairments (multi-system involvement);Necessary to address cognition/behavior during functional activity;For patient/therapist safety;To address functional/ADL transfers PT goals addressed during session: Mobility/safety with mobility;Balance        AM-PAC PT "6 Clicks" Mobility   Outcome Measure  Help needed turning from your back to your side while in a flat bed without using bedrails?: Total Help needed moving from lying on your back to sitting on the side of a flat bed without using bedrails?: A Lot Help needed moving to and from a bed to a chair (including a wheelchair)?: Total Help needed standing up from a chair using your arms (e.g., wheelchair or bedside chair)?: A Lot Help needed to walk in hospital room?: Total Help needed climbing 3-5 steps with a railing? : Total 6 Click Score: 8    End of Session Equipment Utilized During Treatment: Oxygen Activity Tolerance: Patient tolerated treatment well Patient left: in chair;with call bell/phone within reach;with chair alarm set;with family/visitor present Nurse Communication: Mobility status;Need for lift equipment PT Visit Diagnosis: Other abnormalities of gait and mobility (R26.89)     Time: 3709-6438 PT Time Calculation (min) (ACUTE ONLY): 43 min  Charges:  $Gait Training: 8-22 mins                     Earney Navy, PTA Acute Rehabilitation Services Pager: [phone] Office: [phone]      Darliss Cheney 01/07/2020, 2:10 PM

## 2020-01-07 NOTE — Progress Notes (Signed)
Inpatient Rehab Admissions:  Inpatient Rehab Consult received.  I met with patient at the bedside for rehabilitation assessment and to discuss goals and expectations of an inpatient rehab admission.  She did engage with me in conversation, but unclear how much was understood.  Note potential for [facility].  Will call daughter and discuss tomorrow.     Signed: Shann Medal, PT, DPT Admissions Coordinator [phone] 01/07/20  4:55 PM

## 2020-01-07 NOTE — Progress Notes (Signed)
STROKE TEAM PROGRESS NOTE   INTERVAL HISTORY Patient just returned from modified barium swallow exam.  As per daughter she was cleared for liquid diet neuro exam unchanged.  Vital signs stable.  VE Vitals:   01/07/20 0332 01/07/20 0755 01/07/20 0803 01/07/20 1253  BP: (!) 160/91  (!) 151/107 (!) 149/104  Pulse: 99  (!) 101 (!) 101  Resp: 18  18 17   Temp: 98 F (36.7 C)  98.5 F (36.9 C) 98.6 F (37 C)  TempSrc: Axillary  Axillary Oral  SpO2: 95% 98% 98% 97%  Weight:      Height:        CBC:  Recent Labs  Lab 01/05/20 0612 01/06/20 0342  WBC 9.5 9.2  HGB 12.9 13.3  HCT 41.1 42.5  MCV 88.2 89.7  PLT 257 778    Basic Metabolic Panel:  Recent Labs  Lab 01/03/20 0346 01/03/20 0346 01/04/20 0444 01/05/20 0612  NA 143  --  145  --   K 4.3  --  4.2  --   CL 111  --  114*  --   CO2 24  --  25  --   GLUCOSE 101*  --  118*  --   BUN 16  --  20  --   CREATININE 0.77  --  0.77  --   CALCIUM 9.2  --  9.8  --   MG 2.1  --  2.2  --   PHOS 3.1   < > 4.2 4.1   < > = values in this interval not displayed.    Lipid Panel:     Component Value Date/Time   CHOL 184 01/01/2020 0559   TRIG 103 01/02/2020 0626   HDL 38 (L) 01/01/2020 0559   CHOLHDL 4.8 01/01/2020 0559   VLDL 58 (H) 01/01/2020 0559   LDLCALC 88 01/01/2020 0559   HgbA1c:  Lab Results  Component Value Date   HGBA1C 5.5 01/01/2020   Urine Drug Screen:     Component Value Date/Time   LABOPIA NONE DETECTED 01/01/2020 1230   COCAINSCRNUR NONE DETECTED 01/01/2020 1230   LABBENZ NONE DETECTED 01/01/2020 1230   AMPHETMU NONE DETECTED 01/01/2020 1230   THCU NONE DETECTED 01/01/2020 1230   LABBARB NONE DETECTED 01/01/2020 1230    Alcohol Level No results found for: [facility]  IMAGING past 24h DG Swallowing Func-Speech Pathology  Result Date: 01/07/2020 Objective Swallowing Evaluation: Type of Study: MBS-Modified Barium Swallow Study  Patient Details Name: Anne [name] MRN: [id number] Date of Birth: 1961-05-07  Today's Date: 01/07/2020 Time: SLP Start Time (ACUTE ONLY): 1100 -SLP Stop Time (ACUTE ONLY): 1120 SLP Time Calculation (min) (ACUTE ONLY): 20 min Past Medical History: Past Medical History: Diagnosis Date . Asthma  . CAD (coronary artery disease)   a. cardiac cath 02/04/2013: tubular 20% stenosis pRCA, tubular 30% stenosis mRCA, medically managed . Coronary vasospasm (HCC)   a. cardiac cath 11/25/2014: mLCx 30%, pRCA 30%, moderate spasm w/ noted improvement w/ reimaging, recommended CCB, long acting nitrate, smoking cessation, & statin  . Hypertension  . Polysubstance abuse ([place])   a. remote history of crack/cocaine abuse approximately 8-10 years ago, ongoing tobacco abuse since age 68, rare etoh abuse . Tubular adenoma  Past Surgical History: Past Surgical History: Procedure Laterality Date . CARDIAC CATHETERIZATION N/A 11/25/2014  Procedure: Left Heart Cath and Coronary Angiography;  Surgeon: Minna Merritts, MD;  Location: [facility];  Service: Cardiovascular;  Laterality: N/A; . IR CT HEAD LTD  12/31/2019 .  IR PERCUTANEOUS ART THROMBECTOMY/INFUSION INTRACRANIAL INC DIAG ANGIO  12/31/2019    . IR PERCUTANEOUS ART THROMBECTOMY/INFUSION INTRACRANIAL INC DIAG ANGIO  12/31/2019 . RADIOLOGY WITH ANESTHESIA Left 12/31/2019  Procedure: RADIOLOGY WITH ANESTHESIA;  Surgeon: Radiologist, Medication, MD;  Location: [facility];  Service: Radiology;  Laterality: Left; HPI: Pt is a 59 yo female presenting with L hemiplegia and R gaze preference. CT showed R MCA infarct. CTA revealed occlusion of R ICA. Pt is s/p thrombectomy on 6/17. PMH includes polysubstance abuse, HTN, coronary vasospasms, CAD, asthma. Assessment / Plan / Recommendation CHL IP CLINICAL IMPRESSIONS 01/07/2020 Clinical Impression Pt presents with oropharyngeal dysphagia characterized by impaired A-P transport, impaired bolus cohesion, and a pharyngeal delay. She demonstrated inconsistent lingual pumping, premature spillage to the valleculae and pyriform  sinuses, transient penetration (PAS 2) of nectar thick liquids via straw, and inconsistent aspiration (PAS 7, 8) of thin liquids. A 56mm barium tablet was administered, but pt masticated it despite encourgament to swallow it whole. A dysphagia 1 (puree) diet with nectar thick liquids is recommended at this time. Pt may have ice chips following oral care. SLP will follow for dysphagia treatment.  SLP Visit Diagnosis Dysphagia, oropharyngeal phase (R13.12) Attention and concentration deficit following -- Frontal lobe and executive function deficit following -- Impact on safety and function Mild aspiration risk;Moderate aspiration risk   CHL IP TREATMENT RECOMMENDATION 01/07/2020 Treatment Recommendations Therapy as outlined in treatment plan below   Prognosis 01/07/2020 Prognosis for Safe Diet Advancement Good Barriers to Reach Goals Cognitive deficits Barriers/Prognosis Comment -- CHL IP DIET RECOMMENDATION 01/07/2020 SLP Diet Recommendations Dysphagia 1 (Puree) solids;Nectar thick liquid Liquid Administration via Cup;Straw Medication Administration Crushed with puree Compensations Slow rate;Small sips/bites Postural Changes Seated upright at 90 degrees   CHL IP OTHER RECOMMENDATIONS 01/07/2020 Recommended Consults -- Oral Care Recommendations Oral care BID Other Recommendations --   CHL IP FOLLOW UP RECOMMENDATIONS 01/07/2020 Follow up Recommendations Inpatient Rehab   CHL IP FREQUENCY AND DURATION 01/07/2020 Speech Therapy Frequency (ACUTE ONLY) min 2x/week Treatment Duration 2 weeks      CHL IP ORAL PHASE 01/07/2020 Oral Phase Impaired Oral - Pudding Teaspoon -- Oral - Pudding Cup -- Oral - Honey Teaspoon -- Oral - Honey Cup -- Oral - Nectar Teaspoon -- Oral - Nectar Cup Impaired mastication;Lingual pumping;Decreased bolus cohesion;Premature spillage Oral - Nectar Straw Impaired mastication;Lingual pumping;Decreased bolus cohesion;Premature spillage Oral - Thin Teaspoon -- Oral - Thin Cup Impaired mastication;Lingual  pumping;Decreased bolus cohesion;Premature spillage Oral - Thin Straw Impaired mastication;Lingual pumping;Decreased bolus cohesion;Premature spillage Oral - Puree Impaired mastication;Lingual pumping;Decreased bolus cohesion;Premature spillage Oral - Mech Soft Impaired mastication;Lingual pumping;Decreased bolus cohesion;Premature spillage Oral - Regular Impaired mastication;Lingual pumping;Decreased bolus cohesion;Premature spillage Oral - Multi-Consistency -- Oral - Pill Impaired mastication;Lingual pumping;Decreased bolus cohesion;Premature spillage Oral Phase - Comment --  CHL IP PHARYNGEAL PHASE 01/07/2020 Pharyngeal Phase Impaired Pharyngeal- Pudding Teaspoon -- Pharyngeal -- Pharyngeal- Pudding Cup -- Pharyngeal -- Pharyngeal- Honey Teaspoon -- Pharyngeal -- Pharyngeal- Honey Cup -- Pharyngeal -- Pharyngeal- Nectar Teaspoon -- Pharyngeal -- Pharyngeal- Nectar Cup Delayed swallow initiation-vallecula;Delayed swallow initiation-pyriform sinuses Pharyngeal Material enters airway, remains ABOVE vocal cords then ejected out Pharyngeal- Nectar Straw Delayed swallow initiation-vallecula;Delayed swallow initiation-pyriform sinuses;Penetration/Aspiration during swallow Pharyngeal Material enters airway, remains ABOVE vocal cords and not ejected out Pharyngeal- Thin Teaspoon -- Pharyngeal -- Pharyngeal- Thin Cup Delayed swallow initiation-vallecula;Delayed swallow initiation-pyriform sinuses;Penetration/Aspiration during swallow Pharyngeal Material enters airway, passes BELOW cords and not ejected out despite cough attempt by patient;Material enters airway, passes BELOW cords without  attempt by patient to eject out (silent aspiration) Pharyngeal- Thin Straw Delayed swallow initiation-vallecula;Delayed swallow initiation-pyriform sinuses;Penetration/Aspiration during swallow Pharyngeal Material enters airway, passes BELOW cords and not ejected out despite cough attempt by patient;Material enters airway, passes BELOW  cords without attempt by patient to eject out (silent aspiration) Pharyngeal- Puree Delayed swallow initiation-vallecula Pharyngeal -- Pharyngeal- Mechanical Soft Delayed swallow initiation-vallecula Pharyngeal -- Pharyngeal- Regular Delayed swallow initiation-vallecula Pharyngeal -- Pharyngeal- Multi-consistency -- Pharyngeal -- Pharyngeal- Pill Delayed swallow initiation-vallecula;Delayed swallow initiation-pyriform sinuses Pharyngeal -- Pharyngeal Comment --  CHL IP CERVICAL ESOPHAGEAL PHASE 01/07/2020 Cervical Esophageal Phase WFL Pudding Teaspoon -- Pudding Cup -- Honey Teaspoon -- Honey Cup -- Nectar Teaspoon -- Nectar Cup -- Nectar Straw -- Thin Teaspoon -- Thin Cup -- Thin Straw -- Puree -- Mechanical Soft -- Regular -- Multi-consistency -- Pill -- Cervical Esophageal Comment -- Shanika I. Hardin Negus, [place], [place] Office number [phone] Pager [place] 01/07/2020, 1:09 PM                PHYSICAL EXAM      General - Well nourished, well developed, middle-aged African-American lady lethargic and drowsy  Ophthalmologic - fundi not visualized due to noncooperation.  Cardiovascular - Regular rhythm and rate.  Neuro -awake and   interactive  . Oriented to place, self and age, but not to time. Severe dysarthria. Paucity of speech, not cooperative with naming or repeating, but following simple commands. Right gaze preference, not cross midline. Blinking to visual threat on the right consistently, but not consistent on the left. PERRL. Left facial droop. Tongue midline. RUE and RLE spontaenous movement and against gravity 3/5. However, LUE 3/5 with pain and LLE 1/5 withdraw with pain.  Sensation and coordination not cooperative and gait not tested.    ASSESSMENT/PLAN [name] is a 59 y.o. female with history of polysubstance abuse-crack/cocaine, tobacco use, hypertension, coronary vasospasms, CAD and recent headaches  found on the bathroom  floor with right gaze deviation and left-sided hemiplegia.  She did not receive IV t-PA due to late presentation (>4.5 hours from time of onset).  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 ASPECTS is 6. Chronic left basal ganglia infarct.   CTA H&N - Occlusion of the right ICA at its origin in the neck with intracranial reconstitution. Distal right M1 occlusion. Mild left vertebral artery origin stenosis.   CT Perfusion - Right MCA core infarct with penumbra  IR s/p thrombectomy with TICI 3 reperfusion and R ICA angioplasty   MRI head - Evolving moderate sized acute ischemic right MCA territory infarct as above. No associated hemorrhage or mass effect. Underlying chronic bilateral basal ganglia lacunar infarcts, with additional small chronic left parietal infarct.  MRA head - Interval revascularization of previously occluded right ICA and MCA status post catheter directed thrombectomy. Underlying short-segment 3 mm severe mid right M1 stenosis.   Carotid Doppler - R ICA 80-99% stenosis  2D Echo EF 65 to 70%  [employer] Virus 2 - negative  LDL - 88  HgbA1c - 5.5  UDS neg  VTE prophylaxis - lovenox  aspirin 81 mg daily prior to admission, now on ASA 325 and plavix 75 DAPT due to severe right ICA stenosis.   Therapy recommendations:  CIR  Disposition:  Pending - [facility] CIR consult placed. [facility] IR contacted  to evaluate.  Evaluated for transfer to [facility] - they reviewed her case and feel these is nothing additional medically to offer her. They recommend transfer to rehab facility. They are available should she worsen.  Right ICA stenosis  CTA head and neck showed right ICA occlusion at origin  Status post right ICA angioplasty  Carotid Doppler post procedure right ICA 80 to 99% stenosis  On DAPT  Will need to discuss with Dr.  Katherina Right Rodriguze regarding right ICA stenting once pt stabilized from current stroke  Respiratory distress, resolved  Overnight desaturation  On 10 L nasal cannula  CCM on board  Aggressive suctioning and nebulization  Much improved  CXR x2 negative  Close monitoring  Hypertension  Home BP meds: Norvasc ; Cardizem ; Lisinopril ; Imdur . Long-term BP goal normotensive  Hyperlipidemia  Home Lipid lowering medication: none   LDL 88, goal < 70  Current lipid lowering medication:  Lipitor 40 mg daily   Continue statin at discharge  Dysphagia . Secondary to stroke . NPO . Has Cortrak and TF . For MBSS tomorrow . Speech on board   Tobacco abuse  Current smoker  Smoking cessation counseling will be provided  Other Stroke Risk Factors  Advanced age  ETOH use, advised to drink no more than 1 alcoholic beverage per day.  Obesity, Body mass index is 32.52 kg/m., recommend weight loss, diet and exercise as appropriate   Coronary artery disease  Previous strokes by imaging  Other Active Problems  Aortic Atherosclerosis   Emphysema   Leukocytosis - resolved  UTI - Rocephin started 6/19 x 7 days (UA c/w UTI but no culture performed) pt improving  Occasional tachycardia - monitor  Hospital day # 7 Continue ongoing therapies.   Start liquid diet and patient counseled to eat as much as possible.  We will keep the core track tube for today and if she can eat adequately will discontinue it tomorrow.  Hopefully transfer to inpatient rehab over the next few days after insurance approval and bed availability discussed with patient and daughter .  Greater than 50% time during this 25-minute visit was spent on counseling and coordination of care and discussion with patient and daughter and answering questions  Antony Contras, MD  To contact Stroke Continuity provider, please refer to [email or web address] After hours, contact General Neurology

## 2020-01-07 NOTE — Progress Notes (Signed)
  Speech Language Pathology Treatment: Dysphagia  Patient Details Name: Dara Beidleman MRN: [id number] DOB: 04-Nov-1960 Today's Date: 01/07/2020 Time: 1000-1018 SLP Time Calculation (min) (ACUTE ONLY): 18 min  Assessment / Plan / Recommendation Clinical Impression  Pt was seen for treatment with her daughter present. Pt was intermittently tearful throughout the session but was consoled by her daughter and the SLp regarding her progress. Both parties were educated regarding the nature of dysarthria, and compensatory strategies to improve speech intelligibility. They verbalized understanding regarding all areas of education. Pt did not use any of the compensatory strategies even when models and verbal prompts were giiven. Pt appeared preoccupied with her desire to have water and was resistant to redirection. She tolerated thin water via tsp and inconsistently via cup without overt s/sx of aspiration. She required cues for rate and bolus size and exhibited coughing with consecutive swallows and larger boluses. A modified barium swallow study will be conducted today.    HPI HPI: Pt is a 59 yo female presenting with L hemiplegia and R gaze preference. CT showed R MCA infarct. CTA revealed occlusion of R ICA. Pt is s/p thrombectomy on 6/17. PMH includes polysubstance abuse, HTN, coronary vasospasms, CAD, asthma.      SLP Plan  MBS       Recommendations  Diet recommendations: NPO Medication Administration: Via alternative means                Oral Care Recommendations: Oral care QID;Staff/trained caregiver to provide oral care Follow up Recommendations: Inpatient Rehab SLP Visit Diagnosis: Dysphagia, oropharyngeal phase (R13.12) Plan: MBS       Madicyn Mesina I. Hardin Negus, [place], [place] Office number [phone] Pager [place] 01/07/2020, 11:53 AM

## 2020-01-07 NOTE — Progress Notes (Signed)
Modified Barium Swallow Progress Note  Patient Details  Name: Anne [name] MRN: [id number] Date of Birth: 03/31/1961  Today's Date: 01/07/2020  Modified Barium Swallow completed.  Full report located under Chart Review in the Imaging Section.  Brief recommendations include the following:  Clinical Impression  Pt presents with oropharyngeal dysphagia characterized by impaired A-P transport, impaired bolus cohesion, and a pharyngeal delay. She demonstrated inconsistent lingual pumping, premature spillage to the valleculae and pyriform sinuses, transient penetration (PAS 2) of nectar thick liquids via straw, and inconsistent aspiration (PAS 7, 8) of thin liquids. A 5mm barium tablet was administered, but pt masticated it despite encourgament to swallow it whole. A dysphagia 1 (puree) diet with nectar thick liquids is recommended at this time. Pt may have ice chips following oral care. SLP will follow for dysphagia treatment.    Swallow Evaluation Recommendations       SLP Diet Recommendations: Dysphagia 1 (Puree) solids;Nectar thick liquid   Liquid Administration via: Cup;Straw   Medication Administration: Crushed with puree   Supervision: Staff to assist with self feeding   Compensations: Slow rate;Small sips/bites   Postural Changes: Seated upright at 90 degrees   Oral Care Recommendations: Oral care BID     Anne [name] I. Anne [name], Anne [name], [place] Office number [phone] Pager Anne [name] 01/07/2020,1:03 PM

## 2020-01-07 NOTE — TOC Progression Note (Signed)
Transition of Care [facility]) - Progression Note    Patient Details  Name: Anne [name] MRN: [id number] Date of Birth: [birth year]  Transition of Care [facility]) CM/SW Contact  Pollie Friar, RN Phone Number: 01/07/2020, 1:19 PM  Clinical Narrative:    Have reached out twice to [facility]'s IR to see if they can take a cortrak or if they would also have IR availability. No return calls. TOC following.   Expected Discharge Plan: IP Rehab Facility Barriers to Discharge: Continued Medical Work up  Expected Discharge Plan and Services Expected Discharge Plan: [place] In-house Referral: Clinical Social Work Discharge Planning Services: CM Consult                                           Social Determinants of Health (SDOH) Interventions    Readmission Risk Interventions No flowsheet data found.

## 2020-01-08 ENCOUNTER — Encounter (HOSPITAL_COMMUNITY): Payer: Self-pay | Admitting: Physical Medicine & Rehabilitation

## 2020-01-08 ENCOUNTER — Inpatient Hospital Stay (HOSPITAL_COMMUNITY)
Admission: RE | Admit: 2020-01-08 | Discharge: 2020-02-16 | DRG: 057 | Disposition: A | Discharge status: Home Health | Payer: Medicaid Other | Source: Intra-hospital | Attending: Physical Medicine & Rehabilitation | Admitting: Physical Medicine & Rehabilitation

## 2020-01-08 DIAGNOSIS — I2510 Atherosclerotic heart disease of native coronary artery without angina pectoris: Secondary | ICD-10-CM | POA: Diagnosis present

## 2020-01-08 DIAGNOSIS — G8194 Hemiplegia, unspecified affecting left nondominant side: Secondary | ICD-10-CM

## 2020-01-08 DIAGNOSIS — G479 Sleep disorder, unspecified: Secondary | ICD-10-CM | POA: Diagnosis not present

## 2020-01-08 DIAGNOSIS — I63511 Cerebral infarction due to unspecified occlusion or stenosis of right middle cerebral artery: Secondary | ICD-10-CM | POA: Diagnosis not present

## 2020-01-08 DIAGNOSIS — I69391 Dysphagia following cerebral infarction: Secondary | ICD-10-CM

## 2020-01-08 DIAGNOSIS — R414 Neurologic neglect syndrome: Secondary | ICD-10-CM | POA: Diagnosis present

## 2020-01-08 DIAGNOSIS — N390 Urinary tract infection, site not specified: Secondary | ICD-10-CM

## 2020-01-08 DIAGNOSIS — I69320 Aphasia following cerebral infarction: Secondary | ICD-10-CM

## 2020-01-08 DIAGNOSIS — R739 Hyperglycemia, unspecified: Secondary | ICD-10-CM | POA: Diagnosis present

## 2020-01-08 DIAGNOSIS — J449 Chronic obstructive pulmonary disease, unspecified: Secondary | ICD-10-CM | POA: Diagnosis present

## 2020-01-08 DIAGNOSIS — R159 Full incontinence of feces: Secondary | ICD-10-CM | POA: Diagnosis present

## 2020-01-08 DIAGNOSIS — K5900 Constipation, unspecified: Secondary | ICD-10-CM | POA: Diagnosis present

## 2020-01-08 DIAGNOSIS — J988 Other specified respiratory disorders: Secondary | ICD-10-CM

## 2020-01-08 DIAGNOSIS — I69354 Hemiplegia and hemiparesis following cerebral infarction affecting left non-dominant side: Principal | ICD-10-CM

## 2020-01-08 DIAGNOSIS — M79672 Pain in left foot: Secondary | ICD-10-CM

## 2020-01-08 DIAGNOSIS — M542 Cervicalgia: Secondary | ICD-10-CM

## 2020-01-08 DIAGNOSIS — I10 Essential (primary) hypertension: Secondary | ICD-10-CM

## 2020-01-08 DIAGNOSIS — R799 Abnormal finding of blood chemistry, unspecified: Secondary | ICD-10-CM

## 2020-01-08 DIAGNOSIS — Z87891 Personal history of nicotine dependence: Secondary | ICD-10-CM | POA: Diagnosis not present

## 2020-01-08 DIAGNOSIS — E785 Hyperlipidemia, unspecified: Secondary | ICD-10-CM

## 2020-01-08 MED ORDER — ALUM & MAG HYDROXIDE-SIMETH 200-200-20 MG/5ML PO SUSP: 30.0000 mL | ORAL | Status: DC | PRN

## 2020-01-08 MED ORDER — ENOXAPARIN SODIUM 40 MG/0.4ML ~~LOC~~ SOLN: 40.0000 mg | SUBCUTANEOUS | Status: DC

## 2020-01-08 MED ORDER — THIAMINE HCL 100 MG/ML IJ SOLN
100.0000 mg | Freq: Every day | INTRAMUSCULAR | Status: DC
  Administered 2020-01-12: 100 mg via INTRAVENOUS
  Filled 2020-01-08 (×9): qty 1

## 2020-01-08 MED ORDER — ORAL CARE MOUTH RINSE
15.0000 mL | Freq: Two times a day (BID) | OROMUCOSAL | Status: DC
  Administered 2020-01-08 – 2020-02-16 (×76): 15 mL via OROMUCOSAL

## 2020-01-08 MED ORDER — OSMOLITE 1.5 CAL PO LIQD
780.0000 mL | ORAL | Status: DC
  Filled 2020-01-08: qty 948

## 2020-01-08 MED ORDER — ADULT MULTIVITAMIN W/MINERALS CH
1.0000 | ORAL_TABLET | Freq: Every day | ORAL | Status: DC
  Administered 2020-01-08: 1 via ORAL
  Filled 2020-01-08: qty 1

## 2020-01-08 MED ORDER — GUAIFENESIN-DM 100-10 MG/5ML PO SYRP
5.0000 mL | ORAL_SOLUTION | Freq: Four times a day (QID) | ORAL | Status: DC | PRN
  Administered 2020-01-19: 10 mL
  Filled 2020-01-08: qty 10

## 2020-01-08 MED ORDER — BISACODYL 10 MG RE SUPP: 10.0000 mg | Freq: Every day | RECTAL | Status: DC | PRN

## 2020-01-08 MED ORDER — TRAZODONE HCL 50 MG PO TABS
25.0000 mg | ORAL_TABLET | Freq: Every evening | ORAL | Status: DC | PRN
  Administered 2020-01-16: 50 mg
  Filled 2020-01-08: qty 1

## 2020-01-08 MED ORDER — ALBUTEROL SULFATE (2.5 MG/3ML) 0.083% IN NEBU
2.5000 mg | INHALATION_SOLUTION | RESPIRATORY_TRACT | Status: DC | PRN
  Administered 2020-01-13: 2.5 mg via RESPIRATORY_TRACT
  Filled 2020-01-08: qty 3

## 2020-01-08 MED ORDER — ADULT MULTIVITAMIN W/MINERALS CH
1.0000 | ORAL_TABLET | Freq: Every day | ORAL | Status: DC
  Administered 2020-01-09 – 2020-01-20 (×12): 1
  Filled 2020-01-08 (×12): qty 1

## 2020-01-08 MED ORDER — ACETAMINOPHEN 325 MG PO TABS
325.0000 mg | ORAL_TABLET | ORAL | Status: DC | PRN
  Administered 2020-01-09 – 2020-01-17 (×9): 650 mg
  Filled 2020-01-08 (×9): qty 2

## 2020-01-08 MED ORDER — DILTIAZEM HCL 60 MG PO TABS: 30.0000 mg | ORAL_TABLET | Freq: Four times a day (QID) | ORAL | Status: DC

## 2020-01-08 MED ORDER — ENOXAPARIN SODIUM 40 MG/0.4ML ~~LOC~~ SOLN
40.0000 mg | SUBCUTANEOUS | Status: DC
  Administered 2020-01-08 – 2020-02-15 (×39): 40 mg via SUBCUTANEOUS
  Filled 2020-01-08 (×39): qty 0.4

## 2020-01-08 MED ORDER — CLOPIDOGREL BISULFATE 75 MG PO TABS
75.0000 mg | ORAL_TABLET | Freq: Every day | ORAL | Status: DC
  Administered 2020-01-09 – 2020-01-20 (×12): 75 mg
  Filled 2020-01-08 (×12): qty 1

## 2020-01-08 MED ORDER — FOLIC ACID 1 MG PO TABS
1.0000 mg | ORAL_TABLET | Freq: Every day | ORAL | Status: DC
  Administered 2020-01-09 – 2020-01-20 (×12): 1 mg
  Filled 2020-01-08 (×12): qty 1

## 2020-01-08 MED ORDER — ASPIRIN 325 MG PO TABS
325.0000 mg | ORAL_TABLET | Freq: Every day | ORAL | Status: DC
  Administered 2020-01-09 – 2020-01-20 (×12): 325 mg
  Filled 2020-01-08 (×12): qty 1

## 2020-01-08 MED ORDER — FREE WATER
100.0000 mL | Freq: Three times a day (TID) | Status: DC
  Administered 2020-01-08 – 2020-01-20 (×36): 100 mL

## 2020-01-08 MED ORDER — DIPHENHYDRAMINE HCL 12.5 MG/5ML PO ELIX: 12.5000 mg | ORAL_SOLUTION | Freq: Four times a day (QID) | ORAL | Status: DC | PRN

## 2020-01-08 MED ORDER — ATORVASTATIN CALCIUM 40 MG PO TABS
40.0000 mg | ORAL_TABLET | Freq: Every day | ORAL | Status: DC
  Administered 2020-01-09 – 2020-01-22 (×14): 40 mg
  Filled 2020-01-08 (×14): qty 1

## 2020-01-08 MED ORDER — SENNOSIDES 8.8 MG/5ML PO SYRP
10.0000 mL | ORAL_SOLUTION | Freq: Every evening | ORAL | Status: DC | PRN
  Filled 2020-01-08: qty 10

## 2020-01-08 MED ORDER — HYDRALAZINE HCL 10 MG PO TABS: 10.0000 mg | ORAL_TABLET | Freq: Four times a day (QID) | ORAL | Status: DC | PRN

## 2020-01-08 MED ORDER — HYDRALAZINE HCL 10 MG PO TABS
10.0000 mg | ORAL_TABLET | Freq: Four times a day (QID) | ORAL | Status: DC | PRN
  Filled 2020-01-08: qty 1

## 2020-01-08 MED ORDER — THIAMINE HCL 100 MG PO TABS
100.0000 mg | ORAL_TABLET | Freq: Every day | ORAL | Status: DC
  Administered 2020-01-09 – 2020-01-20 (×11): 100 mg
  Filled 2020-01-08 (×13): qty 1

## 2020-01-08 MED ORDER — DILTIAZEM HCL 60 MG PO TABS
30.0000 mg | ORAL_TABLET | Freq: Four times a day (QID) | ORAL | Status: DC
  Administered 2020-01-08 – 2020-01-20 (×51): 30 mg
  Filled 2020-01-08 (×47): qty 1

## 2020-01-08 MED ORDER — PROCHLORPERAZINE EDISYLATE 10 MG/2ML IJ SOLN: 5.0000 mg | Freq: Four times a day (QID) | INTRAMUSCULAR | Status: DC | PRN

## 2020-01-08 MED ORDER — FREE WATER: 50.0000 mL | Freq: Four times a day (QID) | Status: DC

## 2020-01-08 MED ORDER — PROCHLORPERAZINE 25 MG RE SUPP: 12.5000 mg | Freq: Four times a day (QID) | RECTAL | Status: DC | PRN

## 2020-01-08 MED ORDER — CHLORHEXIDINE GLUCONATE CLOTH 2 % EX PADS
6.0000 | MEDICATED_PAD | Freq: Every day | CUTANEOUS | Status: DC
  Administered 2020-01-09 – 2020-02-07 (×6): 6 via TOPICAL

## 2020-01-08 MED ORDER — PRO-STAT SUGAR FREE PO LIQD
30.0000 mL | Freq: Two times a day (BID) | ORAL | Status: DC
  Administered 2020-01-08 – 2020-01-20 (×24): 30 mL
  Filled 2020-01-08 (×24): qty 30

## 2020-01-08 MED ORDER — PROCHLORPERAZINE MALEATE 5 MG PO TABS
5.0000 mg | ORAL_TABLET | Freq: Four times a day (QID) | ORAL | Status: DC | PRN
  Filled 2020-01-08: qty 2

## 2020-01-08 MED ORDER — FLEET ENEMA 7-19 GM/118ML RE ENEM: 1.0000 | ENEMA | Freq: Once | RECTAL | Status: DC | PRN

## 2020-01-08 MED ORDER — POLYETHYLENE GLYCOL 3350 17 G PO PACK
17.0000 g | PACK | Freq: Every day | ORAL | Status: DC | PRN
  Filled 2020-01-08: qty 1

## 2020-01-08 MED ORDER — OSMOLITE 1.5 CAL PO LIQD
1000.0000 mL | ORAL | Status: DC
  Administered 2020-01-08 – 2020-01-10 (×3): 1000 mL
  Filled 2020-01-08 (×4): qty 1000

## 2020-01-08 MED ORDER — LORATADINE 10 MG PO TABS: 10.0000 mg | ORAL_TABLET | Freq: Every day | ORAL | Status: DC | PRN
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
## 2020-01-08 NOTE — Progress Notes (Addendum)
Report given to CIR RN, belongings returned to patient

## 2020-01-08 NOTE — Progress Notes (Signed)
  Speech Language Pathology Treatment: Dysphagia  Patient Details Name: Anne [name] MRN: [id number] DOB: [birth year] Today's Date: 01/08/2020 Time: 5597-4163 SLP Time Calculation (min) (ACUTE ONLY): 24 min  Assessment / Plan / Recommendation Clinical Impression  Pt was seen for treatment with her daughter present for the majority of the session. Nursing reported that the has been tolerating her meals without difficulty. Pt appeared fatigued but stated that she wanted ice chips. Oral care was provided and pt consumed ice chips without s/sx of aspiration. She demonstrated 100% accuracy with simple reasoning tasks and with temporal orientation. She consistently required cues for abstract reasoning tasks. She was re-educated regarding compensatory strategies for speech intelligibility and used overarticulation at the word level with 70% accuracy increasing to 100% with models. SLP will continue to follow pt.    HPI HPI: Pt is a 59 yo female presenting with L hemiplegia and R gaze preference. CT showed R MCA infarct. CTA revealed occlusion of R ICA. Pt is s/p thrombectomy on 6/17. PMH includes polysubstance abuse, HTN, coronary vasospasms, CAD, asthma.      SLP Plan  Continue with current plan of care       Recommendations  Diet recommendations: Dysphagia 1 (puree);Nectar-thick liquid Liquids provided via: Cup;Straw Medication Administration: Via alternative means Supervision: Full supervision/cueing for compensatory strategies;Staff to assist with self feeding Compensations: Slow rate;Small sips/bites                Oral Care Recommendations: Oral care QID;Staff/trained caregiver to provide oral care Follow up Recommendations: Inpatient Rehab SLP Visit Diagnosis: Dysphagia, oropharyngeal phase (R13.12) Plan: Continue with current plan of care       Anne [name] I. Hardin Negus, [place], [place] Office number [phone] Pager [place] 01/08/2020, 12:49 PM
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
## 2020-01-08 NOTE — PMR Pre-admission (Addendum)
PMR Admission Coordinator Pre-Admission Assessment  Patient: Anne [name] is an 59 y.o., female MRN: [id number] DOB: 1960/10/25 Height: 5' 6" (167.6 cm) Weight: 91.4 kg              Insurance Information HMO:     PPO:      PCP:      IPA:      80/20:      OTHER:  PRIMARY: Medicaid [place] [employer]      Policy#: [id number] p      Subscriber: pt CM Name:       Phone#:      Fax#:  Coverage Code: MAF-CN      Employer:  Benefits:  Phone #:      Name:  Eff. Date: 01/08/2020     Deduct:       Out of Pocket Max:       Life Max:   CIR:       SNF:  Outpatient:      Co-Pay:  Home Health:       Co-Pay:  DME:      Co-Pay:  Providers:   SECONDARY:       Policy#:       Phone#:   Development worker, community:       Phone#:   The Engineer, petroleum" for patients in Inpatient Rehabilitation Facilities with attached "Privacy Act [place] Records" was provided and verbally reviewed with: N/A  Emergency Contact Information Contact Information     Name Relation Home Work Mobile   Bache,[name] Daughter [phone]     Catalina Lunger [phone]        Current Medical History  Patient Admitting Diagnosis: R MCA CVA History of Present Illness: Anne [name] is a 59 y.o. female with history of CAD, coronary vasospasms, COPD, polysubstance abuse in the past, who was admitted on 12/31/2019 after found in a tub full of water with left hemiparesis and right gaze deviation.  History taken from chart review and daughter due to cognition.  UDS negative. CTA/perfusion cerebrum showed occlusion of right ICA at origin in the neck, distal right M1 occlusion and R-MCA core infract with 53 ml penumbra. She underwent cerebral angio with mechanical thrombectomy and complete recanalization of R-MCA and ACA and small to moderate SAH seen in right sylvian fissure post procedure. MRI brain personally reviewed, limited, but showing moderate/large right MCA infarct.  Per report and MRI,  evolving moderate to large sized acute R-MCA infarct with interval revascularization of prior occluded R-ICA and MCA and focal severe stenosis of mid right M1 segment.  Hospital course complicated by respiratory distress on 01/01/2020 due to oral secretions and required suctioning as well as supplemental oxygen.  Echocardiogram with ejection fraction 65-70% with mild concentric LVH and mild AR. Carotid dopplers showed 80-99% R-ICA stenosis.   She developed fevers with leucocytosis and was started on Ceftriaxone due to concerns of UTI.  Cortak placed for nutritional support due to ongoing lethargy and concerns of aspiration with poststroke dysphagia. Stroke secondary to large vessel disease and patient on ASA 325/Plavix 75 due to severe R-ICA stenosis.  Lethargy resolving but patient continues to have limitations due to Right hemiplegia with right gaze preference and inability to track beyond midline to the left, dysphagia--NPO and minimal verbal output. CIR recommended due to functional deficits.   Complete NIHSS TOTAL: 16  Past Medical History  Past Medical History:  Diagnosis Date   Asthma    CAD (coronary artery disease)    a. cardiac  cath 02/04/2013: tubular 20% stenosis pRCA, tubular 30% stenosis mRCA, medically managed   Coronary vasospasm (HCC)    a. cardiac cath 11/25/2014: mLCx 30%, pRCA 30%, moderate spasm w/ noted improvement w/ reimaging, recommended CCB, long acting nitrate, smoking cessation, & statin    Hypertension    Polysubstance abuse ([place])    a. remote history of crack/cocaine abuse approximately 8-10 years ago, ongoing tobacco abuse since age 45, rare etoh abuse   Tubular adenoma     Family History  family history includes Breast cancer (age of onset: 21) in her mother; CAD (age of onset: 90) in her father.  Prior Rehab/Hospitalizations:  Has the patient had prior rehab or hospitalizations prior to admission? No  Has the patient had major surgery during 100 days prior to  admission? Yes  Current Medications   Current Facility-Administered Medications:    0.9 %  sodium chloride infusion, 250 mL, Intravenous, Continuous, Amie Portland, MD, Last Rate: 10 mL/hr at 01/04/20 1800, Rate Verify at 01/04/20 1800   acetaminophen (TYLENOL) tablet 650 mg, 650 mg, Oral, Q4H PRN, 650 mg at 01/04/20 1704 **OR** acetaminophen (TYLENOL) 160 MG/5ML solution 650 mg, 650 mg, Per Tube, Q4H PRN, 650 mg at 01/07/20 0635 **OR** acetaminophen (TYLENOL) suppository 650 mg, 650 mg, Rectal, Q4H PRN, Marliss Coots, PA-C   albuterol (PROVENTIL) (2.5 MG/3ML) 0.083% nebulizer solution 2.5 mg, 2.5 mg, Nebulization, Q4H PRN, Dewaine Oats, Katalina M, NP   aspirin tablet 325 mg, 325 mg, Per Tube, Daily, Rosalin Hawking, MD, 325 mg at 01/08/20 1010   atorvastatin (LIPITOR) tablet 40 mg, 40 mg, Per Tube, Daily, Rosalin Hawking, MD, 40 mg at 01/08/20 1010   budesonide (PULMICORT) nebulizer solution 0.25 mg, 0.25 mg, Nebulization, BID, Ramaswamy, Murali, MD, 0.25 mg at 01/07/20 2015   Chlorhexidine Gluconate Cloth 2 % PADS 6 each, 6 each, Topical, Q0600, Amie Portland, MD, 6 each at 01/08/20 0543   clopidogrel (PLAVIX) tablet 75 mg, 75 mg, Per Tube, Daily, Rosalin Hawking, MD, 75 mg at 01/08/20 1010   enoxaparin (LOVENOX) injection 40 mg, 40 mg, Subcutaneous, Q24H, Rosalin Hawking, MD, 40 mg at 01/07/20 2135   feeding supplement (OSMOLITE 1.5 CAL) liquid 1,000 mL, 1,000 mL, Per Tube, Continuous, Rosalin Hawking, MD, Last Rate: 50 mL/hr at 01/08/20 0839, 1,000 mL at 01/08/20 0839   feeding supplement (PRO-STAT SUGAR FREE 64) liquid 30 mL, 30 mL, Per Tube, BID, Rosalin Hawking, MD, 30 mL at 44/01/02 7253   folic acid (FOLVITE) tablet 1 mg, 1 mg, Per Tube, Daily, Hayden Pedro M, NP, 1 mg at 01/08/20 1010   free water 50 mL, 50 mL, Per Tube, Q6H, Omar Person, NP, 50 mL at 01/08/20 0543   hydrALAZINE (APRESOLINE) injection 5 mg, 5 mg, Intravenous, Q6H PRN, Omar Person, NP, 5 mg at 01/07/20 1623   MEDLINE mouth  rinse, 15 mL, Mouth Rinse, BID, Amie Portland, MD, 15 mL at 01/08/20 1013   senna-docusate (Senokot-S) tablet 1 tablet, 1 tablet, Oral, QHS PRN, Marliss Coots, PA-C   thiamine tablet 100 mg, 100 mg, Per Tube, Daily, 100 mg at 01/08/20 1010 **OR** thiamine (B-1) injection 100 mg, 100 mg, Intravenous, Daily, Dewaine Oats, Danie Chandler, NP  Patients Current Diet:  Diet Order             DIET - DYS 1 Room service appropriate? Yes; Fluid consistency: Nectar Thick  Diet effective now  Precautions / Restrictions Precautions Precautions: Fall Precaution Comments: R gaze preference/L neglect Restrictions Weight Bearing Restrictions: No   Has the patient had 2 or more falls or a fall with injury in the past year?No  Prior Activity Level Community (5-7x/wk): active at baseline, driving, no DME used, caring for her grandkids on the weekend, keeping her house up, worked as a Sports coach in [place] Prior Function Level of Independence: Independent Comments: works [place]: Did the patient need help bathing, dressing, using the toilet or eating?  Independent  Indoor Mobility: Did the patient need assistance with walking from room to room (with or without device)? Independent  Stairs: Did the patient need assistance with internal or external stairs (with or without device)? Independent  Functional Cognition: Did the patient need help planning regular tasks such as shopping or remembering to take medications? Independent  Home Assistive Devices / Equipment Home Equipment: None  Prior Device Use: Indicate devices/aids used by the patient prior to current illness, exacerbation or injury? None of the above  Current Functional Level Cognition  Arousal/Alertness: Lethargic Overall Cognitive Status: Impaired/Different from baseline Difficult to assess due to: Impaired communication Current Attention Level: Sustained Orientation  Level: Oriented X4 Following Commands: Follows one step commands inconsistently, Follows one step commands with increased time Safety/Judgement: Decreased awareness of safety, Decreased awareness of deficits General Comments: pt flat overall during session but responding appropriately to commands with increased time. Pt noted to become tearful during session. Attention: Sustained Sustained Attention: Impaired Sustained Attention Impairment: Verbal basic Awareness: Impaired Awareness Impairment: Emergent impairment Problem Solving: Impaired Problem Solving Impairment: Functional basic    Extremity Assessment (includes Sensation/Coordination)  Upper Extremity Assessment: LUE deficits/detail RUE Deficits / Details: Pt moves Rt UE spontaneously  LUE Deficits / Details: No active ROM noted.  PROM WFL  LUE Coordination: decreased fine motor, decreased gross motor  Lower Extremity Assessment: Defer to PT evaluation RLE Deficits / Details: wiggles toes, flicker of activity in R quad/hip, otherwise limited AROM LLE Deficits / Details: flaccid LLE, no AROM, PROM WFL. Does withdraw to pain    ADLs  Overall ADL's : Needs assistance/impaired Eating/Feeding: NPO Eating/Feeding Details (indicate cue type and reason): Pt just passed her swallow test adn is able to drink thickened liquids; pt ableto verbalize that she passed her test but was unable to communicate need to have thickener; perseverating on wanting to drink water at times Grooming: Wash/dry face, Set up Grooming Details (indicate cue type and reason): MIN A for cleanliness to wash face from recliner Upper Body Bathing: Maximal assistance, Sitting, Bed level Lower Body Bathing: Total assistance, Bed level Upper Body Dressing : Maximal assistance, Sitting, Bed level Lower Body Dressing: Total assistance, Bed level Toilet Transfer: Maximal assistance, +2 for safety/equipment, Stand-pivot Toilet Transfer Details (indicate cue type and  reason): simulated via stand pivot to recliner wth MAX A +2 to pts R side Toileting- Clothing Manipulation and Hygiene: Total assistance, Bed level Toileting - Clothing Manipulation Details (indicate cue type and reason): Pt incontinent of urine - assisted with clean up and peri care at bed level  Functional mobility during ADLs: Maximal assistance, +2 for physical assistance General ADL Comments: pt able to complete UB ADLs with MIN A this session; MAX A +2 for stand pivot to recliner. continues to present with R gaze preference and flaccid LUE    Mobility  Overal bed mobility: Needs Assistance Bed Mobility: Rolling, Sidelying to Sit Rolling: Mod assist Sidelying to  sit: Mod assist, +2 for safety/equipment Supine to sit: Mod assist, +2 for physical assistance General bed mobility comments: multimodal cues for sequencing and hand over hand assist to reach with R UE to L bed rail; bed pad used to bring hips to EOB; assist then to elevate trunk into sitting with pt pushing through R UE     Transfers  Overall transfer level: Needs assistance Equipment used: None Transfer via Lift Equipment: Stedy Transfers: Sit to/from Stand Sit to Stand: +2 physical assistance, Mod assist, +2 safety/equipment Stand pivot transfers: Max assist, +2 physical assistance General transfer comment: cues for sequencing, posture, attempting to look midline at daughter; pt stood X 2 trials with mod A +2 for anterior and R lateral weight shift, L UE support and R hand grip, and to facilitate hip extension with bed pad    Ambulation / Gait / Stairs / Wheelchair Mobility  Ambulation/Gait General Gait Details: not able    Posture / Balance Dynamic Sitting Balance Sitting balance - Comments: varying levels of assist needed however pt able to maintain sitting balance EOB with min guard-min A most of the time  Balance Overall balance assessment: Needs assistance Sitting-balance support: Single extremity supported, Feet  supported Sitting balance-Leahy Scale: Poor Sitting balance - Comments: varying levels of assist needed however pt able to maintain sitting balance EOB with min guard-min A most of the time  Standing balance support: Bilateral upper extremity supported Standing balance-Leahy Scale: Poor Standing balance comment: pt able to maintain standing upright with +2 assist and Stedy standing frame; worked on bilateral weight shifting    Special needs/care consideration Insurance underwriter     Previous Environmental health practitioner (from acute therapy documentation) Living Arrangements: Other relatives  Lives With: Family Available Help at Discharge: Family, Available 24 hours/day Type of Home: House Home Layout: One level Home Access: Stairs to enter Entrance Stairs-Rails: None Technical brewer of Steps: 3 Bathroom Shower/Tub: Public librarian, Architectural technologist: Standard  Discharge Living Setting Plans for Discharge Living Setting: Patient's home Type of Home at Discharge: House Discharge Home Layout: One level Discharge Home Access: Stairs to enter Entrance Stairs-Rails: None Entrance Stairs-Number of Steps: 2 Discharge Bathroom Shower/Tub: Tub/shower unit Discharge Bathroom Toilet: Standard Discharge Bathroom Accessibility: Yes How Accessible: Accessible via wheelchair Does the patient have any problems obtaining your medications?: No  Social/Family/Support Systems Patient Roles: Caregiver (for her grandkids on the weekend) Anticipated Caregiver: Ruhi Kopke (daughter) is primary contact, will have many caregivers Anticipated Caregiver's Contact Information: [phone] Ability/Limitations of Caregiver: min/mod assist Caregiver Availability: 24/7 Discharge Plan Discussed with Primary Caregiver: Yes Is Caregiver In Agreement with Plan?: Yes Does Caregiver/Family have Issues with Lodging/Transportation while Pt is in Rehab?: No   Goals Patient/Family Goal  for Rehab: PT/OT/SLP min assist Expected length of stay: 27-30 days Pt/Family Agrees to Admission and willing to participate: Yes Program Orientation Provided & Reviewed with Pt/Caregiver Including Roles  & Responsibilities: Yes  Barriers to Discharge: Insurance for SNF coverage   Decrease burden of Care through IP rehab admission: n/a  Possible need for SNF placement upon discharge: Not anticipated   Patient Condition: This patient's medical and functional status has changed since the consult dated: 01/06/2020 in which the Rehabilitation Physician determined and documented that the patient's condition is appropriate for intensive rehabilitative care in an inpatient rehabilitation facility. See "History of Present Illness" (above) for medical update. Functional changes are: pt mod +2 to transfer, mod/max for ADLs. Patient's medical and functional status update has  been discussed with the Rehabilitation physician and patient remains appropriate for inpatient rehabilitation. Will admit to inpatient rehab today.  Preadmission Screen Completed By:  Michel Santee, PT, DPT 01/08/2020 11:10 AM ______________________________________________________________________   Discussed status with Dr. Posey Pronto on 01/08/20 at 11:20 AM  and received approval for admission today.  Admission Coordinator:  Michel Santee, PT, DPT time 11:20 AM Sudie Grumbling 01/08/20

## 2020-01-08 NOTE — Discharge Summary (Addendum)
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 Etiology: lethargy/confusion/dysphagia Hyperglycemia   Active Problems:   Stroke (cerebrum) (HCC)   Acute respiratory failure with hypoxia (HCC)   Acute ischemic stroke (HCC)   Dysphagia, post-stroke   Hyperglycemia   Chronic obstructive pulmonary disease (HCC)  Past Medical History:  Diagnosis Date   Asthma    CAD (coronary artery disease)    a. cardiac cath 02/04/2013: tubular 20% stenosis pRCA, tubular 30% stenosis mRCA, medically managed   Coronary vasospasm (HCC)    a. cardiac cath 11/25/2014: mLCx 30%, pRCA 30%, moderate spasm w/ noted improvement w/ reimaging, recommended CCB, long acting nitrate, smoking cessation, & statin    Hypertension    Polysubstance abuse ([place])    a. remote history of crack/cocaine abuse approximately 8-10 years ago, ongoing tobacco abuse since age 50, rare etoh abuse   Tubular adenoma    Past Surgical History:  Procedure Laterality Date   CARDIAC CATHETERIZATION N/A 11/25/2014   Procedure: Left Heart Cath and Coronary Angiography;  Surgeon: Minna Merritts, MD;  Location: [facility];  Service: Cardiovascular;  Laterality: N/A;   IR CT HEAD LTD  12/31/2019   IR PERCUTANEOUS ART THROMBECTOMY/INFUSION INTRACRANIAL INC DIAG ANGIO  12/31/2019        IR PERCUTANEOUS ART THROMBECTOMY/INFUSION INTRACRANIAL INC DIAG ANGIO  12/31/2019   RADIOLOGY WITH ANESTHESIA Left 12/31/2019   Procedure: RADIOLOGY WITH ANESTHESIA;  Surgeon: Radiologist, Medication, MD;  Location: [facility];  Service: Radiology;  Laterality: Left;    HOSPITAL MEDICATIONS  aspirin  325 mg Per Tube Daily   atorvastatin  40 mg Per Tube Daily   budesonide (PULMICORT) nebulizer solution  0.25 mg Nebulization BID   Chlorhexidine Gluconate Cloth  6 each Topical Q0600   clopidogrel  75 mg Per Tube Daily   enoxaparin (LOVENOX) injection  40 mg Subcutaneous Q24H   feeding supplement (PRO-STAT SUGAR FREE 64)  30 mL Per Tube BID   folic acid  1 mg Per Tube Daily   free water  50 mL Per Tube Q6H   mouth rinse  15 mL Mouth Rinse BID   thiamine  100 mg Per Tube Daily   Or   thiamine  100 mg Intravenous Daily    HOME MEDICATIONS PRIOR TO ADMISSION Medications Prior to Admission  Medication Sig Dispense Refill   albuterol (PROVENTIL HFA;VENTOLIN HFA) 108 (90 BASE) MCG/ACT inhaler Inhale 2 puffs into the lungs every 6 (six) hours as needed for wheezing or shortness of breath. 1 Inhaler 1   diltiazem (CARDIZEM CD) 180 MG 24 hr capsule Take 180 mg by mouth daily.     nitroGLYCERIN (NITROSTAT) 0.4 MG SL tablet Place 1 tablet (0.4 mg total) under the tongue every 5 (five) minutes as needed for chest pain. 30 tablet 0   traZODone (DESYREL) 100  MG tablet Take 100 mg by mouth at bedtime.     zolpidem (AMBIEN) 10 MG tablet Take 10 mg by mouth at bedtime.     albuterol (PROVENTIL) (2.5 MG/3ML) 0.083% nebulizer solution Take 3 mLs (2.5 mg total) by nebulization every 6 (six) hours as needed for wheezing or shortness of breath. (Patient not taking: Reported on 01/01/2020) 75 mL 0   amLODipine (NORVASC) 5 MG tablet Take 1 tablet (5 mg total) by mouth daily. 30 tablet 1   aspirin EC 81 MG EC tablet Take 1 tablet (81 mg total) by mouth daily. (Patient not taking: Reported on 11/03/2016) 30 tablet 1    cetirizine (ZYRTEC) 10 MG tablet Take 10 mg by mouth daily as needed for allergies.      diltiazem (CARDIZEM CD) 120 MG 24 hr capsule Take 1 capsule (120 mg total) by mouth daily. (Patient not taking: Reported on 11/03/2016) 30 capsule 1   esomeprazole (NEXIUM) 20 MG capsule Take 20 mg by mouth daily at 12 noon.     famotidine (PEPCID) 20 MG tablet Take 1 tablet (20 mg total) by mouth 2 (two) times daily. 60 tablet 0   Fluticasone-Salmeterol (ADVAIR) 250-50 MCG/DOSE AEPB Inhale 1 puff into the lungs 2 (two) times daily. (Patient not taking: Reported on 11/03/2016) 60 each 1   isosorbide mononitrate (IMDUR) 30 MG 24 hr tablet Take 1 tablet (30 mg total) by mouth daily. 30 tablet 0   lisinopril (PRINIVIL,ZESTRIL) 10 MG tablet Take 10 mg by mouth daily.     metoCLOPramide (REGLAN) 10 MG tablet Take 1 tablet (10 mg total) by mouth every 6 (six) hours as needed. 30 tablet 0   ondansetron (ZOFRAN ODT) 4 MG disintegrating tablet Take 1 tablet (4 mg total) by mouth every 8 (eight) hours as needed for nausea or vomiting. 20 tablet 0      LABORATORY STUDIES CBC    Component Value Date/Time   WBC 9.2 01/06/2020 0342   RBC 4.74 01/06/2020 0342   HGB 13.3 01/06/2020 0342   HGB 14.0 01/22/2014 1325   HCT 42.5 01/06/2020 0342   HCT 43.4 01/22/2014 1325   PLT 267 01/06/2020 0342   PLT 245 01/22/2014 1325   MCV 89.7 01/06/2020 0342   MCV 88 01/22/2014 1325   MCH 28.1 01/06/2020 0342   MCHC 31.3 01/06/2020 0342   RDW 13.2 01/06/2020 0342   RDW 13.5 01/22/2014 1325   LYMPHSABS 1.9 12/31/2019 1514   MONOABS 0.4 12/31/2019 1514   EOSABS 0.2 12/31/2019 1514   BASOSABS 0.1 12/31/2019 1514   CMP    Component Value Date/Time   NA 145 01/04/2020 0444   NA 140 01/22/2014 1325   K 4.2 01/04/2020 0444   K 4.3 01/22/2014 1325   CL 114 (H) 01/04/2020 0444   CL 109 (H) 01/22/2014 1325   CO2 25 01/04/2020 0444   CO2 25 01/22/2014 1325   GLUCOSE 118 (H) 01/04/2020 0444   GLUCOSE 122 (H) 01/22/2014 1325    BUN 20 01/04/2020 0444   BUN 11 01/22/2014 1325   CREATININE 0.77 01/04/2020 0444   CREATININE 1.29 01/22/2014 1325   CALCIUM 9.8 01/04/2020 0444   CALCIUM 9.0 01/22/2014 1325   PROT 6.3 (L) 01/02/2020 0626   ALBUMIN 3.2 (L) 01/02/2020 0626   AST 17 01/02/2020 0626   ALT 9 01/02/2020 0626   ALKPHOS 82 01/02/2020 0626   BILITOT 0.3 01/02/2020 0626   GFRNONAA >60 01/04/2020 0444   GFRNONAA 47 (L) 01/22/2014 1325  GFRAA >60 01/04/2020 0444   GFRAA 54 (L) 01/22/2014 1325   COAGS Lab Results  Component Value Date   INR 1.2 01/02/2020   INR 1.1 12/31/2019   Lipid Panel    Component Value Date/Time   CHOL 184 01/01/2020 0559   TRIG 103 01/02/2020 0626   HDL 38 (L) 01/01/2020 0559   CHOLHDL 4.8 01/01/2020 0559   VLDL 58 (H) 01/01/2020 0559   LDLCALC 88 01/01/2020 0559   HgbA1C  Lab Results  Component Value Date   HGBA1C 5.5 01/01/2020   Urinalysis    Component Value Date/Time   COLORURINE YELLOW 01/02/2020 0244   APPEARANCEUR CLOUDY (A) 01/02/2020 0244   LABSPEC 1.016 01/02/2020 0244   PHURINE 5.0 01/02/2020 0244   GLUCOSEU 50 (A) 01/02/2020 0244   HGBUR SMALL (A) 01/02/2020 0244   BILIRUBINUR NEGATIVE 01/02/2020 0244   KETONESUR NEGATIVE 01/02/2020 0244   PROTEINUR NEGATIVE 01/02/2020 0244   NITRITE POSITIVE (A) 01/02/2020 0244   LEUKOCYTESUR LARGE (A) 01/02/2020 0244   Urine Drug Screen     Component Value Date/Time   LABOPIA NONE DETECTED 01/01/2020 1230   COCAINSCRNUR NONE DETECTED 01/01/2020 1230   LABBENZ NONE DETECTED 01/01/2020 1230   AMPHETMU NONE DETECTED 01/01/2020 1230   THCU NONE DETECTED 01/01/2020 1230   LABBARB NONE DETECTED 01/01/2020 1230    Alcohol Level No results found for: ETH   SIGNIFICANT DIAGNOSTIC STUDIES  MR ANGIO HEAD WO CONTRAST Result Date: 12/31/2019 CLINICAL DATA:  Follow-up examination for acute stroke. EXAM: MRI HEAD WITHOUT CONTRAST MRA HEAD WITHOUT CONTRAST TECHNIQUE: Multiplanar, multiecho pulse sequences of the  brain and surrounding structures were obtained without intravenous contrast. Angiographic images of the head were obtained using MRA technique without contrast. COMPARISON:  Prior CTs from earlier the same day. FINDINGS: MRI HEAD FINDINGS Brain: Examination technically limited due to motion artifact and the patient's inability to tolerate the full length of the exam. Generalized age-related cerebral atrophy. Mild patchy chronic microvascular ischemic changes noted within the pons. Remote lacunar infarcts noted involving the left greater than right basal ganglia. Associated small amount of hemosiderin staining noted about the chronic left basal ganglia lacunar infarct. Additional small cortical subcortical infarct noted at the left parietal lobe (series 13, image 19). Patchy and confluent restricted diffusion seen involving the right MCA distribution, with involvement of the right insula, operculum, and overlying right frontal parietal cortex. Mild patchy involvement of the right basal ganglia and deep cerebral white matter noted as well. No associated hemorrhage or mass effect. No other evidence for acute or subacute ischemia. Gray-white matter differentiation otherwise maintained. No other evidence for acute or chronic intracranial hemorrhage. No mass lesion, midline shift or mass effect. No hydrocephalus or extra-axial fluid collection. Vascular: Major intracranial vascular flow voids are maintained. Skull and upper cervical spine: Craniocervical junction grossly within normal limits, although lack of a true sagittal T1 sequence somewhat limits evaluation. Bone marrow signal intensity within normal limits. No scalp soft tissue abnormality. Sinuses/Orbits: Globes and orbital soft tissues within normal limits. Chronic right maxillary sinusitis noted, with additional chronic mucoperiosteal thickening in opacity throughout the remaining paranasal sinuses. No significant mastoid effusion. Inner ear structures grossly  normal. Other: None. MRA HEAD FINDINGS ANTERIOR CIRCULATION: Examination degraded by motion artifact. Distal cervical right ICA is now patent status post catheter directed intervention. Right ICA remains patent to the terminus without appreciable stenosis or other abnormality. Left ICA also widely patent to the terminus without appreciable stenosis. A1 segments patent, right A1 is  hypoplastic, accounting for the slightly diminutive right ICA is compared to the left. Grossly normal anterior communicating artery complex. Anterior cerebral arteries grossly patent proximally, not well evaluated distally due to motion. Right M1 segment patent proximally. Focal severe stenosis of the mid right M1 segment noted (series 1047, image 12). This measures approximately 3 mm in length. Finding grossly similar to prior CTA. Right MCA bifurcation not well assessed due to motion. Distal right MCA branches grossly perfused and patent. Left M1 segment remains widely patent. Normal left MCA bifurcation. Distal left MCA branches grossly perfused, although not well assessed due to motion. POSTERIOR CIRCULATION: Vertebral arteries grossly patent to the bifurcation with the left being dominant. Neither PICA well visualized. Basilar remains grossly patent to its distal aspect without stenosis. Superior cerebral arteries grossly patent proximally. PCA supplied via the basilar as well as robust bilateral posterior communicating arteries. PCAs remain grossly patent to their distal aspects without obvious stenosis. IMPRESSION: MRI HEAD IMPRESSION: 1. Technically limited exam due to motion artifact. 2. Evolving moderate to large sized acute ischemic right MCA territory infarct as above. No associated hemorrhage or mass effect. 3. Underlying chronic bilateral basal ganglia lacunar infarcts, with additional small chronic left parietal infarct. MRA HEAD IMPRESSION: 1. Technically limited exam due to extensive motion artifact. 2. Interval  revascularization of previously occluded right ICA and MCA status post catheter directed thrombectomy. 3. Underlying short-segment 3 mm severe mid right M1 stenosis. 4. Otherwise grossly stable intracranial MRA as compared to prior CTA. Electronically Signed   By: Jeannine Boga M.D.   On: 12/31/2019 22:05   MR BRAIN WO CONTRAST Result Date: 12/31/2019 CLINICAL DATA:  Follow-up examination for acute stroke. EXAM: MRI HEAD WITHOUT CONTRAST MRA HEAD WITHOUT CONTRAST TECHNIQUE: Multiplanar, multiecho pulse sequences of the brain and surrounding structures were obtained without intravenous contrast. Angiographic images of the head were obtained using MRA technique without contrast. COMPARISON:  Prior CTs from earlier the same day. FINDINGS: MRI HEAD FINDINGS Brain: Examination technically limited due to motion artifact and the patient's inability to tolerate the full length of the exam. Generalized age-related cerebral atrophy. Mild patchy chronic microvascular ischemic changes noted within the pons. Remote lacunar infarcts noted involving the left greater than right basal ganglia. Associated small amount of hemosiderin staining noted about the chronic left basal ganglia lacunar infarct. Additional small cortical subcortical infarct noted at the left parietal lobe (series 13, image 19). Patchy and confluent restricted diffusion seen involving the right MCA distribution, with involvement of the right insula, operculum, and overlying right frontal parietal cortex. Mild patchy involvement of the right basal ganglia and deep cerebral white matter noted as well. No associated hemorrhage or mass effect. No other evidence for acute or subacute ischemia. Gray-white matter differentiation otherwise maintained. No other evidence for acute or chronic intracranial hemorrhage. No mass lesion, midline shift or mass effect. No hydrocephalus or extra-axial fluid collection. Vascular: Major intracranial vascular flow voids are  maintained. Skull and upper cervical spine: Craniocervical junction grossly within normal limits, although lack of a true sagittal T1 sequence somewhat limits evaluation. Bone marrow signal intensity within normal limits. No scalp soft tissue abnormality. Sinuses/Orbits: Globes and orbital soft tissues within normal limits. Chronic right maxillary sinusitis noted, with additional chronic mucoperiosteal thickening in opacity throughout the remaining paranasal sinuses. No significant mastoid effusion. Inner ear structures grossly normal. Other: None. MRA HEAD FINDINGS ANTERIOR CIRCULATION: Examination degraded by motion artifact. Distal cervical right ICA is now patent status post catheter directed intervention. Right  ICA remains patent to the terminus without appreciable stenosis or other abnormality. Left ICA also widely patent to the terminus without appreciable stenosis. A1 segments patent, right A1 is hypoplastic, accounting for the slightly diminutive right ICA is compared to the left. Grossly normal anterior communicating artery complex. Anterior cerebral arteries grossly patent proximally, not well evaluated distally due to motion. Right M1 segment patent proximally. Focal severe stenosis of the mid right M1 segment noted (series 1047, image 12). This measures approximately 3 mm in length. Finding grossly similar to prior CTA. Right MCA bifurcation not well assessed due to motion. Distal right MCA branches grossly perfused and patent. Left M1 segment remains widely patent. Normal left MCA bifurcation. Distal left MCA branches grossly perfused, although not well assessed due to motion. POSTERIOR CIRCULATION: Vertebral arteries grossly patent to the bifurcation with the left being dominant. Neither PICA well visualized. Basilar remains grossly patent to its distal aspect without stenosis. Superior cerebral arteries grossly patent proximally. PCA supplied via the basilar as well as robust bilateral posterior  communicating arteries. PCAs remain grossly patent to their distal aspects without obvious stenosis. IMPRESSION: MRI HEAD IMPRESSION: 1. Technically limited exam due to motion artifact. 2. Evolving moderate to large sized acute ischemic right MCA territory infarct as above. No associated hemorrhage or mass effect. 3. Underlying chronic bilateral basal ganglia lacunar infarcts, with additional small chronic left parietal infarct. MRA HEAD IMPRESSION: 1. Technically limited exam due to extensive motion artifact. 2. Interval revascularization of previously occluded right ICA and MCA status post catheter directed thrombectomy. 3. Underlying short-segment 3 mm severe mid right M1 stenosis. 4. Otherwise grossly stable intracranial MRA as compared to prior CTA. Electronically Signed   By: Jeannine Boga M.D.   On: 12/31/2019 22:05   IR CT Head Ltd 01/05/2020 INDICATION: 59 year old female with past medical history significant for bone substance abuse, hypertension, coronary vasospasms and CAD; baseline modified Rankin scale 0. She was brought to an outside hospital after being found down. At admission, right gaze deviation and left-sided hemiplegia were noted, NIHSS 18. She was last seen well at 9:30 a.m. on 12/31/2019. No intravenous tPA administered as she was outside the window. Head CT showed ongoing right MCA territory infarct with hypodensity in the right insula, basal ganglia, right frontal and temporoparietal regions. CT angiogram of the head and neck showed occlusion of the right internal carotid artery at the bulb with mixed density plaque as well as occlusion of distal right M1/MCA segment. CT perfusion showed a core infarct of 53 mL with an ischemic penumbra of 151 mL. She was taken to our service for emergency mechanical thrombectomy. EXAM: Diagnostic cerebral angiogram and mechanical thrombectomy COMPARISON:  CT/CT angiogram of the head and neck December 31, 2019. MEDICATIONS: No antibiotics administered  ANESTHESIA/SEDATION: The procedure was performed under general anesthesia. FLUOROSCOPY TIME:  Fluoroscopy Time: 25 minutes 18 seconds (871 mGy). COMPLICATIONS: None immediate. TECHNIQUE: Informed written consent was obtained from the patient's daughter after a thorough discussion of the procedural risks, benefits and alternatives. All questions were addressed. Maximal Sterile Barrier Technique was utilized including caps, mask, sterile gowns, sterile gloves, sterile drape, hand hygiene and skin antiseptic. A timeout was performed prior to the initiation of the procedure. The right groin was prepped and draped in the usual sterile fashion. Using a micropuncture kit and the modified Seldinger technique, access was gained to the right common femoral artery and an 8 French sheath was placed. Under fluoroscopy, an 8 [place] Walrus balloon guide catheter was  navigated over a 6 [place] Berenstein 2 catheter and a 0.035" Terumo Glidewire into the aortic arch. The catheter was placed into the right common carotid artery. A roadmap was obtained. The Berenstein 2 catheter was then advanced over the wire across the occluded right carotid bulb into the distal cervical segment of the right ICA. The balloon guide catheter was then advanced over the [facility] 2 catheter into the distal cervical right ICA. The Berenstein 2 catheter was removed. No angiogram was obtained given no blood return was seen. FINDINGS: Occlusion of the right internal carotid artery at the bulb with mixed density plaque. PROCEDURE: Under biplane roadmap, a large bore aspiration catheter was navigated over a phenom 21 microcatheter and a synchro support microguidewire into the cavernous segment of the right ICA. The microcatheter was then navigated over the wire into the right M2/MCA posterior division branch. Then, a 6 x 40 mm solitaire stent retriever was deployed spanning the mid to distal M1 and M2 segments. The device was allowed to intercalated with the  clot for 4 minutes. The microcatheter was removed. The aspiration catheter was advanced to the level of occlusion and connected to a penumbra aspiration pump. The guiding catheter balloon was inflated. The thrombectomy device and aspiration catheter were removed under constant aspiration. Right ICA angiogram showed recanalization of the right M1 segment with near complete revascularization of the right MCA vascular tree with the exception of slow flow into M4 branches to the mid frontal region. Embolus to the right A3/ACA segment was noted, likely from right carotid bulb crossing. Under biplane roadmap, a large bore aspiration catheter was navigated over a phenom 21 microcatheter and a synchro support microguidewire into the cavernous segment of the right ICA. The microcatheter was then navigated over the wire into the right A3/ACA. Then, a 3 mm solitaire stent retriever was deployed spanning the A3 segment. The device was allowed to intercalated with the clot for 4 minutes. The aspiration catheter was advanced to the level of the ICA terminus and connected to a penumbra aspiration pump. The guiding catheter balloon was inflated. The thrombectomy device and aspiration catheter were removed under constant aspiration. Follow-up right internal carotid artery angiogram showed complete recanalization of the right ACA vascular tree (TICI 3). Slow flow in distal right MCA branches was noted as well as mild in homogeneous opacification of the mid right M1 segment. The large bore aspiration catheter was then advanced over the phenom 21 microcatheter and a synchro support microguidewire into the right M1 segment. The microcatheter was removed and the aspiration catheter was connected to a penumbra aspiration pump. Continuous aspiration performed for 2 minutes with brisk flow noted. The aspiration catheter was then removed. Follow-up right ICA angiograms with frontal and lateral views of the head showed some residual slow  distal flow, may be related to the presence of guide catheter within the cervical right ICA. An exchange length Glidewire was placed into the distal cervical segment of the right ICA. The guide catheter was then retracted into the right common carotid artery. Frontal and lateral angiograms of the neck were obtained. Revascularization of the right carotid bifurcation confirmed with atherosclerotic changes with calcified plaques noted in the right carotid bulb resulting in approximately 53% stenosis. A small non flow limiting dissection is noted in the mid cervical segment of the right ICA. Follow-up right carotid artery angiogram showed preserved anterograde flow in the right ACA and MCA territory with near complete resolution of the slow flow in the distal right  MCA branches, except for previously noted cortical mid frontal branches. The catheter was subsequently withdrawn. Flat panel CT of the head was obtained and post processed in a separate workstation with concurrent attending physician supervision. Selected images were sent to PACS. A small right sylvian subarachnoid hemorrhage was noted. Right common femoral artery angiograms with frontal and lateral views were obtained. The puncture is at the level of the common femoral artery. The femoral sheath was exchanged over the wire for Perclose ProGlide which was utilized for access closure. Immediate hemostasis was achieved. IMPRESSION: 1. Successful mechanical thrombectomy for treatment of the right M1/MCA occlusion with combined stent retriever and aspiration with final recanalization TICI 2C. 2. Successful mechanical thrombectomy for treatment embolus to right A3/ACA with combined stent retriever and aspiration with final recanalization TICI3. 3. Small right sylvian subarachnoid hemorrhage on postprocedural flat panel CT. PLAN: - Transfer to ICU for continued care. - SBP 120-140- Bed rest 6h post femoral access- Follow-up head CT within 4 hours to assess SAH  stability.- Follow-up as outpatient for symptomatic right carotid stenosis. Electronically Signed   By: Pedro Earls M.D.   On: 01/05/2020 11:24   CT CEREBRAL PERFUSION W CONTRAST 12/31/2019 CLINICAL DATA:  Code stroke.  Left hemiparesis and rightward gaze. EXAM: CT ANGIOGRAPHY HEAD AND NECK CT PERFUSION BRAIN TECHNIQUE: Multidetector CT imaging of the head and neck was performed using the standard protocol during bolus administration of intravenous contrast. Multiplanar CT image reconstructions and MIPs were obtained to evaluate the vascular anatomy. Carotid stenosis measurements (when applicable) are obtained utilizing NASCET criteria, using the distal internal carotid diameter as the denominator. Multiphase CT imaging of the brain was performed following IV bolus contrast injection. Subsequent parametric perfusion maps were calculated using RAPID software. CONTRAST:  123m OMNIPAQUE IOHEXOL 350 MG/ML SOLN COMPARISON:  None. FINDINGS: CTA NECK FINDINGS Aortic arch: Standard 3 vessel aortic arch with minimal atherosclerotic plaque. Widely patent arch vessel origins. Right carotid system: Patent common carotid artery. Extensive soft and calcified plaque at the carotid bifurcation with occlusion of the ICA at its origin and without reconstitution in the neck. Left carotid system: Patent with mild calcified and soft plaque at the carotid bifurcation. No evidence of significant stenosis or dissection. Vertebral arteries: Patent with the left being mildly to moderately dominant. Calcified plaque at the left vertebral artery origin results in mild stenosis. No right-sided stenosis. Skeleton: Mild cervical spondylosis. Other neck: No evidence of cervical lymphadenopathy or mass. Upper chest: Mild centrilobular emphysema. Review of the MIP images confirms the above findings CTA HEAD FINDINGS Anterior circulation: There is intracranial reconstitution of the right ICA with the vessel appearing patent from  the petrous segment through the carotid terminus but with diffusely diminished contrast enhancement and with potential superimposed moderate atherosclerotic narrowing of the cavernous and proximal supraclinoid segments. The left intracranial ICA is patent with at most mild cavernous segment stenosis due to atherosclerotic plaque. The right M1 segment is patent but attenuated proximally and is occluded distally near the bifurcation. There is faint opacification of some right MCA superior division branch vessels without evidence of significant inferior division collateralization on this single phase CTA. The ACAs and left MCA are patent. There are mild right and mild-to-moderate left proximal A1 stenoses, and there is a mild proximal left M1 stenosis. No aneurysm is identified. Posterior circulation: The intracranial vertebral arteries are widely patent to the basilar. A patent right PICA, left AICA, and bilateral SCA origins are visualized. The basilar artery is patent  with mild irregular narrowing proximally. There are right larger than left posterior communicating arteries. Both PCAs are patent with branch vessel irregularity but no evidence of a significant proximal stenosis. No aneurysm is identified. Venous sinuses: As permitted by contrast timing, patent. Anatomic variants: None. Review of the MIP images confirms the above findings CT Brain Perfusion Findings: ASPECTS: 6 CBF (<30%) Volume: 53 mL Perfusion (Tmax>6.0s) volume: 204 mL Mismatch Volume: 151 mL Infarction Location: Right MCA territory IMPRESSION: 1. 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 2. Distal right M1 occlusion. 3. 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 4. Mild left vertebral artery origin stenosis. 5. Aortic Atherosclerosis (ICD10-I70.0) and Emphysema (ICD10-J43.9). These results were communicated to Dr. Rory Percy at 3:32 p.m. on 12/31/2019 by text page via the [facility] messaging system.  Electronically Signed   By: Logan Bores M.D.   On: 12/31/2019 16:02   DG Chest Port 1 View 01/04/2020 CLINICAL DATA:  Hypoxia EXAM: PORTABLE CHEST 1 VIEW COMPARISON:  01/01/2020 FINDINGS: Feeding catheter is now noted extending into the stomach. The lungs are well aerated bilaterally. No focal infiltrate or sizable effusion is seen. Cardiac shadow is stable. No bony abnormality is noted. IMPRESSION: Feeding catheter extending into the stomach. No acute abnormality noted. Electronically Signed   By: Inez Catalina M.D.   On: 01/04/2020 08:32   DG Chest Port 1 View 01/01/2020 CLINICAL DATA:  59 year old female with respiratory distress. EXAM: PORTABLE CHEST 1 VIEW COMPARISON:  Portable chest 12/31/2019 and earlier. FINDINGS: Portable AP semi upright view at 0728 hours. Lung volumes and mediastinal contours remain within normal limits. The patient is mildly rotated to the right. Visualized tracheal air column is within normal limits. Allowing for portable technique the lungs are clear. No pneumothorax or pleural effusion. Negative visible bowel gas and osseous structures. IMPRESSION: Negative portable chest. Electronically Signed   By: Genevie Ann M.D.   On: 01/01/2020 07:54   DG Chest Port 1 View 12/31/2019 CLINICAL DATA:  Stroke EXAM: PORTABLE CHEST 1 VIEW COMPARISON:  04/15/2018 FINDINGS: Right mid lung atelectasis or scarring. Heart is normal size. Left lung clear. No effusions or acute bony abnormality. IMPRESSION: Right mid lung atelectasis or scarring.  No active disease. Electronically Signed   By: Rolm Baptise M.D.   On: 12/31/2019 19:29   DG Swallowing Func-Speech Pathology 01/07/2020 Objective Swallowing Evaluation: Type of Study: MBS-Modified Barium Swallow Study  Patient Details Name: Anne [name] MRN: [id number] Date of Birth: 10/05/1960 Today's Date: 01/07/2020 Time: SLP Start Time (ACUTE ONLY): 1100 -SLP Stop Time (ACUTE ONLY): 1120 SLP Time Calculation (min) (ACUTE ONLY): 20 min Past Medical  History: Past Medical History: Diagnosis Date  Asthma   CAD (coronary artery disease)   a. cardiac cath 02/04/2013: tubular 20% stenosis pRCA, tubular 30% stenosis mRCA, medically managed  Coronary vasospasm (HCC)   a. cardiac cath 11/25/2014: mLCx 30%, pRCA 30%, moderate spasm w/ noted improvement w/ reimaging, recommended CCB, long acting nitrate, smoking cessation, & statin   Hypertension   Polysubstance abuse ([place])   a. remote history of crack/cocaine abuse approximately 8-10 years ago, ongoing tobacco abuse since age 2, rare etoh abuse  Tubular adenoma  Past Surgical History: Past Surgical History: Procedure Laterality Date  CARDIAC CATHETERIZATION N/A 11/25/2014  Procedure: Left Heart Cath and Coronary Angiography;  Surgeon: Minna Merritts, MD;  Location: [facility];  Service: Cardiovascular;  Laterality: N/A;  IR CT HEAD LTD  12/31/2019  IR PERCUTANEOUS  ART THROMBECTOMY/INFUSION INTRACRANIAL INC DIAG ANGIO  12/31/2019     IR PERCUTANEOUS ART THROMBECTOMY/INFUSION INTRACRANIAL INC DIAG ANGIO  12/31/2019  RADIOLOGY WITH ANESTHESIA Left 12/31/2019  Procedure: RADIOLOGY WITH ANESTHESIA;  Surgeon: Radiologist, Medication, MD;  Location: [facility];  Service: Radiology;  Laterality: Left; HPI: Pt is a 59 yo female presenting with L hemiplegia and R gaze preference. CT showed R MCA infarct. CTA revealed occlusion of R ICA. Pt is s/p thrombectomy on 6/17. PMH includes polysubstance abuse, HTN, coronary vasospasms, CAD, asthma. Assessment / Plan / Recommendation CHL IP CLINICAL IMPRESSIONS 01/07/2020 Clinical Impression Pt presents with oropharyngeal dysphagia characterized by impaired A-P transport, impaired bolus cohesion, and a pharyngeal delay. She demonstrated inconsistent lingual pumping, premature spillage to the valleculae and pyriform sinuses, transient penetration (PAS 2) of nectar thick liquids via straw, and inconsistent aspiration (PAS 7, 8) of thin liquids. A 43m barium tablet was administered, but pt  masticated it despite encourgament to swallow it whole. A dysphagia 1 (puree) diet with nectar thick liquids is recommended at this time. Pt may have ice chips following oral care. SLP will follow for dysphagia treatment.  SLP Visit Diagnosis Dysphagia, oropharyngeal phase (R13.12) Attention and concentration deficit following -- Frontal lobe and executive function deficit following -- Impact on safety and function Mild aspiration risk;Moderate aspiration risk   CHL IP TREATMENT RECOMMENDATION 01/07/2020 Treatment Recommendations Therapy as outlined in treatment plan below   Prognosis 01/07/2020 Prognosis for Safe Diet Advancement Good Barriers to Reach Goals Cognitive deficits Barriers/Prognosis Comment -- CHL IP DIET RECOMMENDATION 01/07/2020 SLP Diet Recommendations Dysphagia 1 (Puree) solids;Nectar thick liquid Liquid Administration via Cup;Straw Medication Administration Crushed with puree Compensations Slow rate;Small sips/bites Postural Changes Seated upright at 90 degrees   CHL IP OTHER RECOMMENDATIONS 01/07/2020 Recommended Consults -- Oral Care Recommendations Oral care BID Other Recommendations --   CHL IP FOLLOW UP RECOMMENDATIONS 01/07/2020 Follow up Recommendations Inpatient Rehab   CHL IP FREQUENCY AND DURATION 01/07/2020 Speech Therapy Frequency (ACUTE ONLY) min 2x/week Treatment Duration 2 weeks      CHL IP ORAL PHASE 01/07/2020 Oral Phase Impaired Oral - Pudding Teaspoon -- Oral - Pudding Cup -- Oral - Honey Teaspoon -- Oral - Honey Cup -- Oral - Nectar Teaspoon -- Oral - Nectar Cup Impaired mastication;Lingual pumping;Decreased bolus cohesion;Premature spillage Oral - Nectar Straw Impaired mastication;Lingual pumping;Decreased bolus cohesion;Premature spillage Oral - Thin Teaspoon -- Oral - Thin Cup Impaired mastication;Lingual pumping;Decreased bolus cohesion;Premature spillage Oral - Thin Straw Impaired mastication;Lingual pumping;Decreased bolus cohesion;Premature spillage Oral - Puree Impaired  mastication;Lingual pumping;Decreased bolus cohesion;Premature spillage Oral - Mech Soft Impaired mastication;Lingual pumping;Decreased bolus cohesion;Premature spillage Oral - Regular Impaired mastication;Lingual pumping;Decreased bolus cohesion;Premature spillage Oral - Multi-Consistency -- Oral - Pill Impaired mastication;Lingual pumping;Decreased bolus cohesion;Premature spillage Oral Phase - Comment --  CHL IP PHARYNGEAL PHASE 01/07/2020 Pharyngeal Phase Impaired Pharyngeal- Pudding Teaspoon -- Pharyngeal -- Pharyngeal- Pudding Cup -- Pharyngeal -- Pharyngeal- Honey Teaspoon -- Pharyngeal -- Pharyngeal- Honey Cup -- Pharyngeal -- Pharyngeal- Nectar Teaspoon -- Pharyngeal -- Pharyngeal- Nectar Cup Delayed swallow initiation-vallecula;Delayed swallow initiation-pyriform sinuses Pharyngeal Material enters airway, remains ABOVE vocal cords then ejected out Pharyngeal- Nectar Straw Delayed swallow initiation-vallecula;Delayed swallow initiation-pyriform sinuses;Penetration/Aspiration during swallow Pharyngeal Material enters airway, remains ABOVE vocal cords and not ejected out Pharyngeal- Thin Teaspoon -- Pharyngeal -- Pharyngeal- Thin Cup Delayed swallow initiation-vallecula;Delayed swallow initiation-pyriform sinuses;Penetration/Aspiration during swallow Pharyngeal Material enters airway, passes BELOW cords and not ejected out despite cough attempt by patient;Material enters airway, passes BELOW cords without attempt by  patient to eject out (silent aspiration) Pharyngeal- Thin Straw Delayed swallow initiation-vallecula;Delayed swallow initiation-pyriform sinuses;Penetration/Aspiration during swallow Pharyngeal Material enters airway, passes BELOW cords and not ejected out despite cough attempt by patient;Material enters airway, passes BELOW cords without attempt by patient to eject out (silent aspiration) Pharyngeal- Puree Delayed swallow initiation-vallecula Pharyngeal -- Pharyngeal- Mechanical Soft Delayed  swallow initiation-vallecula Pharyngeal -- Pharyngeal- Regular Delayed swallow initiation-vallecula Pharyngeal -- Pharyngeal- Multi-consistency -- Pharyngeal -- Pharyngeal- Pill Delayed swallow initiation-vallecula;Delayed swallow initiation-pyriform sinuses Pharyngeal -- Pharyngeal Comment --  CHL IP CERVICAL ESOPHAGEAL PHASE 01/07/2020 Cervical Esophageal Phase WFL Pudding Teaspoon -- Pudding Cup -- Honey Teaspoon -- Honey Cup -- Nectar Teaspoon -- Nectar Cup -- Nectar Straw -- Thin Teaspoon -- Thin Cup -- Thin Straw -- Puree -- Mechanical Soft -- Regular -- Multi-consistency -- Pill -- Cervical Esophageal Comment -- Anne [name], [place], [place] Office number [phone] Pager [phone] Horton Marshall 01/07/2020, 1:09 PM              ECHOCARDIOGRAM COMPLETE 01/01/2020    ECHOCARDIOGRAM REPORT   Patient Name:   MKENZIE DOTTS Date of Exam: 01/01/2020 Medical Rec #:  [id number]      Height:       66.0 in Accession #:    [id number]     Weight:       201.5 lb Date of Birth:  [birth year]      BSA:          2.006 m Patient Age:    1 years       BP:           146/129 mmHg Patient Gender: F              HR:           85 bpm. Exam Location:  Inpatient Procedure: 2D Echo, Color Doppler and Cardiac Doppler Indications:    Stroke i163.9  History:        Patient has no prior history of Echocardiogram examinations.                 CAD; Risk Factors:Hypertension.  Sonographer:    Raquel Sarna Senior RDCS Referring Phys: [place]  1. Left ventricular ejection fraction, by estimation, is 65 to 70%. The left ventricle has normal function. The left ventricle has no regional wall motion abnormalities. There is mild concentric left ventricular hypertrophy. Left ventricular diastolic parameters are consistent with Grade II diastolic dysfunction (pseudonormalization). Elevated left ventricular end-diastolic pressure.  2. Right ventricular systolic function is normal. The right  ventricular size is normal. There is normal pulmonary artery systolic pressure.  3. Left atrial size was mildly dilated.  4. The mitral valve is normal in structure. No evidence of mitral valve regurgitation. No evidence of mitral stenosis.  5. The aortic valve is normal in structure. Aortic valve regurgitation is mild. No aortic stenosis is present.  6. The inferior vena cava is normal in size with greater than 50% respiratory variability, suggesting right atrial pressure of 3 mmHg. FINDINGS  Left Ventricle: Left ventricular ejection fraction, by estimation, is 65 to 70%. The left ventricle has normal function. The left ventricle has no regional wall motion abnormalities. The left ventricular internal cavity size was normal in size. There is  mild concentric left ventricular hypertrophy. Left ventricular diastolic parameters are consistent with Grade II diastolic dysfunction (pseudonormalization). Elevated left ventricular end-diastolic pressure. Right Ventricle: The right ventricular size is normal. No increase in right ventricular  wall thickness. Right ventricular systolic function is normal. There is normal pulmonary artery systolic pressure. The tricuspid regurgitant velocity is 2.85 m/s, and  with an assumed right atrial pressure of 3 mmHg, the estimated right ventricular systolic pressure is 12.2 mmHg. Left Atrium: Left atrial size was mildly dilated. Right Atrium: Right atrial size was normal in size. Pericardium: There is no evidence of pericardial effusion. Mitral Valve: The mitral valve is normal in structure. Normal mobility of the mitral valve leaflets. No evidence of mitral valve regurgitation. No evidence of mitral valve stenosis. Tricuspid Valve: The tricuspid valve is normal in structure. Tricuspid valve regurgitation is not demonstrated. No evidence of tricuspid stenosis. Aortic Valve: The aortic valve is normal in structure. Aortic valve regurgitation is mild. Aortic regurgitation PHT measures 308  msec. No aortic stenosis is present. Aortic valve mean gradient measures 8.0 mmHg. Aortic valve peak gradient measures 17.8 mmHg. Aortic valve area, by VTI measures 1.99 cm. Pulmonic Valve: The pulmonic valve was normal in structure. Pulmonic valve regurgitation is not visualized. No evidence of pulmonic stenosis. Aorta: The aortic root is normal in size and structure. Venous: The inferior vena cava is normal in size with greater than 50% respiratory variability, suggesting right atrial pressure of 3 mmHg. IAS/Shunts: No atrial level shunt detected by color flow Doppler.  LEFT VENTRICLE PLAX 2D LVIDd:         3.80 cm  Diastology LVIDs:         2.20 cm  LV e' lateral:   9.57 cm/s LV PW:         1.20 cm  LV E/e' lateral: 10.7 LV IVS:        1.20 cm  LV e' medial:    6.42 cm/s LVOT diam:     1.90 cm  LV E/e' medial:  15.9 LV SV:         79 LV SV Index:   40 LVOT Area:     2.84 cm  LEFT ATRIUM             Index       RIGHT ATRIUM           Index LA diam:        3.90 cm 1.94 cm/m  RA Area:     13.10 cm LA Vol (A2C):   46.7 ml 23.28 ml/m RA Volume:   28.50 ml  14.21 ml/m LA Vol (A4C):   31.5 ml 15.70 ml/m LA Biplane Vol: 38.9 ml 19.39 ml/m  AORTIC VALVE AV Area (Vmax):    1.85 cm AV Area (Vmean):   2.04 cm AV Area (VTI):     1.99 cm AV Vmax:           211.00 cm/s AV Vmean:          132.000 cm/s AV VTI:            0.399 m AV Peak Grad:      17.8 mmHg AV Mean Grad:      8.0 mmHg LVOT Vmax:         138.00 cm/s LVOT Vmean:        95.200 cm/s LVOT VTI:          0.280 m LVOT/AV VTI ratio: 0.70 AI PHT:            308 msec  AORTA Ao Root diam: 2.70 cm Ao Asc diam:  3.00 cm MITRAL VALVE  TRICUSPID VALVE MV Area (PHT): 2.69 cm     TR Peak grad:   32.5 mmHg MV Decel Time: 282 msec     TR Vmax:        285.00 cm/s MV E velocity: 102.00 cm/s MV A velocity: 104.00 cm/s  SHUNTS MV E/A ratio:  0.98         Systemic VTI:  0.28 m                             Systemic Diam: 1.90 cm Dani Gobble Croitoru MD Electronically  signed by Sanda Klein MD Signature Date/Time: 01/01/2020/12:26:52 PM    Final    IR PERCUTANEOUS ART THROMBECTOMY/INFUSION INTRACRANIAL INC DIAG ANGIO 01/05/2020 INDICATION: 59 year old female with past medical history significant for bone substance abuse, hypertension, coronary vasospasms and CAD; baseline modified Rankin scale 0. She was brought to an outside hospital after being found down. At admission, right gaze deviation and left-sided hemiplegia were noted, NIHSS 18. She was last seen well at 9:30 a.m. on 12/31/2019. No intravenous tPA administered as she was outside the window. Head CT showed ongoing right MCA territory infarct with hypodensity in the right insula, basal ganglia, right frontal and temporoparietal regions. CT angiogram of the head and neck showed occlusion of the right internal carotid artery at the bulb with mixed density plaque as well as occlusion of distal right M1/MCA segment. CT perfusion showed a core infarct of 53 mL with an ischemic penumbra of 151 mL. She was taken to our service for emergency mechanical thrombectomy. EXAM: Diagnostic cerebral angiogram and mechanical thrombectomy COMPARISON:  CT/CT angiogram of the head and neck December 31, 2019. MEDICATIONS: No antibiotics administered ANESTHESIA/SEDATION: The procedure was performed under general anesthesia. FLUOROSCOPY TIME:  Fluoroscopy Time: 25 minutes 18 seconds (871 mGy). COMPLICATIONS: None immediate. TECHNIQUE: Informed written consent was obtained from the patient's daughter after a thorough discussion of the procedural risks, benefits and alternatives. All questions were addressed. Maximal Sterile Barrier Technique was utilized including caps, mask, sterile gowns, sterile gloves, sterile drape, hand hygiene and skin antiseptic. A timeout was performed prior to the initiation of the procedure. The right groin was prepped and draped in the usual sterile fashion. Using a micropuncture kit and the modified Seldinger  technique, access was gained to the right common femoral artery and an 8 French sheath was placed. Under fluoroscopy, an 8 [place] Walrus balloon guide catheter was navigated over a 6 [place] Berenstein 2 catheter and a 0.035" Terumo Glidewire into the aortic arch. The catheter was placed into the right common carotid artery. A roadmap was obtained. The Berenstein 2 catheter was then advanced over the wire across the occluded right carotid bulb into the distal cervical segment of the right ICA. The balloon guide catheter was then advanced over the [facility] 2 catheter into the distal cervical right ICA. The Berenstein 2 catheter was removed. No angiogram was obtained given no blood return was seen. FINDINGS: Occlusion of the right internal carotid artery at the bulb with mixed density plaque. PROCEDURE: Under biplane roadmap, a large bore aspiration catheter was navigated over a phenom 21 microcatheter and a synchro support microguidewire into the cavernous segment of the right ICA. The microcatheter was then navigated over the wire into the right M2/MCA posterior division branch. Then, a 6 x 40 mm solitaire stent retriever was deployed spanning the mid to distal M1 and M2 segments. The device was allowed to intercalated with the clot  for 4 minutes. The microcatheter was removed. The aspiration catheter was advanced to the level of occlusion and connected to a penumbra aspiration pump. The guiding catheter balloon was inflated. The thrombectomy device and aspiration catheter were removed under constant aspiration. Right ICA angiogram showed recanalization of the right M1 segment with near complete revascularization of the right MCA vascular tree with the exception of slow flow into M4 branches to the mid frontal region. Embolus to the right A3/ACA segment was noted, likely from right carotid bulb crossing. Under biplane roadmap, a large bore aspiration catheter was navigated over a phenom 21 microcatheter and a  synchro support microguidewire into the cavernous segment of the right ICA. The microcatheter was then navigated over the wire into the right A3/ACA. Then, a 3 mm solitaire stent retriever was deployed spanning the A3 segment. The device was allowed to intercalated with the clot for 4 minutes. The aspiration catheter was advanced to the level of the ICA terminus and connected to a penumbra aspiration pump. The guiding catheter balloon was inflated. The thrombectomy device and aspiration catheter were removed under constant aspiration. Follow-up right internal carotid artery angiogram showed complete recanalization of the right ACA vascular tree (TICI 3). Slow flow in distal right MCA branches was noted as well as mild in homogeneous opacification of the mid right M1 segment. The large bore aspiration catheter was then advanced over the phenom 21 microcatheter and a synchro support microguidewire into the right M1 segment. The microcatheter was removed and the aspiration catheter was connected to a penumbra aspiration pump. Continuous aspiration performed for 2 minutes with brisk flow noted. The aspiration catheter was then removed. Follow-up right ICA angiograms with frontal and lateral views of the head showed some residual slow distal flow, may be related to the presence of guide catheter within the cervical right ICA. An exchange length Glidewire was placed into the distal cervical segment of the right ICA. The guide catheter was then retracted into the right common carotid artery. Frontal and lateral angiograms of the neck were obtained. Revascularization of the right carotid bifurcation confirmed with atherosclerotic changes with calcified plaques noted in the right carotid bulb resulting in approximately 53% stenosis. A small non flow limiting dissection is noted in the mid cervical segment of the right ICA. Follow-up right carotid artery angiogram showed preserved anterograde flow in the right ACA and MCA  territory with near complete resolution of the slow flow in the distal right MCA branches, except for previously noted cortical mid frontal branches. The catheter was subsequently withdrawn. Flat panel CT of the head was obtained and post processed in a separate workstation with concurrent attending physician supervision. Selected images were sent to PACS. A small right sylvian subarachnoid hemorrhage was noted. Right common femoral artery angiograms with frontal and lateral views were obtained. The puncture is at the level of the common femoral artery. The femoral sheath was exchanged over the wire for Perclose ProGlide which was utilized for access closure. Immediate hemostasis was achieved. IMPRESSION: 1. Successful mechanical thrombectomy for treatment of the right M1/MCA occlusion with combined stent retriever and aspiration with final recanalization TICI 2C. 2. Successful mechanical thrombectomy for treatment embolus to right A3/ACA with combined stent retriever and aspiration with final recanalization TICI3. 3. Small right sylvian subarachnoid hemorrhage on postprocedural flat panel CT. PLAN: - Transfer to ICU for continued care. - SBP 120-140- Bed rest 6h post femoral access- Follow-up head CT within 4 hours to assess SAH stability.- Follow-up as outpatient for  symptomatic right carotid stenosis. Electronically Signed   By: Pedro Earls M.D.   On: 01/05/2020 11:24   CT HEAD CODE STROKE WO CONTRAST 12/31/2019 CLINICAL DATA:  Code stroke.  Left hemiparesis and rightward gaze. EXAM: CT HEAD WITHOUT CONTRAST TECHNIQUE: Contiguous axial images were obtained from the base of the skull through the vertex without intravenous contrast. COMPARISON:  None. FINDINGS: Brain: There is hypodensity involving the right insula, right frontal and parietal operculum, and a small amount of the lateral right frontal lobe superior to the operculum. There is a chronic infarct in the left basal ganglia with ex  vacuo dilatation of the left frontal horn. No acute intracranial hemorrhage, midline shift, or extra-axial fluid collection is identified. Vascular: Calcified atherosclerosis at the skull base. Hyperdense right MCA in the sylvian fissure with CTA pending. Skull: No fracture or suspicious osseous lesion. Sinuses/Orbits: Suspected prior right maxillary antrostomy and partial ethmoidectomy. Complete opacification of the right maxillary sinus with prominent sclerosis of the sinus walls and a small amount of soft tissue extending into the right nasal cavity. Clear mastoid air cells. Rightward gaze. Other: None. ASPECTS [facility] Stroke Program Early CT Score) - Ganglionic level infarction (caudate, lentiform nuclei, internal capsule, insula, M1-M3 cortex): 4 - Supraganglionic infarction (M4-M6 cortex): 2 Total score (0-10 with 10 being normal): 6 IMPRESSION: 1. Acute nonhemorrhagic right MCA infarct. 2. ASPECTS is 6. 3. Chronic left basal ganglia infarct. These results were called by telephone at the time of interpretation on 12/31/2019 at 3:12 p.m. to Dr. Rory Percy, who verbally acknowledged these results. Electronically Signed   By: Logan Bores M.D.   On: 12/31/2019 15:47   VAS US CAROTID 01/04/2020 Carotid Arterial Duplex Study Indications:       CVA and evaluate right ICA stenosis post angrioplasty. Risk Factors:      Hypertension, coronary artery disease. Limitations        Today's exam was limited due to patient positioning. Comparison Study:  no prior Performing Technologist: Abram Sander RVS  Examination Guidelines: A complete evaluation includes B-mode imaging, spectral Doppler, color Doppler, and power Doppler as needed of all accessible portions of each vessel. Bilateral testing is considered an integral part of a complete examination. Limited examinations for reoccurring indications may be performed as noted.  Right Carotid Findings: +----------+--------+--------+--------+------------------+--------------+            PSV cm/sEDV cm/sStenosisPlaque DescriptionComments       +----------+--------+--------+--------+------------------+--------------+ ICA Prox  576     241     80-99%  calcific                         +----------+--------+--------+--------+------------------+--------------+ ICA Mid   60      16                                               +----------+--------+--------+--------+------------------+--------------+ ICA Distal                                          not visualized +----------+--------+--------+--------+------------------+--------------+ ECA       408     20                                               +----------+--------+--------+--------+------------------+--------------+ +----------+--------+-------+--------------+-------------------+  PSV cm/sEDV cmsDescribe      Arm Pressure (mmHG) +----------+--------+-------+--------------+-------------------+ Subclavian               Not identified                    +----------+--------+-------+--------------+-------------------+ +---------+--------+--------+--------------+ VertebralPSV cm/sEDV cm/sNot identified +---------+--------+--------+--------------+  Left Carotid Findings: +----------+--------+--------+--------+------------------+--------+           PSV cm/sEDV cm/sStenosisPlaque DescriptionComments +----------+--------+--------+--------+------------------+--------+ CCA Prox  170     21              heterogenous               +----------+--------+--------+--------+------------------+--------+ CCA Distal141     26              heterogenous               +----------+--------+--------+--------+------------------+--------+ ICA Prox  151     28      1-39%   heterogenous               +----------+--------+--------+--------+------------------+--------+ ICA Distal106     47                                          +----------+--------+--------+--------+------------------+--------+ ECA       209     16                                         +----------+--------+--------+--------+------------------+--------+ +----------+--------+--------+--------+-------------------+           PSV cm/sEDV cm/sDescribeArm Pressure (mmHG) +----------+--------+--------+--------+-------------------+ [id number]                                         +----------+--------+--------+--------+-------------------+ +---------+--------+--------+--------------+ VertebralPSV cm/sEDV cm/sNot identified +---------+--------+--------+--------------+   Summary: Right Carotid: Velocities in the right ICA are consistent with a 80-99%                stenosis. Left Carotid: Velocities in the left ICA are consistent with a 1-39% stenosis. Vertebrals: Bilateral vertebral arteries were not visualized. *See table(s) above for measurements and observations.  Electronically signed by Antony Contras MD on 01/04/2020 at 1:32:58 PM.    Final    CT ANGIO HEAD CODE STROKE 12/31/2019 CLINICAL DATA:  Code stroke.  Left hemiparesis and rightward gaze. EXAM: CT ANGIOGRAPHY HEAD AND NECK CT PERFUSION BRAIN TECHNIQUE: Multidetector CT imaging of the head and neck was performed using the standard protocol during bolus administration of intravenous contrast. Multiplanar CT image reconstructions and MIPs were obtained to evaluate the vascular anatomy. Carotid stenosis measurements (when applicable) are obtained utilizing NASCET criteria, using the distal internal carotid diameter as the denominator. Multiphase CT imaging of the brain was performed following IV bolus contrast injection. Subsequent parametric perfusion maps were calculated using RAPID software. CONTRAST:  165m OMNIPAQUE IOHEXOL 350 MG/ML SOLN COMPARISON:  None. FINDINGS: CTA NECK FINDINGS Aortic arch: Standard 3 vessel aortic arch with minimal atherosclerotic plaque. Widely patent arch vessel  origins. Right carotid system: Patent common carotid artery. Extensive soft and calcified plaque at the carotid bifurcation with occlusion of the ICA at its origin and without reconstitution in the neck. Left carotid system: Patent with mild calcified and soft plaque  at the carotid bifurcation. No evidence of significant stenosis or dissection. Vertebral arteries: Patent with the left being mildly to moderately dominant. Calcified plaque at the left vertebral artery origin results in mild stenosis. No right-sided stenosis. Skeleton: Mild cervical spondylosis. Other neck: No evidence of cervical lymphadenopathy or mass. Upper chest: Mild centrilobular emphysema. Review of the MIP images confirms the above findings CTA HEAD FINDINGS Anterior circulation: There is intracranial reconstitution of the right ICA with the vessel appearing patent from the petrous segment through the carotid terminus but with diffusely diminished contrast enhancement and with potential superimposed moderate atherosclerotic narrowing of the cavernous and proximal supraclinoid segments. The left intracranial ICA is patent with at most mild cavernous segment stenosis due to atherosclerotic plaque. The right M1 segment is patent but attenuated proximally and is occluded distally near the bifurcation. There is faint opacification of some right MCA superior division branch vessels without evidence of significant inferior division collateralization on this single phase CTA. The ACAs and left MCA are patent. There are mild right and mild-to-moderate left proximal A1 stenoses, and there is a mild proximal left M1 stenosis. No aneurysm is identified. Posterior circulation: The intracranial vertebral arteries are widely patent to the basilar. A patent right PICA, left AICA, and bilateral SCA origins are visualized. The basilar artery is patent with mild irregular narrowing proximally. There are right larger than left posterior communicating arteries.  Both PCAs are patent with branch vessel irregularity but no evidence of a significant proximal stenosis. No aneurysm is identified. Venous sinuses: As permitted by contrast timing, patent. Anatomic variants: None. Review of the MIP images confirms the above findings CT Brain Perfusion Findings: ASPECTS: 6 CBF (<30%) Volume: 53 mL Perfusion (Tmax>6.0s) volume: 204 mL Mismatch Volume: 151 mL Infarction Location: Right MCA territory IMPRESSION: 1. 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 2. Distal right M1 occlusion. 3. 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 4. Mild left vertebral artery origin stenosis. 5. Aortic Atherosclerosis (ICD10-I70.0) and Emphysema (ICD10-J43.9). These results were communicated to Dr. Rory Percy at 3:32 p.m. on 12/31/2019 by text page via the [facility] messaging system. Electronically Signed   By: Logan Bores M.D.   On: 12/31/2019 16:02   CT ANGIO NECK CODE STROKE 12/31/2019 CLINICAL DATA:  Code stroke.  Left hemiparesis and rightward gaze. EXAM: CT ANGIOGRAPHY HEAD AND NECK CT PERFUSION BRAIN TECHNIQUE: Multidetector CT imaging of the head and neck was performed using the standard protocol during bolus administration of intravenous contrast. Multiplanar CT image reconstructions and MIPs were obtained to evaluate the vascular anatomy. Carotid stenosis measurements (when applicable) are obtained utilizing NASCET criteria, using the distal internal carotid diameter as the denominator. Multiphase CT imaging of the brain was performed following IV bolus contrast injection. Subsequent parametric perfusion maps were calculated using RAPID software. CONTRAST:  176m OMNIPAQUE IOHEXOL 350 MG/ML SOLN COMPARISON:  None. FINDINGS: CTA NECK FINDINGS Aortic arch: Standard 3 vessel aortic arch with minimal atherosclerotic plaque. Widely patent arch vessel origins. Right carotid system: Patent common carotid artery. Extensive soft and calcified plaque at the  carotid bifurcation with occlusion of the ICA at its origin and without reconstitution in the neck. Left carotid system: Patent with mild calcified and soft plaque at the carotid bifurcation. No evidence of significant stenosis or dissection. Vertebral arteries: Patent with the left being mildly to moderately dominant. Calcified plaque at the left vertebral artery origin results in mild stenosis. No right-sided stenosis. Skeleton: Mild cervical spondylosis.  Other neck: No evidence of cervical lymphadenopathy or mass. Upper chest: Mild centrilobular emphysema. Review of the MIP images confirms the above findings CTA HEAD FINDINGS Anterior circulation: There is intracranial reconstitution of the right ICA with the vessel appearing patent from the petrous segment through the carotid terminus but with diffusely diminished contrast enhancement and with potential superimposed moderate atherosclerotic narrowing of the cavernous and proximal supraclinoid segments. The left intracranial ICA is patent with at most mild cavernous segment stenosis due to atherosclerotic plaque. The right M1 segment is patent but attenuated proximally and is occluded distally near the bifurcation. There is faint opacification of some right MCA superior division branch vessels without evidence of significant inferior division collateralization on this single phase CTA. The ACAs and left MCA are patent. There are mild right and mild-to-moderate left proximal A1 stenoses, and there is a mild proximal left M1 stenosis. No aneurysm is identified. Posterior circulation: The intracranial vertebral arteries are widely patent to the basilar. A patent right PICA, left AICA, and bilateral SCA origins are visualized. The basilar artery is patent with mild irregular narrowing proximally. There are right larger than left posterior communicating arteries. Both PCAs are patent with branch vessel irregularity but no evidence of a significant proximal stenosis.  No aneurysm is identified. Venous sinuses: As permitted by contrast timing, patent. Anatomic variants: None. Review of the MIP images confirms the above findings CT Brain Perfusion Findings: ASPECTS: 6 CBF (<30%) Volume: 53 mL Perfusion (Tmax>6.0s) volume: 204 mL Mismatch Volume: 151 mL Infarction Location: Right MCA territory IMPRESSION: 1. 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 2. Distal right M1 occlusion. 3. 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 4. Mild left vertebral artery origin stenosis. 5. Aortic Atherosclerosis (ICD10-I70.0) and Emphysema (ICD10-J43.9). These results were communicated to Dr. Rory Percy at 3:32 p.m. on 12/31/2019 by text page via the [facility] messaging system. Electronically Signed   By: Logan Bores M.D.   On: 12/31/2019 16:02    HISTORY OF PRESENT ILLNESS (From Dr Johny Chess H&P 12/31/19) Anne [name] is a 59 y.o. female with history of polysubstance abuse-crack/cocaine, hypertension, coronary vasospasms and CAD.  Patient was brought to hospital as code stroke.  Per daughter she was last seen normal at 9:30 AM.  Between that time and when EMS arrived at scene is unknown when patient had stroke.  Per daughter the niece was home but patient did not come out of room.  Patient was found in the bathroom with the tub full and on the floor.  Patient was immediately brought to [facility] as code stroke from [place].  On arrival patient had a right gaze deviation and left-sided hemiplegia.  Initial CT head did reveal progressing stroke.  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  Patient was immediately brought back to IR for thrombectomy. In discussing with the daughter, patient is fully functional at home, she has been having headaches for 2 weeks and has seen a physician however it is unknown what physician.  Daughter  believes she has been taking aspirin for her headaches. Daughter is Nashaly Dorantes and number is [phone] LKW: 9:30 AM on 12/31/2019 tpa given?: no, out of window Premorbid modified Rankin scale (mRS): 0 NIH stroke scale: [place] [name] is a 59 y.o. female with history of polysubstance abuse-crack/cocaine, tobacco use, hypertension, coronary vasospasms, CAD  and recent headaches  found on the bathroom floor with right gaze deviation and left-sided hemiplegia.  She did not receive IV t-PA due to late presentation (>4.5 hours from time of onset).   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 ASPECTS is 6. Chronic left basal ganglia infarct.  CTA H&N - Occlusion of the right ICA at its origin in the neck with intracranial reconstitution. Distal right M1 occlusion. Mild left vertebral artery origin stenosis.  CT Perfusion - Right MCA core infarct with penumbra IR s/p thrombectomy with TICI 3 reperfusion and R ICA angioplasty  MRI head - Evolving moderate sized acute ischemic right MCA territory infarct as above. No associated hemorrhage or mass effect. Underlying chronic bilateral basal ganglia lacunar infarcts, with additional small chronic left parietal infarct. MRA head - Interval revascularization of previously occluded right ICA and MCA status post catheter directed thrombectomy. Underlying short-segment 3 mm severe mid right M1 stenosis.  Carotid Doppler - R ICA 80-99% stenosis 2D Echo EF 65 to 70% [employer] Virus 2 - negative LDL - 88 HgbA1c - 5.5 UDS neg VTE prophylaxis - lovenox aspirin 81 mg daily prior to admission, now on ASA 325 and plavix 75 DAPT due to severe right ICA stenosis.  Therapy recommendations:  CIR Disposition: [facility]  Evaluated for transfer to [facility] - they reviewed her case and feel these  is nothing additional medically to offer her. They recommend transfer to rehab facility. They are available should she worsen.   Right ICA stenosis CTA head and neck showed right ICA occlusion at origin Status post right ICA angioplasty Carotid Doppler post procedure right ICA 80 to 99% stenosis On DAPT Will need to discuss with Dr. Katherina Right Rodriguze regarding right ICA stenting once pt stabilized from current stroke   Respiratory distress, resolved Overnight desaturation On 10 L nasal cannula CCM on board Aggressive suctioning and nebulization Much improved CXR x2 negative Close monitoring   Hypertension Home BP meds: Norvasc ; Cardizem ; Lisinopril ; Imdur Long-term BP goal normotensive   Hyperlipidemia Home Lipid lowering medication: none  LDL 88, goal < 70 Current lipid lowering medication:  Lipitor 40 mg daily  Continue statin at discharge   Dysphagia Secondary to stroke NPO Has Cortrak and TF For MBSS tomorrow Speech on board   Tobacco abuse Current smoker Smoking cessation counseling will be provided   Other Stroke Risk Factors Advanced age ETOH use, advised to drink no more than 1 alcoholic beverage per day. Obesity, Body mass index is 32.52 kg/m., recommend weight loss, diet and exercise as appropriate  Coronary artery disease Previous strokes by imaging   Other Active Problems Aortic Atherosclerosis  Emphysema  Leukocytosis - resolved UTI - Rocephin started 6/19 x 7 days (UA c/w UTI but no culture performed) pt improving  Occasional tachycardia - monitor  RN Pressure Injury Documentation:     Malnutrition Type:  Nutrition Problem: Inadequate oral intake Etiology: lethargy/confusion   Malnutrition Characteristics:  Signs/Symptoms: NPO status   Nutrition Interventions:  Interventions: Prostat, Tube feeding  DISCHARGE EXAM Vitals:   01/07/20 2314 01/08/20 0027 01/08/20 0324 01/08/20 0754  BP: (!) 162/78 (!) 146/73 (!) 148/77 (!)  141/71  Pulse: (!) 116 (!) 101 100 (!) 101  Resp: _0 Temp: 98.2 F (36.8 C) 98.2 F (36.8 C) 98.6 F (  37 C)   TempSrc: Oral Oral Axillary   SpO2: 96% 98% 93% 93%  Weight:      Height:        General - Well nourished, well developed, middle-aged African-American lady lethargic and drowsy   Ophthalmologic - fundi not visualized due to noncooperation.   Cardiovascular - Regular rhythm and rate.   Neuro -awake and   interactive  . Oriented to place, self and age, but not to time. Severe dysarthria. Paucity of speech, not cooperative with naming or repeating, but following simple commands. Right gaze preference, not cross midline. Blinking to visual threat on the right consistently, but not consistent on the left. PERRL. Left facial droop. Tongue midline. RUE and RLE spontaenous movement and against gravity 3/5. However, LUE 3/5 with pain and LLE 1/5 withdraw with pain.  Sensation and coordination not cooperative and gait not tested.    Discharge Diet   Diet Order             DIET - DYS 1 Room service appropriate? Yes; Fluid consistency: Nectar Thick  Diet effective now                  liquids  DISCHARGE PLAN Disposition:  Discharge to [facility] for ongoing PT, OT and ST aspirin 325 mg daily and clopidogrel 75 mg daily for secondary stroke prevention. Recommend ongoing risk factor control by Primary Care Physician at time of discharge from inpatient rehabilitation. Follow-up Aycock, Ngwe A, MD in 2 weeks following discharge from rehab. Follow-up in [facility] in 4 weeks following discharge from rehab, office to schedule an appointment.   40 minutes were spent preparing discharge.  Mikey Bussing PA-C [facility] Pager [phone] 01/08/2020, 3:59 PM  I have personally obtained history,examined this patient, reviewed notes, independently viewed imaging studies, participated in medical decision  making and plan of care.ROS completed by me personally and pertinent positives fully documented  I have made any additions or clarifications directly to the above note. Agree with note above.    Antony Contras, MD Medical Director [facility] Pager: [phone] 01/09/2020 10:23 AM

## 2020-01-08 NOTE — Progress Notes (Signed)
Nutrition Follow-up  DOCUMENTATION CODES:   Obesity unspecified  INTERVENTION:  Calorie count over the weekend, RD will follow-up with results on Monday  Vital Cuisine shake po TID, each supplement provides 500 kcal and 22 grams of protein  Magic cup TID with meals, each supplement provides 290 kcal and 9 grams of protein  MVI daily  Transition pt to Nocturnal tube feeding via Cortrak:  -Osmolite 1.5 cal @ 48ml/hr for 12 hours from 6p-6a -56ml Pro-stat BID -free water per MD  Nocturnal tube feeding will provide 1370 kcals (meets 76% of minimum needs), 78 grams of protein, 542ml free water   Would not recommend discontinuing Cortrak until pt can consistently meet >75% of estimated needs PO.  NUTRITION DIAGNOSIS:   Inadequate oral intake related to lethargy/confusion as evidenced by NPO status.  Progressing, pt now on Dysphagia 1 diet with Nectar thick liquids  GOAL:   Patient will meet greater than or equal to 90% of their needs  Progressing.  MONITOR:   Diet advancement, Labs, Weight trends, TF tolerance  REASON FOR ASSESSMENT:   Consult Enteral/tube feeding initiation and management  ASSESSMENT:   59 year old female who presented on 6/17 with acute stroke. CT head showed evolving stroke, CTA head and neck showed occlusion of the right internal carotid artery and MCA. PMH of polysubstance abuse, HTN, CAD, coronary vasospasms.  6/17 - s/p thrombectomy by IR 6/18 - Cortrak placement (gastric) 6/24 - s/p MBS, D1/Nectar thick  Pt to transfer to CIR today.  RD will transition pt to nocturnal tube feeding and order calorie count and supplements. Would not recommend discontinuing Cortrak until pt can consistently meet >75% estimated needs po.   PO Intake: 35-45% x 2 recorded meals   TF: Osmolite 1.5 cal @ 61ml/hr, 60ml Pro-stat BID, 66ml free water Q6H (per MD)  Labs reviewed. Medications: Folvite, Thiamine  UOP: 129ml x24 hours I/O: +4,382ml since  admit   Diet Order:   Diet Order            DIET - DYS 1 Room service appropriate? Yes; Fluid consistency: Nectar Thick  Diet effective now                 EDUCATION NEEDS:   No education needs have been identified at this time  Skin:  Skin Assessment: Skin Integrity Issues: Skin Integrity Issues:: Incisions Incisions: groin  Last BM:  6/23  Height:   Ht Readings from Last 1 Encounters:  12/31/19 5\' 6"  (1.676 m)    Weight:   Wt Readings from Last 1 Encounters:  12/31/19 91.4 kg    Ideal Body Weight:  59.1 kg  BMI:  Body mass index is 32.52 kg/m.  Estimated Nutritional Needs:   Kcal:  1800-2000  Protein:  95-110 grams  Fluid:  >/= 1.8 L    Larkin Ina, MS, RD, LDN RD pager number and weekend/on-call pager number located in [place].

## 2020-01-08 NOTE — Progress Notes (Signed)
STROKE TEAM PROGRESS NOTE   INTERVAL HISTORY Patient is sitting up in bed.  Her daughter is at the bedside.  She has been eating about 60 to 70% of her food.  She has a core track tube which will can be discontinued soon if her eating improves.  Neuro exam unchanged.  Vital signs stable.  VE Vitals:   01/07/20 2314 01/08/20 0027 01/08/20 0324 01/08/20 0754  BP: (!) 162/78 (!) 146/73 (!) 148/77 (!) 141/71  Pulse: (!) 116 (!) 101 100 (!) 101  Resp: 16 20 16 20   Temp: 98.[name] F (36.8 C) 98.[name] F (36.8 C) 98.6 F (37 C)   TempSrc: Oral Oral Axillary   SpO2: 96% 98% 93% 93%  Weight:      Height:        CBC:  Recent Labs  Lab 01/05/20 0612 01/06/20 0342  WBC 9.5 9.[name]  HGB 12.9 13.3  HCT 41.1 42.5  MCV 88.[name] 89.7  PLT 257 010    Basic Metabolic Panel:  Recent Labs  Lab 01/03/20 0346 01/03/20 0346 01/04/20 0444 01/05/20 0612  NA 143  --  145  --   K 4.3  --  4.[name]  --   CL 111  --  114*  --   CO2 24  --  25  --   GLUCOSE 101*  --  118*  --   BUN 16  --  20  --   CREATININE 0.77  --  0.77  --   CALCIUM 9.[name]  --  9.8  --   MG [name].1  --  [name].[name]  --   PHOS 3.1   < > 4.[name] 4.1   < > = values in this interval not displayed.    Lipid Panel:     Component Value Date/Time   CHOL 184 01/01/2020 0559   TRIG 103 01/02/2020 0626   HDL 38 (L) 01/01/2020 0559   CHOLHDL 4.8 01/01/2020 0559   VLDL 58 (H) 01/01/2020 0559   LDLCALC 88 01/01/2020 0559   HgbA1c:  Lab Results  Component Value Date   HGBA1C 5.5 01/01/2020   Urine Drug Screen:     Component Value Date/Time   LABOPIA NONE DETECTED 01/01/2020 1230   COCAINSCRNUR NONE DETECTED 01/01/2020 1230   LABBENZ NONE DETECTED 01/01/2020 1230   AMPHETMU NONE DETECTED 01/01/2020 1230   THCU NONE DETECTED 01/01/2020 1230   LABBARB NONE DETECTED 01/01/2020 1230    Alcohol Level No results found for: ETH  IMAGING past 24h No results found.   PHYSICAL EXAM      General - Well nourished, well developed, middle-aged African-American  lady lethargic and drowsy  Ophthalmologic - fundi not visualized due to noncooperation.  Cardiovascular - Regular rhythm and rate.  Neuro -awake and   interactive  . Oriented to place, self and age, but not to time. Severe dysarthria. Paucity of speech, not cooperative with naming or repeating, but following simple commands. Right gaze preference, not cross midline. Blinking to visual threat on the right consistently, but not consistent on the left. PERRL. Left facial droop. Tongue midline. RUE and RLE spontaenous movement and against gravity 3/5. However, LUE 3/5 with pain and LLE 1/5 withdraw with pain.  Sensation and coordination not cooperative and gait not tested.    ASSESSMENT/PLAN [name] is a 59 y.o. female with history of polysubstance abuse-crack/cocaine, tobacco use, hypertension, coronary vasospasms, CAD and recent headaches  found on the bathroom floor with right gaze deviation and left-sided hemiplegia.  She did not receive IV t-PA due to late presentation (>4.5 hours from time of onset).  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 ASPECTS is 6. Chronic left basal ganglia infarct.   CTA H&N - Occlusion of the right ICA at its origin in the neck with intracranial reconstitution. Distal right M1 occlusion. Mild left vertebral artery origin stenosis.   CT Perfusion - Right MCA core infarct with penumbra  IR s/p thrombectomy with TICI 3 reperfusion and R ICA angioplasty   MRI head - Evolving moderate sized acute ischemic right MCA territory infarct as above. No associated hemorrhage or mass effect. Underlying chronic bilateral basal ganglia lacunar infarcts, with additional small chronic left parietal infarct.  MRA head - Interval revascularization of previously occluded right ICA and MCA status post catheter directed  thrombectomy. Underlying short-segment 3 mm severe mid right M1 stenosis.   Carotid Doppler - R ICA 80-99% stenosis  2D Echo EF 65 to 70%  Anne [name] - negative  LDL - 88  HgbA1c - 5.5  UDS neg  VTE prophylaxis - lovenox  aspirin 81 mg daily prior to admission, now on ASA 325 and plavix 75 DAPT due to severe right ICA stenosis.   Therapy recommendations:  CIR  Disposition:  Pending - [facility] CIR consult placed. [facility] contacted to evaluate.  Evaluated for transfer to [facility] - they reviewed her case and feel these is nothing additional medically to offer her. They recommend transfer to rehab facility. They are available should she worsen.  Right ICA stenosis  CTA head and neck showed right ICA occlusion at origin  Status post right ICA angioplasty  Carotid Doppler post procedure right ICA 80 to 99% stenosis  On DAPT  Will need to discuss with Dr. Katherina Right Rodriguze regarding right ICA stenting once pt stabilized from current stroke  Respiratory distress, resolved  Overnight desaturation  On 10 L nasal cannula  CCM on board  Aggressive suctioning and nebulization  Much improved  CXR x2 negative  Close monitoring  Hypertension  Home BP meds: Norvasc ; Cardizem ; Lisinopril ; Imdur . Long-term BP goal normotensive  Hyperlipidemia  Home Lipid lowering medication: none   LDL 88, goal < 70  Current lipid lowering medication:  Lipitor 40 mg daily   Continue statin at discharge  Dysphagia . Secondary to stroke . NPO . Has Cortrak and TF . For MBSS tomorrow . Speech on board   Tobacco abuse  Current smoker  Smoking cessation counseling will be provided  Other Stroke Risk Factors  Advanced age  ETOH use, advised to drink no more than 1 alcoholic beverage per day.  Obesity, Body mass index is 32.52 kg/m., recommend weight loss, diet and exercise as appropriate   Coronary artery disease  Previous strokes by imaging  Other Active  Problems  Aortic Atherosclerosis   Emphysema   Leukocytosis - resolved  UTI - Rocephin started 6/19 x 7 days (UA c/w UTI but no culture performed) pt improving  Occasional tachycardia - monitor  Hospital day # 8  patient counseled to eat as much as possible.  We will keep the core track tube for today and if she can eat adequately will discontinue it tomorrow.  Hopefully transfer to inpatient rehab over the next few days after insurance approval and bed availability discussed  with patient and daughter .    Antony Contras, MD  To contact Stroke Continuity provider, please refer to [email or web address] After hours, contact General Neurology

## 2020-01-08 NOTE — Progress Notes (Signed)
Inpatient Rehab Admissions Coordinator:   Received approval from Stroke team for pt to admit to CIR today.  Will let pt/family and TOC team know.   Shann Medal, PT, DPT Admissions Coordinator [phone] 01/08/20  1:04 PM

## 2020-01-08 NOTE — Progress Notes (Signed)
Patient arrived to unit via bed A/O x4. Patient was oriented to unit and to room. Denies pains upon arrival. Reviewed patient safety policy, and visitor's policy. Will continue to monitor. Amanda Cockayne, LPN

## 2020-01-08 NOTE — Progress Notes (Signed)
Inpatient Rehab Admissions Coordinator:   Spoke with pt's daughter.  She is in agreement for CIR at [facility].  I have a bed available for pt to transfer today, pending approval from attending.   Shann Medal, PT, DPT Admissions Coordinator [phone] 01/08/20  11:28 AM

## 2020-01-08 NOTE — Progress Notes (Signed)
Orthopedic Tech Progress Note Patient Details:  Anne [name] [birth year] [id number] Called in order to [facility] for a REHAB COMBINATION (WHO.[email or web address]. PRAFO) Patient ID: Anne [name], female   DOB: 19-Jul-1960, 59 y.o.   MRN: [id number]   Janit Pagan 01/08/2020, 5:15 PM

## 2020-01-08 NOTE — TOC Transition Note (Signed)
Transition of Care [facility]) - CM/SW Discharge Note   Patient Details  Name: Anne [name] MRN: [id number] Date of Birth: 09-14-1960  [facility]) CM/SW Contact:  Pollie Friar, RN Phone Number: 01/08/2020, 2:17 PM   Clinical Narrative:    Pt discharging to CIR today. CM signing off.    Final next level of care: IP Rehab Facility Barriers to Discharge: No Barriers Identified   Patient Goals and CMS Choice        Discharge Placement                       Discharge Plan and Services In-house Referral: Clinical Social Work Discharge Planning Services: CM Consult                                 Social Determinants of Health (SDOH) Interventions     Readmission Risk Interventions No flowsheet data found.
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
## 2020-01-08 NOTE — Progress Notes (Signed)
PMR Admission Coordinator Pre-Admission Assessment   Patient: Anne [name] is an 59 y.o., female MRN: [id number] DOB: Sep 16, 1960 Height: 5' 6" (167.6 cm) Weight: 91.4 kg                                                                                                                                                  Insurance Information HMO:     PPO:      PCP:      IPA:      80/20:      OTHER:  PRIMARY: Medicaid [place] [employer]      Policy#: [id number] p      Subscriber: pt CM Name:       Phone#:      Fax#:  Coverage Code: MAF-CN      Employer:  Benefits:  Phone #:      Name:  Eff. Date: 01/08/2020     Deduct:       Out of Pocket Max:       Life Max:   CIR:       SNF:  Outpatient:      Co-Pay:  Home Health:       Co-Pay:  DME:      Co-Pay:  Providers:   SECONDARY:       Policy#:       Phone#:    Development worker, community:       Phone#:    The Engineer, petroleum" for patients in Inpatient Rehabilitation Facilities with attached "Privacy Act [place] Records" was provided and verbally reviewed with: N/A   Emergency Contact Information Contact Information       Name Relation Home Work Mobile    Kreisler,[name] Daughter [phone]        Catalina Lunger [phone]             Current Medical History  Patient Admitting Diagnosis: R MCA CVA History of Present Illness: Anne [name] is a 59 y.o. female with history of CAD, coronary vasospasms, COPD, polysubstance abuse in the past, who was admitted on 12/31/2019 after found in a tub full of water with left hemiparesis and right gaze deviation.  History taken from chart review and daughter due to cognition.  UDS negative. CTA/perfusion cerebrum showed occlusion of right ICA at origin in the neck, distal right M1 occlusion and R-MCA core infract with 53 ml penumbra. She underwent cerebral angio with mechanical thrombectomy and complete recanalization of R-MCA and ACA and small to moderate SAH seen in right  sylvian fissure post procedure. MRI brain personally reviewed, limited, but showing moderate/large right MCA infarct.  Per report and MRI, evolving moderate to large sized acute R-MCA infarct with interval revascularization of prior occluded R-ICA and MCA and focal severe stenosis of mid right M1 segment.  Hospital course complicated by respiratory distress on 01/01/2020 due to oral secretions  and required suctioning as well as supplemental oxygen.  Echocardiogram with ejection fraction 65-70% with mild concentric LVH and mild AR. Carotid dopplers showed 80-99% R-ICA stenosis.   She developed fevers with leucocytosis and was started on Ceftriaxone due to concerns of UTI.  Cortak placed for nutritional support due to ongoing lethargy and concerns of aspiration with poststroke dysphagia. Stroke secondary to large vessel disease and patient on ASA 325/Plavix 75 due to severe R-ICA stenosis.  Lethargy resolving but patient continues to have limitations due to Right hemiplegia with right gaze preference and inability to track beyond midline to the left, dysphagia--NPO and minimal verbal output. CIR recommended due to functional deficits.    Complete NIHSS TOTAL: 16   Past Medical History      Past Medical History:  Diagnosis Date  . Asthma    . CAD (coronary artery disease)      a. cardiac cath 02/04/2013: tubular 20% stenosis pRCA, tubular 30% stenosis mRCA, medically managed  . Coronary vasospasm (HCC)      a. cardiac cath 11/25/2014: mLCx 30%, pRCA 30%, moderate spasm w/ noted improvement w/ reimaging, recommended CCB, long acting nitrate, smoking cessation, & statin   . Hypertension    . Polysubstance abuse ([place])      a. remote history of crack/cocaine abuse approximately 8-10 years ago, ongoing tobacco abuse since age 39, rare etoh abuse  . Tubular adenoma        Family History  family history includes Breast cancer (age of onset: 74) in her mother; CAD (age of onset: 70) in her father.   Prior  Rehab/Hospitalizations:  Has the patient had prior rehab or hospitalizations prior to admission? No   Has the patient had major surgery during 100 days prior to admission? Yes   Current Medications    Current Facility-Administered Medications:  .  0.9 %  sodium chloride infusion, 250 mL, Intravenous, Continuous, Amie Portland, MD, Last Rate: 10 mL/hr at 01/04/20 1800, Rate Verify at 01/04/20 1800 .  acetaminophen (TYLENOL) tablet 650 mg, 650 mg, Oral, Q4H PRN, 650 mg at 01/04/20 1704 **OR** acetaminophen (TYLENOL) 160 MG/5ML solution 650 mg, 650 mg, Per Tube, Q4H PRN, 650 mg at 01/07/20 0635 **OR** acetaminophen (TYLENOL) suppository 650 mg, 650 mg, Rectal, Q4H PRN, Marliss Coots, PA-C .  albuterol (PROVENTIL) (2.5 MG/3ML) 0.083% nebulizer solution 2.5 mg, 2.5 mg, Nebulization, Q4H PRN, Hayden Pedro M, NP .  aspirin tablet 325 mg, 325 mg, Per Tube, Daily, Rosalin Hawking, MD, 325 mg at 01/08/20 1010 .  atorvastatin (LIPITOR) tablet 40 mg, 40 mg, Per Tube, Daily, Rosalin Hawking, MD, 40 mg at 01/08/20 1010 .  budesonide (PULMICORT) nebulizer solution 0.25 mg, 0.25 mg, Nebulization, BID, Ramaswamy, Murali, MD, 0.25 mg at 01/07/20 2015 .  Chlorhexidine Gluconate Cloth 2 % PADS 6 each, 6 each, Topical, Q0600, Amie Portland, MD, 6 each at 01/08/20 0543 .  clopidogrel (PLAVIX) tablet 75 mg, 75 mg, Per Tube, Daily, Rosalin Hawking, MD, 75 mg at 01/08/20 1010 .  enoxaparin (LOVENOX) injection 40 mg, 40 mg, Subcutaneous, Q24H, Rosalin Hawking, MD, 40 mg at 01/07/20 2135 .  feeding supplement (OSMOLITE 1.5 CAL) liquid 1,000 mL, 1,000 mL, Per Tube, Continuous, Rosalin Hawking, MD, Last Rate: 50 mL/hr at 01/08/20 0839, 1,000 mL at 01/08/20 0839 .  feeding supplement (PRO-STAT SUGAR FREE 64) liquid 30 mL, 30 mL, Per Tube, BID, Rosalin Hawking, MD, 30 mL at 16/10/96 0454 .  folic acid (FOLVITE) tablet 1 mg, 1 mg, Per Tube, Daily,  Omar Person, NP, 1 mg at 01/08/20 1010 .  free water 50 mL, 50 mL, Per Tube, Q6H, Omar Person, NP, 50 mL at 01/08/20 0543 .  hydrALAZINE (APRESOLINE) injection 5 mg, 5 mg, Intravenous, Q6H PRN, Omar Person, NP, 5 mg at 01/07/20 1623 .  MEDLINE mouth rinse, 15 mL, Mouth Rinse, BID, Amie Portland, MD, 15 mL at 01/08/20 1013 .  senna-docusate (Senokot-S) tablet 1 tablet, 1 tablet, Oral, QHS PRN, Marliss Coots, PA-C .  thiamine tablet 100 mg, 100 mg, Per Tube, Daily, 100 mg at 01/08/20 1010 **OR** thiamine (B-1) injection 100 mg, 100 mg, Intravenous, Daily, Dewaine Oats, Danie Chandler, NP   Patients Current Diet:  Diet Order                  DIET - DYS 1 Room service appropriate? Yes; Fluid consistency: Nectar Thick  Diet effective now                         Precautions / Restrictions Precautions Precautions: Fall Precaution Comments: R gaze preference/L neglect Restrictions Weight Bearing Restrictions: No    Has the patient had 2 or more falls or a fall with injury in the past year?No   Prior Activity Level Community (5-7x/wk): active at baseline, driving, no DME used, caring for her grandkids on the weekend, keeping her house up, worked as a Sports coach in [place] Prior Function Level of Independence: Independent Comments: works [place]: Did the patient need help bathing, dressing, using the toilet or eating?  Independent   Indoor Mobility: Did the patient need assistance with walking from room to room (with or without device)? Independent   Stairs: Did the patient need assistance with internal or external stairs (with or without device)? Independent   Functional Cognition: Did the patient need help planning regular tasks such as shopping or remembering to take medications? Independent   Home Assistive Devices / Equipment Home Equipment: None   Prior Device Use: Indicate devices/aids used by the patient prior to current illness, exacerbation or injury? None of the above   Current Functional  Level Cognition   Arousal/Alertness: Lethargic Overall Cognitive Status: Impaired/Different from baseline Difficult to assess due to: Impaired communication Current Attention Level: Sustained Orientation Level: Oriented X4 Following Commands: Follows one step commands inconsistently, Follows one step commands with increased time Safety/Judgement: Decreased awareness of safety, Decreased awareness of deficits General Comments: pt flat overall during session but responding appropriately to commands with increased time. Pt noted to become tearful during session. Attention: Sustained Sustained Attention: Impaired Sustained Attention Impairment: Verbal basic Awareness: Impaired Awareness Impairment: Emergent impairment Problem Solving: Impaired Problem Solving Impairment: Functional basic    Extremity Assessment (includes Sensation/Coordination)   Upper Extremity Assessment: LUE deficits/detail RUE Deficits / Details: Pt moves Rt UE spontaneously  LUE Deficits / Details: No active ROM noted.  PROM WFL  LUE Coordination: decreased fine motor, decreased gross motor  Lower Extremity Assessment: Defer to PT evaluation RLE Deficits / Details: wiggles toes, flicker of activity in R quad/hip, otherwise limited AROM LLE Deficits / Details: flaccid LLE, no AROM, PROM WFL. Does withdraw to pain     ADLs   Overall ADL's : Needs assistance/impaired Eating/Feeding: NPO Eating/Feeding Details (indicate cue type and reason): Pt just passed her swallow test adn is able to drink thickened liquids; pt ableto verbalize that she passed her test but was unable to  communicate need to have thickener; perseverating on wanting to drink water at times Grooming: Wash/dry face, Set up Grooming Details (indicate cue type and reason): MIN A for cleanliness to wash face from recliner Upper Body Bathing: Maximal assistance, Sitting, Bed level Lower Body Bathing: Total assistance, Bed level Upper Body Dressing :  Maximal assistance, Sitting, Bed level Lower Body Dressing: Total assistance, Bed level Toilet Transfer: Maximal assistance, +2 for safety/equipment, Stand-pivot Toilet Transfer Details (indicate cue type and reason): simulated via stand pivot to recliner wth MAX A +2 to pts R side Toileting- Clothing Manipulation and Hygiene: Total assistance, Bed level Toileting - Clothing Manipulation Details (indicate cue type and reason): Pt incontinent of urine - assisted with clean up and peri care at bed level  Functional mobility during ADLs: Maximal assistance, +2 for physical assistance General ADL Comments: pt able to complete UB ADLs with MIN A this session; MAX A +2 for stand pivot to recliner. continues to present with R gaze preference and flaccid LUE     Mobility   Overal bed mobility: Needs Assistance Bed Mobility: Rolling, Sidelying to Sit Rolling: Mod assist Sidelying to sit: Mod assist, +2 for safety/equipment Supine to sit: Mod assist, +2 for physical assistance General bed mobility comments: multimodal cues for sequencing and hand over hand assist to reach with R UE to L bed rail; bed pad used to bring hips to EOB; assist then to elevate trunk into sitting with pt pushing through R UE      Transfers   Overall transfer level: Needs assistance Equipment used: None Transfer via Lift Equipment: Stedy Transfers: Sit to/from Stand Sit to Stand: +2 physical assistance, Mod assist, +2 safety/equipment Stand pivot transfers: Max assist, +2 physical assistance General transfer comment: cues for sequencing, posture, attempting to look midline at daughter; pt stood X 2 trials with mod A +2 for anterior and R lateral weight shift, L UE support and R hand grip, and to facilitate hip extension with bed pad     Ambulation / Gait / Stairs / Wheelchair Mobility   Ambulation/Gait General Gait Details: not able     Posture / Balance Dynamic Sitting Balance Sitting balance - Comments: varying levels  of assist needed however pt able to maintain sitting balance EOB with min guard-min A most of the time  Balance Overall balance assessment: Needs assistance Sitting-balance support: Single extremity supported, Feet supported Sitting balance-Leahy Scale: Poor Sitting balance - Comments: varying levels of assist needed however pt able to maintain sitting balance EOB with min guard-min A most of the time  Standing balance support: Bilateral upper extremity supported Standing balance-Leahy Scale: Poor Standing balance comment: pt able to maintain standing upright with +2 assist and Stedy standing frame; worked on bilateral weight shifting     Special needs/care consideration Insurance underwriter        Previous Environmental health practitioner (from acute therapy documentation) Living Arrangements: Other relatives  Lives With: Family Available Help at Discharge: Family, Available 24 hours/day Type of Home: House Home Layout: One level Home Access: Stairs to enter Entrance Stairs-Rails: None Entrance Stairs-Number of Steps: 3 Bathroom Shower/Tub: Public librarian, Architectural technologist: Standard   Discharge Living Setting Plans for Discharge Living Setting: Patient's home Type of Home at Discharge: House Discharge Home Layout: One level Discharge Home Access: Stairs to enter Entrance Stairs-Rails: None Entrance Stairs-Number of Steps: 2 Discharge Bathroom Shower/Tub: Tub/shower unit Discharge Bathroom Toilet: Standard Discharge Bathroom Accessibility: Yes How Accessible: Accessible via wheelchair Does the  patient have any problems obtaining your medications?: No   Social/Family/Support Systems Patient Roles: Caregiver (for her grandkids on the weekend) Anticipated Caregiver: Shemia Bevel (daughter) is primary contact, will have many caregivers Anticipated Caregiver's Contact Information: [phone] Ability/Limitations of Caregiver: min/mod assist Caregiver Availability:  24/7 Discharge Plan Discussed with Primary Caregiver: Yes Is Caregiver In Agreement with Plan?: Yes Does Caregiver/Family have Issues with Lodging/Transportation while Pt is in Rehab?: No     Goals Patient/Family Goal for Rehab: PT/OT/SLP min assist Expected length of stay: 27-30 days Pt/Family Agrees to Admission and willing to participate: Yes Program Orientation Provided & Reviewed with Pt/Caregiver Including Roles  & Responsibilities: Yes  Barriers to Discharge: Insurance for SNF coverage     Decrease burden of Care through IP rehab admission: n/a   Possible need for SNF placement upon discharge: Not anticipated    Patient Condition: This patient's medical and functional status has changed since the consult dated: 01/06/2020 in which the Rehabilitation Physician determined and documented that the patient's condition is appropriate for intensive rehabilitative care in an inpatient rehabilitation facility. See "History of Present Illness" (above) for medical update. Functional changes are: pt mod +2 to transfer, mod/max for ADLs. Patient's medical and functional status update has been discussed with the Rehabilitation physician and patient remains appropriate for inpatient rehabilitation. Will admit to inpatient rehab today.   Preadmission Screen Completed By:  Michel Santee, PT, DPT 01/08/2020 11:10 AM ______________________________________________________________________   Discussed status with Dr. Posey Pronto on 01/08/20 at 11:20 AM  and received approval for admission today.   Admission Coordinator:  Michel Santee, PT, DPT time 11:20 AM Sudie Grumbling 01/08/20

## 2020-01-09 ENCOUNTER — Inpatient Hospital Stay (HOSPITAL_COMMUNITY): Payer: Medicaid Other | Admitting: Physical Therapy

## 2020-01-09 ENCOUNTER — Inpatient Hospital Stay (HOSPITAL_COMMUNITY): Payer: Medicaid Other | Admitting: Speech Pathology

## 2020-01-09 ENCOUNTER — Inpatient Hospital Stay (HOSPITAL_COMMUNITY): Payer: Medicaid Other

## 2020-01-09 DIAGNOSIS — I63511 Cerebral infarction due to unspecified occlusion or stenosis of right middle cerebral artery: Secondary | ICD-10-CM

## 2020-01-09 LAB — COMPREHENSIVE METABOLIC PANEL
ALT: 58 U/L — ABNORMAL HIGH (ref 0–44)
AST: 30 U/L (ref 15–41)
Albumin: 3.3 g/dL — ABNORMAL LOW (ref 3.5–5.0)
Alkaline Phosphatase: 89 U/L (ref 38–126)
Anion gap: 13 (ref 5–15)
BUN: 38 mg/dL — ABNORMAL HIGH (ref 6–20)
CO2: 24 mmol/L (ref 22–32)
Calcium: 10.4 mg/dL — ABNORMAL HIGH (ref 8.9–10.3)
Chloride: 107 mmol/L (ref 98–111)
Creatinine, Ser: 0.86 mg/dL (ref 0.44–1.00)
GFR calc Af Amer: >60 mL/min (ref >60)
GFR calc non Af Amer: >60 mL/min (ref >60)
Glucose, Bld: 146 mg/dL — ABNORMAL HIGH (ref 70–99)
Potassium: 4.4 mmol/L (ref 3.5–5.1)
Sodium: 144 mmol/L (ref 135–145)
Total Bilirubin: 0.4 mg/dL (ref 0.3–1.2)
Total Protein: 7.2 g/dL (ref 6.5–8.1)

## 2020-01-09 LAB — CBC WITH DIFFERENTIAL/PLATELET
Abs Immature Granulocytes: 0.04 10*3/uL (ref 0.00–0.07)
Basophils Absolute: 0.1 10*3/uL (ref 0.0–0.1)
Basophils Relative: 1 %
Eosinophils Absolute: 0.9 10*3/uL — ABNORMAL HIGH (ref 0.0–0.5)
Eosinophils Relative: 8 %
HCT: 43.2 % (ref 36.0–46.0)
Hemoglobin: 13.4 g/dL (ref 12.0–15.0)
Immature Granulocytes: 0 %
Lymphocytes Relative: 23 %
Lymphs Abs: 2.7 10*3/uL (ref 0.7–4.0)
MCH: 27.9 pg (ref 26.0–34.0)
MCHC: 31 g/dL (ref 30.0–36.0)
MCV: 89.8 fL (ref 80.0–100.0)
Monocytes Absolute: 0.8 10*3/uL (ref 0.1–1.0)
Monocytes Relative: 7 %
Neutro Abs: 7.2 10*3/uL (ref 1.7–7.7)
Neutrophils Relative %: 61 %
Platelets: 331 10*3/uL (ref 150–400)
RBC: 4.81 MIL/uL (ref 3.87–5.11)
RDW: 12.5 % (ref 11.5–15.5)
WBC: 11.7 10*3/uL — ABNORMAL HIGH (ref 4.0–10.5)
nRBC: 0 % (ref 0.0–0.2)

## 2020-01-09 LAB — GLUCOSE, CAPILLARY: Glucose-Capillary: 120 mg/dL — ABNORMAL HIGH (ref 70–99)

## 2020-01-09 MED ORDER — SODIUM CHLORIDE 0.9 % NICU IV INFUSION SIMPLE
INJECTION | INTRAVENOUS | Status: DC
  Filled 2020-01-09 (×3): qty 500

## 2020-01-09 MED ORDER — SODIUM CHLORIDE 0.9 % IV SOLN
INTRAVENOUS | Status: AC
  Administered 2020-01-09: 1000 mL via INTRAVENOUS

## 2020-01-09 NOTE — Progress Notes (Addendum)
Initial Nutrition Assessment  DOCUMENTATION CODES:   Obesity unspecified  INTERVENTION:   Continue calorie count through the weekend, RD to follow-up with results on Monday.  Continue nocturnal tube feeding via Cortrak with Osmolite 1.5 at 50 ml/h x 10 hours (6pm-4am) and Pro-stat 30 ml BID to meet ~50% of estimated kcal and protein needs.   Vital Cuisine Shake BID with meals, each supplement provides 520 kcal and 22 grams of protein.  Magic cup BID with meals, each supplement provides 290 kcal and 9 grams of protein.  Continue MVI with minerals daily.  NUTRITION DIAGNOSIS:   Inadequate oral intake related to dysphagia as evidenced by meal completion < 50%.  GOAL:   Patient will meet greater than or equal to 90% of their needs  MONITOR:   PO intake, Supplement acceptance, Diet advancement, Labs, TF tolerance  REASON FOR ASSESSMENT:   Consult Calorie Count  ASSESSMENT:   59 yo female admitted to Rehab from [facility] S/P stroke with functional deficits. PMH includes CAD, coronary vasospasms, asthma, tobacco abuse.   S/P thrombectomy on 6/17. Transferred to rehab unit on 6/25.  Cortrak feeding tube placed on 6/18. Patient was transitioned to nocturnal feedings 6/25 and calorie count was ordered.  Currently receiving Osmolite 1.5 at 50 ml/h, running from 6pm to 4am, with Pro-stat 30 ml BID. This provides 950 kcal, 61 gm protein, 381 ml free water daily. Free water flushes 100 ml TID.   S/P MBS on 6/24. Diet advanced to dysphagia 1 with nectar thick liquids. Meal intakes have not been recorded since admission to rehab. In acute care patient was consuming 35-45% of meals.   No new weight available since admission to acute care on 6/17.   Labs reviewed. Medications reviewed and include folic acid, MVI with minerals, thiamine.   NUTRITION - FOCUSED PHYSICAL EXAM:  unable to complete  Diet Order:   Diet Order            DIET - DYS 1 Room service appropriate? Yes; Fluid  consistency: Nectar Thick  Diet effective now                 EDUCATION NEEDS:   No education needs have been identified at this time  Skin:  Skin Assessment: Skin Integrity Issues: Skin Integrity Issues:: Incisions Incisions: R groin  Last BM:  6/25 type 7  Height:   Ht Readings from Last 1 Encounters:  12/31/19 5\' 6"  (1.676 m)    Weight:   Wt Readings from Last 1 Encounters:  12/31/19 91.4 kg    Ideal Body Weight:  59.1 kg  Estimated Nutritional Needs:   Kcal:  1800-2000  Protein:  100-120 gm  Fluid:  >/= 1.8 L    Lucas Mallow, RD, LDN, CNSC Please refer to Amion for contact information.

## 2020-01-09 NOTE — Plan of Care (Signed)
  Problem: RH Balance Goal: LTG Patient will maintain dynamic sitting balance (PT) Description: LTG:  Patient will maintain dynamic sitting balance with assistance during mobility activities (PT) Flowsheets (Taken 01/09/2020 1452) LTG: Pt will maintain dynamic sitting balance during mobility activities with:: Supervision/Verbal cueing Goal: LTG Patient will maintain dynamic standing balance (PT) Description: LTG:  Patient will maintain dynamic standing balance with assistance during mobility activities (PT) Flowsheets (Taken 01/09/2020 1452) LTG: Pt will maintain dynamic standing balance during mobility activities with:: Moderate Assistance - Patient 50 - 74%   Problem: RH Bed Mobility Goal: LTG Patient will perform bed mobility with assist (PT) Description: LTG: Patient will perform bed mobility with assistance, with/without cues (PT). Flowsheets (Taken 01/09/2020 1452) LTG: Pt will perform bed mobility with assistance level of: Moderate Assistance - Patient 50 - 74%   Problem: RH Car Transfers Goal: LTG Patient will perform car transfers with assist (PT) Description: LTG: Patient will perform car transfers with assistance (PT). Flowsheets (Taken 01/09/2020 1452) LTG: Pt will perform car transfers with assist:: Moderate Assistance - Patient 50 - 74%   Problem: RH Bed to Chair Transfers Goal: LTG Patient will perform bed/chair transfers w/assist (PT) Description: LTG: Patient will perform bed to chair transfers with assistance (PT). Flowsheets (Taken 01/09/2020 1452) LTG: Pt will perform Bed to Chair Transfers with assistance level: Moderate Assistance - Patient 50 - 74%   Problem: RH Ambulation Goal: LTG Patient will ambulate in controlled environment (PT) Description: LTG: Patient will ambulate in a controlled environment, # of feet with assistance (PT). Flowsheets (Taken 01/09/2020 1452) LTG: Pt will ambulate in controlled environ  assist needed:: Maximal Assistance - Patient 25 -  49% LTG: Ambulation distance in controlled environment: 50ft with LRAD with PT to force use of RLE.   Problem: RH Wheelchair Mobility Goal: LTG Patient will propel w/c in controlled environment (PT) Description: LTG: Patient will propel wheelchair in controlled environment, # of feet with assist (PT) Flowsheets (Taken 01/09/2020 1452) LTG: Pt will propel w/c in controlled environ  assist needed:: Minimal Assistance - Patient > 75% LTG: Propel w/c distance in controlled environment: 150 Goal: LTG Patient will propel w/c in home environment (PT) Description: LTG: Patient will propel wheelchair in home environment, # of feet with assistance (PT). Flowsheets (Taken 01/09/2020 1452) LTG: Pt will propel w/c in home environ  assist needed:: Minimal Assistance - Patient > 75% Distance: wheelchair distance in controlled environment: 50
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
## 2020-01-09 NOTE — Progress Notes (Signed)
[place] PHYSICAL MEDICINE & REHABILITATION PROGRESS NOTE   Subjective/Complaints:  Pt reports she wants water- with significant dysarthria, told me only had 1 BM this week- wasn't clear if this is normal.   On nectar thick liquids- emphatic doesn't want nectar thick liquids or ice chips, wants water.     ROS: Unable to assess due to cognition/aphasia  Objective:   No results found. Recent Labs    01/09/20 0600  WBC 11.7*  HGB 13.4  HCT 43.2  PLT 331   Recent Labs    01/09/20 0600  NA 144  K 4.4  CL 107  CO2 24  GLUCOSE 146*  BUN 38*  CREATININE 0.86  CALCIUM 10.4*    Intake/Output Summary (Last 24 hours) at 01/09/2020 1558 Last data filed at 01/08/2020 2100 Gross per 24 hour  Intake 50 ml  Output --  Net 50 ml     Physical Exam: Vital Signs Blood pressure (!) 150/80, pulse 84, temperature 97.8 F (36.6 C), resp. rate 16, SpO2 98 %.  Physical Exam Vitals and nursing note reviewed.  Constitutional:      General: She is not in acute distress. Laying in bed- appropriate, but hard to understand due to aphasia/dysarthria, NAD    Comments: + NG - unchanged HENT:   R gaze preference  Cardiovascular: RRR  Pulmonary: a little coarse/ junky- but good air movement B/L  Abdominal: soft, NT; somewhat distended- hypoactive BS  Musculoskeletal:     Cervical back: Normal range of motion.     Comments: No edema or tenderness in extremities  Skin:    General: Skin is warm and dry.  Neurological:     Mental Status: She is alert.     Comments: Oriented- but hard to speak Dysarthria and slow to initiate still Left inattention  Motor: RUE/RLE: 5/5 proximal distal LUE/LLE: 0/5 proximal distal No increase in tone noted  Psychiatric:     Comments: sad affect     Assessment/Plan: 1. Functional deficits secondary to R MCA infarct with L hemiplegia, R gaze preference and L inattention as well as dysphagia  which require 3+ hours per day of interdisciplinary  therapy in a comprehensive inpatient rehab setting.  Physiatrist is providing close team supervision and 24 hour management of active medical problems listed below.  Physiatrist and rehab team continue to assess barriers to discharge/monitor patient progress toward functional and medical goals  Care Tool:  Bathing    Body parts bathed by patient: Chest, Face, Front perineal area   Body parts bathed by helper: Right arm, Left arm, Abdomen, Buttocks, Left upper leg, Right lower leg, Left lower leg     Bathing assist Assist Level: Maximal Assistance - Patient 24 - 49%     Upper Body Dressing/Undressing Upper body dressing   What is the patient wearing?: Hospital gown only    Upper body assist Assist Level: Total Assistance - Patient < 25%    Lower Body Dressing/Undressing Lower body dressing      What is the patient wearing?: Incontinence brief     Lower body assist Assist for lower body dressing: Total Assistance - Patient < 25%     Toileting Toileting    Toileting assist Assist for toileting: Total Assistance - Patient < 25%     Transfers Chair/bed transfer  Transfers assist     Chair/bed transfer assist level: Total Assistance - Patient < 25%     Locomotion Ambulation   Ambulation assist   Ambulation activity did  not occur: Safety/medical concerns          Walk 10 feet activity   Assist  Walk 10 feet activity did not occur: Safety/medical concerns        Walk 50 feet activity   Assist Walk 50 feet with 2 turns activity did not occur: Safety/medical concerns         Walk 150 feet activity   Assist Walk 150 feet activity did not occur: Safety/medical concerns         Walk 10 feet on uneven surface  activity   Assist Walk 10 feet on uneven surfaces activity did not occur: Safety/medical concerns         Wheelchair     Assist Will patient use wheelchair at discharge?: Yes Type of Wheelchair: Manual    Wheelchair  assist level: Dependent - Patient 0% Max wheelchair distance: 150    Wheelchair 50 feet with 2 turns activity    Assist        Assist Level: Dependent - Patient 0%   Wheelchair 150 feet activity     Assist      Assist Level: Dependent - Patient 0%   Blood pressure (!) 150/80, pulse 84, temperature 97.8 F (36.6 C), resp. rate 16, SpO2 98 %.  Medical Problem List and Plan: 1.  Left hemiplegia with right gaze preference, has difficulty tracking beyond midline to the left and continues to have limited verbal output secondary to large right MCA infarct.              -patient may not shower until can participate in shower             -ELOS/Goals: 27-30 days/min a             Admit to CIR 2.  Antithrombotics: -DVT/anticoagulation:  Pharmaceutical: Lovenox             -antiplatelet therapy: On ASA/Plavix 3. Pain Management: Tylenol prn.  4. Mood: LCSW to follow for evaluation and support.              -antipsychotic agents: N/A 5. Neuropsych: This patient is not fully capable of making decisions on his own behalf. 6. Skin/Wound Care: Routine pressure relief measures 7. Fluids/Electrolytes/Nutrition: Monitor I/Os.  CMP ordered. 8. HTN: Monitor BP tid--Imdur, lisinopril, cardizem and amlodipine has been on hold. Will resume Cardizem 30 mg qid as tachycardia noted and BP still labile.  6/26- BP 150/80- Pulse 84- improved, but don't want to decrease too fast because might cause BP to go too low              Monitor with increased mobility. 9. UTI: Completed 7 day course of ceftriaxone on today. 10. COPD: Continue Pulmicort nebs bid. 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 To contact Dr. Noralee Space closer to discharge.  12.  Post stroke dysphagia: Continue dysphagia 2, nectar liquids. Change tube feeds to nights. Add water flushes.  BMP ordered.  6/26- pt asking for water- can have Ice chips- hopefully giving some fluids will make her feel less dry    Advance diet as  tolerated. 13. Insomnia?:  Will monitor sleep wake cycle. iWas on ambien and trazodone PTA? Will order Ambien prn for now.   14. Azotemia due to dysphagia  6/26- will give NS IVFs due to BUN of 38-recheck labs Monday 15. Hyperglycemia  6/26- A1c is 5.5- likely due to TFs- will d/c BGs. Not high- running 100-140s- will d/c BGs if can find  an order    LOS: 1 days A FACE TO FACE EVALUATION WAS PERFORMED  Leeanne Butters 01/09/2020, 3:58 PM
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
## 2020-01-10 LAB — GLUCOSE, CAPILLARY
Glucose-Capillary: 103 mg/dL — ABNORMAL HIGH (ref 70–99)
Glucose-Capillary: 91 mg/dL (ref 70–99)
Glucose-Capillary: 74 mg/dL (ref 70–99)
Glucose-Capillary: 111 mg/dL — ABNORMAL HIGH (ref 70–99)

## 2020-01-10 NOTE — Progress Notes (Signed)
[place] PHYSICAL MEDICINE & REHABILITATION PROGRESS NOTE   Subjective/Complaints:  Pt tearful- kept asking for water- "wanted water"- explained cannot give her water or risks pneumonia- She nodded head yes when asked if she felt "dry"- has labs tomorrow to see if IVFs helped.    LBM documented 6/25- if doesn't have BM by tomorrow, needs meds.   ROS: Unable to assess due to cognition/aphasia  Objective:   No results found. Recent Labs    01/09/20 0600  WBC 11.7*  HGB 13.4  HCT 43.2  PLT 331   Recent Labs    01/09/20 0600  NA 144  K 4.4  CL 107  CO2 24  GLUCOSE 146*  BUN 38*  CREATININE 0.86  CALCIUM 10.4*    Intake/Output Summary (Last 24 hours) at 01/10/2020 1439 Last data filed at 01/10/2020 1210 Gross per 24 hour  Intake 355.09 ml  Output --  Net 355.09 ml     Physical Exam: Vital Signs Blood pressure 133/90, pulse 83, temperature 97.7 F (36.5 C), resp. rate 18, SpO2 100 %.  Physical Exam Vitals and nursing note reviewed.  Constitutional:      General: pt laying in bed- wearing glasses- taped in middle, crying/tearful, NAD    Comments: + NG - unchanged HENT:   R gaze preference  Cardiovascular: RRR Pulmonary: a little coarse, but better- good air movement,  Abdominal: soft, slightly distended; NT; hypoactive BS Musculoskeletal:     Cervical back: Normal range of motion.     Comments: No edema or tenderness in extremities  Skin:    General: Skin is warm and dry.  Neurological:     Mental Status: She is alert.     Comments: vague- hard to tell orientation- perseverattes on water Dysarthria and slow to initiate still Left inattention  Motor: RUE/RLE: 5/5 proximal distal LUE/LLE: 0/5 proximal distal No increase in tone noted  Psychiatric:     Comments: tearful affect     Assessment/Plan: 1. Functional deficits secondary to R MCA infarct with L hemiplegia, R gaze preference and L inattention as well as dysphagia  which require 3+ hours  per day of interdisciplinary therapy in a comprehensive inpatient rehab setting.  Physiatrist is providing close team supervision and 24 hour management of active medical problems listed below.  Physiatrist and rehab team continue to assess barriers to discharge/monitor patient progress toward functional and medical goals  Care Tool:  Bathing    Body parts bathed by patient: Chest, Face, Front perineal area   Body parts bathed by helper: Right arm, Left arm, Abdomen, Buttocks, Left upper leg, Right lower leg, Left lower leg     Bathing assist Assist Level: Maximal Assistance - Patient 24 - 49%     Upper Body Dressing/Undressing Upper body dressing   What is the patient wearing?: Hospital gown only    Upper body assist Assist Level: Total Assistance - Patient < 25%    Lower Body Dressing/Undressing Lower body dressing      What is the patient wearing?: Incontinence brief     Lower body assist Assist for lower body dressing: Total Assistance - Patient < 25%     Toileting Toileting    Toileting assist Assist for toileting: Total Assistance - Patient < 25%     Transfers Chair/bed transfer  Transfers assist     Chair/bed transfer assist level: Total Assistance - Patient < 25%     Locomotion Ambulation   Ambulation assist   Ambulation activity did not  occur: Safety/medical concerns          Walk 10 feet activity   Assist  Walk 10 feet activity did not occur: Safety/medical concerns        Walk 50 feet activity   Assist Walk 50 feet with 2 turns activity did not occur: Safety/medical concerns         Walk 150 feet activity   Assist Walk 150 feet activity did not occur: Safety/medical concerns         Walk 10 feet on uneven surface  activity   Assist Walk 10 feet on uneven surfaces activity did not occur: Safety/medical concerns         Wheelchair     Assist Will patient use wheelchair at discharge?: Yes Type of  Wheelchair: Manual    Wheelchair assist level: Dependent - Patient 0% Max wheelchair distance: 150    Wheelchair 50 feet with 2 turns activity    Assist        Assist Level: Dependent - Patient 0%   Wheelchair 150 feet activity     Assist      Assist Level: Dependent - Patient 0%   Blood pressure 133/90, pulse 83, temperature 97.7 F (36.5 C), resp. rate 18, SpO2 100 %.  Medical Problem List and Plan: 1.  Left hemiplegia with right gaze preference, has difficulty tracking beyond midline to the left and continues to have limited verbal output secondary to large right MCA infarct.              -patient may not shower until can participate in shower             -ELOS/Goals: 27-30 days/min a             Admit to CIR 2.  Antithrombotics: -DVT/anticoagulation:  Pharmaceutical: Lovenox             -antiplatelet therapy: On ASA/Plavix 3. Pain Management: Tylenol prn.  4. Mood: LCSW to follow for evaluation and support.              -antipsychotic agents: N/A 5. Neuropsych: This patient is not fully capable of making decisions on his own behalf. 6. Skin/Wound Care: Routine pressure relief measures 7. Fluids/Electrolytes/Nutrition: Monitor I/Os.  CMP ordered. 8. HTN: Monitor BP tid--Imdur, lisinopril, cardizem and amlodipine has been on hold. Will resume Cardizem 30 mg qid as tachycardia noted and BP still labile.  6/26- BP 150/80- Pulse 84- improved, but don't want to decrease too fast because might cause BP to go too low  6/27- BP well controlled- con't regimen              Monitor with increased mobility. 9. UTI: Completed 7 day course of ceftriaxone on today.  6/27- WBC slightly elevated to 11.7- suggest rechecking when not as dry and see if responded to ABX- didn't have urine Cx 10. COPD: Continue Pulmicort nebs bid. 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 To contact Dr. Noralee Space closer to discharge.  12.  Post stroke dysphagia: Continue dysphagia 2, nectar  liquids. Change tube feeds to nights. Add water flushes.  BMP ordered.  6/26- pt asking for water- can have Ice chips- hopefully giving some fluids will make her feel less dry    Advance diet as tolerated. 13. Insomnia?:  Will monitor sleep wake cycle. iWas on ambien and trazodone PTA? Will order Ambien prn for now.   14. Azotemia due to dysphagia  6/26- will give NS IVFs due to BUN  of 38-recheck labs Monday 15. Hyperglycemia  6/26- A1c is 5.5- likely due to TFs- will d/c BGs. Not high- running 100-140s- will d/c BGs if can find an order  16. Constipation  6/27- LBM 2 days ago- getting TFs/diet- not eating a lot- if no BM by tomorrow, suggest meds.     LOS: 2 days A FACE TO FACE EVALUATION WAS PERFORMED  Rana Adorno 01/10/2020, 2:39 PM

## 2020-01-10 NOTE — Plan of Care (Signed)
  Problem: RH BLADDER ELIMINATION Goal: RH STG MANAGE BLADDER WITH ASSISTANCE Description: STG Manage Bladder With Assistance MOd Outcome: Progressing   Problem: RH COGNITION-NURSING Goal: RH STG USES MEMORY AIDS/STRATEGIES W/ASSIST TO PROBLEM SOLVE Description: STG Uses Memory Aids/Strategies With Assistance to Problem Solve. Mod Outcome: Progressing

## 2020-01-11 ENCOUNTER — Inpatient Hospital Stay (HOSPITAL_COMMUNITY): Payer: Medicaid Other

## 2020-01-11 ENCOUNTER — Inpatient Hospital Stay (HOSPITAL_COMMUNITY): Payer: Medicaid Other | Admitting: Occupational Therapy

## 2020-01-11 ENCOUNTER — Inpatient Hospital Stay (HOSPITAL_COMMUNITY): Payer: Medicaid Other | Admitting: Physical Therapy

## 2020-01-11 LAB — BASIC METABOLIC PANEL
Anion gap: 8 (ref 5–15)
BUN: 36 mg/dL — ABNORMAL HIGH (ref 6–20)
CO2: 29 mmol/L (ref 22–32)
Calcium: 10.2 mg/dL (ref 8.9–10.3)
Chloride: 106 mmol/L (ref 98–111)
Creatinine, Ser: 0.88 mg/dL (ref 0.44–1.00)
GFR calc Af Amer: >60 mL/min (ref >60)
GFR calc non Af Amer: >60 mL/min (ref >60)
Glucose, Bld: 95 mg/dL (ref 70–99)
Potassium: 4.3 mmol/L (ref 3.5–5.1)
Sodium: 143 mmol/L (ref 135–145)

## 2020-01-11 LAB — CBC
HCT: 42.4 % (ref 36.0–46.0)
Hemoglobin: 13.1 g/dL (ref 12.0–15.0)
MCH: 28.3 pg (ref 26.0–34.0)
MCHC: 30.9 g/dL (ref 30.0–36.0)
MCV: 91.6 fL (ref 80.0–100.0)
Platelets: 344 10*3/uL (ref 150–400)
RBC: 4.63 MIL/uL (ref 3.87–5.11)
RDW: 12.3 % (ref 11.5–15.5)
WBC: 11 10*3/uL — ABNORMAL HIGH (ref 4.0–10.5)
nRBC: 0 % (ref 0.0–0.2)

## 2020-01-11 LAB — GLUCOSE, CAPILLARY
Glucose-Capillary: 105 mg/dL — ABNORMAL HIGH (ref 70–99)
Glucose-Capillary: 77 mg/dL (ref 70–99)
Glucose-Capillary: 72 mg/dL (ref 70–99)

## 2020-01-11 MED ORDER — OSMOLITE 1.5 CAL PO LIQD
600.0000 mL | ORAL | Status: DC
  Administered 2020-01-11 – 2020-01-19 (×9): 600 mL
  Filled 2020-01-11 (×5): qty 711

## 2020-01-11 MED ORDER — DOCUSATE SODIUM 50 MG/5ML PO LIQD
100.0000 mg | Freq: Every day | ORAL | Status: DC
  Administered 2020-01-11: 100 mg via ORAL
  Filled 2020-01-11 (×2): qty 10

## 2020-01-11 MED ORDER — BLOOD PRESSURE CONTROL BOOK
Freq: Once | Status: AC
  Administered 2020-01-11: 1
  Filled 2020-01-11 (×2): qty 1

## 2020-01-11 NOTE — Progress Notes (Signed)
Inpatient Rehabilitation Center Individual Statement of Services  Patient Name:  Anne [name]  Date:  01/11/2020  Welcome to the [place] [facility].  Our goal is to provide you with an individualized program based on your diagnosis and situation, designed to meet your specific needs.  With this comprehensive rehabilitation program, you will be expected to participate in at least 3 hours of rehabilitation therapies Monday-Friday, with modified therapy programming on the weekends.  Your rehabilitation program will include the following services:  Physical Therapy (PT), Occupational Therapy (OT), Speech Therapy (ST), 24 hour per day rehabilitation nursing, Therapeutic Recreaction (TR), Neuropsychology, Care Coordinator, Rehabilitation Medicine, Nutrition Services, Pharmacy Services and Other  Weekly team conferences will be held on Wednesdays to discuss your progress.  Your Inpatient Rehabilitation Care Coordinator will talk with you frequently to get your input and to update you on team discussions.  Team conferences with you and your family in attendance may also be held.  Expected length of stay: 27- 30 Days  Overall anticipated outcome: Min A  Depending on your progress and recovery, your program may change. Your Inpatient Rehabilitation Care Coordinator will coordinate services and will keep you informed of any changes. Your Inpatient Rehabilitation Care Coordinator's name and contact numbers are listed  below.  The following services may also be recommended but are not provided by the [place]:    [place] will be made to provide these services after discharge if needed.  Arrangements include referral to agencies that provide these services.  Your insurance has been verified to be:  Medicaid Your primary doctor is:  Tomasa Hose, MD  Pertinent information will be shared  with your doctor and your insurance company.  Inpatient Rehabilitation Care Coordinator:  Erlene Quan, [place] or [phone]  Information discussed with and copy given to patient by: [name], 01/11/2020, 9:43 AM

## 2020-01-11 NOTE — Care Management (Signed)
Patient ID: Anne [name], female   DOB: [birth year], 59 y.o.   MRN: [id number] Met with the patient to review role of the CM and addressing nursing concerns and collaboration with SW [name]) to facilitate prep for discharge. Reviewed risk factors for secondary stroke including HTN, dyslipidemia and smoking. Reviewed DAPT and rationale for medications. Patient reports she was not interested in smoking cessation and reviewed effects of smoking on the body and health. Reviewed options to help with smoking cessation along with HH/fat and cholesterol modified diet. Information left for grand-children who she reports will be the care providers at discharge when her daughter is working. Also reviewed dysphagia and dietary restriction rationale. Continue to follow case to address nursing issues.

## 2020-01-11 NOTE — Progress Notes (Signed)
Occupational Therapy Session Note  Patient Details  Name: Anne [name] MRN: [id number] Date of Birth: April 03, 1961  Today's Date: 01/11/2020 OT Individual Time: 0034-9179 OT Individual Time Calculation (min): 54 min  and Today's Date: 01/11/2020 OT Missed Time: 20 Minutes Missed Time Reason: Patient fatigue   Short Term Goals: Week 1:  OT Short Term Goal 1 (Week 1): Pt will complete sit<>stand for ADL with max A. OT Short Term Goal 2 (Week 1): Pt will locate 3 self-care items on L side with mod cues. OT Short Term Goal 3 (Week 1): Pt will don shirt with mod A using hemi dressing techniques. OT Short Term Goal 4 (Week 1): Pt will demonstrate static sitting balance for 30+ seconds with CGA.  Skilled Therapeutic Interventions/Progress Updates:    Pt transitioned from PT session without issue. Pt able to accurately tell therapist her location, home address, and situation. Pt was very tearful and reported wanting to be home with family. Pt unable to verbalize date and when given choice of two she reported month as March. OT assisting pt with small sips from spoon of thickened liquids. Pt needing set up A for suction toothbrush but she was able to perform oral care needing cuing to terminate task. OT placing lotion on L UE and needing max assist to locate L UE but she did rub in lotion. Hand over hand assistance to apply to R UE with use of L UE. Pt very fatigued at this point and begins closing eyes. OT set up wheelchair for squat pivot transfer. Pt was dependent to the R side and +2 assistance needed for sit >supine for safety. +2 needed for positioning and to protect the hemiplegic side. Pt asleep as therapist exits the room. Call bell within reach and bed alarm activated.   Therapy Documentation Precautions:  Precautions Precautions: Other (comment) Precaution Comments: L hemi Restrictions Weight Bearing Restrictions: No General: General OT Amount of Missed Time: 20 Minutes Vital  Signs:  Pain: Pain Assessment Pain Scale: 0-10 Pain Score: 0-No pain Faces Pain Scale: No hurt   Therapy/Group: Individual Therapy  [name] 01/11/2020, 10:08 AM

## 2020-01-11 NOTE — Progress Notes (Signed)
Physical Therapy Session Note  Patient Details  Name: Anne [name] MRN: [id number] Date of Birth: [birth year]  Today's Date: 01/11/2020 PT Individual Time: 0809-0908 PT Individual Time Calculation (min): 59 min   Short Term Goals: Week 1:  PT Short Term Goal 1 (Week 1): Pt will sit EOB with min assist x 5 mintues PT Short Term Goal 2 (Week 1): Pt will initiate gait training PT Short Term Goal 3 (Week 1): Pt will transfer to and from [facility] with mod assist +2 using SB. PT Short Term Goal 4 (Week 1): Pt will perform bed mobility with max assist of 1  Skilled Therapeutic Interventions/Progress Updates: Pt presented in bed awake and agreeable to therapy. Pt denies pain at start of session. Mark, NT present to assist for part of session. Pt noted to be incontinent of bladder. Performed rolling L maxA x2 with max cues to turn head to L and to reach with RUE when rolling to L. Pt was dependent x 2 to roll to R. PTA changed brief dependent and threaded pants dependent with rolling L/R in same manner as prior to pull pants over hips. Pt was maxA to total A supine to sit at EOB. Pt required maxA for unsupported sitting balance with maxA for donning shirt. Performed STS in [place] x 2 for transfer to w/c. PTA then provided supervision for breakfast with PTA stopping pt when consuming nectar thick liquid due to taking large sips. Pt with x2 episodes of coughing during meal. Pt required max verbal cues throughout meal to scan to L in order to find breakfast items and was unable to maintain neutral gaze. Nsg arrived to administer meds with PTA obtaining half lap tray for pt. Pt then handed off to [place], [place] for next session.      Therapy Documentation Precautions:  Precautions Precautions: Other (comment) Precaution Comments: L hemi Restrictions Weight Bearing Restrictions: No General:   Vital Signs: Therapy Vitals Temp: 98.2 F (36.8 C) Temp Source: Oral Pulse Rate: 91 Resp: 20 BP: (!)  152/65 Patient Position (if appropriate): Lying Oxygen Therapy SpO2: 98 % O2 Device: Room Air Pain: Pain Assessment Pain Score: 0-No pain Mobility:   Locomotion :    Trunk/Postural Assessment :    Balance:   Exercises:   Other Treatments:      Therapy/Group: Individual Therapy  Santino Kinsella 01/11/2020, 4:43 PM

## 2020-01-11 NOTE — Progress Notes (Signed)
Calorie Count Note  72-hour calorie count ordered.  Diet: Dysphagia 1, nectar-thick Supplements: - Vital Cuisine Shake BID with meals, each supplement provides 520 kcal and 22 grams of protein - Magic Cup BID with meals, each supplement provides 290 kcal and 9 grams of protein.  Day 1: 6/25 Dinner: no data available 6/26 Breakfast: 78 kcal, 4 grams protein 6/26 Lunch: 0 kcal, 0 grams protein Supplements: 0 kcal, 0 grams protein  Day 1 total 24-hour intake: 78 kcal (4% of minimum estimated needs)  4 grams protein (4% of minimum estimated needs)  Day 2: 6/26 Dinner: 256 kcal, 11 grams protein 6/27 Breakfast: 164 kcal, 5 grams protein 6/27 Lunch: 198 kcal, 5 grams protein Supplements: included in above  Day 2 total 24-hour intake: 618 kcal (34% of minimum estimated needs)  21 grams protein (21% of minimum estimated needs)  Day 3: 6/27 Dinner: 245 kcal, 9 grams protein 6/28 Breakfast: 383 kcal, 21 grams protein 6/28 Lunch: 460 kcal, 19 grams protein Supplements: included in above  Day 3 total 24-hour intake: 1088 kcal (60% of minimum estimated needs)  49 grams protein (49% of minimum estimated needs)  Nutrition Diagnosis: Inadequate oral intake related to dysphagia as evidenced by meal completion < 50%.  Goal: Patient will meet greater than or equal to 90% of their needs  Intervention: - Continue Vital Cuisine Shake BID with meals - Continue Magic Cup BID with meals - Continue MVI with minerals daily - Recommend continuing nocturnal tube feeding via Cortrak with Osmolite 1.5 @ 60 ml/hr for 10 hours from 1800 to 0400 with Pro-stat 30 ml BID (provides 1100 kcal and 68 grams protein) - d/c calorie count   Gaynell Face, MS, RD, LDN Inpatient Clinical Dietitian Pager: [phone] Weekend/After Hours: [phone]

## 2020-01-11 NOTE — IPOC Note (Signed)
Overall Plan of Care [facility]) Patient Details Name: Niana Martorana MRN: [id number] DOB: Aug 06, 1960  Admitting Diagnosis: Acute right MCA stroke [facility])  Hospital Problems: Principal Problem:   Acute right MCA stroke [facility])     Functional Problem List: Nursing Nutrition, Skin Integrity, Bowel, Bladder, Medication Management, Safety  PT Balance, Behavior, Edema, Endurance, Motor, Nutrition, Pain, Perception, Safety, Sensory, Skin Integrity, Other (comment)  OT Balance, Perception, Safety, Sensory, Cognition, Vision, Motor, Endurance  SLP Cognition, Linguistic, Nutrition  TR         Basic ADLs: OT Eating, Grooming, Bathing, Dressing, Toileting     Advanced  ADLs: OT       Transfers: PT Bed Mobility, Bed to Chair, Car, [employer], Floor  OT Toilet, Tub/Shower     Locomotion: PT Ambulation, Wheelchair Mobility     Additional Impairments: OT Fuctional Use of Upper Extremity  SLP Swallowing, Communication, Social Cognition expression Problem Solving, Memory, Attention, Awareness  TR      Anticipated Outcomes Item Anticipated Outcome  Self Feeding Set-up assist  Swallowing  supervision   Basic self-care  Min A  Toileting  Min A   Bathroom Transfers Min A  Bowel/Bladder  manage bowel and bladder with mod assist  Transfers  mod assist transfers with SB to and from bed  Locomotion  WC level with min assist at house hold distances.  Communication  min assist  Cognition  min assist  Pain  no pain or under 2  Safety/Judgment  remain free of falls, infection and skin breakdown   Therapy Plan: PT Intensity: Minimum of 1-2 x/day ,45 to 90 minutes PT Frequency: 5 out of 7 days PT Duration Estimated Length of Stay: 27-30 days OT Intensity: Minimum of 1-2 x/day, 45 to 90 minutes OT Frequency: 5 out of 7 days OT Duration/Estimated Length of Stay: 27-30 days SLP Intensity: Minumum of 1-2 x/day, 30 to 90 minutes SLP Frequency: 3 to 5 out of 7 days SLP  Duration/Estimated Length of Stay: 27-30 days   Due to the current state of emergency, patients may not be receiving their 3-hours of Medicare-mandated therapy.   Team Interventions: Nursing Interventions Bladder Management, Patient/Family Education, Bowel Management, Disease Management/Prevention, Skin Care/Wound Management, Discharge Planning, Dysphagia/Aspiration Precaution Training, Medication Management, Psychosocial Support, Cognitive Remediation/Compensation  PT interventions Ambulation/gait training, Community reintegration, DME/adaptive equipment instruction, Neuromuscular re-education, Psychosocial support, Stair training, Wheelchair propulsion/positioning, UE/LE Strength taining/ROM, UE/LE Coordination activities, Therapeutic Activities, Skin care/wound management, Pain management, Functional electrical stimulation, Discharge planning, Balance/vestibular training, Cognitive remediation/compensation, Disease management/prevention, Patient/family education, Splinting/orthotics, Functional mobility training, Therapeutic Exercise, Visual/perceptual remediation/compensation  OT Interventions Balance/vestibular training, Discharge planning, Functional electrical stimulation, Pain management, Self Care/advanced ADL retraining, Therapeutic Activities, UE/LE Coordination activities, Cognitive remediation/compensation, Disease mangement/prevention, Functional mobility training, Patient/family education, Skin care/wound managment, Therapeutic Exercise, Visual/perceptual remediation/compensation, Academic librarian, Engineer, drilling, Neuromuscular re-education, Psychosocial support, Splinting/orthotics, UE/LE Strength taining/ROM, Wheelchair propulsion/positioning  SLP Interventions Cognitive remediation/compensation, English as a second language teacher, Environmental controls, Dysphagia/aspiration precaution training, Internal/external aids, Functional tasks, Patient/family education  TR Interventions     SW/CM Interventions Discharge Planning, Psychosocial Support, Patient/Family Education   Barriers to Discharge MD  Medical stability  Nursing      PT Inaccessible home environment, Medical stability, Decreased caregiver support, Home environment access/layout, IV antibiotics, Incontinence, Wound Care, Insurance for SNF coverage, Weight    OT      SLP      SW       Team Discharge Planning: Destination: PT-Home ,OT- Home , SLP-Home Projected Follow-up: PT-Home health PT,  OT-  Home health OT, 24 hour supervision/assistance, SLP-Home Health SLP, 24 hour supervision/assistance Projected Equipment Needs: PT-Wheelchair (measurements), Wheelchair cushion (measurements), Rolling walker with 5" wheels, To be determined, OT- 3 in 1 bedside comode, Tub/shower bench, SLP-None recommended by SLP Equipment Details: PT- , OT-  Patient/family involved in discharge planning: PT- Patient,  OT-Patient, SLP-Patient unable/family or caregive not available  MD ELOS: 27-30 days [facility] Medical Rehab Prognosis:  Excellent Assessment: Mrs. Roddy is a 59 year old woman admitted to CIR with left hemiplegia with right gaze preference, difficulty tracking beyond midline, and limited verbal output secondary to large right MCA infarct. She is on Lovenox for DVT prophylaxis. Labs are being monitored weekly and vital signs three times per day. Blood pressure is currently well controlled. She is on a dysphagia diet with TF at night. She has azotemia secondary to her dysphagia and has been treated with normal saline. She is on Ambien prn for insomnia. She continues on Pulmicort for her COPD and we are encouraging incentive spirometer use.    See Team Conference Notes for weekly updates to the plan of care

## 2020-01-11 NOTE — Progress Notes (Signed)
Inpatient Rehabilitation Care Coordinator Assessment and Plan  Patient Details  Name: Anne [name] MRN: [id number] Date of Birth: 1960-11-22  Today's Date: 01/11/2020  Problem List:  Patient Active Problem List   Diagnosis Date Noted  . Acute right MCA stroke ([place]) 01/08/2020  . Acute lower UTI   . Left hemiplegia ([place])   . Acute ischemic stroke ([place])   . Dysphagia, post-stroke   . Hyperglycemia   . Chronic obstructive pulmonary disease ([place])   . Acute respiratory failure with hypoxia ([place])   . Stroke (cerebrum) ([place]) 12/31/2019  . CAD (coronary artery disease)   . Polysubstance abuse ([place])   . Hypertension   . Asthma   . Coronary vasospasm ([place])   . NSTEMI (non-ST elevated myocardial infarction) ([place]) 11/25/2014  . Chest pain 11/24/2014   Past Medical History:  Past Medical History:  Diagnosis Date  . Asthma   . CAD (coronary artery disease)    a. cardiac cath 02/04/2013: tubular 20% stenosis pRCA, tubular 30% stenosis mRCA, medically managed  . Coronary vasospasm ([facility])    a. cardiac cath 11/25/2014: mLCx 30%, pRCA 30%, moderate spasm w/ noted improvement w/ reimaging, recommended CCB, long acting nitrate, smoking cessation, & statin   . Hypertension   . Polysubstance abuse ([place])    a. remote history of crack/cocaine abuse approximately 8-10 years ago, ongoing tobacco abuse since age 59, rare etoh abuse  . Tubular adenoma    Past Surgical History:  Past Surgical History:  Procedure Laterality Date  . CARDIAC CATHETERIZATION N/A 11/25/2014   Procedure: Left Heart Cath and Coronary Angiography;  Surgeon: Minna Merritts, MD;  Location: [facility];  Service: Cardiovascular;  Laterality: N/A;  . IR CT HEAD LTD  12/31/2019  . IR PERCUTANEOUS ART THROMBECTOMY/INFUSION INTRACRANIAL INC DIAG ANGIO  12/31/2019      . IR PERCUTANEOUS ART THROMBECTOMY/INFUSION INTRACRANIAL INC DIAG ANGIO  12/31/2019  . RADIOLOGY WITH ANESTHESIA Left 12/31/2019   Procedure: RADIOLOGY WITH  ANESTHESIA;  Surgeon: Radiologist, Medication, MD;  Location: [facility];  Service: Radiology;  Laterality: Left;   Social History:  reports that she has been smoking. She has a 45.00 pack-year smoking history. She has never used smokeless tobacco. She reports current alcohol use. She reports that she does not use drugs.  Family / Support Systems Marital Status: Single Children: Designer, television/film set (Daughter) Other Supports: Granddaughters 60 & 60, sister Anticipated Caregiver: Daughter and granddaughter Ability/Limitations of Caregiver: M-F 8:30-2 ( DTRS will assist ) Caregiver Availability: 24/7  Social History Preferred language: English Religion: Christian Read: Yes Write: Yes   Abuse/Neglect Abuse/Neglect Assessment Can Be Completed: Unable to assess, patient is non-responsive or altered mental status Physical Abuse: Denies Verbal Abuse: Denies Sexual Abuse: Denies Exploitation of patient/patient's resources: Denies Self-Neglect: Denies  Emotional Status [name] affect, behavior and adjustment status: no Recent Psychosocial Issues: no Psychiatric History: no Substance Abuse History: no  Patient / Family Perceptions, Expectations & Goals Pt/Family understanding of illness & functional limitations: Yes Pt/family expectations/goals: Goal to dishcarge home with family to provide care  [employer]: None Premorbid Home Care/DME Agencies: None Transportation available at discharge: daughter able to transport  Discharge Planning Living Arrangements: Children, Other relatives, Other (Comment) Support Systems: Children, Other relatives, Other (Comment) Type of Residence: Private residence (2 steps) Care Coordinator Anticipated Follow Up Needs: HH/OP  Clinical Impression Sw entered room, patient asleep. Completed assessment via telephone with daughter [name]. SW introduced self, explained role and process. Sw will continue to follow  up with questions and  concerns.  [name] 01/11/2020, 12:42 PM

## 2020-01-11 NOTE — Progress Notes (Signed)
Inpatient Rehabilitation  Patient information reviewed and entered into eRehab system by Odell Fasching M. Breckin Savannah, M.A., CCC/SLP, PPS Coordinator.  Information including medical coding, functional ability and quality indicators will be reviewed and updated through discharge.    

## 2020-01-11 NOTE — Progress Notes (Signed)
Speech Language Pathology Daily Session Note  Patient Details  Name: Anne [name] MRN: [id number] Date of Birth: July 08, 1961  Today's Date: 01/11/2020 SLP Individual Time: 1307-1406 SLP Individual Time Calculation (min): 59 min  Short Term Goals: Week 1: SLP Short Term Goal 1 (Week 1): Pt will consume dys 1 textures and nectar thick liquids with min assist for use of swallowing precautions. SLP Short Term Goal 2 (Week 1): Pt will consume therapeutic trials of thin liquids without respiratory decompensation over 1 week prior to repeat instrumental assessment of swallowing function. SLP Short Term Goal 3 (Week 1): Pt will sustain her attention to basic functional tasks for 3-5 minute intervals with mod assist verbal cues for redirection. SLP Short Term Goal 4 (Week 1): Pt will complete basic, familiar tasks with max assist multimodal cues for functional problem solving. SLP Short Term Goal 5 (Week 1): Pt will locate items to the left of midline during basic, famliar tasks with max assist multimodal cues. SLP Short Term Goal 6 (Week 1): Pt will utilize increased vocal intensity and slow rate to achieve intelligibility at the word level with mod assist multimodal cues.  Skilled Therapeutic Interventions: Skilled ST services focused on swallow and cognitive skills. SLP facilitated PO intake of nectar thick liquids via cup and straw and dys 1 textures on lunch tray. Pt requested "water" and consumed a total of 16 oz (nectar thick liquid) during the treatment session. Pt demonstrated PO consumption of large boluses with straw and cup, noting intermittent wet/congested breathing and x2 delayed cough. Upon reviewing most recent MBS nectar thick liquids were penetrated but cleared with straw and cup sips, however the quanity was not noted and likely consuming large boluses at beside. SLP recommends small controled sips to better reflect MBS assessment. Pt required max A verbal cues for small sips and removal  of cup. Pt required cues for sustained attention during self feeding in 2-3 minute intervals. NT entered room and SLP provided education of need for small sips with cup or straw (pt preferred straw with NG tube present.) Pt was left in room with NT to continue assisting with PO intake. SLP communicated with nurse requesting observation of pt's breathing with stethoscope and if congestion is noted notify MD for possible repeat chest x-ray. Vital signs, temperature and O2 status are [facility], however pt is at a risk for compromised respiratory status due to limited mobility. ST recommends to continue skilled ST services.      Pain Pain Assessment Pain Score: 0-No pain  Therapy/Group: Individual Therapy  Lj Miyamoto  [facility] 01/11/2020, 2:16 PM

## 2020-01-11 NOTE — Progress Notes (Addendum)
[place] PHYSICAL MEDICINE & REHABILITATION PROGRESS NOTE   Subjective/Complaints: No complaints.  Brushing her teeth. Becomes tearful about her medical condition.  Severe left sided neglect.    ROS: Unable to assess due to cognition/aphasia  Objective:   No results found. Recent Labs    01/09/20 0600 01/11/20 0652  WBC 11.7* 11.0*  HGB 13.4 13.1  HCT 43.2 42.4  PLT 331 344   Recent Labs    01/09/20 0600 01/11/20 0652  NA 144 143  K 4.4 4.3  CL 107 106  CO2 24 29  GLUCOSE 146* 95  BUN 38* 36*  CREATININE 0.86 0.88  CALCIUM 10.4* 10.2    Intake/Output Summary (Last 24 hours) at 01/11/2020 1553 Last data filed at 01/11/2020 0850 Gross per 24 hour  Intake 200 ml  Output 0 ml  Net 200 ml     Physical Exam: Vital Signs Blood pressure (!) 152/65, pulse 91, temperature 98.2 F (36.8 C), temperature source Oral, resp. rate 20, SpO2 98 %.   General: Alert. No apparent distress HEENT: +NGT, severe left sided neglect Neck: Supple without JVD or lymphadenopathy Heart: Reg rate and rhythm. No murmurs rubs or gallops Chest: CTA bilaterally without wheezes, rales, or rhonchi; no distress Abdomen: Soft, non-tender, non-distended, bowel sounds positive. Extremities: No clubbing, cyanosis, or edema. Pulses are 2+ Skin: Clean and intact without signs of breakdown Neurological:     Mental Status: She is alert.     Comments: vague- hard to tell orientation- perseverattes on water Dysarthria and slow to initiate still Left inattention  Motor: RUE/RLE: 5/5 proximal distal LUE/LLE: 0/5 proximal distal No increase in tone noted  Psychiatric:     Comments: tearful affect      Assessment/Plan: 1. Functional deficits secondary to R MCA infarct with L hemiplegia, R gaze preference and L inattention as well as dysphagia  which require 3+ hours per day of interdisciplinary therapy in a comprehensive inpatient rehab setting.  Physiatrist is providing close team  supervision and 24 hour management of active medical problems listed below.  Physiatrist and rehab team continue to assess barriers to discharge/monitor patient progress toward functional and medical goals  Care Tool:  Bathing    Body parts bathed by patient: Chest, Face, Front perineal area   Body parts bathed by helper: Right arm, Left arm, Abdomen, Buttocks, Left upper leg, Right lower leg, Left lower leg     Bathing assist Assist Level: Maximal Assistance - Patient 24 - 49%     Upper Body Dressing/Undressing Upper body dressing   What is the patient wearing?: Pull over shirt    Upper body assist Assist Level: Total Assistance - Patient < 25%    Lower Body Dressing/Undressing Lower body dressing      What is the patient wearing?: Incontinence brief     Lower body assist Assist for lower body dressing: Total Assistance - Patient < 25%     Toileting Toileting    Toileting assist Assist for toileting: Total Assistance - Patient < 25%     Transfers Chair/bed transfer  Transfers assist     Chair/bed transfer assist level: Dependent - Patient 0%     Locomotion Ambulation   Ambulation assist   Ambulation activity did not occur: Safety/medical concerns          Walk 10 feet activity   Assist  Walk 10 feet activity did not occur: Safety/medical concerns        Walk 50 feet activity   Assist  Walk 50 feet with 2 turns activity did not occur: Safety/medical concerns         Walk 150 feet activity   Assist Walk 150 feet activity did not occur: Safety/medical concerns         Walk 10 feet on uneven surface  activity   Assist Walk 10 feet on uneven surfaces activity did not occur: Safety/medical concerns         Wheelchair     Assist Will patient use wheelchair at discharge?: Yes Type of Wheelchair: Manual    Wheelchair assist level: Dependent - Patient 0% Max wheelchair distance: 150    Wheelchair 50 feet with 2 turns  activity    Assist        Assist Level: Dependent - Patient 0%   Wheelchair 150 feet activity     Assist      Assist Level: Dependent - Patient 0%   Blood pressure (!) 152/65, pulse 91, temperature 98.2 F (36.8 C), temperature source Oral, resp. rate 20, SpO2 98 %.  Medical Problem List and Plan: 1.  Left hemiplegia with right gaze preference, has difficulty tracking beyond midline to the left and continues to have limited verbal output secondary to large right MCA infarct.              -patient may not shower until can participate in shower             -ELOS/Goals: 27-30 days/min a             Continue CIR 2.  Antithrombotics: -DVT/anticoagulation:  Pharmaceutical: Lovenox             -antiplatelet therapy: On ASA/Plavix 3. Pain Management: Tylenol prn. Denies pain 4. Mood: LCSW to follow for evaluation and support.              -antipsychotic agents: N/A 5. Neuropsych: This patient is not fully capable of making decisions on his own behalf. 6. Skin/Wound Care: Routine pressure relief measures 7. Fluids/Electrolytes/Nutrition: Monitor I/Os.  CMP ordered. 8. HTN: Monitor BP tid--Imdur, lisinopril, cardizem and amlodipine has been on hold. Will resume Cardizem 30 mg qid as tachycardia noted and BP still labile.  6/26- BP 150/80- Pulse 84- improved, but don't want to decrease too fast because might cause BP to go too low  6/27- BP well controlled- con't regimen              Monitor with increased mobility. 9. UTI: Completed 7 day course of ceftriaxone on today.  6/27- WBC slightly elevated to 11.7- suggest rechecking when not as dry and see if responded to ABX- didn't have urine Cx  6/28: WBC decreased to 11.0 10. COPD: Continue Pulmicort nebs bid. 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 To contact Dr. Noralee Space closer to discharge.  12.  Post stroke dysphagia: Continue dysphagia 2, nectar liquids. Change tube feeds to nights. Add water flushes.  BMP  ordered.  6/26- pt asking for water- can have Ice chips- hopefully giving some fluids will make her feel less dry    Advance diet as tolerated. 13. Insomnia?:  Will monitor sleep wake cycle. iWas on ambien and trazodone PTA? Will order Ambien prn for now.   14. Azotemia due to dysphagia  6/26- will give NS IVFs due to BUN of 38-recheck labs Monday 15. Hyperglycemia  6/26- A1c is 5.5- likely due to TFs- will d/c BGs. Not high- running 100-140s- will d/c BGs if can find an order   6/28:  well controlled. D/ced CBGs 16. Constipation  6/27- LBM 2 days ago- getting TFs/diet- not eating a lot- if no BM by tomorrow, suggest meds.   6/28: Messaged nurse to find out of patient has had BM      LOS: 3 days A FACE TO FACE EVALUATION WAS PERFORMED  Martha Clan [place] 01/11/2020, 3:53 PM    [place] PHYSICAL MEDICINE & REHABILITATION PROGRESS NOTE   Subjective/Complaints:  Pt tearful- kept asking for water- "wanted water"- explained cannot give her water or risks pneumonia- She nodded head yes when asked if she felt "dry"- has labs tomorrow to see if IVFs helped.    LBM documented 6/25- if doesn't have BM by tomorrow, needs meds.   ROS: Unable to assess due to cognition/aphasia  Objective:   No results found. Recent Labs    01/09/20 0600 01/11/20 0652  WBC 11.7* 11.0*  HGB 13.4 13.1  HCT 43.2 42.4  PLT 331 344   Recent Labs    01/09/20 0600 01/11/20 0652  NA 144 143  K 4.4 4.3  CL 107 106  CO2 24 29  GLUCOSE 146* 95  BUN 38* 36*  CREATININE 0.86 0.88  CALCIUM 10.4* 10.2    Intake/Output Summary (Last 24 hours) at 01/11/2020 1554 Last data filed at 01/11/2020 0850 Gross per 24 hour  Intake 200 ml  Output 0 ml  Net 200 ml     Physical Exam: Vital Signs Blood pressure (!) 152/65, pulse 91, temperature 98.2 F (36.8 C), temperature source Oral, resp. rate 20, SpO2 98 %.  Physical Exam Vitals and nursing note reviewed.  Constitutional:      General: pt laying  in bed- wearing glasses- taped in middle, crying/tearful, NAD    Comments: + NG - unchanged HENT:   R gaze preference  Cardiovascular: RRR Pulmonary: a little coarse, but better- good air movement,  Abdominal: soft, slightly distended; NT; hypoactive BS Musculoskeletal:     Cervical back: Normal range of motion.     Comments: No edema or tenderness in extremities  Skin:    General: Skin is warm and dry.  Neurological:     Mental Status: She is alert.     Comments: vague- hard to tell orientation- perseverattes on water Dysarthria and slow to initiate still Left inattention  Motor: RUE/RLE: 5/5 proximal distal LUE/LLE: 0/5 proximal distal No increase in tone noted  Psychiatric:     Comments: tearful affect     Assessment/Plan: 1. Functional deficits secondary to R MCA infarct with L hemiplegia, R gaze preference and L inattention as well as dysphagia  which require 3+ hours per day of interdisciplinary therapy in a comprehensive inpatient rehab setting.  Physiatrist is providing close team supervision and 24 hour management of active medical problems listed below.  Physiatrist and rehab team continue to assess barriers to discharge/monitor patient progress toward functional and medical goals  Care Tool:  Bathing    Body parts bathed by patient: Chest, Face, Front perineal area   Body parts bathed by helper: Right arm, Left arm, Abdomen, Buttocks, Left upper leg, Right lower leg, Left lower leg     Bathing assist Assist Level: Maximal Assistance - Patient 24 - 49%     Upper Body Dressing/Undressing Upper body dressing   What is the patient wearing?: Pull over shirt    Upper body assist Assist Level: Total Assistance - Patient < 25%    Lower Body Dressing/Undressing Lower body dressing      What  is the patient wearing?: Incontinence brief     Lower body assist Assist for lower body dressing: Total Assistance - Patient < 25%     Toileting Toileting     Toileting assist Assist for toileting: Total Assistance - Patient < 25%     Transfers Chair/bed transfer  Transfers assist     Chair/bed transfer assist level: Dependent - Patient 0%     Locomotion Ambulation   Ambulation assist   Ambulation activity did not occur: Safety/medical concerns          Walk 10 feet activity   Assist  Walk 10 feet activity did not occur: Safety/medical concerns        Walk 50 feet activity   Assist Walk 50 feet with 2 turns activity did not occur: Safety/medical concerns         Walk 150 feet activity   Assist Walk 150 feet activity did not occur: Safety/medical concerns         Walk 10 feet on uneven surface  activity   Assist Walk 10 feet on uneven surfaces activity did not occur: Safety/medical concerns         Wheelchair     Assist Will patient use wheelchair at discharge?: Yes Type of Wheelchair: Manual    Wheelchair assist level: Dependent - Patient 0% Max wheelchair distance: 150    Wheelchair 50 feet with 2 turns activity    Assist        Assist Level: Dependent - Patient 0%   Wheelchair 150 feet activity     Assist      Assist Level: Dependent - Patient 0%   Blood pressure (!) 152/65, pulse 91, temperature 98.2 F (36.8 C), temperature source Oral, resp. rate 20, SpO2 98 %.  Medical Problem List and Plan: 1.  Left hemiplegia with right gaze preference, has difficulty tracking beyond midline to the left and continues to have limited verbal output secondary to large right MCA infarct.              -patient may not shower until can participate in shower             -ELOS/Goals: 27-30 days/min a             Admit to CIR 2.  Antithrombotics: -DVT/anticoagulation:  Pharmaceutical: Lovenox             -antiplatelet therapy: On ASA/Plavix 3. Pain Management: Tylenol prn.  4. Mood: LCSW to follow for evaluation and support.              -antipsychotic agents: N/A 5. Neuropsych:  This patient is not fully capable of making decisions on his own behalf. 6. Skin/Wound Care: Routine pressure relief measures 7. Fluids/Electrolytes/Nutrition: Monitor I/Os.  CMP ordered. 8. HTN: Monitor BP tid--Imdur, lisinopril, cardizem and amlodipine has been on hold. Will resume Cardizem 30 mg qid as tachycardia noted and BP still labile.  6/26- BP 150/80- Pulse 84- improved, but don't want to decrease too fast because might cause BP to go too low  6/27- BP well controlled- con't regimen              Monitor with increased mobility. 9. UTI: Completed 7 day course of ceftriaxone on today.  6/27- WBC slightly elevated to 11.7- suggest rechecking when not as dry and see if responded to ABX- didn't have urine Cx 10. COPD: Continue Pulmicort nebs bid. 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 To contact Dr. Noralee Space closer to  discharge.  12.  Post stroke dysphagia: Continue dysphagia 2, nectar liquids. Change tube feeds to nights. Add water flushes.  BMP ordered.  6/26- pt asking for water- can have Ice chips- hopefully giving some fluids will make her feel less dry    Advance diet as tolerated. 13. Insomnia?:  Will monitor sleep wake cycle. iWas on ambien and trazodone PTA? Will order Ambien prn for now.   14. Azotemia due to dysphagia  6/26- will give NS IVFs due to BUN of 38-recheck labs Monday 15. Hyperglycemia  6/26- A1c is 5.5- likely due to TFs- will d/c BGs. Not high- running 100-140s- will d/c BGs if can find an order  16. Constipation  6/27- LBM 2 days ago- getting TFs/diet- not eating a lot- if no BM by tomorrow, suggest meds.   6/28: No BM. Started daily colace. Already has Senna ordered.  18. Chest congestion: Worsening as per SLP. Ordered chest XR.     LOS: 3 days A FACE TO FACE EVALUATION WAS PERFORMED  Clide Deutscher Raeli Wiens 01/11/2020, 3:54 PM

## 2020-01-12 ENCOUNTER — Inpatient Hospital Stay (HOSPITAL_COMMUNITY): Payer: Medicaid Other | Admitting: Speech Pathology

## 2020-01-12 ENCOUNTER — Inpatient Hospital Stay (HOSPITAL_COMMUNITY): Payer: Medicaid Other | Admitting: Physical Therapy

## 2020-01-12 ENCOUNTER — Inpatient Hospital Stay (HOSPITAL_COMMUNITY): Payer: Medicaid Other | Admitting: Occupational Therapy

## 2020-01-12 LAB — GLUCOSE, CAPILLARY
Glucose-Capillary: 103 mg/dL — ABNORMAL HIGH (ref 70–99)
Glucose-Capillary: 96 mg/dL (ref 70–99)
Glucose-Capillary: 134 mg/dL — ABNORMAL HIGH (ref 70–99)
Glucose-Capillary: 105 mg/dL — ABNORMAL HIGH (ref 70–99)

## 2020-01-12 MED ORDER — DOCUSATE SODIUM 50 MG/5ML PO LIQD
100.0000 mg | Freq: Two times a day (BID) | ORAL | Status: DC
  Administered 2020-01-12 – 2020-01-13 (×3): 100 mg via ORAL
  Filled 2020-01-12 (×2): qty 10

## 2020-01-12 MED ORDER — RESOURCE THICKENUP CLEAR PO POWD: ORAL | Status: DC | PRN

## 2020-01-12 MED ORDER — RESOURCE THICKENUP CLEAR PO POWD
ORAL | Status: DC | PRN
  Filled 2020-01-12: qty 125

## 2020-01-12 NOTE — Progress Notes (Signed)
Physical Therapy Session Note  Patient Details  Name: Jaylinn Hellenbrand MRN: [id number] Date of Birth: 1961/01/23  Today's Date: 01/12/2020 PT Individual Time: 0910-1008 PT Individual Time Calculation (min): 58 min   Short Term Goals: Week 1:  PT Short Term Goal 1 (Week 1): Pt will sit EOB with min assist x 5 mintues PT Short Term Goal 2 (Week 1): Pt will initiate gait training PT Short Term Goal 3 (Week 1): Pt will transfer to and from [facility] with mod assist +2 using SB. PT Short Term Goal 4 (Week 1): Pt will perform bed mobility with max assist of 1  Skilled Therapeutic Interventions/Progress Updates:  Pt presented in bed awake and agreeable to therapy. Pt denies pain during session. PTA obtained TIS to attempt to increase pt's OOB tolerance between sessions. Upon initiating bed mobility to check brief pt stated that brief was "wet" when asked if she was aware that she was wet pt stated yes. PTA provided edu regarding use of call button to have nsg clean pt to prevent skin breakdown. Pt then performed rolling maxA x 2 to L and dependent x 2 to R for peri-care and to don brief/pants total A. Pt performed supine to sit EOB maxA x 2 with PTA providing max cues for sequencing and to use RUE to push on bed rail to assist.Upon sitting EOB pt required maxA for sitting balance however was able to improve to minA with short bouts of CGA. Pt noted to have L lateral lean with R gaze preference. Pt was able to correct with multimodal cues however was unable to maintain looking midline for more than 5-10 seconds. Perormed STS at [facility] height with maxA x 2 and noted delayed initiation when attempting to stand and when cued to sit in TIS. Pt transported to hallway of rehab gym and performed STS maxA x 1 with +2 present for safety. Using mirror feedback and max facilitation pt able to weight shift to L and take x 1 step. Pt however was unable to maintain looking forward for more than a few moments. Pt took an additional x  2 steps total A with PTA advancing LLE, blocking knee, and facilitating wt shift to L. Pt returned to TIS total A. Pt transported back to room and remained in TIS at end of session with belt alarm on, call bell within reach and current needs met.     Therapy Documentation Precautions:  Precautions Precautions: Other (comment) Precaution Comments: L hemi Restrictions Weight Bearing Restrictions: No General:   Vital Signs: Therapy Vitals Temp: 98.3 F (36.8 C) Pulse Rate: (!) 102 Resp: 17 BP: (!) 156/70 Patient Position (if appropriate): Lying Oxygen Therapy SpO2: 99 % O2 Device: Room Air Pain:   Mobility:   Locomotion :    Trunk/Postural Assessment :    Balance:   Exercises:   Other Treatments:      Therapy/Group: Individual Therapy  Aldine Chakraborty 01/12/2020, 4:41 PM

## 2020-01-12 NOTE — Progress Notes (Signed)
[place] PHYSICAL MEDICINE & REHABILITATION PROGRESS NOTE   Subjective/Complaints: No complaints this morning. Laughing while watching TV. Severe left sided neglect CXR from 6/28 stable- shows atelectasis.   ROS: Unable to assess due to cognition/aphasia  Objective:   DG Chest 2 View  Result Date: 01/11/2020 CLINICAL DATA:  Congestion. EXAM: CHEST - 2 VIEW COMPARISON:  January 04, 2020 FINDINGS: The enteric tube extends below the left hemidiaphragm. There is elevation of the right hemidiaphragm. The heart size is stable. There is no focal infiltrate or pneumothorax. There may be a small amount of atelectasis at the lung bases bilaterally. IMPRESSION: No active cardiopulmonary disease. Electronically Signed   By: Constance Holster M.D.   On: 01/11/2020 21:43   Recent Labs    01/11/20 0652  WBC 11.0*  HGB 13.1  HCT 42.4  PLT 344   Recent Labs    01/11/20 0652  NA 143  K 4.3  CL 106  CO2 29  GLUCOSE 95  BUN 36*  CREATININE 0.88  CALCIUM 10.2    Intake/Output Summary (Last 24 hours) at 01/12/2020 0839 Last data filed at 01/12/2020 0830 Gross per 24 hour  Intake 1220 ml  Output 0 ml  Net 1220 ml     Physical Exam: Vital Signs Blood pressure (!) 163/75, pulse 85, temperature 98.6 F (37 C), temperature source Oral, resp. rate 18, SpO2 96 %. General: Alert and oriented x 3, No apparent distress HEENT:  +NGT, severe left sided neglect Neck: Supple without JVD or lymphadenopathy Heart: Reg rate and rhythm. No murmurs rubs or gallops Chest: CTA bilaterally without wheezes, rales, or rhonchi; no distress Abdomen: Soft, non-tender, non-distended, bowel sounds positive. Extremities: No clubbing, cyanosis, or edema. Pulses are 2+ Skin: Clean and intact without signs of breakdown Neurological:     Mental Status: She is alert.     Comments: vague- hard to tell orientation- perseverattes on water Dysarthria and slow to initiate still Left inattention  Motor: RUE/RLE: 5/5  proximal distal LUE/LLE: 0/5 proximal distal No increase in tone noted  Psychiatric:     Comments: no longer tearful, laughing while watching TV.  Assessment/Plan: 1. Functional deficits secondary to R MCA infarct with L hemiplegia, R gaze preference and L inattention as well as dysphagia  which require 3+ hours per day of interdisciplinary therapy in a comprehensive inpatient rehab setting.  Physiatrist is providing close team supervision and 24 hour management of active medical problems listed below.  Physiatrist and rehab team continue to assess barriers to discharge/monitor patient progress toward functional and medical goals  Care Tool:  Bathing    Body parts bathed by patient: Chest, Face, Front perineal area   Body parts bathed by helper: Right arm, Left arm, Abdomen, Buttocks, Left upper leg, Right lower leg, Left lower leg     Bathing assist Assist Level: Maximal Assistance - Patient 24 - 49%     Upper Body Dressing/Undressing Upper body dressing   What is the patient wearing?: Pull over shirt    Upper body assist Assist Level: Total Assistance - Patient < 25%    Lower Body Dressing/Undressing Lower body dressing      What is the patient wearing?: Incontinence brief     Lower body assist Assist for lower body dressing: Total Assistance - Patient < 25%     Toileting Toileting    Toileting assist Assist for toileting: Total Assistance - Patient < 25%     Transfers Chair/bed transfer  Transfers assist  Chair/bed transfer assist level: Dependent - mechanical lift     Locomotion Ambulation   Ambulation assist   Ambulation activity did not occur: Safety/medical concerns          Walk 10 feet activity   Assist  Walk 10 feet activity did not occur: Safety/medical concerns        Walk 50 feet activity   Assist Walk 50 feet with 2 turns activity did not occur: Safety/medical concerns         Walk 150 feet activity   Assist  Walk 150 feet activity did not occur: Safety/medical concerns         Walk 10 feet on uneven surface  activity   Assist Walk 10 feet on uneven surfaces activity did not occur: Safety/medical concerns         Wheelchair     Assist Will patient use wheelchair at discharge?: Yes Type of Wheelchair: Manual    Wheelchair assist level: Dependent - Patient 0% Max wheelchair distance: 150    Wheelchair 50 feet with 2 turns activity    Assist        Assist Level: Dependent - Patient 0%   Wheelchair 150 feet activity     Assist      Assist Level: Dependent - Patient 0%   Blood pressure (!) 163/75, pulse 85, temperature 98.6 F (37 C), temperature source Oral, resp. rate 18, SpO2 96 %.  Medical Problem List and Plan: 1.  Left hemiplegia with right gaze preference, has difficulty tracking beyond midline to the left and continues to have limited verbal output secondary to large right MCA infarct.              -patient may not shower until can participate in shower             -ELOS/Goals: 27-30 days/min a             Continue CIR 2.  Antithrombotics: -DVT/anticoagulation:  Pharmaceutical: Lovenox             -antiplatelet therapy: On ASA/Plavix 3. Pain Management: Tylenol prn. Denies pain 4. Mood: LCSW to follow for evaluation and support.              -antipsychotic agents: N/A 5. Neuropsych: This patient is not fully capable of making decisions on his own behalf. 6. Skin/Wound Care: Routine pressure relief measures 7. Fluids/Electrolytes/Nutrition: Monitor I/Os.  CMP ordered. 8. HTN: Monitor BP tid--Imdur, lisinopril, cardizem and amlodipine has been on hold. Will resume Cardizem 30 mg qid as tachycardia noted and BP still labile.  6/26- BP 150/80- Pulse 84- improved, but don't want to decrease too fast because might cause BP to go too low  6/27- BP well controlled- con't regimen              Monitor with increased mobility. 9. UTI: Completed 7 day course  of ceftriaxone on today.  6/27- WBC slightly elevated to 11.7- suggest rechecking when not as dry and see if responded to ABX- didn't have urine Cx  6/28: WBC decreased to 11.0 10. COPD: Continue Pulmicort nebs bid. 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 To contact Dr. Noralee Space closer to discharge.  12.  Post stroke dysphagia: Continue dysphagia 2, nectar liquids. Change tube feeds to nights. Add water flushes.  BMP ordered.  6/26- pt asking for water- can have Ice chips- hopefully giving some fluids will make her feel less dry    Advance diet as tolerated. 13. Insomnia?:  Will monitor sleep wake cycle. iWas on ambien and trazodone PTA? Will order Ambien prn for now.   14. Azotemia due to dysphagia  6/26- will give NS IVFs due to BUN of 38-recheck labs Monday 15. Hyperglycemia  6/26- A1c is 5.5- likely due to TFs- will d/c BGs. Not high- running 100-140s- will d/c BGs if can find an order   6/28: well controlled. D/ced CBGs 16. Constipation  6/27- LBM 2 days ago- getting TFs/diet- not eating a lot- if no BM by tomorrow, suggest meds.   6/28: Messaged nurse to find out of patient has had BM      LOS: 4 days A FACE TO FACE EVALUATION WAS PERFORMED  Izora Ribas 01/12/2020, 8:39 AM    [place] PHYSICAL MEDICINE & REHABILITATION PROGRESS NOTE   Subjective/Complaints:  Pt tearful- kept asking for water- "wanted water"- explained cannot give her water or risks pneumonia- She nodded head yes when asked if she felt "dry"- has labs tomorrow to see if IVFs helped.    LBM documented 6/25- if doesn't have BM by tomorrow, needs meds.   ROS: Unable to assess due to cognition/aphasia  Objective:   DG Chest 2 View  Result Date: 01/11/2020 CLINICAL DATA:  Congestion. EXAM: CHEST - 2 VIEW COMPARISON:  January 04, 2020 FINDINGS: The enteric tube extends below the left hemidiaphragm. There is elevation of the right hemidiaphragm. The heart size is stable. There is no focal  infiltrate or pneumothorax. There may be a small amount of atelectasis at the lung bases bilaterally. IMPRESSION: No active cardiopulmonary disease. Electronically Signed   By: Constance Holster M.D.   On: 01/11/2020 21:43   Recent Labs    01/11/20 0652  WBC 11.0*  HGB 13.1  HCT 42.4  PLT 344   Recent Labs    01/11/20 0652  NA 143  K 4.3  CL 106  CO2 29  GLUCOSE 95  BUN 36*  CREATININE 0.88  CALCIUM 10.2    Intake/Output Summary (Last 24 hours) at 01/12/2020 0839 Last data filed at 01/12/2020 0830 Gross per 24 hour  Intake 1220 ml  Output 0 ml  Net 1220 ml     Physical Exam: Vital Signs Blood pressure (!) 163/75, pulse 85, temperature 98.6 F (37 C), temperature source Oral, resp. rate 18, SpO2 96 %.  Physical Exam Vitals and nursing note reviewed.  Constitutional:      General: pt laying in bed- wearing glasses- taped in middle, crying/tearful, NAD    Comments: + NG - unchanged HENT:   R gaze preference  Cardiovascular: RRR Pulmonary: a little coarse, but better- good air movement,  Abdominal: soft, slightly distended; NT; hypoactive BS Musculoskeletal:     Cervical back: Normal range of motion.     Comments: No edema or tenderness in extremities  Skin:    General: Skin is warm and dry.  Neurological:     Mental Status: She is alert.     Comments: vague- hard to tell orientation- perseverattes on water Dysarthria and slow to initiate still Left inattention  Motor: RUE/RLE: 5/5 proximal distal LUE/LLE: 0/5 proximal distal No increase in tone noted  Psychiatric:     Comments: tearful affect     Assessment/Plan: 1. Functional deficits secondary to R MCA infarct with L hemiplegia, R gaze preference and L inattention as well as dysphagia  which require 3+ hours per day of interdisciplinary therapy in a comprehensive inpatient rehab setting.  Physiatrist is providing close team supervision and  24 hour management of active medical problems listed  below.  Physiatrist and rehab team continue to assess barriers to discharge/monitor patient progress toward functional and medical goals  Care Tool:  Bathing    Body parts bathed by patient: Chest, Face, Front perineal area   Body parts bathed by helper: Right arm, Left arm, Abdomen, Buttocks, Left upper leg, Right lower leg, Left lower leg     Bathing assist Assist Level: Maximal Assistance - Patient 24 - 49%     Upper Body Dressing/Undressing Upper body dressing   What is the patient wearing?: Pull over shirt    Upper body assist Assist Level: Total Assistance - Patient < 25%    Lower Body Dressing/Undressing Lower body dressing      What is the patient wearing?: Incontinence brief     Lower body assist Assist for lower body dressing: Total Assistance - Patient < 25%     Toileting Toileting    Toileting assist Assist for toileting: Total Assistance - Patient < 25%     Transfers Chair/bed transfer  Transfers assist     Chair/bed transfer assist level: Dependent - mechanical lift     Locomotion Ambulation   Ambulation assist   Ambulation activity did not occur: Safety/medical concerns          Walk 10 feet activity   Assist  Walk 10 feet activity did not occur: Safety/medical concerns        Walk 50 feet activity   Assist Walk 50 feet with 2 turns activity did not occur: Safety/medical concerns         Walk 150 feet activity   Assist Walk 150 feet activity did not occur: Safety/medical concerns         Walk 10 feet on uneven surface  activity   Assist Walk 10 feet on uneven surfaces activity did not occur: Safety/medical concerns         Wheelchair     Assist Will patient use wheelchair at discharge?: Yes Type of Wheelchair: Manual    Wheelchair assist level: Dependent - Patient 0% Max wheelchair distance: 150    Wheelchair 50 feet with 2 turns activity    Assist        Assist Level: Dependent -  Patient 0%   Wheelchair 150 feet activity     Assist      Assist Level: Dependent - Patient 0%   Blood pressure (!) 163/75, pulse 85, temperature 98.6 F (37 C), temperature source Oral, resp. rate 18, SpO2 96 %.  Medical Problem List and Plan: 1.  Left hemiplegia with right gaze preference, has difficulty tracking beyond midline to the left and continues to have limited verbal output secondary to large right MCA infarct.              -patient may not shower until can participate in shower             -ELOS/Goals: 27-30 days/min a             Continue CIR 2.  Antithrombotics: -DVT/anticoagulation:  Pharmaceutical: Lovenox             -antiplatelet therapy: On ASA/Plavix 3. Pain Management: Tylenol prn.  4. Mood: LCSW to follow for evaluation and support.              -antipsychotic agents: N/A 5. Neuropsych: This patient is not fully capable of making decisions on his own behalf. 6. Skin/Wound Care: Routine pressure relief measures 7. Fluids/Electrolytes/Nutrition:  Monitor I/Os.  CMP ordered. 8. HTN: Monitor BP tid--Imdur, lisinopril, cardizem and amlodipine has been on hold. Will resume Cardizem 30 mg qid as tachycardia noted and BP still labile.  6/26- BP 150/80- Pulse 84- improved, but don't want to decrease too fast because might cause BP to go too low  6/27- BP well controlled- con't regimen              Monitor with increased mobility. 9. UTI: Completed 7 day course of ceftriaxone on today.  6/27- WBC slightly elevated to 11.7- suggest rechecking when not as dry and see if responded to ABX- didn't have urine Cx 10. COPD: Continue Pulmicort nebs bid. 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 To contact Dr. Noralee Space closer to discharge.  12.  Post stroke dysphagia: Continue dysphagia 2, nectar liquids. Change tube feeds to nights. Add water flushes.  BMP ordered.  6/26- pt asking for water- can have Ice chips- hopefully giving some fluids will make her feel less dry     Advance diet as tolerated. 13. Insomnia?:  Will monitor sleep wake cycle. iWas on ambien and trazodone PTA? Will order Ambien prn for now.   14. Azotemia due to dysphagia  6/26- will give NS IVFs due to BUN of 38-recheck labs Monday 15. Hyperglycemia  6/26- A1c is 5.5- likely due to TFs- will d/c BGs. Not high- running 100-140s- will d/c BGs if can find an order   6/29: CBGs stable- have d/ced CBG order.  16. Constipation  6/27- LBM 2 days ago- getting TFs/diet- not eating a lot- if no BM by tomorrow, suggest meds.   6/28: No BM. Started daily colace. Already has Senna ordered.   6/29: Patient reports no BM since 6/25. Belly does not feel uncomfortable. Will increase colace to BID. 18. Chest congestion: Worsening as per SLP. Ordered chest XR.   6/29: appears stable this morning. CXR is stable and shows atelectasis. Will order incentive spirometer for her. Discussed results with patient.    LOS: 4 days A FACE TO FACE EVALUATION WAS PERFORMED  Izora Ribas 01/12/2020, 8:39 AM

## 2020-01-12 NOTE — Plan of Care (Signed)
  Problem: Consults Goal: RH STROKE PATIENT EDUCATION Description: See Patient Education module for education specifics  Outcome: Progressing   Problem: RH BOWEL ELIMINATION Goal: RH STG MANAGE BOWEL WITH ASSISTANCE Description: STG Manage Bowel with  [place]. Outcome: Progressing   Problem: RH BLADDER ELIMINATION Goal: RH STG MANAGE BLADDER WITH ASSISTANCE Description: STG Manage Bladder With  Min Assistance  Outcome: Progressing   Problem: RH SKIN INTEGRITY Goal: RH STG SKIN FREE OF INFECTION/BREAKDOWN Description: Free of breakdown with min assist Outcome: Progressing   Problem: RH SAFETY Goal: RH STG ADHERE TO SAFETY PRECAUTIONS W/ASSISTANCE/DEVICE Description: STG Adhere to Safety Precautions With Min Assistance/Device.  Outcome: Progressing   Problem: RH COGNITION-NURSING Goal: RH STG USES MEMORY AIDS/STRATEGIES W/ASSIST TO PROBLEM SOLVE Description: STG Uses Memory Aids/Strategies With Min Assistance to Problem Solve.  Outcome: Progressing   Problem: RH KNOWLEDGE DEFICIT Goal: RH STG INCREASE KNOWLEDGE OF HYPERTENSION Description: Patient/family will be able to describe how to manage hypertension with cues/handouts Outcome: Progressing Goal: RH STG INCREASE KNOWLEDGE OF DYSPHAGIA/FLUID INTAKE Description: Patient/family will be able to describe safe diet and swallowing techniques with cues handouts Outcome: Progressing Goal: RH STG INCREASE KNOWLEDGE OF STROKE PROPHYLAXIS Description: Patient/family will be able to describe how to prevent stroke including diet and medication management with cues/handouts Outcome: Progressing

## 2020-01-12 NOTE — Progress Notes (Signed)
Occupational Therapy Session Note  Patient Details  Name: Anne [name] MRN: [id number] Date of Birth: Aug 16, 1960  Today's Date: 01/12/2020 OT Individual Time: 1100-1200 and 1400-1425 OT Individual Time Calculation (min): 60 min and 25 mins   Short Term Goals: Week 1:  OT Short Term Goal 1 (Week 1): Pt will complete sit<>stand for ADL with max A. OT Short Term Goal 2 (Week 1): Pt will locate 3 self-care items on L side with mod cues. OT Short Term Goal 3 (Week 1): Pt will don shirt with mod A using hemi dressing techniques. OT Short Term Goal 4 (Week 1): Pt will demonstrate static sitting balance for 30+ seconds with CGA.  Skilled Therapeutic Interventions/Progress Updates:    Session 1:Upon entering the room, pt seated in tilt in space wheelchair and appeared very lethargic. Pt has been up since 9 am PT session. Pt is agreeable to attempt toileting and reports need to use bathroom once asked. Pt standing from wheelchair > stedy with +2 assist. Pt assisted to bathroom via stedy. +2 assistance to stand from elevated surface and onto commode. [name] brief was wet but she also was able to void urine and small BM once seated on commode. Pt needing +2 assistance to stand from toilet with total A for hygiene and clothing management. Pt began pushing with grab bar in bathroom and then with bar of stedy towards the L. Pt very fatigued at this point and assisted back to bed via stedy. +2 assistance needed for sit >supine and to reposition in bed. Pt requesting ice chips and performed oral care with min A and use of suction toothbrush. Pt reaching into cup at midline to scoop ice onto spoon and bring to mouth. Pt remained in bed at end of session with call bell and all needed items within reach. Bed alarm activated.   Session 2: Upon entering the room, pt supine in bed and sleeping soundly. Pt does move gaze to midline with mod - max cuing this session. Pt is agreeable to attempting toileting again as it has  been several hours since she went and pants appear to be wet. Supine >sit with +2 assistance. Static sitting balance for ~ 2 minutes with initially mod A progressing to CGA for safety. Pt standing into stedy with +2 assist. Pt required total A for clothing management this session with brief and pants soiled with urine but she was unaware. Pt having small BM with assistance for hygiene in standing. Pt needing max multimodal cuing for hand placement and to attend to L side. Pt pushing at end of session and returned back to bed secondary to fatigue. Sit >supine with +2 assistance. Hemiplegic positioning for safety. Bed alarm activated and call bell within reach.   Therapy Documentation Precautions:  Precautions Precautions: Other (comment) Precaution Comments: L hemi Restrictions Weight Bearing Restrictions: No   Pain: Pain Assessment Pain Scale: 0-10 Pain Score: 0-No pain Faces Pain Scale: No hurt   Therapy/Group: Individual Therapy  [name] 01/12/2020, 12:26 PM

## 2020-01-12 NOTE — Progress Notes (Signed)
Inpatient Rehabilitation Medication Review by a Pharmacist  A complete drug regimen review was completed for this patient to identify any potential clinically significant medication issues.  Clinically significant medication issues were identified:  no   Pharmacist comments:   Time spent performing this drug regimen review (minutes):  10   Bertis Ruddy 01/12/2020 7:08 AM

## 2020-01-12 NOTE — Progress Notes (Signed)
Speech Language Pathology Daily Session Note  Patient Details  Name: Anne [name] MRN: [id number] Date of Birth: January 06, 1961  Today's Date: 01/12/2020 SLP Individual Time: 0731-0826 SLP Individual Time Calculation (min): 55 min  Short Term Goals: Week 1: SLP Short Term Goal 1 (Week 1): Pt will consume dys 1 textures and nectar thick liquids with min assist for use of swallowing precautions. SLP Short Term Goal 2 (Week 1): Pt will consume therapeutic trials of thin liquids without respiratory decompensation over 1 week prior to repeat instrumental assessment of swallowing function. SLP Short Term Goal 3 (Week 1): Pt will sustain her attention to basic functional tasks for 3-5 minute intervals with mod assist verbal cues for redirection. SLP Short Term Goal 4 (Week 1): Pt will complete basic, familiar tasks with max assist multimodal cues for functional problem solving. SLP Short Term Goal 5 (Week 1): Pt will locate items to the left of midline during basic, famliar tasks with max assist multimodal cues. SLP Short Term Goal 6 (Week 1): Pt will utilize increased vocal intensity and slow rate to achieve intelligibility at the word level with mod assist multimodal cues.  Skilled Therapeutic Interventions: Pt was seen for skilled ST targeting dysphagia and cognition. Pt required Mod A verbal cues for use of smaller sips when consuming nectar thick liquids from her breakfast tray. Immediate cough noted X2, however pt also with mild congested cough at baseline. [name] solid PO intake somewhat limited, but mastication and oral clearance was efficient and without overt s/sx aspiration. Verbal cue X1 required for oral clearance of puree eggs. Recommend continue current diet.   Pt required overall Mod A verbal and visual cues for basic functional problem solving and sustained attention to functional tasks in ~3 minute intervals during functional tasks and self feeding. Max A requiring for locating items on her  left side and for problem solving how to use phone to receive call from family member during session. She did locate 1 item on her breakfast tray on far left side without cueing. During a basic card sorting task (by color), pt initially required cues for error awareness, that could be faded out but Max A multimodal cues were required for task initiation and sustained attention throughout. Of note, pt with increasing fatigue throughout session, requiring Max A cueing for arousal for last 15 minutes. Pt left laying in bed with alarm set and needs within reach. Continue per current plan of care.        Pain Pain Assessment Pain Scale: 0-10 Pain Score: 0-No pain Faces Pain Scale: No hurt  Therapy/Group: Individual Therapy  Arbutus Leas 01/12/2020, 6:58 AM

## 2020-01-13 ENCOUNTER — Inpatient Hospital Stay (HOSPITAL_COMMUNITY): Payer: Medicaid Other | Admitting: Occupational Therapy

## 2020-01-13 ENCOUNTER — Inpatient Hospital Stay (HOSPITAL_COMMUNITY): Payer: Medicaid Other | Admitting: Speech Pathology

## 2020-01-13 ENCOUNTER — Inpatient Hospital Stay (HOSPITAL_COMMUNITY): Payer: Medicaid Other | Admitting: *Deleted

## 2020-01-13 ENCOUNTER — Inpatient Hospital Stay (HOSPITAL_COMMUNITY): Payer: Medicaid Other

## 2020-01-13 LAB — GLUCOSE, CAPILLARY: Glucose-Capillary: 124 mg/dL — ABNORMAL HIGH (ref 70–99)

## 2020-01-13 MED ORDER — METHYLPHENIDATE HCL 5 MG PO TABS
5.0000 mg | ORAL_TABLET | Freq: Two times a day (BID) | ORAL | Status: DC
  Administered 2020-01-13 – 2020-01-16 (×6): 5 mg via ORAL
  Filled 2020-01-13 (×6): qty 1

## 2020-01-13 MED ORDER — MEGESTROL ACETATE 400 MG/10ML PO SUSP
400.0000 mg | Freq: Two times a day (BID) | ORAL | Status: DC
  Administered 2020-01-13 – 2020-01-20 (×15): 400 mg
  Filled 2020-01-13 (×15): qty 10

## 2020-01-13 MED ORDER — MUSCLE RUB 10-15 % EX CREA
1.0000 "application " | TOPICAL_CREAM | Freq: Two times a day (BID) | CUTANEOUS | Status: DC | PRN
  Administered 2020-01-13 – 2020-02-14 (×31): 1 via TOPICAL
  Filled 2020-01-13 (×4): qty 85

## 2020-01-13 NOTE — Progress Notes (Signed)
[facility] PHYSICAL MEDICINE & REHABILITATION PROGRESS NOTE   Subjective/Complaints:  Per RN has RIgh tthigh pain intermittent, difficult to get hx due to cognition, has started since CVA   ROS: Unable to assess due to cognition/aphasia  Objective:   DG Chest 2 View  Result Date: 01/11/2020 CLINICAL DATA:  Congestion. EXAM: CHEST - 2 VIEW COMPARISON:  January 04, 2020 FINDINGS: The enteric tube extends below the left hemidiaphragm. There is elevation of the right hemidiaphragm. The heart size is stable. There is no focal infiltrate or pneumothorax. There may be a small amount of atelectasis at the lung bases bilaterally. IMPRESSION: No active cardiopulmonary disease. Electronically Signed   By: Constance Holster M.D.   On: 01/11/2020 21:43   Recent Labs    01/11/20 0652  WBC 11.0*  HGB 13.1  HCT 42.4  PLT 344   Recent Labs    01/11/20 0652  NA 143  K 4.3  CL 106  CO2 29  GLUCOSE 95  BUN 36*  CREATININE 0.88  CALCIUM 10.2    Intake/Output Summary (Last 24 hours) at 01/13/2020 0910 Last data filed at 01/13/2020 0751 Gross per 24 hour  Intake 700 ml  Output 36 ml  Net 664 ml     Physical Exam: Vital Signs Blood pressure (!) 151/78, pulse 89, temperature 98.6 F (37 C), temperature source Oral, resp. rate 16, SpO2 99 %.  General: No acute distress Mood and affect are appropriate Heart: Regular rate and rhythm no rubs murmurs or extra sounds Lungs: Clear to auscultation, breathing unlabored, no rales or wheezes Abdomen: Positive bowel sounds, soft nontender to palpation, nondistended Extremities: No clubbing, cyanosis, or edema Skin: No evidence of breakdown, no evidence of rash TOne is normal RIght thigh   Motor: RUE/RLE: 5/5 proximal distal LUE/LLE: 0/5 proximal distal No increase in tone noted  Psychiatric:    no lability, slowed responses , reduced attn   Assessment/Plan: 1. Functional deficits secondary to R MCA infarct with L hemiplegia, R gaze  preference and L inattention as well as dysphagia  which require 3+ hours per day of interdisciplinary therapy in a comprehensive inpatient rehab setting.  Physiatrist is providing close team supervision and 24 hour management of active medical problems listed below.  Physiatrist and rehab team continue to assess barriers to discharge/monitor patient progress toward functional and medical goals  Care Tool:  Bathing    Body parts bathed by patient: Chest, Face, Front perineal area   Body parts bathed by helper: Right arm, Left arm, Abdomen, Buttocks, Left upper leg, Right lower leg, Left lower leg     Bathing assist Assist Level: Maximal Assistance - Patient 24 - 49%     Upper Body Dressing/Undressing Upper body dressing   What is the patient wearing?: Pull over shirt    Upper body assist Assist Level: Total Assistance - Patient < 25%    Lower Body Dressing/Undressing Lower body dressing      What is the patient wearing?: Incontinence brief     Lower body assist Assist for lower body dressing: Maximal Assistance - Patient 25 - 49%     Toileting Toileting    Toileting assist Assist for toileting: 2 Helpers     Transfers Chair/bed transfer  Transfers assist     Chair/bed transfer assist level: Dependent - mechanical lift     Locomotion Ambulation   Ambulation assist   Ambulation activity did not occur: Safety/medical concerns  Assist level: Total Assistance - Patient < 25% Assistive  device:  (wall rail) Max distance: 3 steps   Walk 10 feet activity   Assist  Walk 10 feet activity did not occur: Safety/medical concerns        Walk 50 feet activity   Assist Walk 50 feet with 2 turns activity did not occur: Safety/medical concerns         Walk 150 feet activity   Assist Walk 150 feet activity did not occur: Safety/medical concerns         Walk 10 feet on uneven surface  activity   Assist Walk 10 feet on uneven surfaces activity did  not occur: Safety/medical concerns         Wheelchair     Assist Will patient use wheelchair at discharge?: Yes Type of Wheelchair: Manual    Wheelchair assist level: Dependent - Patient 0% Max wheelchair distance: 150    Wheelchair 50 feet with 2 turns activity    Assist        Assist Level: Dependent - Patient 0%   Wheelchair 150 feet activity     Assist      Assist Level: Dependent - Patient 0%   Blood pressure (!) 151/78, pulse 89, temperature 98.6 F (37 C), temperature source Oral, resp. rate 16, SpO2 99 %.  Medical Problem List and Plan: 1.  Left hemiplegia with right gaze preference, has difficulty tracking beyond midline to the left and continues to have limited verbal output secondary to large right MCA infarct.              -patient may not shower until can participate in shower             -ELOS/Goals: 27-30 days/min a             Continue CIR PT, OT, SLP  Team conference today please see physician documentation under team conference tab, met with team  to discuss problems,progress, and goals. Formulized individual treatment plan based on medical history, underlying problem and comorbidities.  2.  Antithrombotics: -DVT/anticoagulation:  Pharmaceutical: Lovenox             -antiplatelet therapy: On ASA/Plavix 3. Pain Management: Tylenol prn. Sports creme for Right muscular thigh pain exercise related  4. Mood: LCSW to follow for evaluation and support.              -antipsychotic agents: N/A 5. Neuropsych: This patient is not fully capable of making decisions on his own behalf. 6. Skin/Wound Care: Routine pressure relief measures 7. Fluids/Electrolytes/Nutrition: Monitor I/Os.  CMP ordered. 8. HTN: Monitor BP tid--Imdur, lisinopril, cardizem and amlodipine has been on hold. Will resume Cardizem 30 mg qid as tachycardia noted and BP still labile.  6/26- BP 150/80- Pulse 84- improved, but don't want to decrease too fast because might cause BP to  go too low  6/27- BP well controlled- con't regimen              Monitor with increased mobility. 9. UTI: Resolved    10. COPD: Continue Pulmicort nebs bid. 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 To contact Dr. Noralee Space closer to discharge.  12.  Post stroke dysphagia: Continue dysphagia 2, nectar liquids. Change tube feeds to nights. Add water flushes.  BMP ordered.  6/26- pt asking for water- can have Ice chips- hopefully giving some fluids will make her feel less dry    Advance diet as tolerated. 13. Insomnia?:  Will monitor sleep wake cycle. iWas on ambien and trazodone PTA? Will order  Ambien prn for now.   14. Azotemia due to dysphagia  6/26- will give NS IVFs due to BUN of 38-recheck labs Monday 15. Hyperglycemia  Due to TF, improved  16. Constipation Resolved type 6 BM x 2 today , hold on further PRN laxative        LOS: 5 days A FACE TO FACE EVALUATION WAS PERFORMED  Charlett Blake 01/13/2020, 9:10 AM    [facility] PHYSICAL MEDICINE & REHABILITATION PROGRESS NOTE   Subjective/Complaints:  Pt tearful- kept asking for water- "wanted water"- explained cannot give her water or risks pneumonia- She nodded head yes when asked if she felt "dry"- has labs tomorrow to see if IVFs helped.    LBM documented 6/25- if doesn't have BM by tomorrow, needs meds.   ROS: Unable to assess due to cognition/aphasia  Objective:   DG Chest 2 View  Result Date: 01/11/2020 CLINICAL DATA:  Congestion. EXAM: CHEST - 2 VIEW COMPARISON:  January 04, 2020 FINDINGS: The enteric tube extends below the left hemidiaphragm. There is elevation of the right hemidiaphragm. The heart size is stable. There is no focal infiltrate or pneumothorax. There may be a small amount of atelectasis at the lung bases bilaterally. IMPRESSION: No active cardiopulmonary disease. Electronically Signed   By: Constance Holster M.D.   On: 01/11/2020 21:43   Recent Labs    01/11/20 0652  WBC 11.0*  HGB 13.1   HCT 42.4  PLT 344   Recent Labs    01/11/20 0652  NA 143  K 4.3  CL 106  CO2 29  GLUCOSE 95  BUN 36*  CREATININE 0.88  CALCIUM 10.2    Intake/Output Summary (Last 24 hours) at 01/13/2020 0910 Last data filed at 01/13/2020 0751 Gross per 24 hour  Intake 700 ml  Output 36 ml  Net 664 ml     Physical Exam: Vital Signs Blood pressure (!) 151/78, pulse 89, temperature 98.6 F (37 C), temperature source Oral, resp. rate 16, SpO2 99 %.  Physical Exam Vitals and nursing note reviewed.  Constitutional:      General: pt laying in bed- wearing glasses- taped in middle, crying/tearful, NAD    Comments: + NG - unchanged HENT:   R gaze preference  Cardiovascular: RRR Pulmonary: a little coarse, but better- good air movement,  Abdominal: soft, slightly distended; NT; hypoactive BS Musculoskeletal:     Cervical back: Normal range of motion.     Comments: No edema or tenderness in extremities  Skin:    General: Skin is warm and dry.  Neurological:     Mental Status: She is alert.     Comments: vague- hard to tell orientation- perseverattes on water Dysarthria and slow to initiate still Left inattention  Motor: RUE/RLE: 5/5 proximal distal LUE/LLE: 0/5 proximal distal No increase in tone noted  Psychiatric:     Comments: tearful affect     Assessment/Plan: 1. Functional deficits secondary to R MCA infarct with L hemiplegia, R gaze preference and L inattention as well as dysphagia  which require 3+ hours per day of interdisciplinary therapy in a comprehensive inpatient rehab setting.  Physiatrist is providing close team supervision and 24 hour management of active medical problems listed below.  Physiatrist and rehab team continue to assess barriers to discharge/monitor patient progress toward functional and medical goals  Care Tool:  Bathing    Body parts bathed by patient: Chest, Face, Front perineal area   Body parts bathed by helper: Right arm, Left  arm,  Abdomen, Buttocks, Left upper leg, Right lower leg, Left lower leg     Bathing assist Assist Level: Maximal Assistance - Patient 24 - 49%     Upper Body Dressing/Undressing Upper body dressing   What is the patient wearing?: Pull over shirt    Upper body assist Assist Level: Total Assistance - Patient < 25%    Lower Body Dressing/Undressing Lower body dressing      What is the patient wearing?: Incontinence brief     Lower body assist Assist for lower body dressing: Maximal Assistance - Patient 25 - 49%     Toileting Toileting    Toileting assist Assist for toileting: 2 Helpers     Transfers Chair/bed transfer  Transfers assist     Chair/bed transfer assist level: Dependent - mechanical lift     Locomotion Ambulation   Ambulation assist   Ambulation activity did not occur: Safety/medical concerns  Assist level: Total Assistance - Patient < 25% Assistive device:  (wall rail) Max distance: 3 steps   Walk 10 feet activity   Assist  Walk 10 feet activity did not occur: Safety/medical concerns        Walk 50 feet activity   Assist Walk 50 feet with 2 turns activity did not occur: Safety/medical concerns         Walk 150 feet activity   Assist Walk 150 feet activity did not occur: Safety/medical concerns         Walk 10 feet on uneven surface  activity   Assist Walk 10 feet on uneven surfaces activity did not occur: Safety/medical concerns         Wheelchair     Assist Will patient use wheelchair at discharge?: Yes Type of Wheelchair: Manual    Wheelchair assist level: Dependent - Patient 0% Max wheelchair distance: 150    Wheelchair 50 feet with 2 turns activity    Assist        Assist Level: Dependent - Patient 0%   Wheelchair 150 feet activity     Assist      Assist Level: Dependent - Patient 0%   Blood pressure (!) 151/78, pulse 89, temperature 98.6 F (37 C), temperature source Oral, resp. rate  16, SpO2 99 %.  Medical Problem List and Plan: 1.  Left hemiplegia with right gaze preference, has difficulty tracking beyond midline to the left and continues to have limited verbal output secondary to large right MCA infarct.              -patient may not shower until can participate in shower             -ELOS/Goals: 27-30 days/min a             Continue CIR 2.  Antithrombotics: -DVT/anticoagulation:  Pharmaceutical: Lovenox             -antiplatelet therapy: On ASA/Plavix 3. Pain Management: Tylenol prn.  4. Mood: LCSW to follow for evaluation and support.              -antipsychotic agents: N/A 5. Neuropsych: This patient is not fully capable of making decisions on his own behalf. 6. Skin/Wound Care: Routine pressure relief measures 7. Fluids/Electrolytes/Nutrition: Monitor I/Os.  CMP ordered. 8. HTN: Monitor BP tid--Imdur, lisinopril, cardizem and amlodipine has been on hold. Will resume Cardizem 30 mg qid as tachycardia noted and BP still labile.  6/26- BP 150/80- Pulse 84- improved, but don't want to decrease too fast because might  cause BP to go too low  6/27- BP well controlled- con't regimen              Monitor with increased mobility. 9. UTI: Completed 7 day course of ceftriaxone on today.  6/27- WBC slightly elevated to 11.7- suggest rechecking when not as dry and see if responded to ABX- didn't have urine Cx 10. COPD: Continue Pulmicort nebs bid. 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 To contact Dr. Noralee Space closer to discharge.  12.  Post stroke dysphagia: Continue dysphagia 2, nectar liquids. Change tube feeds to nights. Add water flushes.  BMP ordered.  6/26- pt asking for water- can have Ice chips- hopefully giving some fluids will make her feel less dry    Advance diet as tolerated. 13. Insomnia?:  Will monitor sleep wake cycle. iWas on ambien and trazodone PTA? Will order Ambien prn for now.   14. Azotemia due to dysphagia  6/26- will give NS IVFs due to BUN  of 38-recheck labs Monday 15. Hyperglycemia  6/26- A1c is 5.5- likely due to TFs- will d/c BGs. Not high- running 100-140s- will d/c BGs if can find an order   6/29: CBGs stable- have d/ced CBG order.  16. Constipation  6/27- LBM 2 days ago- getting TFs/diet- not eating a lot- if no BM by tomorrow, suggest meds.   6/28: No BM. Started daily colace. Already has Senna ordered.   6/29: Patient reports no BM since 6/25. Belly does not feel uncomfortable. Will increase colace to BID. 18. Chest congestion: Worsening as per SLP. Ordered chest XR.   6/29: appears stable this morning. CXR is stable and shows atelectasis. Will order incentive spirometer for her. Discussed results with patient.    LOS: 5 days A FACE TO FACE EVALUATION WAS PERFORMED  Charlett Blake 01/13/2020, 9:10 AM

## 2020-01-13 NOTE — Patient Care Conference (Signed)
Inpatient RehabilitationTeam Conference and Plan of Care Update Date: 01/13/2020   Time: 1:07 PM    Patient Name: Anne [name]      Medical Record Number: [id number]  Date of Birth: October 01, 1960 Sex: Female         Room/Bed: 4W18C/4W18C-01 Payor Info: Payor: MEDICAID [place] / Plan: MEDICAID [place] ACCESS / Product Type: *No Product type* /    Admit Date/Time:  01/08/2020  4:39 PM  Primary Diagnosis:  Acute right MCA stroke [facility])  Patient Active Problem List   Diagnosis Date Noted  . Acute right MCA stroke ([place]) 01/08/2020  . Acute lower UTI   . Left hemiplegia ([place])   . Acute ischemic stroke ([place])   . Dysphagia, post-stroke   . Hyperglycemia   . Chronic obstructive pulmonary disease ([place])   . Acute respiratory failure with hypoxia ([place])   . Stroke (cerebrum) ([place]) 12/31/2019  . CAD (coronary artery disease)   . Polysubstance abuse ([place])   . Hypertension   . Asthma   . Coronary vasospasm ([place])   . NSTEMI (non-ST elevated myocardial infarction) ([place]) 11/25/2014  . Chest pain 11/24/2014    Expected Discharge Date: Expected Discharge Date:  (3-3.5 weeks)  Team Members Present: Physician leading conference: Dr. Alysia Penna Care Coodinator Present: Dorien Chihuahua, RN, BSN, CRRN;[name] Sampson Goon, [place] Nurse Present: Isla Pence, RN PT Present: Barrie Folk, PT;Rosita Dechalus, PTA OT Present: Darleen Crocker, OT SLP Present: Weston Anna, SLP PPS Coordinator present : Gunnar Fusi, SLP     Current Status/Progress Goal Weekly Team Focus  Bowel/Bladder   Incontinent 62f bowel and bladder Lbm 6/29 q 4-6 pvr and q6 cath  fewer periods of incontinence  assess toileting need qshift and prn   Swallow/Nutrition/ Hydration   Dys. 1 textures with nectar-thick liquids, Mod A  Supervision  increase use of swallowing strategies, trials of upgraded textures/liquids   ADL's   total A of 2 overall  min A - will likely be downgraded to mod A  self care retraining, balance,  functional transfers, L NMR, visual scanning/inattention   Mobility   maxA x2 bed mobility, STS total A x2 in [place], maxA sitting balance, total A transfers, L inattention, total A gait at wall rail x 3 step  mod A w/c level  bed mobiliy, sitting balance, L NMR, transfers, d/c planning   Communication   Mod A  Min A  use of speech intelligibility strategies   Safety/Cognition/ Behavioral Observations  Max A  Min A  visual scanning, attention, recall and problem solving   Pain   no c/o pain  remain pain free  assess pain qshift and prn   Skin   no skin impairments  remain free of skin impairments  assess skin qhift and PRN    Rehab Goals Patient on target to meet rehab goals: Yes Rehab Goals Revised: patient on target with current goals *See Care Plan and progress notes for long and short-term goals.     Barriers to Discharge  Current Status/Progress Possible Resolutions Date Resolved   Nursing                  PT  Inaccessible home environment;Medical stability;Decreased caregiver support;Home environment access/layout;IV antibiotics;Incontinence;Wound Care;Insurance for SNF coverage;Weight                 OT                  SLP Lack of/limited family support;Decreased caregiver support  Care Coordinator Home environment access/layout 2 steps to enter home on target          Discharge Planning/Teaching Needs:  plans to discharge home with DTR and GDTRS to provide care  Will schedule closer to discharge   Team Discussion:  Muscle soreness noted; MD ordered sports cream. Dysphagia; D1-Nectar diet with poor intake and supplemental TF at HS. Megace ordered and trial ritalin for lethargy/fatigue. Incontinent of bladder with low PVRs.   Revisions to Treatment Plan:  Timed Toileting initiated for incontinence management, added tilt in space wheelchair to work on sitting balance for toileting and transfers. Goals downgraded to MOD assist and work on [place]  transfers. Change to night shift bath.Team recommends SNF as MOD assist level projected for discharge is a lot for family; grand-children to manage when daughter is at work.    Medical Summary Current Status: R thigh pain exercise related, TF for severe dysphagia Weekly Focus/Goal: Right thigh pain ,  Barriers to Discharge: Medical stability;Nutrition means  Barriers to Discharge Comments: inconsistent po Possible Resolutions to Barriers: Megace for appetite   Continued Need for Acute Rehabilitation Level of Care: The patient requires daily medical management by a physician with specialized training in physical medicine and rehabilitation for the following reasons: Direction of a multidisciplinary physical rehabilitation program to maximize functional independence : Yes Medical management of patient stability for increased activity during participation in an intensive rehabilitation regime.: Yes Analysis of laboratory values and/or radiology reports with any subsequent need for medication adjustment and/or medical intervention. : Yes   I attest that I was present, lead the team conference, and concur with the assessment and plan of the team.   Dorien Chihuahua B 01/13/2020, 1:07 PM

## 2020-01-13 NOTE — Progress Notes (Signed)
MEWs 3/ resp entered at 158 in error, corrected the information entered.

## 2020-01-13 NOTE — Progress Notes (Signed)
Patient ID: Anne [name], female   DOB: 1960-08-19, 59 y.o.   MRN: [id number]  Team Conference Report to Patient/Family  Team Conference discussion was reviewed with the patient and caregiver, including goals, any changes in plan of care and target discharge date.  Patient and caregiver express understanding and are in agreement.  The patient has a target discharge date of  (3-3.5 weeks).  [name] 01/13/2020, 1:50 PM

## 2020-01-13 NOTE — Progress Notes (Signed)
Nutrition Follow-up  RD working remotely.  DOCUMENTATION CODES:   Obesity unspecified  INTERVENTION:   - Please obtain updated weight, last weight is from 12/31/19  Continue nocturnal tube feeding via Cortrak: - Osmolite 1.5 @ 60 ml/hr for 10 hours from 1800 to 0400 (total of 600 ml) - Pro-stat 30 ml BID - Free water per MD/PA, currently 100 ml TID  Tube feeding regimen and current free water provides 1100 kcal, 68 grams of protein, and 757 ml of H2O (meets 61% of kcal needs and 68% of protein needs).  - Continue Magic cup BID with meals, each supplement provides 290 kcal and 9 grams of protein  - Continue Vital Cuisine Shake BID with meals, each supplement provides 520 kcal and 22 grams of protein  - Continue MVI with minerals daily  NUTRITION DIAGNOSIS:   Inadequate oral intake related to dysphagia as evidenced by meal completion < 50%.  Progressing  GOAL:   Patient will meet greater than or equal to 90% of their needs  Progressing  MONITOR:   PO intake, Supplement acceptance, Diet advancement, Labs, TF tolerance  REASON FOR ASSESSMENT:   Consult Calorie Count  ASSESSMENT:   59 yo female admitted [facility] Rehab from [facility] S/P stroke with functional deficits. PMH includes CAD, coronary vasospasms, asthma, tobacco abuse.  6/17 - s/p thrombectomy 6/18 - Cortrak placed 6/24 - s/p MBS, diet advance to dysphagia 1 with nectar-thick liquids 6/25 - transferred to CIR, transitioned to nocturnal TF  A 72-hour calorie count was ordered and completed. Please see note from 6/28 for details. At most, pt was meeting 60% of her minimum estimated kcal needs and 49% of her minimum estimated protein needs.  Meal intake remains poor at most meals. Recommend continuing nocturnal tube feeds at this time.  Cortrak remains in place in left nare.  No new weights since 12/31/19. Please obtain an updated weight.  Per RN edema assessment, pt with non-pitting edema to LUE and mild pitting  edema to LLE.  Current TF orders: Osmolite 1.5 @ 60 ml/hr for 10 hours from 1800 to 0400, Pro-stat 30 ml BID, free water flushes of 100 ml TID  Meal Completion: 10-100% x last 8 recorded meals (averaging 33%)  Medications reviewed and include: folic acid, Megace 053 mg BID, ritalin, MVI with minerals, thiamine  Labs reviewed. CBG's: 96-134 x 24 hours  NUTRITION - FOCUSED PHYSICAL EXAM:  Unable to complete at this time. RD working remotely.  Diet Order:   Diet Order            DIET - DYS 1 Room service appropriate? Yes; Fluid consistency: Nectar Thick  Diet effective now                 EDUCATION NEEDS:   No education needs have been identified at this time  Skin:  Skin Assessment: Skin Integrity Issues: Incisions: R groin  Last BM:  01/13/20 medium type 6  Height:   Ht Readings from Last 1 Encounters:  12/31/19 5\' 6"  (1.676 m)    Weight:   Wt Readings from Last 1 Encounters:  12/31/19 91.4 kg    Ideal Body Weight:  59.1 kg  Estimated Nutritional Needs:   Kcal:  1800-2000  Protein:  100-120 gm  Fluid:  >/= 1.8 L    Gaynell Face, MS, RD, LDN Inpatient Clinical Dietitian Pager: [phone] Weekend/After Hours: [phone]

## 2020-01-13 NOTE — Progress Notes (Signed)
Occupational Therapy Session Note  Patient Details  Name: Anne [name] MRN: [id number] Date of Birth: Jan 11, 1961  Today's Date: 01/13/2020 OT Individual Time: 1415-1530 OT Individual Time Calculation (min): 75 min    Short Term Goals: Week 1:  OT Short Term Goal 1 (Week 1): Pt will complete sit<>stand for ADL with max A. OT Short Term Goal 2 (Week 1): Pt will locate 3 self-care items on L side with mod cues. OT Short Term Goal 3 (Week 1): Pt will don shirt with mod A using hemi dressing techniques. OT Short Term Goal 4 (Week 1): Pt will demonstrate static sitting balance for 30+ seconds with CGA.  Skilled Therapeutic Interventions/Progress Updates:    Upon entering the room, pt supine in bed with no c/o pain and agreeable to OT intervention. Room smelled strong of odor and when asked pt reports she thought she was incontinent. Pt rolling L <> R with max A and able to wash peri area and buttocks this session with set up A. OT providing total A to don clean brief and pants from bed level. Supine >sit on EOB with total A. Pt needing min A initially for static sitting balance. Pt very distracted by bed linens and needing max multimodal cuing for redirection. Pt then able to look to L and obtain wash cloth to wash part of UB. OT providing min - max A depending on pt's level of fatigue and task. Pt needing total A to don pull over shirt while seated EOB as well. Sit >supine with total A and repositioning. [name] arrived to room and pt performed oral hygiene with suction toothbrush and set up A. OT provided ice chips and pt placing one at a time into mouth with use of spoon. Pt remained in bed with call bell and all needed items within reach. Caregiver remaining. Ice removed.   Therapy Documentation Precautions:  Precautions Precautions: Other (comment) Precaution Comments: L hemi Restrictions Weight Bearing Restrictions: No Vital Signs: Therapy Vitals Temp: 98.6 F (37 C) Temp Source:  Oral Pulse Rate: 88 Resp: (!) 22 BP: 139/75 Patient Position (if appropriate): Sitting Oxygen Therapy SpO2: 100 % O2 Device: Room Air   Therapy/Group: Individual Therapy  [name] 01/13/2020, 4:11 PM

## 2020-01-13 NOTE — Progress Notes (Signed)
Speech Language Pathology Daily Session Note  Patient Details  Name: Kyiesha Millward MRN: [id number] Date of Birth: 01-14-1961  Today's Date: 01/13/2020 SLP Individual Time: 2458-0998 SLP Individual Time Calculation (min): 55 min  Short Term Goals: Week 1: SLP Short Term Goal 1 (Week 1): Pt will consume dys 1 textures and nectar thick liquids with min assist for use of swallowing precautions. SLP Short Term Goal 2 (Week 1): Pt will consume therapeutic trials of thin liquids without respiratory decompensation over 1 week prior to repeat instrumental assessment of swallowing function. SLP Short Term Goal 3 (Week 1): Pt will sustain her attention to basic functional tasks for 3-5 minute intervals with mod assist verbal cues for redirection. SLP Short Term Goal 4 (Week 1): Pt will complete basic, familiar tasks with max assist multimodal cues for functional problem solving. SLP Short Term Goal 5 (Week 1): Pt will locate items to the left of midline during basic, famliar tasks with max assist multimodal cues. SLP Short Term Goal 6 (Week 1): Pt will utilize increased vocal intensity and slow rate to achieve intelligibility at the word level with mod assist multimodal cues.  Skilled Therapeutic Interventions: Skilled treatment session focused on cognitive goals.  Upon arrival, patient appeared lethargic and reported pain in her right lower extremity, RN aware and patient pre-medicated. SLP facilitated session by providing overall Max A verbal cues for sustained attention and attention to left field of environment during an informal conversation while discussing family pictures. Patient answered all questions with extra time and multiple repetitions with overall Min A verbal cues needed for use of speech intelligibility strategies to achieve ~80% intelligibility. Patient requested to use the bedpan and required Max verbal cues for attention to task. Patient had a small bowel movement but unaware and  perseverative on "cleaning her butt." Patient left upright in bed with alarm on and all needs within reach. Continue with current plan of care.       Pain Pain Assessment Pain Scale: 0-10 Pain Score: 10-Worst pain ever Pain Type: Acute pain Pain Location: Leg Pain Orientation: Right Pain Descriptors / Indicators: Sore;Spasm Pain Frequency: Rarely Pain Intervention(s): Medication (See eMAR)  Therapy/Group: Individual Therapy  Raylynne Cubbage 01/13/2020, 10:11 AM

## 2020-01-13 NOTE — Progress Notes (Signed)
Physical Therapy Session Note  Patient Details  Name: Anne [name] MRN: [id number] Date of Birth: 1960/11/05  Today's Date: 01/13/2020 PT Individual Time: 1030-1130 and 1300-1340 PT Individual Time Calculation (min): 60 min and 40 min  Short Term Goals: Week 1:  PT Short Term Goal 1 (Week 1): Pt will sit EOB with min assist x 5 mintues PT Short Term Goal 2 (Week 1): Pt will initiate gait training PT Short Term Goal 3 (Week 1): Pt will transfer to and from [facility] with mod assist +2 using SB. PT Short Term Goal 4 (Week 1): Pt will perform bed mobility with max assist of 1  Skilled Therapeutic Interventions/Progress Updates: Pt presented in bed awake and agreeable to therapy. Pt with dry brief but when asked if would like to sit on toilet before putting on pants pt states "yes" performed supine to sit with maxA and +2 present for safety. Pt attempting to reach for footboard to hold self with PTA assisting in placing hand at side for sitting balance. Pt was able to maintain sitting balance with modA as PTA placed stedy for transfer. Pt performed STS with maxA x 2 and requiring increased time due to decreased initiation. Noted increased pushing with RUE this session as compared to yesterday. Pt transferred to toilet and demonstrated improved initiation for standing from [place]. Upon removing brief pt noted to have bowel incontinence. Pt transferred to elevated toilet maxA. Pt then having additional BM. Pt noted to rest RUE on seat of Stedy for sitting balance. As pt noted to become more fatigued from sitting on toilet PTA encouraged pt to stand to complete peri-care before becoming too fatigued. Pt required maxA x 2 for stand with PTA providing facilitation at hips. PTA completed peri-care total A in [place]. Pt then noted to actively have another BM. Pt transferred back to toilet with pt completing BM. Due to pt's fatigue once completed required +2 for stand then additional person to complete peri-care and  perform clothing management due to significant L lean due to pushing. Pt then transferred to TIS with heavy posterior tilt to allow pt to rest. Pt left in TIS with belt alarm on, call bell within reach and needs met.   Tx2: Pt presented in TIS with [name] LSW present speaking the dgt on speaker phone. Pt notably emotional after call however pt agreeable to session. Pt was labile throughout session with PTA providing emotional support. Session focused on trial of SB transfer back to bed. Pt required maxA with max multimodal cues for scooting forward in chair however was able to initiate scooting with R hip. Pt required total A for SB set up and max multimodal cues for anterior lean to improve head/hips relationship. Pt required maxA x 2 for SB transfer to L. Pt required maxA x 2 sit to supine. Once repositioned in bed pt requesting "water" explained PTA unable to provide water however able to provide ice chips after oral care. PTA set up suction brush and pt was able to take over oral care once PTA initiated. Pt required verbal cues to clean L side of mouth as favored R only. Once completed pt consumed x 5 ice chips with no coughing noted. Pt left in bed in upright position with bed alarm on, call bell within reach and needs met.      Therapy Documentation Precautions:  Precautions Precautions: Other (comment) Precaution Comments: L hemi Restrictions Weight Bearing Restrictions: No General:   Vital Signs: Therapy Vitals Temp: 98.6 F (37  C) Temp Source: Oral Pulse Rate: 88 Resp: (!) 22 BP: 139/75 Patient Position (if appropriate): Sitting Oxygen Therapy SpO2: 100 % O2 Device: Room Air Pain:   Mobility:   Locomotion :    Trunk/Postural Assessment :    Balance:   Exercises:   Other Treatments:      Therapy/Group: Individual Therapy  Morganna Styles 01/13/2020, 3:42 PM

## 2020-01-14 ENCOUNTER — Inpatient Hospital Stay (HOSPITAL_COMMUNITY): Payer: Medicaid Other | Admitting: Speech Pathology

## 2020-01-14 ENCOUNTER — Inpatient Hospital Stay (HOSPITAL_COMMUNITY): Payer: Medicaid Other | Admitting: Occupational Therapy

## 2020-01-14 ENCOUNTER — Inpatient Hospital Stay (HOSPITAL_COMMUNITY): Payer: Medicaid Other | Admitting: Physical Therapy

## 2020-01-14 NOTE — Progress Notes (Signed)
Speech Language Pathology Daily Session Note  Patient Details  Name: Anne [name] MRN: [id number] Date of Birth: Nov 14, 1960  Today's Date: 01/14/2020 SLP Individual Time: 0925-1020 SLP Individual Time Calculation (min): 55 min  Short Term Goals: Week 1: SLP Short Term Goal 1 (Week 1): Pt will consume dys 1 textures and nectar thick liquids with min assist for use of swallowing precautions. SLP Short Term Goal 2 (Week 1): Pt will consume therapeutic trials of thin liquids without respiratory decompensation over 1 week prior to repeat instrumental assessment of swallowing function. SLP Short Term Goal 3 (Week 1): Pt will sustain her attention to basic functional tasks for 3-5 minute intervals with mod assist verbal cues for redirection. SLP Short Term Goal 4 (Week 1): Pt will complete basic, familiar tasks with max assist multimodal cues for functional problem solving. SLP Short Term Goal 5 (Week 1): Pt will locate items to the left of midline during basic, famliar tasks with max assist multimodal cues. SLP Short Term Goal 6 (Week 1): Pt will utilize increased vocal intensity and slow rate to achieve intelligibility at the word level with mod assist multimodal cues.  Skilled Therapeutic Interventions: Skilled treatment session focused on dysphagia and cognitive goals. SLP facilitated session by providing trials of ice chips after oral care. Patient required Min-Mod A verbal cues for attention to bolus resulting in overt intermittent coughing. Recommend continued trials with SLP. Patient performed oral care with the suction toothbrush prior to trials and required Mod verbal and tactile cues for cessation of task. SLP also facilitated session by providing Max A verbal cues for attention to a basic money management task due to patient being distracted by pain. Patient sorted coins from a field of 4 with extra time and supervision verbal cues and required Mod verbal and visual cues to count change.  Patient left upright in the tilt-in-space wheelchair with alarm on and all needs within reach. Continue with current plan of care.      Pain Pain in BLE, RN aware and patient pre-medicated. Patient also repositioned   Therapy/Group: Individual Therapy  Anne [name] 01/14/2020, 10:24 AM

## 2020-01-14 NOTE — Progress Notes (Signed)
[place] PHYSICAL MEDICINE & REHABILITATION PROGRESS NOTE   Subjective/Complaints:  RIght thigh pain, mainly with movement  No numbness or tingling no joint pain  Pt without hx of back surgery but has had back pain in past   Objective:   No results found. No results for input(s): WBC, HGB, HCT, PLT in the last 72 hours. No results for input(s): NA, K, CL, CO2, GLUCOSE, BUN, CREATININE, CALCIUM in the last 72 hours.  Intake/Output Summary (Last 24 hours) at 01/14/2020 0750 Last data filed at 01/13/2020 2311 Gross per 24 hour  Intake 760 ml  Output --  Net 760 ml     Physical Exam: Vital Signs Blood pressure 106/86, pulse 96, temperature 99 F (37.2 C), temperature source Oral, resp. rate 17, SpO2 94 %.   General: No acute distress Mood and affect are appropriate Heart: Regular rate and rhythm no rubs murmurs or extra sounds Lungs: Clear to auscultation, breathing unlabored, no rales or wheezes Abdomen: Positive bowel sounds, soft nontender to palpation, nondistended Extremities: No clubbing, cyanosis, or edema Skin: No evidence of breakdown, no evidence of rash Neurologic: Cranial nerves II through XII intact, motor strength is 5/5 in right and 0/5 left deltoid, bicep, tricep, grip, hip flexor, knee extensors, ankle dorsiflexor and plantar flexor  Musculoskeletal: Full range of motion in all 4 extremities. No joint swelling No pain with RIght hip or knee ROM,     Assessment/Plan: 1. 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  which require 3+ hours per day of interdisciplinary therapy in a comprehensive inpatient rehab setting.  Physiatrist is providing close team supervision and 24 hour management of active medical problems listed below.  Physiatrist and rehab team continue to assess barriers to discharge/monitor patient progress toward functional and medical goals  Care Tool:  Bathing    Body parts bathed by patient: Chest, Face, Front perineal area,  Buttocks, Abdomen   Body parts bathed by helper: Right arm, Left arm, Right upper leg, Left upper leg, Right lower leg, Left lower leg     Bathing assist Assist Level: Maximal Assistance - Patient 24 - 49%     Upper Body Dressing/Undressing Upper body dressing   What is the patient wearing?: Pull over shirt    Upper body assist Assist Level: Total Assistance - Patient < 25%    Lower Body Dressing/Undressing Lower body dressing      What is the patient wearing?: Incontinence brief, Pants     Lower body assist Assist for lower body dressing: Dependent - Patient 0%     Toileting Toileting    Toileting assist Assist for toileting: 2 Helpers     Transfers Chair/bed transfer  Transfers assist     Chair/bed transfer assist level: Total Assistance - Patient < 25%     Locomotion Ambulation   Ambulation assist   Ambulation activity did not occur: Safety/medical concerns  Assist level: Total Assistance - Patient < 25% Assistive device:  (wall rail) Max distance: 3 steps   Walk 10 feet activity   Assist  Walk 10 feet activity did not occur: Safety/medical concerns        Walk 50 feet activity   Assist Walk 50 feet with 2 turns activity did not occur: Safety/medical concerns         Walk 150 feet activity   Assist Walk 150 feet activity did not occur: Safety/medical concerns         Walk 10 feet on uneven surface  activity  Assist Walk 10 feet on uneven surfaces activity did not occur: Safety/medical concerns         Wheelchair     Assist Will patient use wheelchair at discharge?: Yes Type of Wheelchair: Manual    Wheelchair assist level: Dependent - Patient 0% Max wheelchair distance: 150    Wheelchair 50 feet with 2 turns activity    Assist        Assist Level: Dependent - Patient 0%   Wheelchair 150 feet activity     Assist      Assist Level: Dependent - Patient 0%   Blood pressure 106/86, pulse 96,  temperature 99 F (37.2 C), temperature source Oral, resp. rate 17, SpO2 94 %.  Medical Problem List and Plan: 1. Left hemiplegia with right gaze preference, has difficulty tracking beyond midline to the left and continues to have limited verbal output secondary to large right MCA infarct.  -patient may not shower until can participate in shower -ELOS/Goals: 27-30 days/min a Continue CIR PT, OT, SLP  Team conference today please see physician documentation under team conference tab, met with team  to discuss problems,progress, and goals. Formulized individual treatment plan based on medical history, underlying problem and comorbidities.  2. Antithrombotics: -DVT/anticoagulation: Pharmaceutical: Lovenox -antiplatelet therapy: On ASA/Plavix 3. Pain Management: Tylenol prn. Sports creme for Right muscular thigh pain exercise related  4. Mood: LCSW to follow for evaluation and support.  -antipsychotic agents: N/A 5. Neuropsych: This patient is not fully capable of making decisions on his own behalf. 6. Skin/Wound Care: Routine pressure relief measures 7. Fluids/Electrolytes/Nutrition: Monitor I/Os. CMP ordered. 8. HTN: Monitor BP tid--Imdur, lisinopril, cardizem and amlodipine has been on hold. Will resume Cardizem 30 mg qid as tachycardia noted and BP still labile.             6/26- BP 150/80- Pulse 84- improved, but don't want to decrease too fast because might cause BP to go too low             6/27- BP well controlled- con't regimen  Monitor with increased mobility. 9. UTI: Resolved               10. COPD: Continue Pulmicort nebs bid. 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 To contact Dr. Noralee Space closer to discharge.  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 Change tube feeds to nights. Add water flushes. BMP ordered.             6/26- pt asking for water- can have Ice chips-  hopefully giving some fluids will make her feel less dry             Advance diet as tolerated. 13. Insomnia?: Will monitor sleep wake cycle. iWas on ambien and trazodone PTA? Will order Ambien prn for now.   14. Azotemia due to dysphagia             6/26- will give NS IVFs due to BUN of 38-recheck labs Monday 15. Hyperglycemia             Due to TF, improved  16. Constipation Resolved type 6 BM x 2 today , hold on further PRN laxative    LOS: 6 days A FACE TO FACE EVALUATION WAS PERFORMED  Charlett Blake 01/14/2020, 7:50 AM

## 2020-01-14 NOTE — Progress Notes (Signed)
Occupational Therapy Session Note  Patient Details  Name: Anne [name] MRN: [id number] Date of Birth: 09-27-1960  Today's Date: 01/14/2020 OT Individual Time: 0805-0900 OT Individual Time Calculation (min): 55 min    Short Term Goals: Week 1:  OT Short Term Goal 1 (Week 1): Pt will complete sit<>stand for ADL with max A. OT Short Term Goal 2 (Week 1): Pt will locate 3 self-care items on L side with mod cues. OT Short Term Goal 3 (Week 1): Pt will don shirt with mod A using hemi dressing techniques. OT Short Term Goal 4 (Week 1): Pt will demonstrate static sitting balance for 30+ seconds with CGA.  Skilled Therapeutic Interventions/Progress Updates:    Upon entering the room, pt supine in bed with no c/o pain and NT having just changed pt's brief. Pt rolling L <> R with total A. Therapist thread pants onto B feet and up/over B hips. Pt performing supine >sit from flat bed with total A. Static sitting balance with mod A. Slide board placed and pt transferred to the R with total A and second person for safety and to secure equipment. OT provided supervision and encouragement for breakfast with pt feeding self with use of utensils and minimal spillage. Food placed at midline for pt to continue to visually scan but was also brought to R side to encourage more intake. Pt remained in tilt in space wheelchair with chair alarm belt donned and call bell within reach.   Therapy Documentation Precautions:  Precautions Precautions: Other (comment) Precaution Comments: L hemi Restrictions Weight Bearing Restrictions: No General:   Vital Signs: Therapy Vitals BP: (!) 147/66 Pain: Pain Assessment Pain Scale: 0-10 Pain Score: 6    Therapy/Group: Individual Therapy  [name] 01/14/2020, 12:45 PM

## 2020-01-14 NOTE — Progress Notes (Signed)
Physical Therapy Session Note  Patient Details  Name: Anne [name] MRN: [id number] Date of Birth: Jul 12, 1961  Today's Date: 01/14/2020 PT Individual Time: 6967-8938 PT Individual Time Calculation (min): 71 min   Short Term Goals: Week 1:  PT Short Term Goal 1 (Week 1): Pt will sit EOB with min assist x 5 mintues PT Short Term Goal 2 (Week 1): Pt will initiate gait training PT Short Term Goal 3 (Week 1): Pt will transfer to and from [facility] with mod assist +2 using SB. PT Short Term Goal 4 (Week 1): Pt will perform bed mobility with max assist of 1  Skilled Therapeutic Interventions/Progress Updates:   Pt received sitting in WC and agreeable to PT. Pt noted to be holding room phone trying to call for help to toilet.   PT assisted pt to perform stedy transfer to [facility] with total A +2. [name] brief removed in standing and noted to have been incontinent of bowel and bladder. Pt attempted to void on toilet, but unsuccessful. Sit<>stand with total A +2 in stedy and therapist perform peri care. Pt transported to rehab gym in [place] WC> stend transfer to ma table as listed above.   Sitting balance EOM to for improved postural alignment and decrease pushers response to the L. Lateral reach to the R x 10 with 3 sec hold with mod-max assist with facilitation to the L intercostal and QL region. Pt then able to sit EOM with supervision assist/min assist with visual feedback from mirror throughout the rest of treatment. Anterior weight shift to push through therapist chest with BUE in flexion x 8. Lateral scoot to the R with max-total A +2 and max cues for maintain and facilitate anterior weight shift.   Pt returned to room and performed total A +2 stedy transfer to bed with noted crepitus in the R shoulder. Sit>supine completed with total A +2 and left supine in bed with call bell in reach and all needs met.         Therapy Documentation Precautions:  Precautions Precautions: Other (comment) Precaution  Comments: L hemi Restrictions Weight Bearing Restrictions: No    Vital Signs: Therapy Vitals BP: (!) 147/66 Pain:   faces: nones.   Therapy/Group: Individual Therapy  [name] 01/14/2020, 2:14 PM

## 2020-01-15 ENCOUNTER — Inpatient Hospital Stay (HOSPITAL_COMMUNITY): Payer: Medicaid Other | Admitting: Speech Pathology

## 2020-01-15 ENCOUNTER — Inpatient Hospital Stay (HOSPITAL_COMMUNITY): Payer: Medicaid Other | Admitting: Occupational Therapy

## 2020-01-15 ENCOUNTER — Inpatient Hospital Stay (HOSPITAL_COMMUNITY): Payer: Medicaid Other | Admitting: Physical Therapy

## 2020-01-15 NOTE — Progress Notes (Signed)
Occupational Therapy Session Note  Patient Details  Name: Anne [name] MRN: [id number] Date of Birth: 1960-12-03  Today's Date: 01/15/2020 OT Individual Time: 9794-8016 OT Individual Time Calculation (min): 71 min   Short Term Goals: Week 1:  OT Short Term Goal 1 (Week 1): Pt will complete sit<>stand for ADL with max A. OT Short Term Goal 2 (Week 1): Pt will locate 3 self-care items on L side with mod cues. OT Short Term Goal 3 (Week 1): Pt will don shirt with mod A using hemi dressing techniques. OT Short Term Goal 4 (Week 1): Pt will demonstrate static sitting balance for 30+ seconds with CGA.  Skilled Therapeutic Interventions/Progress Updates:    Pt greeted in bed with no s/s pain. Supine<sit completed with +2 assist, pt needing increased time to initiate assisting OT and RT with functional movement on the Rt side, no movement initiated on the Lt side. Once EOB, pt with significant Lt pushing needing Max A to maintain sitting balance while Stedy was set up by 2nd person. Tried several sit<stands using Stedy with +2 from elevated bed, pt given cuing and increased time to initiate assisting. Pt ultimately needing +3 assist to boost into Stedy and transfer safely to elevated BSC. Pt given increased time to void, noted increased impulsivity with pt reaching for items on the Rt side. Pt initiated taking flower vase off her dresser to smell the flowers. +3 to 4 assist for sit<stand, hygiene, and clothing mgt after due to Lt pushing and pts decreased ability to initiate assistance. Note she did not void. Stedy transfer completed to the [place]. While sitting in neutral position at the sink, pt engaged in UB bathing/dressing tasks with [facility] to attend to the Lt side. Pt once again very impulsive with reaching for things on her Rt side. At end of session pt was reclined in TIS with safety belt fastened, lateral prop for Lt trunk and Lt UE elevated, call bell and phone in lap. Tx focus placed on praxis,  initiation, sustained attention, ADL retraining, and functional transfers.   Therapy Documentation Precautions:  Precautions Precautions: Other (comment) Precaution Comments: L hemi Restrictions Weight Bearing Restrictions: No Vital Signs:  Pain:   ADL:        Therapy/Group: Individual Therapy  Anne [name] A Fong Mccarry 01/15/2020, 12:38 PM

## 2020-01-15 NOTE — Progress Notes (Signed)
[place] PHYSICAL MEDICINE & REHABILITATION PROGRESS NOTE   Subjective/Complaints:  Patient somnolent this morning opens eyes briefly with physical stimulation. Pt without hx of back surgery but has had back pain in past  Review of systems could not obtain secondary to mental status. Objective:   No results found. No results for input(s): WBC, HGB, HCT, PLT in the last 72 hours. No results for input(s): NA, K, CL, CO2, GLUCOSE, BUN, CREATININE, CALCIUM in the last 72 hours.  Intake/Output Summary (Last 24 hours) at 01/15/2020 0918 Last data filed at 01/14/2020 1800 Gross per 24 hour  Intake 500 ml  Output --  Net 500 ml     Physical Exam: Vital Signs Blood pressure (!) 141/69, pulse 77, temperature 98.4 F (36.9 C), resp. rate 17, SpO2 100 %.  General: No acute distress Mood and affect are appropriate Heart: Regular rate and rhythm no rubs murmurs or extra sounds Lungs: Clear to auscultation, breathing unlabored, no rales or wheezes Abdomen: Positive bowel sounds, soft nontender to palpation, nondistended Extremities: No clubbing, cyanosis, or edema Skin: No evidence of breakdown, no evidence of rash Neurologic: Cranial nerves II through XII intact, motor strength is 5/5 in right and 0/5 left deltoid, bicep, tricep, grip, hip flexor, knee extensors, ankle dorsiflexor and plantar flexor  Musculoskeletal: Full range of motion in all 4 extremities. No joint swelling No pain with RIght hip or knee ROM,     Assessment/Plan: 1. 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  which require 3+ hours per day of interdisciplinary therapy in a comprehensive inpatient rehab setting.  Physiatrist is providing close team supervision and 24 hour management of active medical problems listed below.  Physiatrist and rehab team continue to assess barriers to discharge/monitor patient progress toward functional and medical goals  Care Tool:  Bathing    Body parts bathed by patient:  Chest, Face, Front perineal area, Buttocks, Abdomen   Body parts bathed by helper: Right arm, Left arm, Right upper leg, Left upper leg, Right lower leg, Left lower leg     Bathing assist Assist Level: Maximal Assistance - Patient 24 - 49%     Upper Body Dressing/Undressing Upper body dressing   What is the patient wearing?: Pull over shirt    Upper body assist Assist Level: Total Assistance - Patient < 25%    Lower Body Dressing/Undressing Lower body dressing      What is the patient wearing?: Incontinence brief, Pants     Lower body assist Assist for lower body dressing: 2 Helpers     Toileting Toileting    Toileting assist Assist for toileting: 2 Helpers     Transfers Chair/bed transfer  Transfers assist     Chair/bed transfer assist level: Total Assistance - Patient < 25%     Locomotion Ambulation   Ambulation assist   Ambulation activity did not occur: Safety/medical concerns  Assist level: Total Assistance - Patient < 25% Assistive device:  (wall rail) Max distance: 3 steps   Walk 10 feet activity   Assist  Walk 10 feet activity did not occur: Safety/medical concerns        Walk 50 feet activity   Assist Walk 50 feet with 2 turns activity did not occur: Safety/medical concerns         Walk 150 feet activity   Assist Walk 150 feet activity did not occur: Safety/medical concerns         Walk 10 feet on uneven surface  activity   Assist  Walk 10 feet on uneven surfaces activity did not occur: Safety/medical concerns         Wheelchair     Assist Will patient use wheelchair at discharge?: Yes Type of Wheelchair: Manual    Wheelchair assist level: Dependent - Patient 0% Max wheelchair distance: 150    Wheelchair 50 feet with 2 turns activity    Assist        Assist Level: Dependent - Patient 0%   Wheelchair 150 feet activity     Assist      Assist Level: Dependent - Patient 0%   Blood pressure  (!) 141/69, pulse 77, temperature 98.4 F (36.9 C), resp. rate 17, SpO2 100 %.  Medical Problem List and Plan: 1. Left hemiplegia with right gaze preference, has difficulty tracking beyond midline to the left and continues to have limited verbal output secondary to large right MCA infarct.  - -ELOS/Goals: 27-30 days/min a Continue CIR PT, OT, SLP   2. Antithrombotics: -DVT/anticoagulation: Pharmaceutical: Lovenox -antiplatelet therapy: On ASA/Plavix 3. Pain Management: Tylenol prn. Sports creme for Right muscular thigh pain exercise related  4. Mood: LCSW to follow for evaluation and support.  -antipsychotic agents: N/A 5. Neuropsych: This patient is not fully capable of making decisions on his own behalf. 6. Skin/Wound Care: Routine pressure relief measures 7. Fluids/Electrolytes/Nutrition: Monitor I/Os. CMP ordered. 8. HTN: Monitor BP tid--Imdur, lisinopril, cardizem and amlodipine has been on hold.  Cardizem 30 mg qid  Vitals:   01/14/20 1946 01/15/20 0556  BP: (!) 142/70 (!) 141/69  Pulse: 81 77  Resp: 15 17  Temp: 98.7 F (37.1 C) 98.4 F (36.9 C)  SpO2: 99% 100%  Cont current dose of diltiazem 9. UTI: Resolved               10. COPD: Continue Pulmicort nebs bid. 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 To contact Dr. Katherina Right closer to discharge.  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 Change tube feeds to nights. Add water flushes. BMP ordered.             6/26- pt asking for water- can have Ice chips- hopefully giving some fluids will make her feel less dry             Advance diet as tolerated. 13. Insomnia?: Will monitor sleep wake cycle. iWas on ambien and trazodone PTA? Will order Ambien prn for now.   14. Azotemia due to dysphagia             6/26- will give NS IVFs due to BUN of 38-recheck labs Monday 15. Hyperglycemia             Due to TF, improved  16.  Constipation Resolved, hold on further PRN laxative    LOS: 7 days A FACE TO FACE EVALUATION WAS PERFORMED  Charlett Blake 01/15/2020, 9:18 AM

## 2020-01-15 NOTE — Progress Notes (Signed)
Physical Therapy Session Note  Patient Details  Name: Anne [name] MRN: [id number] Date of Birth: May 31, 1961  Today's Date: 01/15/2020 PT Individual Time: 8315-1761 PT Individual Time Calculation (min): 72 min   Short Term Goals: Week 1:  PT Short Term Goal 1 (Week 1): Pt will sit EOB with min assist x 5 mintues PT Short Term Goal 2 (Week 1): Pt will initiate gait training PT Short Term Goal 3 (Week 1): Pt will transfer to and from [facility] with mod assist +2 using SB. PT Short Term Goal 4 (Week 1): Pt will perform bed mobility with max assist of 1 Week 2:     Skilled Therapeutic Interventions/Progress Updates:   Pt received sitting in WC and agreeable to PT. Pt reports that she had been incontinent. Sit<>stand with max assist and RUE on rial in bathroom. Brief assessed and no noted incontinence. Pt noted to bear weight through the LLE in standing. Pt transported to rail in hall. PT instructed pt in sit<>stand with mod assist and max cues to initiate at rail with LUE over PT shoulder. Gait training at rail with max assist + 2 for WC follow 2 x 15 ft. PT required to advance the LLE, but pt able to bear weight through LLE with only mod assist to prevent buckle. Pt able to sustain midline in stance on this day.   following gait training pt reports need to urinate.  Stand pivot transfer to toilet with max assist + 2 for safety with RUE on rail. Pt noted to have been incontinent. Peri care performed by pt with L lateral lean, but no LOB. Sit<>stand with max assist and rail to pull pants to waist with +2.   Ambulatory transfer to bed with 3 musketeer technique. PT advanced the LLE throughout and provided tactile cues for improved knee extension in stance. Mild midline disorientation with turn to the R with increased assist to improved adequate weight shifting. Sit<>supine in bed with max assist for trunk and LE management.   PT adjusted BLE foot rests on WC to allow improved seating position, tolerance  and pressure relief in BLE. PT also inflated Roho cushion to improve proper pressure relief.   Supine BLE NMR bridges with BLE 2 x 5 with max cues for initiation of movement. Hip/knee flexion/extension for BLEx 10. Pt able to extend the LLE against resistance and initiate flexion with cues for attention to the LE.   Pt left supine in bed with call bell in reach with all needs met.       Therapy Documentation Precautions:  Precautions Precautions: Other (comment) Precaution Comments: L hemi Restrictions Weight Bearing Restrictions: No Pain: Pain Assessment Pain Scale: 0-10 Pain Score: 6    Therapy/Group: Individual Therapy  [name] 01/15/2020, 11:59 PM

## 2020-01-15 NOTE — Progress Notes (Signed)
Speech Language Pathology Weekly Progress and Session Note  Patient Details  Name: Anne [name] MRN: [id number] Date of Birth: 05-04-1961  Beginning of progress report period: 01/08/2020 End of progress report period: 01/15/2020  Today's Date: 01/15/2020 SLP Individual Time: 9629-5284 SLP Individual Time Calculation (min): 55 min  Short Term Goals: Week 1: SLP Short Term Goal 1 (Week 1): Pt will consume dys 1 textures and nectar thick liquids with min assist for use of swallowing precautions. SLP Short Term Goal 1 - Progress (Week 1): Progressing toward goal SLP Short Term Goal 2 (Week 1): Pt will consume therapeutic trials of thin liquids without respiratory decompensation over 1 week prior to repeat instrumental assessment of swallowing function. SLP Short Term Goal 2 - Progress (Week 1): Progressing toward goal SLP Short Term Goal 3 (Week 1): Pt will sustain her attention to basic functional tasks for 3-5 minute intervals with mod assist verbal cues for redirection. SLP Short Term Goal 3 - Progress (Week 1): Progressing toward goal SLP Short Term Goal 4 (Week 1): Pt will complete basic, familiar tasks with max assist multimodal cues for functional problem solving. SLP Short Term Goal 4 - Progress (Week 1): Met SLP Short Term Goal 5 (Week 1): Pt will locate items to the left of midline during basic, famliar tasks with max assist multimodal cues. SLP Short Term Goal 5 - Progress (Week 1): Met SLP Short Term Goal 6 (Week 1): Pt will utilize increased vocal intensity and slow rate to achieve intelligibility at the word level with mod assist multimodal cues. SLP Short Term Goal 6 - Progress (Week 1): Progressing toward goal    New Short Term Goals: Week 2: SLP Short Term Goal 1 (Week 2): Pt will consume dys 1 textures and nectar thick liquids with min assist for use of swallowing precautions. SLP Short Term Goal 2 (Week 2): Pt will consume therapeutic trials of thin liquids without  respiratory decompensation over 1 week prior to repeat instrumental assessment of swallowing function. SLP Short Term Goal 3 (Week 2): Pt will sustain her attention to basic functional tasks for 3-5 minute intervals with mod assist verbal cues for redirection. SLP Short Term Goal 4 (Week 2): Pt will complete basic, familiar tasks with mod assist multimodal cues for functional problem solving. SLP Short Term Goal 5 (Week 2): Pt will locate items to the left of midline during basic, famliar tasks with mod assist multimodal cues. SLP Short Term Goal 6 (Week 2): Pt will utilize increased vocal intensity and slow rate to achieve intelligibility at the word level with mod assist multimodal cues.  Weekly Progress Updates:   Pt has made slow, functional gains this reporting period and has met 2 out of 6 short term goals.  Pt is currently max assist for tasks due to significant cognitive deficits s/p R brain infarct.  Pt has demonstrated improved visual scanning to the left of midline with cuing.  Pt is consuming dys 1, thin liquids diet with mod cues for use of swallowing precautions.  Pt and family education is ongoing.  Pt would continue to benefit from skilled ST while inpatient in order to maximize functional independence and reduce burden of care prior to discharge.  Anticipate that pt will need 24/7 supervision at discharge in addition to [place] follow up at next level of care    Intensity: Minumum of 1-2 x/day, 30 to 90 minutes Frequency: 3 to 5 out of 7 days Duration/Length of Stay: 27-30 days Treatment/Interventions: Cognitive remediation/compensation;Cueing hierarchy;Environmental  controls;Dysphagia/aspiration precaution training;Internal/external aids;Functional tasks;Patient/family education   Daily Session  Skilled Therapeutic Interventions: Pt was seen for skilled ST targeting goals for dysphagia and cognition.  Upon arrival, pt was eating breakfast under the supervision of nursing.  Therapist  assumed full supervision and pt fed herself dys 1 textures with min cues for use of swallowing precautions.  Pt had delayed coughing which could have been related to residual grits remaining either in the oral cavity or pharynx given her decreased attention to boluses.  Pt did not drink any nectar thick liquids with therapist despite being offered several different options of drink.  After completion of meal, pt brushed her teeth and washed her face with mod-max assist verbal cues to attend to the left side of her face and mouth as well as for cessation of tasks due to perseveration.  Therapist facilitated the session with 2 symbol cancellation tasks of increasing complexity and a clock drawing task to address goals for visual scanning, attention to task, and problem solving.  Pt was able to find 7 out of 10 targets on the easier symbol cancellation task independently, improving to 10/10 with mod verbal cues to turn her head to locate targets on the far left side of the Anne [name].  On the more difficult symbol cancellation task which included sequential constraints (targets needed to be identified in a certain order), pt needed Max assist verbal cues for use of scanning strategies.  [name] clock drawing was significant for only containing numbers to the right of midline, numbers were poorly spaced on top of each other making them illegible, and the hands on the clock, while two different lengths, were pointing at incorrect numbers.   Pt had no awareness of errors and made no attempts to correct despite max cues from the therapist.  Pt was left in bed with bed alarm set and call bell within reach.  Goals updated on this date to reflect current progress and plan of care.    General    Pain Pain Assessment Pain Scale: 0-10 Pain Score: 0-No pain  Therapy/Group: Individual Therapy  Anne [name], Anne [name] 01/15/2020, 12:42 PM

## 2020-01-16 MED ORDER — METHYLPHENIDATE HCL 5 MG PO TABS
10.0000 mg | ORAL_TABLET | Freq: Two times a day (BID) | ORAL | Status: DC
  Administered 2020-01-16 – 2020-01-27 (×22): 10 mg via ORAL
  Filled 2020-01-16 (×22): qty 2

## 2020-01-16 NOTE — Progress Notes (Signed)
Physical Therapy Weekly Progress Note  Patient Details  Name: Anne [name] MRN: [id number] Date of Birth: [birth year]  Beginning of progress report period: January 09, 2020 End of progress report period: January 16, 2020  Today's Date: 01/16/2020     Patient has met 3 of 4 short term goals.  Pt is making slow progress towards LTG. Inconsistent initiation to all tasks as well as orientation to midline have prevented increased progress up to this point. Pt can intermittently sit EOB with min-supervision assist up to 5 minutes. Pt has initiated gait training at rail in hall and with 3 musketeer technique with max assist to advance the LLE and intermittently stabilize. In standing, pt able to intermittently bear weight through the LLE, but demonstrates inconsistency due to poor awareness of the LLE and attention to task.   Patient continues to demonstrate the following deficits muscle weakness, muscle joint tightness and muscle paralysis, decreased cardiorespiratoy endurance, impaired timing and sequencing, abnormal tone, unbalanced muscle activation, motor apraxia, decreased coordination and decreased motor planning, decreased visual perceptual skills, decreased visual motor skills and field cut, decreased midline orientation, decreased attention to left and left side neglect, decreased initiation, decreased attention, decreased awareness, decreased problem solving, decreased safety awareness, decreased memory and delayed processing and decreased sitting balance, decreased standing balance, decreased postural control, hemiplegia and decreased balance strategies and therefore will continue to benefit from skilled PT intervention to increase functional independence with mobility.  Patient progressing toward long term goals..  Continue plan of care.  PT Short Term Goals Week 1:  PT Short Term Goal 1 (Week 1): Pt will sit EOB with min assist x 5 mintues PT Short Term Goal 1 - Progress (Week 1): Met PT Short  Term Goal 2 (Week 1): Pt will initiate gait training PT Short Term Goal 2 - Progress (Week 1): Met PT Short Term Goal 3 (Week 1): Pt will transfer to and from [facility] with mod assist +2 using SB. PT Short Term Goal 3 - Progress (Week 1): Progressing toward goal PT Short Term Goal 4 (Week 1): Pt will perform bed mobility with max assist of 1 PT Short Term Goal 4 - Progress (Week 1): Met Week 2:  PT Short Term Goal 1 (Week 2): Pt will transfer to and from med with max assist of 1 consistently PT Short Term Goal 2 (Week 2): Family will begin hands on education PT Short Term Goal 3 (Week 2): Pt will perform WC mobility with hemi technique with mod assist x 50 ft PT Short Term Goal 4 (Week 2): Pt will perform gait training with HW x 32f and max assist +2.  Skilled Therapeutic Interventions/Progress Updates:      Therapy Documentation Precautions:  Precautions Precautions: Other (comment) Precaution Comments: L hemi Restrictions Weight Bearing Restrictions: No    Vital Signs: Therapy Vitals Temp: 98.1 F (36.7 C) Pulse Rate: 75 Resp: 20 BP: 133/66 Patient Position (if appropriate): Lying Oxygen Therapy SpO2: 98 % O2 Device: Room Air ALorie Phenix7/09/2019, 6:38 PM

## 2020-01-16 NOTE — Progress Notes (Signed)
[place] PHYSICAL MEDICINE & REHABILITATION PROGRESS NOTE   Subjective/Complaints:  No issues overnite, still inc of urine, discussed with NT, ate well this am, generally intake ~25-40% meals   Fluid intake improving 558ml and 734ml   Review of systems could not obtain secondary to mental status. Objective:   No results found. No results for input(s): WBC, HGB, HCT, PLT in the last 72 hours. No results for input(s): NA, K, CL, CO2, GLUCOSE, BUN, CREATININE, CALCIUM in the last 72 hours.  Intake/Output Summary (Last 24 hours) at 01/16/2020 0929 Last data filed at 01/16/2020 0840 Gross per 24 hour  Intake 600 ml  Output --  Net 600 ml     Physical Exam: Vital Signs Blood pressure 136/74, pulse 86, temperature 97.9 F (36.6 C), resp. rate 18, SpO2 96 %.  General: No acute distress Mood and affect are appropriate Heart: Regular rate and rhythm no rubs murmurs or extra sounds Lungs: Clear to auscultation, breathing unlabored, no rales or wheezes Abdomen: Positive bowel sounds, soft nontender to palpation, nondistended Extremities: No clubbing, cyanosis, or edema Skin: No evidence of breakdown, no evidence of rash Neurologic: Cranial nerves II through XII intact, motor strength is 5/5 in right and 0/5 left deltoid, bicep, tricep, grip, hip flexor, knee extensors, ankle dorsiflexor and plantar flexor  Musculoskeletal: Full range of motion in all 4 extremities. No joint swelling No pain with RIght hip or knee ROM,     Assessment/Plan: 1. 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  which require 3+ hours per day of interdisciplinary therapy in a comprehensive inpatient rehab setting.  Physiatrist is providing close team supervision and 24 hour management of active medical problems listed below.  Physiatrist and rehab team continue to assess barriers to discharge/monitor patient progress toward functional and medical goals  Care Tool:  Bathing    Body parts bathed by  patient: Face   Body parts bathed by helper: Right arm, Left arm, Right upper leg, Left upper leg, Right lower leg, Left lower leg     Bathing assist Assist Level: Minimal Assistance - Patient > 75%     Upper Body Dressing/Undressing Upper body dressing   What is the patient wearing?: Pull over shirt    Upper body assist Assist Level: Total Assistance - Patient < 25%    Lower Body Dressing/Undressing Lower body dressing      What is the patient wearing?: Incontinence brief, Pants     Lower body assist Assist for lower body dressing: 2 Helpers     Toileting Toileting    Toileting assist Assist for toileting: Dependent - Patient 0% (4 helpers using [facility])     Transfers Chair/bed transfer  Transfers assist     Chair/bed transfer assist level: Total Assistance - Patient < 25%     Locomotion Ambulation   Ambulation assist   Ambulation activity did not occur: Safety/medical concerns  Assist level: Total Assistance - Patient < 25% Assistive device:  (wall rail) Max distance: 3 steps   Walk 10 feet activity   Assist  Walk 10 feet activity did not occur: Safety/medical concerns        Walk 50 feet activity   Assist Walk 50 feet with 2 turns activity did not occur: Safety/medical concerns         Walk 150 feet activity   Assist Walk 150 feet activity did not occur: Safety/medical concerns         Walk 10 feet on uneven surface  activity   Assist Walk 10 feet on uneven surfaces activity did not occur: Safety/medical concerns         Wheelchair     Assist Will patient use wheelchair at discharge?: Yes Type of Wheelchair: Manual    Wheelchair assist level: Dependent - Patient 0% Max wheelchair distance: 150    Wheelchair 50 feet with 2 turns activity    Assist        Assist Level: Dependent - Patient 0%   Wheelchair 150 feet activity     Assist      Assist Level: Dependent - Patient 0%   Blood  pressure 136/74, pulse 86, temperature 97.9 F (36.6 C), resp. rate 18, SpO2 96 %.  Medical Problem List and Plan: 1. Left hemiplegia with right gaze preference, has difficulty tracking beyond midline to the left and continues to have limited verbal output secondary to large right MCA infarct.  - -ELOS/Goals: 27-30 days/min a Continue CIR PT, OT, SLP   2. Antithrombotics: -DVT/anticoagulation: Pharmaceutical: Lovenox -antiplatelet therapy: On ASA/Plavix 3. Pain Management: Tylenol prn. Sports creme for Right muscular thigh pain exercise related  4. Mood: LCSW to follow for evaluation and support.  -antipsychotic agents: N/A 5. Neuropsych: This patient is not fully capable of making decisions on his own behalf. 6. Skin/Wound Care: Routine pressure relief measures 7. Fluids/Electrolytes/Nutrition: Monitor I/Os. CMP ordered. 8. HTN: Monitor BP tid--Imdur, lisinopril, cardizem and amlodipine has been on hold.  Cardizem 30 mg qid  Vitals:   01/15/20 1931 01/16/20 0401  BP: 133/64 136/74  Pulse: 89 86  Resp: 16 18  Temp: 98.1 F (36.7 C) 97.9 F (36.6 C)  SpO2: 99% 96%  controlled 7/3 9. UTI: Resolved               10. COPD: Continue Pulmicort nebs bid. 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 To contact Dr. Katherina Right closer to discharge.  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 Change tube feeds to nights. Add water flushes. BMP ordered.             6/26- pt asking for water- can have Ice chips- hopefully giving some fluids will make her feel less dry             Advance diet as tolerated. 13. Insomnia?: Will monitor sleep wake cycle. iWas on ambien and trazodone PTA? Will order Ambien prn for now.   14. Azotemia due to dysphagia             6/26- will give NS IVFs due to BUN of 38-recheck labs Monday 15. Hyperglycemia             Due to TF, improved  16. Constipation Resolved, hold on  further PRN laxative  17.  Poor initiation cont methylphenidate trial , increase to 10mg    LOS: 8 days A FACE TO FACE EVALUATION WAS PERFORMED  Charlett Blake 01/16/2020, 9:29 AM

## 2020-01-17 ENCOUNTER — Inpatient Hospital Stay (HOSPITAL_COMMUNITY): Payer: Medicaid Other | Admitting: Occupational Therapy

## 2020-01-17 MED ORDER — ACETAMINOPHEN 325 MG PO TABS
650.0000 mg | ORAL_TABLET | Freq: Four times a day (QID) | ORAL | Status: DC
  Administered 2020-01-17 – 2020-01-20 (×12): 650 mg
  Filled 2020-01-17 (×12): qty 2

## 2020-01-17 NOTE — Progress Notes (Signed)
Occupational Therapy Session Note  Patient Details  Name: Rosabell Geyer MRN: [id number] Date of Birth: [birth year]  Today's Date: 01/17/2020 OT Individual Time: 1300-1343 OT Individual Time Calculation (min): 43 min   Short Term Goals: Week 1:  OT Short Term Goal 1 (Week 1): Pt will complete sit<>stand for ADL with max A. OT Short Term Goal 2 (Week 1): Pt will locate 3 self-care items on L side with mod cues. OT Short Term Goal 3 (Week 1): Pt will don shirt with mod A using hemi dressing techniques. OT Short Term Goal 4 (Week 1): Pt will demonstrate static sitting balance for 30+ seconds with CGA.  Skilled Therapeutic Interventions/Progress Updates:    Pt greeted in bed, on the phone with a family member. Pt passed the phone to OT and family member stated that pts leg was hurting her and she wanted some medicine from the nurse. OT relayed this to RN who provided pt with a topical analgesic and also oral pain medicine at start of session. Pt reported her brief was clean when asked however was found to be incontinent of urine. Max A of 1 to facilitate rolling Rt>Lt while 2nd helper assisted with perihygiene and brief change. Pt teary about pain during bed mobility. After a clean hospital gown was donned, pt requested to drink some water. With [facility] elevated pt drank 2 containers of nectar thickened water, facilitation needed to keep the container at midline as pt would pull it towards her Rt side and then swallow. Vcs and facilitation for slowing rate of consumption. Note that pt coughed 3x. At end of session pt remained in bed with all needs within reach and hemiplegic side protected, bed alarm set.   Therapy Documentation Precautions:  Precautions Precautions: Other (comment) Precaution Comments: L hemi Restrictions Weight Bearing Restrictions: No Vital Signs: Therapy Vitals Temp: 98.8 F (37.1 C) Pulse Rate: 71 Resp: 18 BP: 140/74 Patient Position (if appropriate): Sitting Oxygen  Therapy SpO2: 100 % O2 Device: Room Air ADL:        Therapy/Group: Individual Therapy  Keven Soucy A Firmin Belisle 01/17/2020, 3:31 PM

## 2020-01-17 NOTE — Progress Notes (Signed)
[place] PHYSICAL MEDICINE & REHABILITATION PROGRESS NOTE   Subjective/Complaints:   Alert but with poor concentration , no issues per RN, not using IV, still gets supplemental TF   Review of systems could not obtain secondary to mental status. Objective:   No results found. No results for input(s): WBC, HGB, HCT, PLT in the last 72 hours. No results for input(s): NA, K, CL, CO2, GLUCOSE, BUN, CREATININE, CALCIUM in the last 72 hours.  Intake/Output Summary (Last 24 hours) at 01/17/2020 0843 Last data filed at 01/17/2020 0800 Gross per 24 hour  Intake 3005 ml  Output --  Net 3005 ml     Physical Exam: Vital Signs Blood pressure (!) 144/72, pulse 90, temperature 99 F (37.2 C), temperature source Oral, resp. rate 16, SpO2 96 %.   General: No acute distress Mood and affect are appropriate Heart: Regular rate and rhythm no rubs murmurs or extra sounds Lungs: Clear to auscultation, breathing unlabored, no rales or wheezes Abdomen: Positive bowel sounds, soft nontender to palpation, nondistended Extremities: No clubbing, cyanosis, or edema Skin: No evidence of breakdown, no evidence of rash Neurologic: Cranial nerves II through XII intact, motor strength is 5/5 in right and 0/5 left deltoid, bicep, tricep, grip, hip flexor, knee extensors, ankle dorsiflexor and plantar flexor   Musculoskeletal: Full range of motion in all 4 extremities. No joint swelling No pain with RIght hip or knee ROM,     Assessment/Plan: 1. 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  which require 3+ hours per day of interdisciplinary therapy in a comprehensive inpatient rehab setting.  Physiatrist is providing close team supervision and 24 hour management of active medical problems listed below.  Physiatrist and rehab team continue to assess barriers to discharge/monitor patient progress toward functional and medical goals  Care Tool:  Bathing    Body parts bathed by patient: Face    Body parts bathed by helper: Right arm, Left arm, Right upper leg, Left upper leg, Right lower leg, Left lower leg     Bathing assist Assist Level: Minimal Assistance - Patient > 75%     Upper Body Dressing/Undressing Upper body dressing   What is the patient wearing?: Pull over shirt    Upper body assist Assist Level: Total Assistance - Patient < 25%    Lower Body Dressing/Undressing Lower body dressing      What is the patient wearing?: Incontinence brief, Pants     Lower body assist Assist for lower body dressing: 2 Helpers     Toileting Toileting    Toileting assist Assist for toileting: Dependent - Patient 0% (4 helpers using St[facility] and [facility])     Transfers Chair/bed transfer  Transfers assist     Chair/bed transfer assist level: Total Assistance - Patient < 25%     Locomotion Ambulation   Ambulation assist   Ambulation activity did not occur: Safety/medical concerns  Assist level: Total Assistance - Patient < 25% Assistive device:  (wall rail) Max distance: 3 steps   Walk 10 feet activity   Assist  Walk 10 feet activity did not occur: Safety/medical concerns        Walk 50 feet activity   Assist Walk 50 feet with 2 turns activity did not occur: Safety/medical concerns         Walk 150 feet activity   Assist Walk 150 feet activity did not occur: Safety/medical concerns         Walk 10 feet on uneven surface  activity  Assist Walk 10 feet on uneven surfaces activity did not occur: Safety/medical concerns         Wheelchair     Assist Will patient use wheelchair at discharge?: Yes Type of Wheelchair: Manual    Wheelchair assist level: Dependent - Patient 0% Max wheelchair distance: 150    Wheelchair 50 feet with 2 turns activity    Assist        Assist Level: Dependent - Patient 0%   Wheelchair 150 feet activity     Assist      Assist Level: Dependent - Patient 0%   Blood pressure (!) 144/72,  pulse 90, temperature 99 F (37.2 C), temperature source Oral, resp. rate 16, SpO2 96 %.  Medical Problem List and Plan: 1. Left hemiplegia with right gaze preference, has difficulty tracking beyond midline to the left and continues to have limited verbal output secondary to large right MCA infarct.  - -ELOS/Goals: 27-30 days/min a Continue CIR PT, OT, SLP   2. Antithrombotics: -DVT/anticoagulation: Pharmaceutical: Lovenox -antiplatelet therapy: On ASA/Plavix 3. Pain Management: Tylenol prn. Sports creme for Right muscular thigh pain exercise related  4. Mood: LCSW to follow for evaluation and support.  -antipsychotic agents: N/A 5. Neuropsych: This patient is not fully capable of making decisions on his own behalf. 6. Skin/Wound Care: Routine pressure relief measures 7. Fluids/Electrolytes/Nutrition: Monitor I/Os. CMP ordered.DC IV, Cont TF 678ml at noc  8. HTN: Monitor BP tid--Imdur, lisinopril, cardizem and amlodipine has been on hold.  Cardizem 30 mg qid  Vitals:   01/17/20 0520 01/17/20 0841  BP: 133/74 (!) 144/72  Pulse: 98 90  Resp:    Temp: 99 F (37.2 C)   SpO2: 96%   controlled 7/4 9. UTI: Resolved               10. COPD: Continue Pulmicort nebs bid. 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 To contact Dr. Katherina Right closer to discharge.  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 Change tube feeds to nights. Add water flushes. BMP ordered.             6/26- pt asking for water- can have Ice chips- hopefully giving some fluids will make her feel less dry             Advance diet as tolerated. 13. Insomnia?: Will monitor sleep wake cycle. iWas on ambien and trazodone PTA? Will order Ambien prn for now.   14. Azotemia due to dysphagia             6/26- will give NS IVFs due to BUN of 38-recheck labs Monday 15. Hyperglycemia             Due to TF, improved  16. Constipation  Resolved, hold on further PRN laxative  17.  Poor initiation cont methylphenidate trial , increase to 10mg  monitor effect   LOS: 9 days A FACE TO FACE EVALUATION WAS PERFORMED  Charlett Blake 01/17/2020, 8:43 AM

## 2020-01-18 ENCOUNTER — Inpatient Hospital Stay (HOSPITAL_COMMUNITY): Payer: Medicaid Other | Admitting: Occupational Therapy

## 2020-01-18 ENCOUNTER — Inpatient Hospital Stay (HOSPITAL_COMMUNITY): Payer: Medicaid Other | Admitting: Physical Therapy

## 2020-01-18 ENCOUNTER — Inpatient Hospital Stay (HOSPITAL_COMMUNITY): Payer: Medicaid Other

## 2020-01-18 DIAGNOSIS — M79672 Pain in left foot: Secondary | ICD-10-CM

## 2020-01-18 DIAGNOSIS — I10 Essential (primary) hypertension: Secondary | ICD-10-CM

## 2020-01-18 DIAGNOSIS — G479 Sleep disorder, unspecified: Secondary | ICD-10-CM

## 2020-01-18 DIAGNOSIS — R799 Abnormal finding of blood chemistry, unspecified: Secondary | ICD-10-CM

## 2020-01-18 LAB — BASIC METABOLIC PANEL
Anion gap: 7 (ref 5–15)
BUN: 28 mg/dL — ABNORMAL HIGH (ref 6–20)
CO2: 24 mmol/L (ref 22–32)
Calcium: 10.1 mg/dL (ref 8.9–10.3)
Chloride: 107 mmol/L (ref 98–111)
Creatinine, Ser: 0.9 mg/dL (ref 0.44–1.00)
GFR calc Af Amer: >60 mL/min (ref >60)
GFR calc non Af Amer: >60 mL/min (ref >60)
Glucose, Bld: 98 mg/dL (ref 70–99)
Potassium: 4.6 mmol/L (ref 3.5–5.1)
Sodium: 138 mmol/L (ref 135–145)

## 2020-01-18 LAB — CBC
HCT: 39.7 % (ref 36.0–46.0)
Hemoglobin: 12.7 g/dL (ref 12.0–15.0)
MCH: 28.3 pg (ref 26.0–34.0)
MCHC: 32 g/dL (ref 30.0–36.0)
MCV: 88.6 fL (ref 80.0–100.0)
Platelets: 320 10*3/uL (ref 150–400)
RBC: 4.48 MIL/uL (ref 3.87–5.11)
RDW: 12.3 % (ref 11.5–15.5)
WBC: 7.4 10*3/uL (ref 4.0–10.5)
nRBC: 0 % (ref 0.0–0.2)

## 2020-01-18 NOTE — Progress Notes (Signed)
Occupational Therapy Session Note  Patient Details  Name: Anne [name] MRN: [id number] Date of Birth: Dec 18, 1960  Today's Date: 01/18/2020 OT Individual Time: 1300-1356 OT Individual Time Calculation (min): 56 min    Short Term Goals: Week 1:  OT Short Term Goal 1 (Week 1): Pt will complete sit<>stand for ADL with max A. OT Short Term Goal 2 (Week 1): Pt will locate 3 self-care items on L side with mod cues. OT Short Term Goal 3 (Week 1): Pt will don shirt with mod A using hemi dressing techniques. OT Short Term Goal 4 (Week 1): Pt will demonstrate static sitting balance for 30+ seconds with CGA.  Skilled Therapeutic Interventions/Progress Updates:    Upon entering the room, pt supine in bed with gaze to the R with no c/o pain. Pt able to verbalized what she ate from lunch tray and confirmed with NT this session. Supine >sit with max A and max cuing for hand placement and technique. Pt seated on EOB with CGA - min A for static sitting balance 5 minutes. Max A stand pivot transfer with second person present and steady equipment. OT assisted pt via wheelchair to gym for craft task. Pt reaching across midline and to the L to obtain needed items to decorate table top activity. Pt needing max multimodal cuing and was internally and externally distracted this session. Pt assisted back to room via wheelchair and she was very emotional. Therapist unable to distinguish what she was upset about but then reports, " I want this tube out so I can eat some watermelon" and then " They took my blood. Do I have cancer?" OT needing to redirect pt and orient to situation. OT assisting pt in calling her daughter as well. Pt remained in tilt in space wheelchair with chair alarm belt donned for safety.   Therapy Documentation Precautions:  Precautions Precautions: Other (comment) Precaution Comments: L hemi Restrictions Weight Bearing Restrictions: No Pain: Pain Assessment Pain Score: 0-No  pain   Therapy/Group: Individual Therapy  [name] 01/18/2020, 2:55 PM

## 2020-01-18 NOTE — Progress Notes (Addendum)
Speech Language Pathology Daily Session Note  Patient Details  Name: Anne [name] MRN: [id number] Date of Birth: February 25, 1961  Today's Date: 01/18/2020 SLP Individual Time: 1004-1100 SLP Individual Time Calculation (min): 56 min  Short Term Goals: Week 2: SLP Short Term Goal 1 (Week 2): Pt will consume dys 1 textures and nectar thick liquids with min assist for use of swallowing precautions. SLP Short Term Goal 2 (Week 2): Pt will consume therapeutic trials of thin liquids without respiratory decompensation over 1 week prior to repeat instrumental assessment of swallowing function. SLP Short Term Goal 3 (Week 2): Pt will sustain her attention to basic functional tasks for 3-5 minute intervals with mod assist verbal cues for redirection. SLP Short Term Goal 4 (Week 2): Pt will complete basic, familiar tasks with mod assist multimodal cues for functional problem solving. SLP Short Term Goal 5 (Week 2): Pt will locate items to the left of midline during basic, famliar tasks with mod assist multimodal cues. SLP Short Term Goal 6 (Week 2): Pt will utilize increased vocal intensity and slow rate to achieve intelligibility at the word level with mod assist multimodal cues.  Skilled Therapeutic Interventions: Skilled ST services focused on cognitie skills. SLP facilitated basic problem solving, sustained attention and left visual scanning in card sorting task by two colors, pt demonstrated basic problem solving with min A verbal cues, max A verbal cues for error awareness and required max A verbal cues for sustained attention to task around 1 minute interval. Pt requested to use bedpan, nurse and SLP assisted transferring with stedy, pt required max A fade to mod A. Pt was unable to void given extended time, likely due to poor attention. Pt later requested to "clean my butt," indicting impairment in short term recall, SLP provided education. Pt pressed call bell x2 and was unable to verbalized her requested,  eventually pt requested to brush her teeth. SLP notified nursing staff to assistance in oral care due to limited time remaining in treatment session. SLP initiated water protocol to increase thin liquid trials, prior to a repeat MBS this week. Pt was left in room with call bell within reach and bed alarm set. ST recommends to continue skilled ST services.      Pain Pain Assessment Pain Score: 0-No pain  Therapy/Group: Individual Therapy  Vir Whetstine  [facility] 01/18/2020, 12:23 PM

## 2020-01-18 NOTE — Progress Notes (Signed)
Physical Therapy Session Note  Patient Details  Name: Anne [name] MRN: [id number] Date of Birth: Oct 13, 1960  Today's Date: 01/18/2020 PT Individual Time: 0805-0900 PT Individual Time Calculation (min): 55 min   Short Term Goals: Week 2:  PT Short Term Goal 1 (Week 2): Pt will transfer to and from med with max assist of 1 consistently PT Short Term Goal 2 (Week 2): Family will begin hands on education PT Short Term Goal 3 (Week 2): Pt will perform WC mobility with hemi technique with mod assist x 50 ft PT Short Term Goal 4 (Week 2): Pt will perform gait training with HW x 54ft and max assist +2.  Skilled Therapeutic Interventions/Progress Updates: Pt presented in bed agreeable to therapy. Pt performed rolling L modA x 1 and R maxA x 1 with PTA noting that pt was incontinent of bladder and had leaked through sheets. Pt performed rolling L/R in same manner to allow PTA to perform peri-care and don pants. As pt sheets wet pt performed supine to sit maxA x 1. Pt participated in sitting at EOB x 8 min with PTA sitting on R side and pt leaning into therapist to work on midline orientation. As pt and therapist waiting for second person to transfer to [place] pt engaged in turning head to midline however unable to maintain. Pt was able to sit at EOB for x 9 min with minA to CGA. Upon arrival of second person pt performed STS in [place] with maxA x 2. Pt noted to heavy L lean requiring PTA to stand on L to prevent LOB and work on midline. Once in TIS PTA assisted pt with feeding with PTA biasing food and tray to L to encourage scanning. Pt noted not to pocket any food but did require cues to slow pace and complete swallow before bringing more food to mouth. Pt then handed off to nsg to complete feeding.      Therapy Documentation Precautions:  Precautions Precautions: Other (comment) Precaution Comments: L hemi Restrictions Weight Bearing Restrictions: No General:   Vital Signs:   Pain: Pain  Assessment Pain Score: 0-No pain Mobility:   Locomotion :    Trunk/Postural Assessment :    Balance:   Exercises:   Other Treatments:      Therapy/Group: Individual Therapy  Ivadell Gaul 01/18/2020, 4:20 PM

## 2020-01-18 NOTE — Progress Notes (Signed)
Occupational Therapy Session Note  Patient Details  Name: Anne [name] MRN: [id number] Date of Birth: Jul 09, 1961  Today's Date: 01/18/2020 OT Individual Time: 7564-3329 OT Individual Time Calculation (min): 45 min    Short Term Goals: Week 1:  OT Short Term Goal 1 (Week 1): Pt will complete sit<>stand for ADL with max A. OT Short Term Goal 2 (Week 1): Pt will locate 3 self-care items on L side with mod cues. OT Short Term Goal 3 (Week 1): Pt will don shirt with mod A using hemi dressing techniques. OT Short Term Goal 4 (Week 1): Pt will demonstrate static sitting balance for 30+ seconds with CGA.  Skilled Therapeutic Interventions/Progress Updates: Patient weeping and leaning into right upper extremity sitting upright in chair upon therapy approach.   She acknowledged sadness, was given a moment to express it and then was encouraged with progress made by rehab tech and generally encouraged by this clinician.  Toherwise, she participated as follows:  Sit to stand at sink = max A x2 Static standing balance= max A x2.   She required lower exremity input to bear weight trhough left leg.  Dyamic stanidng balance, incorporating movements and reaching with his right upper extremity  in prep for self care and activities= moderate assistance x2    After standing approroximately 6 minutes, she began to lean heavily left but was able to mobilize back to middle with moderate assistance.  Otherwise, she completed bilateral actiivities, left upper extremity activities and motor planning with maximal assistance handover hand assitance for her hemiparesis left upper extremity   As well, she partcipated in cross midline activities, alternately with her upper extremitiies to work both sides of the brain simulataneously in prep for self care and functional activities.  She was left sitting up, slightly leaned back with safety belt on in her reclining wheel chair.  She demonstrated use of call  bell.  Continue OT Plan of Care     Therapy Documentation Precautions:  Precautions Precautions: Other (comment) Precaution Comments: L hemi Restrictions Weight Bearing Restrictions: No General:  Pain denied  Therapy/Group: Individual Therapy  Alfredia Ferguson [facility] 01/18/2020, 6:16 PM

## 2020-01-18 NOTE — Progress Notes (Signed)
[place] PHYSICAL MEDICINE & REHABILITATION PROGRESS NOTE   Subjective/Complaints: Patient seen sitting up in her chair this morning.  She states she slept well overnight.  She states a good weekend.  She states that she had some left foot pain, but that has improved.  Review of systems: Limited due to cognition, but appears to deny CP, shortness of breath, nausea, vomiting, diarrhea.  Objective:   No results found. Recent Labs    01/18/20 0745  WBC 7.4  HGB 12.7  HCT 39.7  PLT 320   Recent Labs    01/18/20 0745  NA 138  K 4.6  CL 107  CO2 24  GLUCOSE 98  BUN 28*  CREATININE 0.90  CALCIUM 10.1    Intake/Output Summary (Last 24 hours) at 01/18/2020 1624 Last data filed at 01/17/2020 2210 Gross per 24 hour  Intake 180 ml  Output --  Net 180 ml     Physical Exam: Vital Signs Blood pressure 134/76, pulse 82, temperature 98.1 F (36.7 C), resp. rate 18, SpO2 96 %. Constitutional: No distress . Vital signs reviewed. HENT: Normocephalic.  Atraumatic.  + NG. Eyes: EOMI. No discharge. Cardiovascular: No JVD. Respiratory: Normal effort.  No stridor. GI: Non-distended. Skin: Warm and dry.  Intact. Psych: Normal mood.  Normal behavior. Musc: No edema in extremities.  No tenderness in extremities. Neurologic: Alert Motor: RUE/RLE: 5/5 proximal distal LUE/LLE: 0/5 proximal to distal   Assessment/Plan: 1. 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  which require 3+ hours per day of interdisciplinary therapy in a comprehensive inpatient rehab setting.  Physiatrist is providing close team supervision and 24 hour management of active medical problems listed below.  Physiatrist and rehab team continue to assess barriers to discharge/monitor patient progress toward functional and medical goals  Care Tool:  Bathing    Body parts bathed by patient: Face   Body parts bathed by helper: Right arm, Left arm, Right upper leg, Left upper leg, Right lower leg, Left  lower leg     Bathing assist Assist Level: Minimal Assistance - Patient > 75%     Upper Body Dressing/Undressing Upper body dressing   What is the patient wearing?: Pull over shirt    Upper body assist Assist Level: Total Assistance - Patient < 25%    Lower Body Dressing/Undressing Lower body dressing      What is the patient wearing?: Incontinence brief, Pants     Lower body assist Assist for lower body dressing: 2 Helpers     Toileting Toileting    Toileting assist Assist for toileting: 2 Helpers     Transfers Chair/bed transfer  Transfers assist     Chair/bed transfer assist level: Total Assistance - Patient < 25%     Locomotion Ambulation   Ambulation assist   Ambulation activity did not occur: Safety/medical concerns  Assist level: Total Assistance - Patient < 25% Assistive device:  (wall rail) Max distance: 3 steps   Walk 10 feet activity   Assist  Walk 10 feet activity did not occur: Safety/medical concerns        Walk 50 feet activity   Assist Walk 50 feet with 2 turns activity did not occur: Safety/medical concerns         Walk 150 feet activity   Assist Walk 150 feet activity did not occur: Safety/medical concerns         Walk 10 feet on uneven surface  activity   Assist Walk 10 feet on uneven surfaces activity  did not occur: Safety/medical concerns         Wheelchair     Assist Will patient use wheelchair at discharge?: Yes Type of Wheelchair: Manual    Wheelchair assist level: Dependent - Patient 0% Max wheelchair distance: 150    Wheelchair 50 feet with 2 turns activity    Assist        Assist Level: Dependent - Patient 0%   Wheelchair 150 feet activity     Assist      Assist Level: Dependent - Patient 0%   Blood pressure 134/76, pulse 82, temperature 98.1 F (36.7 C), resp. rate 18, SpO2 96 %.  Medical Problem List and Plan: 1. Left hemiplegia with right gaze preference, has  difficulty tracking beyond midline to the left and continues to have limited verbal output secondary to large right MCA infarct.   Continue CIR  PRAFO/WHO qhs 2. Antithrombotics: -DVT/anticoagulation: Pharmaceutical: Lovenox -antiplatelet therapy: On ASA/Plavix 3. Pain Management: Tylenol prn. Sports creme for Right muscular thigh pain exercise related   Appears to be improving on 7/5 4. Mood: LCSW to follow for evaluation and support.  -antipsychotic agents: N/A 5. Neuropsych: This patient is not fully capable of making decisions on his own behalf. 6. Skin/Wound Care: Routine pressure relief measures 7. Fluids/Electrolytes/Nutrition: Monitor I/Os. CMP ordered.DC IV, Cont TF 673ml at noc  8. HTN: Monitor BP tid--Imdur, lisinopril, cardizem and amlodipine has been on hold.  Cardizem 30 mg qid  Vitals:   01/17/20 1900 01/18/20 0453  BP: (!) 129/53 134/76  Pulse: 88 82  Resp: 18 18  Temp: 98.2 F (36.8 C) 98.1 F (36.7 C)  SpO2: 98% 96%   Relatively controlled on 7/5 9. UTI: Resolved  10. COPD: Continue Pulmicort nebs bid. 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 To contact Dr. Katherina Right closer to discharge.  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 Change tube feeds to nights. Added water flushes.   Advance diet as tolerated. 13.  Sleep disturbance?: Will monitor sleep wake cycle. Was on ambien and trazodone PTA? Ambien prn for now.   Appears to be improving on 7/5  14. Azotemia due to dysphagia/elevated BUN            Improving on 7/5 15. Hyperglycemia             Due to TF, improved  16. Constipation Resolved, hold on further PRN laxative  17.  Poor initiation methylphenidate increased to 10mg  monitor effect   LOS: 10 days A FACE TO FACE EVALUATION WAS PERFORMED  Yanni Ruberg [name] 01/18/2020, 4:24 PM

## 2020-01-19 ENCOUNTER — Inpatient Hospital Stay (HOSPITAL_COMMUNITY): Payer: Medicaid Other | Admitting: Speech Pathology

## 2020-01-19 ENCOUNTER — Inpatient Hospital Stay (HOSPITAL_COMMUNITY): Payer: Medicaid Other | Admitting: *Deleted

## 2020-01-19 ENCOUNTER — Inpatient Hospital Stay (HOSPITAL_COMMUNITY): Payer: Medicaid Other | Admitting: Occupational Therapy

## 2020-01-19 NOTE — Progress Notes (Signed)
Speech Language Pathology Daily Session Note  Patient Details  Name: Anne [name] MRN: [id number] Date of Birth: July 04, 1961  Today's Date: 01/19/2020 SLP Individual Time: 1301-1404 SLP Individual Time Calculation (min): 63 min  Short Term Goals: Week 1: SLP Short Term Goal 1 (Week 2): Pt will consume dys 1 textures and nectar thick liquids with min assist for use of swallowing precautions. SLP Short Term Goal 2 (Week 2): Pt will consume therapeutic trials of thin liquids without respiratory decompensation over 1 week prior to repeat instrumental assessment of swallowing function. SLP Short Term Goal 3 (Week 2): Pt will sustain her attention to basic functional tasks for 3-5 minute intervals with mod assist verbal cues for redirection. SLP Short Term Goal 4 (Week 2): Pt will complete basic, familiar tasks with mod assist multimodal cues for functional problem solving. SLP Short Term Goal 5 (Week 2): Pt will locate items to the left of midline during basic, famliar tasks with mod assist multimodal cues. SLP Short Term Goal 6 (Week 2): Pt will utilize increased vocal intensity and slow rate to achieve intelligibility at the word level with mod assist multimodal cues.  Skilled Therapeutic Interventions: Pt was seen for skilled ST targeting dysphagia and cognition. Pt more engaged with therapist this PM (compared to earlier this AM). Upon arrival, she was upright in chair, still eating her lunch; pt's oral manipulation and transit time of puree lunch tray was far more efficient in comparison to breakfast this morning. She used her safe swallow strategies with only Supervision. No overt s/sx aspiration noted across puree or nectar thick liquid textures.  During a basic calendar making task, pt required Max A for scanning from left to right, and Mod A for functional problem solving, although she had no difficulty verbally sequencing numbers in correct order. Pt expressed need for bowel movement, therefore  SLP and NT assisted with functional transfer from chair to toilet via stedy. Pt required step-by-step verbal and visual cues for sequencing and initiation during transfers, and Max faded to Mod A for sustained attention to the task. Pt left in restroom with RN and NT present to assist with transfer back to chair. Continue per current plan of care.         Pain    Therapy/Group: Individual Therapy  Anne [name] 01/19/2020, 7:10 AM

## 2020-01-19 NOTE — Progress Notes (Signed)
Speech Language Pathology Daily Session Note  Patient Details  Name: Anne [name] MRN: [id number] Date of Birth: 1960/09/30  Today's Date: 01/19/2020 SLP Individual Time: 0727-0827 SLP Individual Time Calculation (min): 60 min  Short Term Goals: Week 2: SLP Short Term Goal 1 (Week 2): Pt will consume dys 1 textures and nectar thick liquids with min assist for use of swallowing precautions. SLP Short Term Goal 2 (Week 2): Pt will consume therapeutic trials of thin liquids without respiratory decompensation over 1 week prior to repeat instrumental assessment of swallowing function. SLP Short Term Goal 3 (Week 2): Pt will sustain her attention to basic functional tasks for 3-5 minute intervals with mod assist verbal cues for redirection. SLP Short Term Goal 4 (Week 2): Pt will complete basic, familiar tasks with mod assist multimodal cues for functional problem solving. SLP Short Term Goal 5 (Week 2): Pt will locate items to the left of midline during basic, famliar tasks with mod assist multimodal cues. SLP Short Term Goal 6 (Week 2): Pt will utilize increased vocal intensity and slow rate to achieve intelligibility at the word level with mod assist multimodal cues.  Skilled Therapeutic Interventions: Pt was seen for skilled ST targeting dysphagia and speech goals. SLP provided intermittent assistance with problem-solving self-feeding in the presence of NG tube. [name] oral transit was prolonged and demonstrated overall weak lingual manipulation of pureed solids from breakfast tray. However, oral clearance achieved. Recommend continue current diet. Pt with baseline audible congestion and intermittent weak cough/throat clearing, however unable to follow cues for a cough to attempt to orally expectorate any secretions. Spoke with RN and MD regarding perceived increased congestion. Pt was lethargic and with very flat affect throughout session, becoming intermittently labile. Mod A verbal cues were required  for implementation of increased vocal intensity to achieve 85% intelligibility at the word level when verbalizing family names from pictures. Pt left laying in bed with alarm set and needs within reach. Continue per current plan of care.        Pain Pain Assessment Pain Scale: 0-10 Pain Score: 0-No pain  Therapy/Group: Individual Therapy  Arbutus Leas 01/19/2020, 7:07 AM

## 2020-01-19 NOTE — Progress Notes (Signed)
Physical Therapy Session Note  Patient Details  Name: Anne [name] MRN: [id number] Date of Birth: [birth year]  Today's Date: 01/19/2020 PT Individual Time: 1000-1100 PT Individual Time Calculation (min): 60 min   Short Term Goals: Week 2:  PT Short Term Goal 1 (Week 2): Pt will transfer to and from med with max assist of 1 consistently PT Short Term Goal 2 (Week 2): Family will begin hands on education PT Short Term Goal 3 (Week 2): Pt will perform WC mobility with hemi technique with mod assist x 50 ft PT Short Term Goal 4 (Week 2): Pt will perform gait training with HW x 8f and max assist +2.  Skilled Therapeutic Interventions/Progress Updates: Pt presented in bed agreeable to therapy. Pt states pain in R leg and back muscle rub applied. Pt performed rolling L/R maxA as pants donned total A. Pt attempted to perform bridge to allow PTA to pull pants over hips. Pt was able to lift hips partially however required total A to complete. Performed supine to sit maxA, pt initially required minA but [place] to [place] Performed SB transfer to R with maxA x 2 with pt demonstrating initiation in leaning for positioning. Pt then requesting to wash face, pt provided with washcloth and turned to mirror to wash face requiring mod multimodal cues to clean L side of face. Pt then transported to ortho gym and performed SB transfer to R with maxA x 2. Once in sitting pt participated in scanning and reaching activities placing cards to back of mirror. Pt was able to name and appropriately place 9/9 cards but required verbal cues and increased time to complete task with moderately distracting environment. Pt attempted to participate in L lean for R trunk elongation however c/o back pain at R flank. PTA placed wedge under pt's L hip with pt performing L lean with improved tolerance. Performed SB transfer to L maxA x 2 however pt able to demonstrate some initiation to scoot. PTA noted decreased pushing overall this  session as compared to previous sessions. Pt transported back to room at end of session and remained in TIS with belt alarm on, call bell within reach and needs met.      Therapy Documentation Precautions:  Precautions Precautions: Other (comment) Precaution Comments: L hemi Restrictions Weight Bearing Restrictions: No General:   Vital Signs:   Pain: Pain Assessment Pain Scale: Faces Faces Pain Scale: Hurts a little bit Pain Type: Acute pain Pain Location: Throat Pain Orientation: Mid Pain Descriptors / Indicators: Sore Pain Onset: On-going Pain Intervention(s): RN made aware Multiple Pain Sites: No Mobility:   Locomotion :    Trunk/Postural Assessment :    Balance:   Exercises:   Other Treatments:      Therapy/Group: Individual Therapy  Daleigh Pollinger 01/19/2020, 4:24 PM

## 2020-01-19 NOTE — Progress Notes (Signed)
Calorie Count Note  RD was informed about an ongoing calorie count. RD collected all meal slips available on pt's door. Please see below for results.  RD originally consulted for 72-hour calorie count which was completed. Please see note dated 01/11/20 for details about that calorie count.  Diet: dysphagia 1, nectar-thick liquids Supplements: - Magic cup BID with meals, each supplement provides 290 kcal and 9 grams of protein - Vital Cuisine Shake BID, each supplement provides 520 kcal and 22 grams of protein   01/16/20: Breakfast: 479 kcal, 12 grams protein Lunch: 128 kcal, 9 grams protein Dinner: 619 kcal, 40 grams protein  01/16/20 Total intake: 1226 kcal (68% of minimum estimated needs)  61 grams protein (61% of minimum estimated needs)   01/17/20: Breakfast: 194 kcal, 6 grams protein Lunch: 188 kcal, 11 grams protein Dinner: 347 kcal, 12 grams protein  01/17/20 Total intake: 729 kcal (41% of minimum estimated needs)  40 grams protein (40% of minimum estimated needs)   01/18/20: Breakfast: 282 kcal, 9 grams protein Lunch: 540 kcal, 19 grams protein Dinner: 386 kcal, 0 grams protein  01/18/20 Total intake: 1208 kcal (67% of minimum estimated needs)  28 grams protein (28% of minimum estimated needs)   Nutrition Diagnosis: Inadequate oral intake related to dysphagia as evidenced by meal completion < 50%.  Goal: Patient will meet greater than or equal to 90% of their needs  Intervention: - Continue nocturnal tube feeds (please see follow-up note dated 7/6 for details) - Continue Magic Cup - Continue Vital Cuisine Shakes - Continue MVI with minerals daily   Gaynell Face, MS, RD, LDN Inpatient Clinical Dietitian Please see AMiON for contact information.

## 2020-01-19 NOTE — Progress Notes (Signed)
Occupational Therapy Weekly Progress Note  Patient Details  Name: Anne [name] MRN: [id number] Date of Birth: [birth year]  Beginning of progress report period: January 09, 2020 End of progress report period: January 19, 2020  Today's Date: 01/19/2020 OT Individual Time: 1505-1600 OT Individual Time Calculation (min): 55 min    Patient has met 1 of 4 short term goals. Pt making slow progress towards occupational therapy goals. Pt has made progress with slide board transfers being max A and has been able to stand with therapist with max A. Pt continues to need mod - max multimodal cuing for visual tasks and cognition for sequencing, initiation, visual scanning, problem solving, and redirection to tasks. Pt currently on night bath. L UE with significant sensation impairment and needing max cuing/total A for safety and placement of L UE. +2 assistance to utilize stedy for toilet transfer and hygiene. Pt can maintain static sitting balance with supervision - CGA for 5 minutes.   Patient continues to demonstrate the following deficits: muscle weakness, decreased cardiorespiratoy endurance, decreased coordination and decreased motor planning, field cut, decreased midline orientation, decreased attention to left and left side neglect, decreased initiation, decreased attention, decreased awareness, decreased problem solving, decreased safety awareness and delayed processing and decreased sitting balance, decreased standing balance, decreased postural control, hemiplegia and decreased balance strategies and therefore will continue to benefit from skilled OT intervention to enhance overall performance with BADL and Reduce care partner burden.  Patient progressing toward long term goals..  Continue plan of care.  OT Short Term Goals Week 1:  OT Short Term Goal 1 (Week 1): Pt will complete sit<>stand for ADL with max A. OT Short Term Goal 1 - Progress (Week 1): Not met OT Short Term Goal 2 (Week 1): Pt will locate  3 self-care items on L side with mod cues. OT Short Term Goal 2 - Progress (Week 1): Not met OT Short Term Goal 3 (Week 1): Pt will don shirt with mod A using hemi dressing techniques. OT Short Term Goal 3 - Progress (Week 1): Not met OT Short Term Goal 4 (Week 1): Pt will demonstrate static sitting balance for 30+ seconds with CGA. OT Short Term Goal 4 - Progress (Week 1): Met  Skilled Therapeutic Interventions/Progress Updates:    Upon entering the room, pt supine in bed with daughter present. Pt does report fatigue but is agreeable to OT intervention. Supine >sit on the R side of bed with max A. Max multimodal cuing for hand placement and technique. Pt does report headache and RN arrived with medication this session which pt took while seated on EOB. Static sitting balance for 5-7 minutes with close supervision - CGA. Pt engaged in Lateral leans L <> R towards therapist and then towards daughter and returning to midline with min A overall. Pt does appear distracted and needing cuing to return to task. Sit >supine with +2 for safety secondary to fatigue and for bed positioning. Caregiver was able to provide some information about her mom's regular behaviors and lifestyle that will be helpful to the team. OT also began discussing pt's progress and deficits with recommendation for SNF to continue therapy before returning home. Did discuss needing extensive education if pt was going to be returning home directly after this admission. Caregiver appeared receptive to information and asked appropriate questions. Call bell and all needed items within reach upon exiting the room.   Therapy Documentation Precautions:  Precautions Precautions: Other (comment) Precaution Comments: L hemi Restrictions Weight Bearing Restrictions:  No General:   Vital Signs: Therapy Vitals Temp: 98.9 F (37.2 C) Pulse Rate: 70 Resp: 16 BP: (!) 148/70 Patient Position (if appropriate): Lying Oxygen Therapy SpO2: 100  % O2 Device: Room Air Pain: Pain Assessment Pain Scale: Faces Faces Pain Scale: Hurts a little bit Pain Type: Acute pain Pain Location: Throat Pain Orientation: Mid Pain Descriptors / Indicators: Sore Pain Onset: On-going Pain Intervention(s): RN made aware Multiple Pain Sites: No ADL:   Vision   Perception    Praxis   Exercises:   Other Treatments:     Therapy/Group: Individual Therapy  ,  P 01/19/2020, 4:48 PM  

## 2020-01-19 NOTE — Progress Notes (Signed)
Nutrition Follow-up  DOCUMENTATION CODES:   Obesity unspecified  INTERVENTION:   - Please obtain updated weight, last weight is from 12/31/19  Based on results of 72-hour calorie count, recommend continuing nocturnal tube feeding via Cortrak: - Osmolite 1.5 @ 60 ml/hr for 10 hours from 1800 to 0400 (total of 600 ml) - Pro-stat 30 ml BID - Free water per MD/PA, currently 100 ml TID  Tube feeding regimen and current free water provides 1100 kcal, 68 grams of protein, and 757 ml of H2O (meets 61% of kcal needs and 68% of protein needs).  - Continue Magic cup BID with meals, each supplement provides 290 kcal and 9 grams of protein  - Continue Vital Cuisine Shake BID with meals, each supplement provides 520 kcal and 22 grams of protein  - Continue MVI with minerals daily  NUTRITION DIAGNOSIS:   Inadequate oral intake related to dysphagia as evidenced by meal completion < 50%.  Progressing  GOAL:   Patient will meet greater than or equal to 90% of their needs  Progressing  MONITOR:   PO intake, Supplement acceptance, Diet advancement, Labs, TF tolerance  REASON FOR ASSESSMENT:   Consult Calorie Count  ASSESSMENT:   59 yo female admitted to Rehab from [facility] S/P stroke with functional deficits. PMH includes CAD, coronary vasospasms, asthma, tobacco abuse.  6/17 - s/p thrombectomy 6/18 - Cortrak placed 6/24 - s/p MBS, diet advance to dysphagia 1 with nectar-thick liquids 6/25 - transferred to CIR, transitioned to nocturnal TF  Cortrak remains in place. Pt continues to receive nocturnal TF due to poor PO intake.  RD was unable to meet with pt today. Pt receiving personal care on all attempted visits.  Spoke with RN. Calorie count ordered under nursing care orders. Discussed with PA who approved discontinuing this order as it has been in place since 6/25. Communicated plan with RN. Please see note from earlier today for calorie count results for 7/3-7/5.  RN reports pt  continues to eat very little and continues to have a poor appetite despite Megace.  Tube feeding: Osmolite 1.5 @ 60 ml/hr x 10 hours, Pro-stat 30 ml BID, free water 100 ml TID  Meal Completion: 15-80% x last 8 recorded meals  Medications reviewed and include: folic acid, Megace BID, ritalin, MVI with minerals, thiamine  Labs reviewed.   NUTRITION - FOCUSED PHYSICAL EXAM:  Unable to complete at this time.  Diet Order:   Diet Order            DIET - DYS 1 Room service appropriate? Yes; Fluid consistency: Nectar Thick  Diet effective now                 EDUCATION NEEDS:   No education needs have been identified at this time  Skin:  Skin Assessment: Skin Integrity Issues: Incisions: right groin  Last BM:  01/16/20 medium type 6  Height:   Ht Readings from Last 1 Encounters:  12/31/19 5\' 6"  (1.676 m)    Weight:   Wt Readings from Last 1 Encounters:  12/31/19 91.4 kg    Ideal Body Weight:  59.1 kg  Estimated Nutritional Needs:   Kcal:  1800-2000  Protein:  100-120 gm  Fluid:  >/= 1.8 L    Gaynell Face, MS, RD, LDN Inpatient Clinical Dietitian Please see AMiON for contact information.

## 2020-01-19 NOTE — Progress Notes (Signed)
[facility] PHYSICAL MEDICINE & REHABILITATION PROGRESS NOTE   Subjective/Complaints: Per RN and SLP sounds "wet " this am , denies breathing   Review of systems: Limited due to cognition, but appears to deny CP, shortness of breath, nausea, vomiting, diarrhea.  Objective:   No results found. Recent Labs    01/18/20 0745  WBC 7.4  HGB 12.7  HCT 39.7  PLT 320   Recent Labs    01/18/20 0745  NA 138  K 4.6  CL 107  CO2 24  GLUCOSE 98  BUN 28*  CREATININE 0.90  CALCIUM 10.1    Intake/Output Summary (Last 24 hours) at 01/19/2020 0816 Last data filed at 01/18/2020 2156 Gross per 24 hour  Intake 50 ml  Output --  Net 50 ml     Physical Exam: Vital Signs Blood pressure 138/80, pulse 85, temperature 98.4 F (36.9 C), resp. rate 18, SpO2 93 %.  General: No acute distress Mood and affect are appropriate Heart: Regular rate and rhythm no rubs murmurs or extra sounds Lungs: Clear to auscultation, breathing unlabored, no rales or wheezes Abdomen: Positive bowel sounds, soft nontender to palpation, nondistended Extremities: No clubbing, cyanosis, or edema Skin: No evidence of breakdown, no evidence of rash  Motor: RUE/RLE: 5/5 proximal distal LUE/LLE: 2-/5 left biceps and finger flexors, 2- L hip/knee synergy   Assessment/Plan: 1. 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  which require 3+ hours per day of interdisciplinary therapy in a comprehensive inpatient rehab setting.  Physiatrist is providing close team supervision and 24 hour management of active medical problems listed below.  Physiatrist and rehab team continue to assess barriers to discharge/monitor patient progress toward functional and medical goals  Care Tool:  Bathing    Body parts bathed by patient: Face   Body parts bathed by helper: Right arm, Left arm, Right upper leg, Left upper leg, Right lower leg, Left lower leg     Bathing assist Assist Level: Minimal Assistance - Patient > 75%      Upper Body Dressing/Undressing Upper body dressing   What is the patient wearing?: Pull over shirt    Upper body assist Assist Level: Total Assistance - Patient < 25%    Lower Body Dressing/Undressing Lower body dressing      What is the patient wearing?: Incontinence brief, Pants     Lower body assist Assist for lower body dressing: 2 Helpers     Toileting Toileting    Toileting assist Assist for toileting: 2 Helpers     Transfers Chair/bed transfer  Transfers assist     Chair/bed transfer assist level: Total Assistance - Patient < 25%     Locomotion Ambulation   Ambulation assist   Ambulation activity did not occur: Safety/medical concerns  Assist level: Total Assistance - Patient < 25% Assistive device:  (wall rail) Max distance: 3 steps   Walk 10 feet activity   Assist  Walk 10 feet activity did not occur: Safety/medical concerns        Walk 50 feet activity   Assist Walk 50 feet with 2 turns activity did not occur: Safety/medical concerns         Walk 150 feet activity   Assist Walk 150 feet activity did not occur: Safety/medical concerns         Walk 10 feet on uneven surface  activity   Assist Walk 10 feet on uneven surfaces activity did not occur: Safety/medical concerns         Wheelchair  Assist Will patient use wheelchair at discharge?: Yes Type of Wheelchair: Manual    Wheelchair assist level: Dependent - Patient 0% Max wheelchair distance: 150    Wheelchair 50 feet with 2 turns activity    Assist        Assist Level: Dependent - Patient 0%   Wheelchair 150 feet activity     Assist      Assist Level: Dependent - Patient 0%   Blood pressure 138/80, pulse 85, temperature 98.4 F (36.9 C), resp. rate 18, SpO2 93 %.  Medical Problem List and Plan: 1. Left hemiplegia with right gaze preference, has difficulty tracking beyond midline to the left and continues to have limited verbal  output secondary to large right MCA infarct.   Continue CIR, team conf in am , some improved movement Left side   PRAFO/WHO qhs 2. Antithrombotics: -DVT/anticoagulation: Pharmaceutical: Lovenox -antiplatelet therapy: On ASA/Plavix 3. Pain Management: Tylenol prn. Sports creme for Right muscular thigh pain exercise related   Appears to be improving on 7/5 4. Mood: LCSW to follow for evaluation and support.  -antipsychotic agents: N/A 5. Neuropsych: This patient is not fully capable of making decisions on his own behalf. 6. Skin/Wound Care: Routine pressure relief measures 7. Fluids/Electrolytes/Nutrition: Monitor I/Os. CMP ordered.DC IV, Cont TF 620ml at noc  8. HTN: Monitor BP tid--Imdur, lisinopril, cardizem and amlodipine has been on hold.  Cardizem 30 mg qid  Vitals:   01/18/20 2018 01/19/20 0434  BP: 127/72 138/80  Pulse: 72 85  Resp: 16 18  Temp: 97.9 F (36.6 C) 98.4 F (36.9 C)  SpO2: 100% 93%   Relatively controlled on 7/6 9. UTI: Resolved  10. COPD: Continue Pulmicort nebs bid. 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 To contact Dr. Katherina Right closer to discharge.  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 Change tube feeds to nights. Added water flushes.   Advance diet as tolerated. Wet voice, afebrile , normal WBCs 7/5, no resp distress, paryngeal secreations, enc cough  13.  Sleep disturbance?: Will monitor sleep wake cycle. Was on ambien and trazodone PTA? Ambien prn for now.   Appears to be improving on 7/5  14. Azotemia due to dysphagia/elevated BUN            Improving on 7/5 15. Hyperglycemia             Due to TF, improved  16. Constipation Resolved, hold on further PRN laxative  17.  Poor initiation methylphenidate increased to 10mg  monitor effect   LOS: 11 days A FACE TO FACE EVALUATION WAS PERFORMED  Charlett Blake 01/19/2020, 8:16 AM

## 2020-01-20 ENCOUNTER — Inpatient Hospital Stay (HOSPITAL_COMMUNITY): Payer: Medicaid Other | Admitting: Occupational Therapy

## 2020-01-20 ENCOUNTER — Inpatient Hospital Stay (HOSPITAL_COMMUNITY): Payer: Medicaid Other | Admitting: Physical Therapy

## 2020-01-20 ENCOUNTER — Inpatient Hospital Stay (HOSPITAL_COMMUNITY): Payer: Medicaid Other | Admitting: Speech Pathology

## 2020-01-20 MED ORDER — CLOPIDOGREL BISULFATE 75 MG PO TABS
75.0000 mg | ORAL_TABLET | Freq: Every day | ORAL | Status: DC
  Administered 2020-01-21 – 2020-02-16 (×27): 75 mg via ORAL
  Filled 2020-01-20 (×27): qty 1

## 2020-01-20 MED ORDER — ADULT MULTIVITAMIN W/MINERALS CH
1.0000 | ORAL_TABLET | Freq: Every day | ORAL | Status: DC
  Administered 2020-01-21 – 2020-02-16 (×27): 1 via ORAL
  Filled 2020-01-20 (×27): qty 1

## 2020-01-20 MED ORDER — PRO-STAT SUGAR FREE PO LIQD
30.0000 mL | Freq: Two times a day (BID) | ORAL | Status: DC
  Administered 2020-01-20 – 2020-02-09 (×40): 30 mL via ORAL
  Filled 2020-01-20 (×40): qty 30

## 2020-01-20 MED ORDER — DILTIAZEM HCL 60 MG PO TABS
30.0000 mg | ORAL_TABLET | Freq: Four times a day (QID) | ORAL | Status: DC
  Administered 2020-01-20 – 2020-02-16 (×103): 30 mg via ORAL
  Filled 2020-01-20 (×108): qty 1

## 2020-01-20 MED ORDER — FOLIC ACID 1 MG PO TABS
1.0000 mg | ORAL_TABLET | Freq: Every day | ORAL | Status: DC
  Administered 2020-01-21 – 2020-02-16 (×27): 1 mg via ORAL
  Filled 2020-01-20 (×29): qty 1

## 2020-01-20 MED ORDER — TRAZODONE HCL 50 MG PO TABS
25.0000 mg | ORAL_TABLET | Freq: Every evening | ORAL | Status: DC | PRN
  Administered 2020-01-24 – 2020-02-14 (×10): 50 mg via ORAL
  Filled 2020-01-20 (×12): qty 1

## 2020-01-20 MED ORDER — LORATADINE 10 MG PO TABS: 10.0000 mg | ORAL_TABLET | Freq: Every day | ORAL | Status: DC | PRN

## 2020-01-20 MED ORDER — ALUM & MAG HYDROXIDE-SIMETH 200-200-20 MG/5ML PO SUSP
30.0000 mL | ORAL | Status: DC | PRN
  Administered 2020-02-02: 30 mL via ORAL
  Filled 2020-01-20 (×2): qty 30

## 2020-01-20 MED ORDER — SENNOSIDES 8.8 MG/5ML PO SYRP
10.0000 mL | ORAL_SOLUTION | Freq: Every evening | ORAL | Status: DC | PRN
  Administered 2020-01-25: 10 mL via ORAL
  Filled 2020-01-20 (×2): qty 10

## 2020-01-20 MED ORDER — DIPHENHYDRAMINE HCL 12.5 MG/5ML PO ELIX: 12.5000 mg | ORAL_SOLUTION | Freq: Four times a day (QID) | ORAL | Status: DC | PRN

## 2020-01-20 MED ORDER — MEGESTROL ACETATE 400 MG/10ML PO SUSP
400.0000 mg | Freq: Two times a day (BID) | ORAL | Status: DC
  Administered 2020-01-20 – 2020-02-16 (×54): 400 mg via ORAL
  Filled 2020-01-20 (×54): qty 10

## 2020-01-20 MED ORDER — ASPIRIN 325 MG PO TABS
325.0000 mg | ORAL_TABLET | Freq: Every day | ORAL | Status: DC
  Administered 2020-01-21 – 2020-02-16 (×27): 325 mg via ORAL
  Filled 2020-01-20 (×27): qty 1

## 2020-01-20 MED ORDER — THIAMINE HCL 100 MG PO TABS
100.0000 mg | ORAL_TABLET | Freq: Every day | ORAL | Status: DC
  Administered 2020-01-21 – 2020-02-16 (×27): 100 mg via ORAL
  Filled 2020-01-20 (×27): qty 1

## 2020-01-20 MED ORDER — ACETAMINOPHEN 325 MG PO TABS
650.0000 mg | ORAL_TABLET | Freq: Four times a day (QID) | ORAL | Status: DC
  Administered 2020-01-20 – 2020-02-16 (×107): 650 mg via ORAL
  Filled 2020-01-20 (×109): qty 2

## 2020-01-20 MED ORDER — HYDRALAZINE HCL 10 MG PO TABS: 10.0000 mg | ORAL_TABLET | Freq: Four times a day (QID) | ORAL | Status: DC | PRN

## 2020-01-20 MED ORDER — SENNOSIDES 8.8 MG/5ML PO SYRP
10.0000 mL | ORAL_SOLUTION | Freq: Every evening | ORAL | Status: DC | PRN
  Filled 2020-01-20: qty 10

## 2020-01-20 NOTE — Progress Notes (Signed)
Physical Therapy Session Note  Patient Details  Name: Anne [name] MRN: [id number] Date of Birth: 15-Mar-1961  Today's Date: 01/20/2020 PT Individual Time: 0805-0900 PT Individual Time Calculation (min): 55 min   Short Term Goals: Week 2:  PT Short Term Goal 1 (Week 2): Pt will transfer to and from med with max assist of 1 consistently PT Short Term Goal 2 (Week 2): Family will begin hands on education PT Short Term Goal 3 (Week 2): Pt will perform WC mobility with hemi technique with mod assist x 50 ft PT Short Term Goal 4 (Week 2): Pt will perform gait training with HW x 88f and max assist +2.  Skilled Therapeutic Interventions/Progress Updates: Pt presented in bed agreeable to therapy. Pt noted to be incontinent of bowel and bladder once sheet removed. Pt performed rolling modA to L maxA to R to allow for peri-care and donning of new brief/pants. Once completed performed supine to sit maxA x 1. Pt was CGA sitting EOB. Performed SB transfer to TIS to R maxA x 1. Pt transported to day room and performed SB transfer to R onto mat maxA x 1. Pt participated in x 2 bouts of horseshoes with wedge placed under L hip for improved alignment. Pt required moderate cues to scan to L however was able to reach moderate challenges and return to midline with CGA. Pt returned to TIS via SB transfer to L maxA x 2. Pt transported back to room and remained in TIS with belt alarm on, call bell within reach and needs met.      Therapy Documentation Precautions:  Precautions Precautions: Other (comment) Precaution Comments: L hemi Restrictions Weight Bearing Restrictions: No General:   Vital Signs: Therapy Vitals Pulse Rate: 80 BP: 132/74    Therapy/Group: Individual Therapy  Arhum Peeples  Caroleen Stoermer, PTA  01/20/2020, 3:05 PM

## 2020-01-20 NOTE — Progress Notes (Addendum)
Patient ID: Anne [name], female   DOB: 02-10-1961, 59 y.o.   MRN: [id number]   SNF options provided to daughter. Daughter would like to pursue [facility] in [place]. SW will follow up!

## 2020-01-20 NOTE — Progress Notes (Signed)
Speech Language Pathology Daily Session Note  Patient Details  Name: Anne [name] MRN: [id number] Date of Birth: 04/04/1961  Today's Date: 01/20/2020 SLP Individual Time: 1345-1430 SLP Individual Time Calculation (min): 45 min  Short Term Goals: Week 2: SLP Short Term Goal 1 (Week 2): Pt will consume dys 1 textures and nectar thick liquids with min assist for use of swallowing precautions. SLP Short Term Goal 2 (Week 2): Pt will consume therapeutic trials of thin liquids without respiratory decompensation over 1 week prior to repeat instrumental assessment of swallowing function. SLP Short Term Goal 3 (Week 2): Pt will sustain her attention to basic functional tasks for 3-5 minute intervals with mod assist verbal cues for redirection. SLP Short Term Goal 4 (Week 2): Pt will complete basic, familiar tasks with mod assist multimodal cues for functional problem solving. SLP Short Term Goal 5 (Week 2): Pt will locate items to the left of midline during basic, famliar tasks with mod assist multimodal cues. SLP Short Term Goal 6 (Week 2): Pt will utilize increased vocal intensity and slow rate to achieve intelligibility at the word level with mod assist multimodal cues.  Skilled Therapeutic Interventions: Skilled treatment session focused on dysphagia and cognitive goals. Upon arrival, patient's daughter was present and assisting the patient with her lunch meal. Patient consumed her lunch meal of Dys. 1 textures with nectar-thick liquids without overt s/s of aspiration and Mod A verbal cues for use of swallowing compensatory strategies. Patient ate all of the "cold" food items on her tray due to mouth sores. Recommend oral care after medication administration to help reduce residue (patient attributes sores in mouth to residue from medications). SLP also recommends pulling the cortrac due to the distraction that it causes, repeat an MBS on Friday to assess swallow function and possible diet initiation and  allow patient the weekend to improve PO intake. Discussed with family and PA.  Patient participated in a video chat with her grandson and scanned to the left of midline to locate the video with Min A verbal cues.  Patient left upright in wheelchair with alarm on and all needs within reach. Continue with current plan of care.      Pain No/Denies Pain   Therapy/Group: Individual Therapy  Robby Bulkley 01/20/2020, 3:03 PM

## 2020-01-20 NOTE — Progress Notes (Signed)
Occupational Therapy Session Note  Patient Details  Name: Anne [name] MRN: [id number] Date of Birth: [birth year]  Today's Date: 01/20/2020 OT Individual Time: 3903-0092 OT Individual Time Calculation (min): 41 min    Short Term Goals: Week 2:  OT Short Term Goal 1 (Week 2): Pt will transfer onto commode with assist of 1 person. OT Short Term Goal 2 (Week 2): Pt will don UB clothing with mod A overall. OT Short Term Goal 3 (Week 2): Pt will locate 3 self care items to the L with mod cuing.  Skilled Therapeutic Interventions/Progress Updates:    Upon entering the room, pt seated in tilt in space wheelchair and had removed R arm rest from chair with pt reporting, " I'm trying to get to the dresser". Pt unable to verbalize what she needs from the dresser. Pt very tearful throughout session and does not verbalize if she is in pain or what she is upset over. Pt seated in chair at high low table to focus on L UE self ROM and table slide exercises. Pt initially hand over hand assistance and then max verbal cuing for visual scanning to focus on L UE with exercises. Pt appearing very  Internally distracted this session as she was in quiet room with minimal external distractions. OT assisted pt back to room via wheelchair. OT asking pt if she needed to use the bathroom and she indicated "no". OT using stedy to assist pt to toilet with total A +2. Pt needing total A to stand from chair or commode but mod - max A to stand from elevated stedy seat. Pt unable to void after 15 minutes and brief was wet with pt being unaware. OT assists pt with hygiene and she requests to return to wheelchair. Chair alarm belt donned and call bell within reach. OT assisted pt with calling her daughter before exiting the room.   Therapy Documentation Precautions:  Precautions Precautions: Other (comment) Precaution Comments: L hemi Restrictions Weight Bearing Restrictions: No Vital Signs: Therapy Vitals Pulse Rate: 80 BP:  132/74   Therapy/Group: Individual Therapy  [name] 01/20/2020, 12:36 PM

## 2020-01-20 NOTE — Progress Notes (Signed)
Cortrak removed, no complications with removal.  Anne [name]

## 2020-01-20 NOTE — Patient Care Conference (Signed)
Inpatient RehabilitationTeam Conference and Plan of Care Update Date: 01/20/2020   Time: 1:19 PM    Patient Name: Anne [name]      Medical Record Number: [id number]  Date of Birth: January 07, 1961 Sex: Female         Room/Bed: 4W18C/4W18C-01 Payor Info: Payor:  [place] / Plan: [place] / Product Type: *No Product type* /    Admit Date/Time:  01/08/2020  4:39 PM  Primary Diagnosis:  Acute right MCA stroke [facility])  Hospital Problems: Principal Problem:   Acute right MCA stroke ([place]) Active Problems:   Elevated BUN   Sleep disturbance   Left foot pain    Expected Discharge Date: Expected Discharge Date: 02/05/20 (home vs SNF)  Team Members Present: Physician leading conference: Dr. Alysia Penna Nurse Present: Rayne Du, LPN PT Present: Phylliss Bob, PTA;Barrie Folk, PT OT Present: Darleen Crocker, OT SLP Present: [place] Anna, SLP PPS Coordinator present : Gunnar Fusi, SLP     Current Status/Progress Goal Weekly Team Focus  Bowel/Bladder   Pt incontinet of bowel and bladder. At times able to advise she  has to pee but it is too urgent to react.   become continent of bowel and vladder   assess q shift and prn, toilette with bed pan q 2 hours while awake    Swallow/Nutrition/ Hydration   dysphagia 1 textures, nectar thick liquids, Min-Mod A  Supervision  independence with use of swallowing strategies, trials upgraded solids and liquids   ADL's   max A - total A slide board transfer/squat pivot, stedy +2 toilet transfer/toileting, Max UB self care, total A LB self care  mod A overall  self care retraining, balance, functional transfers, L NMR, visual scanning/inattention   Mobility   maxA bed mobility, maxA x 2 STS three muskateers style or stedy, maxA x 2 SB transfer, maxA gait 68ft at wall rail with w/c  mod A w/c level  midline orientation, sitting balance, transfers, L NMR   Communication   Min-Mod A intelligibility  Min A   carryover speech intelligibility strategies   Safety/Cognition/ Behavioral Observations  Mod-Max A  Min A  visual scanning, attention, recall, functional problem solving   Pain   pt complains of pain in right and left thigh and hip. faces scale at hurts alot   decrease amount of pain   assess pain q shift and prn, pt recieving tylenol and bengay    Skin   no breakdown at this time  remain free of infection and skin break down  assess skin q shift and prn      Team Discussion:  Discharge Planning/Teaching Needs:  DTR goal to d/c patient home. SNF reccommended  Will schedule a week session of faily education   Current Update: on target  Current Barriers to Discharge:  Lack of/limited family support  *Nutritional means * Incontinence Possible Resolutions to Barriers: *Discussed burden of care at discharge with moderate assist goals may be too much for young adults to manage when daughter is at work; questioned additional care giver availability vs SNF placement. *D1 Nectar diet with poor appetite; supplemented with HS Tube Feeds. SLP request for CXR due to congested cough. Hesitant to initiate water protocol due to congestion. Megace for appetite and Ritalin for activation.  *Timed toileting initiated and reminders to heed urge to eliminate bowel/bladder  Patient on target to meet rehab goals: yes, improved initiation, scoots for transfers, slow progress overall impaired by fatigue.  *See Care  Plan and progress notes for long and short-term goals.   Revisions to Treatment Plan:      Medical Summary Current Status: Right thigh pain improved, still with poor intake, decreased level of alertness Weekly Focus/Goal: Improve intake  Barriers to Discharge: Medical stability   Possible Resolutions to Barriers: Cont megace dn methylphenidate but may not be able to maintain po intake due to cognition   Continued Need for Acute Rehabilitation Level of Care: The patient requires daily  medical management by a physician with specialized training in physical medicine and rehabilitation for the following reasons: Direction of a multidisciplinary physical rehabilitation program to maximize functional independence : Yes Medical management of patient stability for increased activity during participation in an intensive rehabilitation regime.: Yes Analysis of laboratory values and/or radiology reports with any subsequent need for medication adjustment and/or medical intervention. : Yes   I attest that I was present, lead the team conference, and concur with the assessment and plan of the team.   Dorien Chihuahua B 01/20/2020, 1:19 PM

## 2020-01-20 NOTE — NC FL2 (Addendum)
[place] MEDICAID FL2 LEVEL OF CARE SCREENING TOOL     IDENTIFICATION  Patient Name: Anne [name] Birthdate: April 10, 1961 Sex: female Admission Date (Current Location): 01/08/2020  [facility] and [place] Number:  Engineering geologist and Address:  [facility] [place], [place] [address] [place], [place] [address]      Provider Number:    Attending Physician Name and Address:  Charlett Blake, MD  Relative Name and Phone Number:  Sumayyah Custodio [phone]    Current Level of Care: Hospital Recommended Level of Care: [place] Prior Approval Number:    Date Approved/Denied:   PASRR Number:   [id number]  Discharge Plan: SNF    Current Diagnoses: Patient Active Problem List   Diagnosis Date Noted   Elevated BUN    Sleep disturbance    Left foot pain    Acute right MCA stroke ([place]) 01/08/2020   Acute lower UTI    Left hemiplegia ([place])    Acute ischemic stroke ([place])    Dysphagia, post-stroke    Hyperglycemia    Chronic obstructive pulmonary disease ([place])    Acute respiratory failure with hypoxia ([place])    Stroke (cerebrum) ([place]) 12/31/2019   CAD (coronary artery disease)    Polysubstance abuse ([place])    Hypertension    Asthma    Coronary vasospasm ([place])    NSTEMI (non-ST elevated myocardial infarction) ([place]) 11/25/2014   Chest pain 11/24/2014    Orientation RESPIRATION BLADDER Height & Weight     Self, Time, Situation  Normal Incontinent Weight:   Height:     BEHAVIORAL SYMPTOMS/MOOD NEUROLOGICAL BOWEL NUTRITION STATUS      Incontinent Feeding tube (Plans to discharge tub feeding on Friday)  AMBULATORY STATUS COMMUNICATION OF NEEDS Skin   Extensive Assist Verbally Normal                       Personal Care Assistance Level of Assistance  Bathing, Feeding, Dressing, Total care Bathing Assistance: Maximum assistance Feeding assistance: Maximum assistance Dressing Assistance:  Maximum assistance Total Care Assistance: Maximum assistance   Functional Limitations Info             SPECIAL CARE FACTORS FREQUENCY  Bowel and bladder program                    Contractures Contractures Info: Not present    Additional Factors Info                  Current Medications (01/20/2020):  This is the current hospital active medication list Current Facility-Administered Medications  Medication Dose Route Frequency Provider Last Rate Last Admin   acetaminophen (TYLENOL) tablet 650 mg  650 mg Per Tube Q6H Kirsteins, Luanna Salk, MD   650 mg at 01/20/20 1155   albuterol (PROVENTIL) (2.5 MG/3ML) 0.083% nebulizer solution 2.5 mg  2.5 mg Nebulization Q4H PRN Bary Leriche, PA-C   2.5 mg at 01/13/20 1851   alum & mag hydroxide-simeth (MAALOX/MYLANTA) 200-200-20 MG/5ML suspension 30 mL  30 mL Per Tube Q4H PRN Love, Pamela S, PA-C       aspirin tablet 325 mg  325 mg Per Tube Daily Bary Leriche, PA-C   325 mg at 01/20/20 0752   atorvastatin (LIPITOR) tablet 40 mg  40 mg Per Tube Daily Bary Leriche, PA-C   40 mg at 01/20/20 0751   bisacodyl (DULCOLAX) suppository 10 mg  10 mg Rectal Daily PRN  Love, Pamela S, PA-C       Chlorhexidine Gluconate Cloth 2 % PADS 6 each  6 each Topical Q0600 Bary Leriche, PA-C   6 each at 01/16/20 0445   clopidogrel (PLAVIX) tablet 75 mg  75 mg Per Tube Daily Bary Leriche, PA-C   75 mg at 01/20/20 0751   diltiazem (CARDIZEM) tablet 30 mg  30 mg Per Tube Q6H [place], Yujing Z, RPH   30 mg at 01/20/20 1155   diphenhydrAMINE (BENADRYL) 12.5 MG/5ML elixir 12.5-25 mg  12.5-25 mg Per Tube Q6H PRN Love, Pamela S, PA-C       enoxaparin (LOVENOX) injection 40 mg  40 mg Subcutaneous Q24H Love, Pamela S, PA-C   40 mg at 01/19/20 2237   feeding supplement (OSMOLITE 1.5 CAL) liquid 600 mL  600 mL Per Tube Q24H Charlett Blake, MD   Stopped at 01/20/20 0545   feeding supplement (PRO-STAT SUGAR FREE 64) liquid 30 mL  30 mL Per Tube BID  Bary Leriche, PA-C   30 mL at 53/97/67 3419   folic acid (FOLVITE) tablet 1 mg  1 mg Per Tube Daily Bary Leriche, PA-C   1 mg at 01/20/20 3790   free water 100 mL  100 mL Per Tube TID with meals Bary Leriche, PA-C   100 mL at 01/20/20 1157   guaiFENesin-dextromethorphan (ROBITUSSIN DM) 100-10 MG/5ML syrup 5-10 mL  5-10 mL Per Tube Q6H PRN Bary Leriche, PA-C   10 mL at 01/19/20 2233   hydrALAZINE (APRESOLINE) tablet 10 mg  10 mg Per Tube Q6H PRN [place], Yujing Z, RPH       loratadine (CLARITIN) tablet 10 mg  10 mg Per Tube Daily PRN Bary Leriche, PA-C       MEDLINE mouth rinse  15 mL Mouth Rinse BID Bary Leriche, PA-C   15 mL at 01/20/20 2409   megestrol (MEGACE) 400 MG/10ML suspension 400 mg  400 mg Per Tube BID Charlett Blake, MD   400 mg at 01/20/20 0751   methylphenidate (RITALIN) tablet 10 mg  10 mg Oral BID WC Charlett Blake, MD   10 mg at 01/20/20 1155   multivitamin with minerals tablet 1 tablet  1 tablet Per Tube Daily Bary Leriche, PA-C   1 tablet at 01/20/20 0751   Muscle Rub CREA 1 application  1 application Topical BID PRN Charlett Blake, MD   1 application at 73/53/29 9242   polyethylene glycol (MIRALAX / GLYCOLAX) packet 17 g  17 g Per Tube Daily PRN Love, Pamela S, PA-C       prochlorperazine (COMPAZINE) tablet 5-10 mg  5-10 mg Oral Q6H PRN Love, Pamela S, PA-C       Or   prochlorperazine (COMPAZINE) injection 5-10 mg  5-10 mg Intramuscular Q6H PRN Love, Pamela S, PA-C       Or   prochlorperazine (COMPAZINE) suppository 12.5 mg  12.5 mg Rectal Q6H PRN Bary Leriche, PA-C       Resource ThickenUp Clear   Oral PRN Letta Pate Luanna Salk, MD       sennosides (SENOKOT) 8.8 MG/5ML syrup 10 mL  10 mL Per Tube QHS PRN Love, Pamela S, PA-C       sodium phosphate (FLEET) 7-19 GM/118ML enema 1 enema  1 enema Rectal Once PRN Love, Pamela S, PA-C       thiamine tablet 100 mg  100 mg Per Tube Daily Love, Olin Hauser  S, PA-C   100 mg at 01/20/20 0751    traZODone (DESYREL) tablet 25-50 mg  25-50 mg Per Tube QHS PRN Bary Leriche, PA-C   50 mg at 01/16/20 2100     Discharge Medications: Please see discharge summary for a list of discharge medications.  Relevant Imaging Results:  Relevant Lab Results:   Additional Information tub feeding tentative to end 01/22/2020  [name]

## 2020-01-20 NOTE — Progress Notes (Signed)
[place] PHYSICAL MEDICINE & REHABILITATION PROGRESS NOTE   Subjective/Complaints:  Discussed poor appetite, pt indicates that she drank a lot of [facility] PTA, but could not indicate food preference   Review of systems: Limited due to cognition, but appears to deny CP, shortness of breath, nausea, vomiting, diarrhea.  Objective:   No results found. Recent Labs    01/18/20 0745  WBC 7.4  HGB 12.7  HCT 39.7  PLT 320   Recent Labs    01/18/20 0745  NA 138  K 4.6  CL 107  CO2 24  GLUCOSE 98  BUN 28*  CREATININE 0.90  CALCIUM 10.1    Intake/Output Summary (Last 24 hours) at 01/20/2020 0915 Last data filed at 01/19/2020 2003 Gross per 24 hour  Intake 57 ml  Output --  Net 57 ml     Physical Exam: Vital Signs Blood pressure (!) 118/92, pulse 84, temperature 98.2 F (36.8 C), resp. rate 17, SpO2 99 %.  General: No acute distress Mood and affect are appropriate Heart: Regular rate and rhythm no rubs murmurs or extra sounds Lungs: Clear to auscultation, breathing unlabored, no rales or wheezes Abdomen: Positive bowel sounds, soft nontender to palpation, nondistended Extremities: No clubbing, cyanosis, or edema Skin: No evidence of breakdown, no evidence of rash  Motor: RUE/RLE: 5/5 proximal distal LUE/LLE: 2-/5 left biceps and finger flexors, 2- L hip/knee synergy   Assessment/Plan: 1. 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  which require 3+ hours per day of interdisciplinary therapy in a comprehensive inpatient rehab setting.  Physiatrist is providing close team supervision and 24 hour management of active medical problems listed below.  Physiatrist and rehab team continue to assess barriers to discharge/monitor patient progress toward functional and medical goals  Care Tool:  Bathing    Body parts bathed by patient: Face   Body parts bathed by helper: Right arm, Left arm, Right upper leg, Left upper leg, Right lower leg, Left lower leg      Bathing assist Assist Level: Minimal Assistance - Patient > 75%     Upper Body Dressing/Undressing Upper body dressing   What is the patient wearing?: Pull over shirt    Upper body assist Assist Level: Total Assistance - Patient < 25%    Lower Body Dressing/Undressing Lower body dressing      What is the patient wearing?: Incontinence brief, Pants     Lower body assist Assist for lower body dressing: 2 Helpers     Toileting Toileting    Toileting assist Assist for toileting: 2 Helpers     Transfers Chair/bed transfer  Transfers assist     Chair/bed transfer assist level: 2 Helpers     Locomotion Ambulation   Ambulation assist   Ambulation activity did not occur: Safety/medical concerns  Assist level: Total Assistance - Patient < 25% Assistive device:  (wall rail) Max distance: 3 steps   Walk 10 feet activity   Assist  Walk 10 feet activity did not occur: Safety/medical concerns        Walk 50 feet activity   Assist Walk 50 feet with 2 turns activity did not occur: Safety/medical concerns         Walk 150 feet activity   Assist Walk 150 feet activity did not occur: Safety/medical concerns         Walk 10 feet on uneven surface  activity   Assist Walk 10 feet on uneven surfaces activity did not occur: Safety/medical concerns  Wheelchair     Assist Will patient use wheelchair at discharge?: Yes Type of Wheelchair: Manual    Wheelchair assist level: Dependent - Patient 0% Max wheelchair distance: 150    Wheelchair 50 feet with 2 turns activity    Assist        Assist Level: Dependent - Patient 0%   Wheelchair 150 feet activity     Assist      Assist Level: Dependent - Patient 0%   Blood pressure (!) 118/92, pulse 84, temperature 98.2 F (36.8 C), resp. rate 17, SpO2 99 %.  Medical Problem List and Plan: 1. Left hemiplegia with right gaze preference, has difficulty tracking beyond midline  to the left and continues to have limited verbal output secondary to large right MCA infarct.   Continue CIR, team conf in am , some improved movement Left side   PRAFO/WHO qhs 2. Antithrombotics: -DVT/anticoagulation: Pharmaceutical: Lovenox -antiplatelet therapy: On ASA/Plavix 3. Pain Management: Tylenol prn. Sports creme for Right muscular thigh pain exercise related   Appears to be improving on 7/7 4. Mood: LCSW to follow for evaluation and support.  -antipsychotic agents: N/A 5. Neuropsych: This patient is not fully capable of making decisions on his own behalf. 6. Skin/Wound Care: Routine pressure relief measures 7. Fluids/Electrolytes/Nutrition: Monitor I/Os. CMP ordered.DC IV, Cont TF 668ml at noc  8. HTN: Monitor BP tid--Imdur, lisinopril, cardizem and amlodipine has been on hold.  Cardizem 30 mg qid  Vitals:   01/19/20 2001 01/20/20 0431  BP: (!) 149/74 (!) 118/92  Pulse: 83 84  Resp: 18 17  Temp: 98.5 F (36.9 C) 98.2 F (36.8 C)  SpO2: 100% 99%   Relatively controlled on 7/7, some lability  9. UTI: Resolved  10. COPD: Continue Pulmicort nebs bid. 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 To contact Dr. Katherina Right closer to discharge.  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 Change tube feeds to nights. Added water flushes.   Advance diet as tolerated. Wet voice, afebrile , normal WBCs 7/5, no resp distress, paryngeal secreations, enc cough  13.  Sleep disturbance?: Will monitor sleep wake cycle. Was on ambien and trazodone PTA? Ambien prn for now.   Appears to be improving on 7/7 but remains lethargic during the day   14. Azotemia due to dysphagia/elevated BUN            Improving on 7/5 15. Hyperglycemia             Due to TF, improved  16. Constipation Resolved, hold on further PRN laxative  17.  Poor initiation methylphenidate increased to 10mg  monitor effect   LOS: 12 days A FACE TO FACE EVALUATION WAS  PERFORMED  Charlett Blake 01/20/2020, 9:15 AM

## 2020-01-20 NOTE — Progress Notes (Signed)
Patient ID: Anne [name], female   DOB: 1960/09/07, 59 y.o.   MRN: [id number]  Team Conference Report to Patient/Family  Team Conference discussion was reviewed with the patient and caregiver, including goals, any changes in plan of care and target discharge date.  Patient and caregiver express understanding and are in agreement.  The patient has a target discharge date of 02/05/20 (home vs SNF). Discharging to SNF  [name] 01/20/2020, 1:49 PM

## 2020-01-20 NOTE — Progress Notes (Signed)
Physical Therapy Session Note  Patient Details  Name: Anne [name] MRN: [id number] Date of Birth: [birth year]  Today's Date: 01/20/2020 PT Individual Time: 1445-1553 PT Individual Time Calculation (min): 68 min   Short Term Goals: Week 2:  PT Short Term Goal 1 (Week 2): Pt will transfer to and from med with max assist of 1 consistently PT Short Term Goal 2 (Week 2): Family will begin hands on education PT Short Term Goal 3 (Week 2): Pt will perform WC mobility with hemi technique with mod assist x 50 ft PT Short Term Goal 4 (Week 2): Pt will perform gait training with HW x 58f and max assist +2.  Skilled Therapeutic Interventions/Progress Updates:   Pt received sitting in WC and agreeable to PT. Pt transported to hall outside rehab gym. Sit<>stand at rail with mod assist. Pt performed gait training with RUE on rail 4x 159fand max assist to advance the LLE, as well as +2 for WC. Increased L lateral lean and pushers syndrome with fatigue. Pt noted to be able to advance the LLE 25% of the time with tactile cues for stimulation and use of visual feed back from mirror to improve posture.   PT adjusted length of foot rests in WC to improve LE positioning and improve pressure relief. Sit<>stand in parallel bars x 3 with mod-max assist from PT to improve initiation and force improved R weight shift by improving weight bearing through the LLE.weight shifting R and L x 10 with L knee blocked to improve terminal knee extension.   Pt noted to have been incontinent, [facility] toilet with max assist and max cues for safety, sequencing, midline and gait pattern. Pt able to void bowels and bladder on toilet. pericare and clothing management performed by PT with total A while providing mod-max assist to maintain balance at rail. Stand [facility] +2 for safety and WC management.  [facility] again. Performed stand  pivot [facility]2 as listed above, but unable to void.   Patient returned to room and left [facility] bell in reach and all needs met.           Therapy Documentation Precautions:  Precautions Precautions: Other (comment) Precaution Comments: L hemi Restrictions Weight Bearing Restrictions: No    Vital Signs: Therapy Vitals Temp: 97.7 F (36.5 C) Pulse Rate: 81 Resp: 16 BP: (!) 143/76 Patient Position (if appropriate): Sitting Oxygen Therapy SpO2: 100 % O2 Device: Room Air Pain:   Faces: hurts a little. Pt repositioned.     Therapy/Group: Individual Therapy  AuLorie Phenix/01/2020, 4:06 PM

## 2020-01-21 ENCOUNTER — Inpatient Hospital Stay (HOSPITAL_COMMUNITY): Payer: Medicaid Other

## 2020-01-21 ENCOUNTER — Inpatient Hospital Stay (HOSPITAL_COMMUNITY): Payer: Medicaid Other | Admitting: Physical Therapy

## 2020-01-21 ENCOUNTER — Inpatient Hospital Stay (HOSPITAL_COMMUNITY): Payer: Medicaid Other | Admitting: Occupational Therapy

## 2020-01-21 NOTE — Progress Notes (Addendum)
Patient ID: Anne [name], female   DOB: [birth year], 59 y.o.   MRN: [id number]  Sw informed that [facility] is permanently closed

## 2020-01-21 NOTE — Progress Notes (Signed)
Water protocol done 10sips . Coughing at the end noted.

## 2020-01-21 NOTE — Progress Notes (Signed)
Patient ID: Anne [name], female   DOB: 08-25-1960, 59 y.o.   MRN: [id number]  [facility] decline due to insurance plan

## 2020-01-21 NOTE — Progress Notes (Signed)
Physical Therapy Session Note  Patient Details  Name: Anne [name] MRN: [id number] Date of Birth: 06-13-1961  Today's Date: 01/21/2020 PT Individual Time: 1332-1430 AND 1600-1640 PT Individual Time Calculation (min): 58 min and 40 min  Short Term Goals: Week 2:  PT Short Term Goal 1 (Week 2): Pt will transfer to and from med with max assist of 1 consistently PT Short Term Goal 2 (Week 2): Family will begin hands on education PT Short Term Goal 3 (Week 2): Pt will perform WC mobility with hemi technique with mod assist x 50 ft PT Short Term Goal 4 (Week 2): Pt will perform gait training with HW x 55f and max assist +2.  Skilled Therapeutic Interventions/Progress Updates:   Pt received sitting in WC and agreeable to PT. Pt transported to day room. Sit<>stand at Lite gait x 2 with mod assist and max cues for initiation x 2 to don harness. Once In standing PT required to block the LLE into extension and max encouragement to maintain trunk and LE extension due to fatigue and poor sustained attention to task. BWSTT 45 sec + 270m30sec, and 39m60m5sec. Max assist +2 to facilitate adequate step height and length on the L, as well as terminal knee extension in stance on the L. Assist also required for step length on the R and improved lateral weight shift L to improve step through on the R.  Pt returned to room and performed stand pivot transfer to bed with max assist. Sit>supine completed with max assist, and left supine in bed with call bell in reach and all needs met.    Session 2   Bed mobility with min assist on the L. Stand pivot transfer to WC. Toilet transfer for BM. PT performed clothing management and pericare while pt standing at rail. Mod assist and LLE blocked in extension to maintain balance. Stand pivot back to WC [facility] to advance the LLE and block in stance as well as use RUE on rail for support. Increased time for initiation of movement throughout. Patient returned to room and  [facility] reach and all needs met.           Therapy Documentation Precautions:  Precautions Precautions: Other (comment) Precaution Comments: L hemi Restrictions Weight Bearing Restrictions: No    Vital Signs: Therapy Vitals Temp: 97.7 F (36.5 C) Pulse Rate: 90 Resp: 18 BP: 135/73 Patient Position (if appropriate): Lying Oxygen Therapy SpO2: 96 % O2 Device: Room Air Pain: Pain Assessment Pain Score: 0-No pain    Therapy/Group: Individual Therapy  AusLorie Phenix8/2021, 3:03 PM

## 2020-01-21 NOTE — Progress Notes (Signed)
Speech Language Pathology Daily Session Note  Patient Details  Name: Anne [name] MRN: [id number] Date of Birth: 02/13/1961  Today's Date: 01/21/2020 SLP Individual Time: 1105-1210 SLP Individual Time Calculation (min): 65 min  Short Term Goals: Week 2: SLP Short Term Goal 1 (Week 2): Pt will consume dys 1 textures and nectar thick liquids with min assist for use of swallowing precautions. SLP Short Term Goal 2 (Week 2): Pt will consume therapeutic trials of thin liquids without respiratory decompensation over 1 week prior to repeat instrumental assessment of swallowing function. SLP Short Term Goal 3 (Week 2): Pt will sustain her attention to basic functional tasks for 3-5 minute intervals with mod assist verbal cues for redirection. SLP Short Term Goal 4 (Week 2): Pt will complete basic, familiar tasks with mod assist multimodal cues for functional problem solving. SLP Short Term Goal 5 (Week 2): Pt will locate items to the left of midline during basic, famliar tasks with mod assist multimodal cues. SLP Short Term Goal 6 (Week 2): Pt will utilize increased vocal intensity and slow rate to achieve intelligibility at the word level with mod assist multimodal cues.  Skilled Therapeutic Interventions: Skilled ST services focused on swallow and cognitive skills. Pt requested to use bedpan. SLP and nurse assisted in transfer from tilt chair to bed via stedy, pt required max physical assistance, leaning on left side and min A verbal cues following 1 step directions during all tranfers. Pt was unable to void in bedpan, however notified SLP of urge to urinate, but ultmately voided in clean brief. SLP and nurse assistance with changing brief and transferring back into tilt chair. SLP facilitated sustained attention and basic problem solving skills utilizing picture card sequencing task, pt was unable to sequence 3 picture cards, but could sequence 2 picture cards with mod A verbal cues for 70% accuracy. SLP  facilitated oral care prior to thin liquid via cup trials. Pt consumed 2oz of thin liquids with mild anterior spillage, swallow appeared timely and no overt s/s aspiration. Pt requested to call daughter Anne [name], [place] dialed number and informed pt and daughter of planned MBS for tomorrow. All questions were answered to satisfaction, pt was left talking on the phone to daughter.Pt was left in room with call bell within reach and chair alarm set. ST recommends to continue skilled ST services.      Pain Pain Assessment Pain Score: 0-No pain  Therapy/Group: Individual Therapy  Anne [name]  [facility] 01/21/2020, 12:18 PM

## 2020-01-21 NOTE — Progress Notes (Signed)
[place] PHYSICAL MEDICINE & REHABILITATION PROGRESS NOTE   Subjective/Complaints:  Per SLP cortrak is distracting pt from eating - ate >50% breakfast   Review of systems: Limited due to cognition, but appears to deny CP, shortness of breath, nausea, vomiting, diarrhea.  Objective:   No results found. No results for input(s): WBC, HGB, HCT, PLT in the last 72 hours. No results for input(s): NA, K, CL, CO2, GLUCOSE, BUN, CREATININE, CALCIUM in the last 72 hours.  Intake/Output Summary (Last 24 hours) at 01/21/2020 0827 Last data filed at 01/20/2020 1700 Gross per 24 hour  Intake 240 ml  Output --  Net 240 ml     Physical Exam: Vital Signs Blood pressure (!) 150/74, pulse 83, temperature 98 F (36.7 C), resp. rate 16, weight 89.7 kg, SpO2 98 %.  General: No acute distress Mood and affect are appropriate Heart: Regular rate and rhythm no rubs murmurs or extra sounds Lungs: Clear to auscultation, breathing unlabored, no rales or wheezes Abdomen: Positive bowel sounds, soft nontender to palpation, nondistended Extremities: No clubbing, cyanosis, or edema Skin: No evidence of breakdown, no evidence of rash  Motor: RUE/RLE: 5/5 proximal distal LUE/LLE: 2-/5 left biceps and finger flexors, 2- L hip/knee synergy   Assessment/Plan: 1. 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  which require 3+ hours per day of interdisciplinary therapy in a comprehensive inpatient rehab setting.  Physiatrist is providing close team supervision and 24 hour management of active medical problems listed below.  Physiatrist and rehab team continue to assess barriers to discharge/monitor patient progress toward functional and medical goals  Care Tool:  Bathing    Body parts bathed by patient: Face   Body parts bathed by helper: Right arm, Left arm, Right upper leg, Left upper leg, Right lower leg, Left lower leg     Bathing assist Assist Level: Minimal Assistance - Patient > 75%      Upper Body Dressing/Undressing Upper body dressing   What is the patient wearing?: Pull over shirt    Upper body assist Assist Level: Total Assistance - Patient < 25%    Lower Body Dressing/Undressing Lower body dressing      What is the patient wearing?: Incontinence brief, Pants     Lower body assist Assist for lower body dressing: 2 Helpers     Toileting Toileting    Toileting assist Assist for toileting: 2 Helpers     Transfers Chair/bed transfer  Transfers assist     Chair/bed transfer assist level: 2 Helpers     Locomotion Ambulation   Ambulation assist   Ambulation activity did not occur: Safety/medical concerns  Assist level: Total Assistance - Patient < 25% Assistive device:  (wall rail) Max distance: 3 steps   Walk 10 feet activity   Assist  Walk 10 feet activity did not occur: Safety/medical concerns        Walk 50 feet activity   Assist Walk 50 feet with 2 turns activity did not occur: Safety/medical concerns         Walk 150 feet activity   Assist Walk 150 feet activity did not occur: Safety/medical concerns         Walk 10 feet on uneven surface  activity   Assist Walk 10 feet on uneven surfaces activity did not occur: Safety/medical concerns         Wheelchair     Assist Will patient use wheelchair at discharge?: Yes Type of Wheelchair: Manual    Wheelchair assist level: Dependent -  Patient 0% Max wheelchair distance: 150    Wheelchair 50 feet with 2 turns activity    Assist        Assist Level: Dependent - Patient 0%   Wheelchair 150 feet activity     Assist      Assist Level: Dependent - Patient 0%   Blood pressure (!) 150/74, pulse 83, temperature 98 F (36.7 C), resp. rate 16, weight 89.7 kg, SpO2 98 %.  Medical Problem List and Plan: 1. Left hemiplegia with right gaze preference, has difficulty tracking beyond midline to the left and continues to have limited verbal output  secondary to large right MCA infarct.   Continue CIR, team conf in am , some improved movement Left side   PRAFO/WHO qhs 2. Antithrombotics: -DVT/anticoagulation: Pharmaceutical: Lovenox -antiplatelet therapy: On ASA/Plavix 3. Pain Management: Tylenol prn. Sports creme for Right muscular thigh pain exercise related   Appears to be improving on 7/7 4. Mood: LCSW to follow for evaluation and support.  -antipsychotic agents: N/A 5. Neuropsych: This patient is not fully capable of making decisions on his own behalf. 6. Skin/Wound Care: Routine pressure relief measures 7. Fluids/Electrolytes/Nutrition: Monitor I/Os. CMP ordered.DC IV, Cont TF 624ml at noc  8. HTN: Monitor BP tid--Imdur, lisinopril, cardizem and amlodipine has been on hold.  Cardizem 30 mg qid  Vitals:   01/20/20 2152 01/21/20 0509  BP: (!) 159/85 (!) 150/74  Pulse: 78 83  Resp: 18 16  Temp: 98.5 F (36.9 C) 98 F (36.7 C)  SpO2: 100% 98%   Relatively controlled on 7/7, some lability  9. UTI: Resolved  10. COPD: Continue Pulmicort nebs bid. 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 To contact Dr. Katherina Right closer to discharge.  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 Change tube feeds to nights. Added water flushes.   Advance diet as tolerated. Per SLP rec remove cortrak, monitor intake, may need repeat MBS next week  13.  Sleep disturbance?: Will monitor sleep wake cycle. Was on ambien and trazodone PTA? Ambien prn for now.   Appears to be improving on 7/7 but remains lethargic during the day   14. Azotemia due to dysphagia/elevated BUN            Improving on 7/5 15. Hyperglycemia             Due to TF, improved  16. Constipation Resolved, hold on further PRN laxative  17.  Poor initiation methylphenidate increased to 10mg  monitor effect   LOS: 13 days A FACE TO FACE EVALUATION WAS PERFORMED  Charlett Blake 01/21/2020, 8:27 AM

## 2020-01-21 NOTE — Progress Notes (Signed)
Occupational Therapy Session Note  Patient Details  Name: Shareta Fishbaugh MRN: [id number] Date of Birth: [birth year]  Today's Date: 01/21/2020 OT Individual Time: 0903-1000 OT Individual Time Calculation (min): 57 min    Short Term Goals: Week 2:  OT Short Term Goal 1 (Week 2): Pt will transfer onto commode with assist of 1 person. OT Short Term Goal 2 (Week 2): Pt will don UB clothing with mod A overall. OT Short Term Goal 3 (Week 2): Pt will locate 3 self care items to the L with mod cuing.  Skilled Therapeutic Interventions/Progress Updates:    Upon entering the room, pt supine in bed with no c/o pain and agreeable to OT intervention. Pt reports need to use bathroom and requests bed pan. Pt rolling with max A onto bed pan and after 15 minutes unable to void. Total A for peri hygiene and to don LB clothing. Supine >sit with max A to EOB. Pt standing with max A and then pivoting to R with max multimodal cuing for transfer. Pt seated in tilt in space wheelchair for grooming tasks. Pt utilized suction toothbrush for oral hygiene with min A for thoroughness. Pt given 8 oz of thin water,per water protocol, with cuing for small sips and to swallow secondary to pt holding in mouth. No coughing noted. Wheelchair positioned with sink to the L side, pt motivated to wash, pt needing less cuing to turn to the L and reach across midline to obtain wash cloth to wash body. Max A to don pull over shirt. Pt remained in wheelchair at end of session with call bell and all needed items within reach.   Therapy Documentation Precautions:  Precautions Precautions: Other (comment) Precaution Comments: L hemi Restrictions Weight Bearing Restrictions: No   Pain: Pain Assessment Pain Score: 0-No pain   Therapy/Group: Individual Therapy  [name] 01/21/2020, 12:47 PM

## 2020-01-21 NOTE — Plan of Care (Signed)
  Problem: RH BOWEL ELIMINATION Goal: RH STG MANAGE BOWEL WITH ASSISTANCE Description: STG Manage Bowel with Min Assistance. Outcome: Not Progressing; incontinence   Problem: RH BLADDER ELIMINATION Goal: RH STG MANAGE BLADDER WITH ASSISTANCE Description: STG Manage Bladder With Min Assistance Outcome: Not Progressing; incontinence   

## 2020-01-22 ENCOUNTER — Encounter (HOSPITAL_COMMUNITY): Payer: Medicaid Other | Admitting: Speech Pathology

## 2020-01-22 ENCOUNTER — Inpatient Hospital Stay (HOSPITAL_COMMUNITY): Payer: Medicaid Other | Admitting: Occupational Therapy

## 2020-01-22 ENCOUNTER — Inpatient Hospital Stay (HOSPITAL_COMMUNITY): Payer: Medicaid Other

## 2020-01-22 ENCOUNTER — Inpatient Hospital Stay (HOSPITAL_COMMUNITY): Payer: Medicaid Other | Admitting: Physical Therapy

## 2020-01-22 MED ORDER — GUAIFENESIN-DM 100-10 MG/5ML PO SYRP: 5.0000 mL | ORAL_SOLUTION | Freq: Four times a day (QID) | ORAL | Status: DC | PRN

## 2020-01-22 MED ORDER — ATORVASTATIN CALCIUM 40 MG PO TABS
40.0000 mg | ORAL_TABLET | Freq: Every day | ORAL | Status: DC
  Administered 2020-01-23 – 2020-02-16 (×25): 40 mg via ORAL
  Filled 2020-01-22 (×25): qty 1

## 2020-01-22 NOTE — Progress Notes (Signed)
Occupational Therapy Session Note  Patient Details  Name: Anne [name] MRN: [id number] Date of Birth: [birth year]  Today's Date: 01/22/2020 OT Individual Time: 0800-0829 OT Individual Time Calculation (min): 29 min    Short Term Goals: Week 2:  OT Short Term Goal 1 (Week 2): Pt will transfer onto commode with assist of 1 person. OT Short Term Goal 2 (Week 2): Pt will don UB clothing with mod A overall. OT Short Term Goal 3 (Week 2): Pt will locate 3 self care items to the L with mod cuing.  Skilled Therapeutic Interventions/Progress Updates:    Upon entering the room, pt supine in bed with no c/o pain this session. Pt requiring encouragement for participation this session. Pt reports using bed pan this morning prior to therapist arrival and brief is dry. Pt rolling L <> R with total A to don pull over pants with total A. Supine >sit with max A to EOB. Sitting balance with CGA while pt washes face. Pt standing with max A and then needing increased time and max multimodal cuing for stand pivot transfer to the R. Pt seated in tilt is space chair. Pt declined assistance with breakfast and reports not wanting anything to eat or drink. Chair alarm belt donned for safety. Call bell and all needed items within reach upon exiting the room.   Therapy Documentation Precautions:  Precautions Precautions: Other (comment) Precaution Comments: L hemi Restrictions Weight Bearing Restrictions: No   Therapy/Group: Individual Therapy  [name] 01/22/2020, 8:30 AM

## 2020-01-22 NOTE — Progress Notes (Signed)
Patient ID: Anne [name], female   DOB: November 11, 1960, 59 y.o.   MRN: [id number]  Patient declined by [facility] due to no bed availability.

## 2020-01-22 NOTE — Progress Notes (Signed)
Patient ID: Anne [name], female   DOB: 05/27/1961, 59 y.o.   MRN: [id number]   Patient declined by [facility] due to no beds

## 2020-01-22 NOTE — Progress Notes (Signed)
Anne [name] PHYSICAL MEDICINE & REHABILITATION PROGRESS NOTE   Subjective/Complaints:  Patient not hungry this morning but generally has been eating around 50% of meals. Working with OT still requiring max assist x2 for lower body dressing  Review of systems: Limited due to cognition, Objective:   No results found. No results for input(s): WBC, HGB, HCT, PLT in the last 72 hours. No results for input(s): NA, K, CL, CO2, GLUCOSE, BUN, CREATININE, CALCIUM in the last 72 hours.  Intake/Output Summary (Last 24 hours) at 01/22/2020 0949 Last data filed at 01/22/2020 0800 Gross per 24 hour  Intake 1380 ml  Output --  Net 1380 ml     Physical Exam: Vital Signs Blood pressure 138/77, pulse 74, temperature 98 F (36.7 C), resp. rate 16, weight 90 kg, SpO2 99 %.   General: No acute distress Mood and affect are appropriate Heart: Regular rate and rhythm no rubs murmurs or extra sounds Lungs: Clear to auscultation, breathing unlabored, no rales or wheezes Abdomen: Positive bowel sounds, soft nontender to palpation, nondistended Extremities: No clubbing, cyanosis, or edema Skin: No evidence of breakdown, no evidence of rash    Motor: RUE/RLE: 5/5 proximal distal LUE/LLE: 2-/5 left biceps and finger flexors, 2- L hip/knee synergy   Assessment/Plan: 1. 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  which require 3+ hours per day of interdisciplinary therapy in a comprehensive inpatient rehab setting.  Physiatrist is providing close team supervision and 24 hour management of active medical problems listed below.  Physiatrist and rehab team continue to assess barriers to discharge/monitor patient progress toward functional and medical goals  Care Tool:  Bathing    Body parts bathed by patient: Face   Body parts bathed by helper: Right arm, Left arm, Right upper leg, Left upper leg, Right lower leg, Left lower leg     Bathing assist Assist Level: Minimal Assistance - Patient >  75%     Upper Body Dressing/Undressing Upper body dressing   What is the patient wearing?: Pull over shirt    Upper body assist Assist Level: Total Assistance - Patient < 25%    Lower Body Dressing/Undressing Lower body dressing      What is the patient wearing?: Incontinence brief, Pants     Lower body assist Assist for lower body dressing: 2 Helpers     Toileting Toileting    Toileting assist Assist for toileting: 2 Helpers     Transfers Chair/bed transfer  Transfers assist     Chair/bed transfer assist level: Maximal Assistance - Patient 25 - 49%     Locomotion Ambulation   Ambulation assist   Ambulation activity did not occur: Safety/medical concerns  Assist level: Total Assistance - Patient < 25% Assistive device:  (wall rail) Max distance: 3 steps   Walk 10 feet activity   Assist  Walk 10 feet activity did not occur: Safety/medical concerns        Walk 50 feet activity   Assist Walk 50 feet with 2 turns activity did not occur: Safety/medical concerns         Walk 150 feet activity   Assist Walk 150 feet activity did not occur: Safety/medical concerns         Walk 10 feet on uneven surface  activity   Assist Walk 10 feet on uneven surfaces activity did not occur: Safety/medical concerns         Wheelchair     Assist Will patient use wheelchair at discharge?: Yes Type of Wheelchair:  Manual    Wheelchair assist level: Dependent - Patient 0% Max wheelchair distance: 150    Wheelchair 50 feet with 2 turns activity    Assist        Assist Level: Dependent - Patient 0%   Wheelchair 150 feet activity     Assist      Assist Level: Dependent - Patient 0%   Blood pressure 138/77, pulse 74, temperature 98 F (36.7 C), resp. rate 16, weight 90 kg, SpO2 99 %.  Medical Problem List and Plan: 1. Left hemiplegia with right gaze preference, has difficulty tracking beyond midline to the left and continues  to have limited verbal output secondary to large right MCA infarct.   Continue CIR, PT OT speech, some improved movement Left side   PRAFO/WHO qhs 2. Antithrombotics: -DVT/anticoagulation: Pharmaceutical: Lovenox -antiplatelet therapy: On ASA/Plavix 3. Pain Management: Tylenol prn. Sports creme for Right muscular thigh pain exercise related   Appears to be improving on 7/7 4. Mood: LCSW to follow for evaluation and support.  -antipsychotic agents: N/A 5. Neuropsych: This patient is not fully capable of making decisions on his own behalf. 6. Skin/Wound Care: Routine pressure relief measures 7. Fluids/Electrolytes/Nutrition: Monitor I/Os. Improved fluid intake, also Increased, monitor I's and O's caloric intake 8. HTN: Monitor BP tid--Imdur, lisinopril, cardizem and amlodipine has been on hold.  Cardizem 30 mg qid  Vitals:   01/21/20 2004 01/22/20 0310  BP: 139/75 138/77  Pulse: 84 74  Resp: 16 16  Temp: 98 F (36.7 C) 98 F (36.7 C)  SpO2: 97% 99%   Controlled 7/9 9. UTI: Resolved  10. COPD: Continue Pulmicort nebs bid. 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 To contact Dr. Katherina Right closer to discharge.  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 Change tube feeds to nights. Added water flushes.   Advance diet as tolerated. Per SLP rec remove cortrak, monitor intake, may need repeat MBS next week  13.  Sleep disturbance?: Will monitor sleep wake cycle. Was on ambien and trazodone PTA? Ambien prn for now.   Appears to be improving on 7/9 but remains lethargic during the day   14. Azotemia due to dysphagia/elevated BUN            Improving on 7/5 15. Hyperglycemia             Due to TF, improved  16. Constipation Resolved, hold on further PRN laxative  17.  Poor initiation methylphenidate increased to 10mg  monitor effect   LOS: 14 days A FACE TO FACE EVALUATION WAS PERFORMED  Anne [name] 01/22/2020, 9:49 AM

## 2020-01-22 NOTE — Progress Notes (Signed)
Patient ID: Anne [name], female   DOB: [birth year], 60 y.o.   MRN: [id number]   Patient declined by [facility] due to no bed availability.

## 2020-01-22 NOTE — Progress Notes (Signed)
Patient ID: Anne [name], female   DOB: 1960/08/10, 59 y.o.   MRN: [id number]   Patient declined by [facility] due to exceeding their care needs

## 2020-01-22 NOTE — Progress Notes (Signed)
sPhysical Therapy Weekly Progress Note  Patient Details  Name: Anne [name] MRN: [id number] Date of Birth: October 25, 1960  Beginning of progress report period: January 16, 2020 End of progress report period: January 22, 2020  Today's Date: 01/22/2020 PT Individual Time: 7948-0165 PT Individual Time Calculation (min): 73 min   Patient has met 3 of 4 short term goals.  Pt is making steady progress towards LTG of mod assist overall. Pt has demonstrated improved midline orientation in sitting and standing, allowing pt to sit EOB with min assist consistently as well as initiate gait training with rail in hall and [place] and LAFO. Pt able to maintain midline in standing 50% of the time but requires max verbal cues for awareness of lateral lean/pushing when fatigued.   Patient continues to demonstrate the following deficits muscle weakness and muscle joint tightness, decreased cardiorespiratoy endurance, impaired timing and sequencing, abnormal tone, unbalanced muscle activation, motor apraxia and decreased coordination, decreased visual perceptual skills, decreased visual motor skills and field cut, decreased attention to left, left side neglect and decreased motor planning, decreased initiation, decreased attention, decreased awareness, decreased problem solving, decreased safety awareness, decreased memory and delayed processing and decreased sitting balance, decreased standing balance, decreased postural control, hemiplegia and decreased balance strategies and therefore will continue to benefit from skilled PT intervention to increase functional independence with mobility.  Patient progressing toward long term goals..  Continue plan of care.  PT Short Term Goals Week 2:  PT Short Term Goal 1 (Week 2): Pt will transfer to and from med with max assist of 1 consistently PT Short Term Goal 1 - Progress (Week 2): Met PT Short Term Goal 2 (Week 2): Family will begin hands on education PT Short Term Goal 2 - Progress  (Week 2): Progressing toward goal PT Short Term Goal 3 (Week 2): Pt will perform WC mobility with hemi technique with mod assist x 50 ft PT Short Term Goal 3 - Progress (Week 2): Met PT Short Term Goal 4 (Week 2): Pt will perform gait training with HW x 56f and max assist +2. PT Short Term Goal 4 - Progress (Week 2): Met Week 3:  PT Short Term Goal 1 (Week 3): Pt will perform WC mobility x 576fwith min assist PT Short Term Goal 2 (Week 3): [facility] PT Short Term Goal 3 (Week 3): Pt will maintain sitting balance EOB with supervision assist up to 5 min  Skilled Therapeutic Interventions/Progress Updates:   Pt received sitting in WC and agreeable to PT. Pt [facility] when PT entered Room. Upon inspection, PT found alarm belt to be in the off position. Pt stated that she was trying to get to the bathroom. Pt transported to bathroom.  Stand pivot transfer to toilet with UE support on rail and mod assist to side step to toilet. Pt able to void bowels on toilet. Clothing and hygiene performed by PT with mod-max assist to maintain balance.   Pt then transported to rehab gym. Gait training with HW and  x 3052fith max assist + 2 for WC follow. Pt required to advance the  LLE and facilitate R lateral weight shift to allow advancement of the LLE. Max cues for AD management and reciprocal gait pattern.  Attempted to perform additional bout of gait training with HW, but unable to attend to task on second bout.  Pt returned to room and performed stand pivot transfer to bed with max assist and cues for seqeucning, posture, and midline. Sitting balance EOB with min assist x 10 minutes while engaged in fine motor and dual task of phone call with daughter. Increasing L lateral lean with time, but able to correct with cues from PT. Sit>supine completed with max assist, and left supine in bed with call bell  in reach and all needs met.           Therapy Documentation Precautions:  Precautions Precautions: Other (comment) Precaution Comments: L hemi Restrictions Weight Bearing Restrictions: No Vital Signs: Therapy Vitals Pulse Rate: 75 BP: 134/62 Patient Position (if appropriate): Sitting Oxygen Therapy SpO2: 100 % O2 Device: Room Air Pain: Pain Assessment Pain Scale: 0-10 Pain Score: 0-No pain   Therapy/Group: Individual Therapy  [name] 01/22/2020, 3:50 PM

## 2020-01-22 NOTE — Progress Notes (Signed)
Speech Language Pathology Weekly Progress and Session Note  Patient Details  Name: Anne [name] MRN: [id number] Date of Birth: 27-Apr-1961  Beginning of progress report period: January 15, 2020 End of progress report period: January 22, 2020  Short Term Goals: Week 2: SLP Short Term Goal 1 (Week 2): Pt will consume dys 1 textures and nectar thick liquids with min assist for use of swallowing precautions. SLP Short Term Goal 1 - Progress (Week 2): Met SLP Short Term Goal 2 (Week 2): Pt will consume therapeutic trials of thin liquids without respiratory decompensation over 1 week prior to repeat instrumental assessment of swallowing function. SLP Short Term Goal 2 - Progress (Week 2): Progressing toward goal SLP Short Term Goal 3 (Week 2): Pt will sustain her attention to basic functional tasks for 3-5 minute intervals with mod assist verbal cues for redirection. SLP Short Term Goal 3 - Progress (Week 2): Progressing toward goal SLP Short Term Goal 4 (Week 2): Pt will complete basic, familiar tasks with mod assist multimodal cues for functional problem solving. SLP Short Term Goal 4 - Progress (Week 2): Progressing toward goal SLP Short Term Goal 5 (Week 2): Pt will locate items to the left of midline during basic, famliar tasks with mod assist multimodal cues. SLP Short Term Goal 5 - Progress (Week 2): Progressing toward goal SLP Short Term Goal 6 (Week 2): Pt will utilize increased vocal intensity and slow rate to achieve intelligibility at the word level with mod assist multimodal cues. SLP Short Term Goal 6 - Progress (Week 2): Met    New Short Term Goals: Week 3: SLP Short Term Goal 1 (Week 3): Pt will consume current diet with Supervision A verbal cues for use of swallow strategies for oral clearance. SLP Short Term Goal 2 (Week 3): Pt will participate in repeat MBSS to instrumentally assess oropharyngeal swallow function. SLP Short Term Goal 3 (Week 3): Pt will sustain her attention to basic  functional tasks for 3-5 minute intervals with mod assist verbal cues for redirection. SLP Short Term Goal 4 (Week 3): Pt will complete basic, familiar tasks with mod assist multimodal cues for functional problem solving. SLP Short Term Goal 5 (Week 3): Pt will locate items to the left of midline during basic, famliar tasks with mod assist multimodal cues. SLP Short Term Goal 6 (Week 3): Pt will utilize increased vocal intensity and slow rate to achieve intelligibility at the word level with Min assist multimodal cues.  Weekly Progress Updates: Pt has made functional gains and met 2 out of 6 short term goals. Pt is currently Mod-Max assist for due to basic cognitive tasks due to impairments in problem solving, awareness, sustained attention, and left inattention. She also requires Min-Mod cues for use of strategies to increase intelligibility due to dysarthria. Pt has oropharyngeal dysphagia and is consuming dysphagia 1 (puree) diet with nectar thick liquids and requires Min A for use of swallow strategies. She is making good progress toward diet advancement (solids and liquids) with bedside trials, and repeat MBSS is scheduled for Monday 01/25/20. Pt and family education is ongoing. Pt would continue to benefit from skilled ST while inpatient in order to maximize functional independence and reduce burden of care prior to discharge. Anticipate that pt will need 24/7 supervision at discharge in addition to [place] follow up at next level of care.       Intensity: Minumum of 1-2 x/day, 30 to 90 minutes Frequency: 3 to 5 out of 7 days Duration/Length  of Stay: 02/05/20 Treatment/Interventions: Cognitive remediation/compensation;Cueing hierarchy;Environmental controls;Dysphagia/aspiration precaution training;Internal/external aids;Functional tasks;Patient/family education      Arbutus Leas 01/22/2020, 12:56 PM

## 2020-01-22 NOTE — Progress Notes (Signed)
Patient ID: Anne [name], female   DOB: 1960/12/11, 59 y.o.   MRN: [id number]   Patient declined by [facility] due to insurance

## 2020-01-22 NOTE — Progress Notes (Signed)
Patient ID: Anne [name], female   DOB: 11-05-1960, 59 y.o.   MRN: [id number]   Patient declined by [facility]. Due to no Medicaid beds

## 2020-01-22 NOTE — Progress Notes (Signed)
Occupational Therapy Session Note  Patient Details  Name: Anne [name] MRN: [id number] Date of Birth: August 31, 1960  Today's Date: 01/22/2020 OT Individual Time: 1100-1145 OT Individual Time Calculation (min): 45 min    Short Term Goals: Week 1:  OT Short Term Goal 1 (Week 1): Pt will complete sit<>stand for ADL with max A. OT Short Term Goal 1 - Progress (Week 1): Not met OT Short Term Goal 2 (Week 1): Pt will locate 3 self-care items on L side with mod cues. OT Short Term Goal 2 - Progress (Week 1): Not met OT Short Term Goal 3 (Week 1): Pt will don shirt with mod A using hemi dressing techniques. OT Short Term Goal 3 - Progress (Week 1): Not met OT Short Term Goal 4 (Week 1): Pt will demonstrate static sitting balance for 30+ seconds with CGA. OT Short Term Goal 4 - Progress (Week 1): Met  Skilled Therapeutic Interventions/Progress Updates:    1:1 Pt in bed when arrived. Pt came to EOB (off left side of the bed) with mod A with more than reasonable amt of time and max tactile cues for sequencing/method. Pt sat EOB during dressing tasks with supervision to min A. Focus on visual attention to the left, body awareness, initiation, following commands in a more timely manner. Pt required backwards chaining to initiate pulling off shirt and to don clean shirt. Pt able to don clean shirt with mod A. Crossed left Le over right knee to thread pants with A to maintain Le positioning. Pt required mod A to maintain sustained attention in quiet environment. Pt required max A to initiate standing and mod to max to maintain standing. [name] left glut and quad did activate in standing. Pt did transfer into tilt in space w/c - performing stand pivot transfers (stepping with right Le with facilitation to promote stability in left LE. Engaged in feeding. Pt declined attempts to feed self but would accept bites when being feed dependently.   Left with nursing sitting up in the w/c with music playing.    Therapy  Documentation Precautions:  Precautions Precautions: Other (comment) Precaution Comments: L hemi Restrictions Weight Bearing Restrictions: No General:   Vital Signs: Therapy Vitals Pulse Rate: 75 BP: 134/62 Patient Position (if appropriate): Sitting Oxygen Therapy SpO2: 100 % O2 Device: Room Air Pain: Pain Assessment Pain Scale: 0-10 Pain Score: 0-No pain   Therapy/Group: Individual Therapy  Willeen Cass [facility] 01/22/2020, 3:21 PM

## 2020-01-22 NOTE — Progress Notes (Signed)
Patient ID: Anne [name], female   DOB: [birth year], 59 y.o.   MRN: [id number]  Patient declined by [facility] due to no bed availability.

## 2020-01-22 NOTE — Progress Notes (Signed)
Patient ID: Anne [name], female   DOB: [birth year], 58 y.o.   MRN: [id number]   [facility] possibly will make bed offer if Josem Kaufmann is given through managed medicaid:  "If we can get auth for her managed Medicaid, we can offer a bed. Thanks, Kirke Shaggy, CTN"

## 2020-01-22 NOTE — Progress Notes (Signed)
Speech Language Pathology Daily Session Note  Patient Details  Name: Anne [name] MRN: [id number] Date of Birth: [birth year]  Today's Date: 01/22/2020 SLP Individual Time: 0950-1019 SLP Individual Time Calculation (min): 29 min  Short Term Goals: Week 2: SLP Short Term Goal 1 (Week 2): Pt will consume dys 1 textures and nectar thick liquids with min assist for use of swallowing precautions. SLP Short Term Goal 2 (Week 2): Pt will consume therapeutic trials of thin liquids without respiratory decompensation over 1 week prior to repeat instrumental assessment of swallowing function. SLP Short Term Goal 3 (Week 2): Pt will sustain her attention to basic functional tasks for 3-5 minute intervals with mod assist verbal cues for redirection. SLP Short Term Goal 4 (Week 2): Pt will complete basic, familiar tasks with mod assist multimodal cues for functional problem solving. SLP Short Term Goal 5 (Week 2): Pt will locate items to the left of midline during basic, famliar tasks with mod assist multimodal cues. SLP Short Term Goal 6 (Week 2): Pt will utilize increased vocal intensity and slow rate to achieve intelligibility at the word level with mod assist multimodal cues.  Skilled Therapeutic Interventions: Pt was seen for skilled ST targeting dysphagia and cognition. Her daughter was present during session, discussed plans to reschedule MBSS for Monday (see note below). Pt completed oral care via suction toothbrush with Mod A verbal cues for attention to left side of oral cavity and problem solving suction function. Pt required cues to cease brushing, suspect due to perseveration on motor pattern in combination with internal distractions. Pt required overall Max A verbal cues for redirection throughout session, and to locate therapist on left side of environment. Pt accepted trials of thin H2O (~6 oz) with 1 immediate cough noted. Pt then accepted upgraded trial of Dys 2 (minced/ground) solids, during  which she demonstrated somewhat prolonged but mostly efficient and functional mastication. Trace oral residue noted but easily cleared with Min verbal cues from clinician. Recommend continue current diet for now, and ST will continue to provide opportunities for advancement as appropriate.  Note: although MBSS was scheduled for today (01/22/20 at 0930, pt was unable to make appointment due to urgent nursing/toileting needs and schedule did not allow to reschedule in the same day. Will reschedule MBSS for Monday 01/25/20 at)     Pain Pain Assessment Pain Scale: 0-10 Pain Score: 0-No pain  Therapy/Group: Individual Therapy  Arbutus Leas 01/22/2020, 9:44 AM

## 2020-01-22 NOTE — Progress Notes (Signed)
Patient ID: Anne [name], female   DOB: 12/14/1960, 59 y.o.   MRN: [id number]  Patient declined by [facility]

## 2020-01-22 NOTE — Progress Notes (Signed)
Nutrition Follow-up  DOCUMENTATION CODES:   Obesity unspecified  INTERVENTION:   - Magic CupTID with meals, each supplement provides 290 kcal and 9 grams of protein  - d/c Vital Cuisine Shakes  - Continue Pro-stat 30 ml po BID, each supplement provides 100 kcal and 15 grams of protein  - Continue MVI with minerals daily  NUTRITION DIAGNOSIS:   Inadequate oral intake related to dysphagia as evidenced by meal completion < 50%.  Progressing  GOAL:   Patient will meet greater than or equal to 90% of their needs  Progressing  MONITOR:   PO intake, Supplement acceptance, Diet advancement, Labs, TF tolerance  REASON FOR ASSESSMENT:   Consult Calorie Count  ASSESSMENT:   59 yo female admitted to Rehab from [facility] S/P stroke with functional deficits. PMH includes CAD, coronary vasospasms, asthma, tobacco abuse.  6/17 - s/p thrombectomy 6/18 - Cortrak placed 6/24 - s/p MBS, diet advance to dysphagia 1 with nectar-thick liquids 6/25 - transferred to CIR, transitioned to nocturnal TF 7/07 - Cortrak removed, nocturnal TF d/c  Noted target discharge date of 02/05/20.  SLP recommended removal of Cortrak due to distraction it causes. Cortrak was removed and nocturnal TF were d/c on 7/07.  Reviewed weights. Pt with a 1.4 kg weight loss in 3 weeks. This is a 1.5% weight loss which is not quite significant for timeframe. RD will continue to monitor trends.  Per notes, plan is for repeat MBS on Monday.  Spoke with pt at bedside. Noted ~10% completed breakfast meal tray at bedside. Pt had consumed bites of the items on her plate but had completed 100% of her orange Magic Cup from her breakfast tray. RD will make sure that orange Magic Cups are included with all meal trays. Pt reports that she was not hungry for breakfast but states that she will try to eat something from her lunch tray.  Meal Completion: 0-80% x last 8 documented meals  Medications reviewed and include: Pro-stat  30 ml BID, folic acid, megace, ritalin, MVI with minerals, thiamine  Labs reviewed.  NUTRITION - FOCUSED PHYSICAL EXAM:    Most Recent Value  Orbital Region Mild depletion  Upper Arm Region No depletion  Thoracic and Lumbar Region No depletion  Buccal Region No depletion  Temple Region No depletion  Clavicle Bone Region Mild depletion  Clavicle and Acromion Bone Region Mild depletion  Scapular Bone Region No depletion  Dorsal Hand Mild depletion  Patellar Region Mild depletion  Anterior Thigh Region Moderate depletion  Posterior Calf Region Moderate depletion  Edema (RD Assessment) None  Hair Reviewed  Eyes Reviewed  Mouth Reviewed  Skin Reviewed  Nails Reviewed       Diet Order:   Diet Order            DIET - DYS 1 Room service appropriate? Yes; Fluid consistency: Nectar Thick  Diet effective now                 EDUCATION NEEDS:   No education needs have been identified at this time  Skin:  Skin Assessment: Skin Integrity Issues: Incisions: R groin  Last BM:  01/22/20 medium type 6  Height:   Ht Readings from Last 1 Encounters:  12/31/19 5\' 6"  (1.676 m)    Weight:   Wt Readings from Last 1 Encounters:  01/22/20 90 kg    Ideal Body Weight:  59.1 kg  BMI:  Body mass index is 32.02 kg/m.  Estimated Nutritional Needs:   Kcal:  1800-2000  Protein:  100-120 gm  Fluid:  >/= 1.8 L    Gaynell Face, MS, RD, LDN Inpatient Clinical Dietitian Please see AMiON for contact information.

## 2020-01-23 DIAGNOSIS — J449 Chronic obstructive pulmonary disease, unspecified: Secondary | ICD-10-CM

## 2020-01-23 DIAGNOSIS — I69391 Dysphagia following cerebral infarction: Secondary | ICD-10-CM

## 2020-01-23 DIAGNOSIS — I69354 Hemiplegia and hemiparesis following cerebral infarction affecting left non-dominant side: Principal | ICD-10-CM

## 2020-01-23 NOTE — Progress Notes (Signed)
[place] PHYSICAL MEDICINE & REHABILITATION PROGRESS NOTE   Subjective/Complaints: Patient seen laying in bed this morning.  She states she slept well overnight.  She is still sleepy this morning.  No reported issues overnight.  Review of systems: Limited due to cognition  Objective:   No results found. No results for input(s): WBC, HGB, HCT, PLT in the last 72 hours. No results for input(s): NA, K, CL, CO2, GLUCOSE, BUN, CREATININE, CALCIUM in the last 72 hours.  Intake/Output Summary (Last 24 hours) at 01/23/2020 1455 Last data filed at 01/23/2020 0800 Gross per 24 hour  Intake 572 ml  Output --  Net 572 ml     Physical Exam: Vital Signs Blood pressure (!) 138/94, pulse 80, temperature 98.3 F (36.8 C), temperature source Oral, resp. rate 18, weight 91.4 kg, SpO2 100 %. Constitutional: No distress . Vital signs reviewed.  Obese. HENT: Normocephalic.  Atraumatic. Eyes: EOMI. No discharge. Cardiovascular: No JVD. Respiratory: Normal effort.  No stridor. GI: Non-distended. Skin: Warm and dry.  Intact. Psych: Normal mood.  Normal behavior. Musc: No edema in extremities.  No tenderness in extremities. Neuro: Somnolent LUE/LLE: 2-/5 left biceps and finger flexors, 2- L hip/knee synergy, unchanged  Assessment/Plan: 1. 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  which require 3+ hours per day of interdisciplinary therapy in a comprehensive inpatient rehab setting.  Physiatrist is providing close team supervision and 24 hour management of active medical problems listed below.  Physiatrist and rehab team continue to assess barriers to discharge/monitor patient progress toward functional and medical goals  Care Tool:  Bathing    Body parts bathed by patient: Face   Body parts bathed by helper: Right arm, Left arm, Right upper leg, Left upper leg, Right lower leg, Left lower leg     Bathing assist Assist Level: Minimal Assistance - Patient > 75%     Upper Body  Dressing/Undressing Upper body dressing   What is the patient wearing?: Pull over shirt    Upper body assist Assist Level: Total Assistance - Patient < 25%    Lower Body Dressing/Undressing Lower body dressing      What is the patient wearing?: Incontinence brief, Pants     Lower body assist Assist for lower body dressing: 2 Helpers     Toileting Toileting    Toileting assist Assist for toileting: 2 Helpers     Transfers Chair/bed transfer  Transfers assist     Chair/bed transfer assist level: Maximal Assistance - Patient 25 - 49%     Locomotion Ambulation   Ambulation assist   Ambulation activity did not occur: Safety/medical concerns  Assist level: Total Assistance - Patient < 25% Assistive device:  (wall rail) Max distance: 3 steps   Walk 10 feet activity   Assist  Walk 10 feet activity did not occur: Safety/medical concerns        Walk 50 feet activity   Assist Walk 50 feet with 2 turns activity did not occur: Safety/medical concerns         Walk 150 feet activity   Assist Walk 150 feet activity did not occur: Safety/medical concerns         Walk 10 feet on uneven surface  activity   Assist Walk 10 feet on uneven surfaces activity did not occur: Safety/medical concerns         Wheelchair     Assist Will patient use wheelchair at discharge?: Yes Type of Wheelchair: Manual    Wheelchair assist level: Dependent -  Patient 0% Max wheelchair distance: 150    Wheelchair 50 feet with 2 turns activity    Assist        Assist Level: Dependent - Patient 0%   Wheelchair 150 feet activity     Assist      Assist Level: Dependent - Patient 0%   Blood pressure (!) 138/94, pulse 80, temperature 98.3 F (36.8 C), temperature source Oral, resp. rate 18, weight 91.4 kg, SpO2 100 %.  Medical Problem List and Plan: 1. Left hemiparesis with right gaze preference, has difficulty tracking beyond midline to the left  and continues to have limited verbal output secondary to large right MCA infarct.   Continue CIR  PRAFO/WHO qhs 2. Antithrombotics: -DVT/anticoagulation: Pharmaceutical: Lovenox -antiplatelet therapy: On ASA/Plavix 3. Pain Management: Tylenol prn. Sports creme for Right muscular thigh pain exercise related   Appears controlled on 7/10 4. Mood: LCSW to follow for evaluation and support.  -antipsychotic agents: N/A 5. Neuropsych: This patient is not fully capable of making decisions on his own behalf. 6. Skin/Wound Care: Routine pressure relief measures 7. Fluids/Electrolytes/Nutrition: Monitor I/Os. Monitor I's and O's  8. HTN: Monitor BP tid--Imdur, lisinopril, cardizem and amlodipine has been on hold.  Cardizem 30 mg qid  Vitals:   01/22/20 2357 01/23/20 0610  BP: 135/85 (!) 138/94  Pulse:  80  Resp:  18  Temp:  98.3 F (36.8 C)  SpO2:  100%   Relatively controlled on 7/10 9. UTI: Resolved  10. COPD: Continue Pulmicort nebs bid. 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 To contact Dr. Katherina Right closer to discharge.  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 13.  Sleep disturbance?: Will monitor sleep wake cycle. Was on ambien and trazodone PTA? Ambien prn for now.   Appears to be improving  14. Azotemia due to dysphagia/elevated BUN            Improved 15. Hyperglycemia?  Resolved 16. Constipation: Resolved, hold on further PRN laxative 17.  Poor initiation methylphenidate increased to 10mg  monitor effect   LOS: 15 days A FACE TO FACE EVALUATION WAS PERFORMED  Anne [name] 01/23/2020, 2:55 PM

## 2020-01-24 ENCOUNTER — Inpatient Hospital Stay (HOSPITAL_COMMUNITY): Payer: Medicaid Other | Admitting: Occupational Therapy

## 2020-01-24 ENCOUNTER — Inpatient Hospital Stay (HOSPITAL_COMMUNITY): Payer: Medicaid Other | Admitting: Speech Pathology

## 2020-01-24 NOTE — Progress Notes (Signed)
Occupational Therapy Session Note  Patient Details  Name: Anne [name] MRN: [id number] Date of Birth: Feb 22, 1961  Today's Date: 01/24/2020 OT Individual Time: 0902-1000 OT Individual Time Calculation (min): 58 min    Short Term Goals: Week 2:  OT Short Term Goal 1 (Week 2): Pt will transfer onto commode with assist of 1 person. OT Short Term Goal 2 (Week 2): Pt will don UB clothing with mod A overall. OT Short Term Goal 3 (Week 2): Pt will locate 3 self care items to the L with mod cuing.  Skilled Therapeutic Interventions/Progress Updates:    Upon entering the room, pt supine in bed and requesting to put on pants and get into wheelchair. Pt with no c/o pain this session. Pt rolling L <> R with max A and use of bed rails. Pt needing total A to don LB clothing from bed level. Supine >sit with max A to EOB. Pt verbalizing , " dizzy" and BP of 131/82 EOB. Pt reports after several minutes she felt much better. Pt standing with max A and performing stand pivot transfer with L knee blocked. Pt seated in wheelchair at sink and brushes teeth with set up A and washes face with all items placed on L side of sink with min cuing to locate. Pt is very motivated by self care tasks. Pt applying lotion to UEs with hand over hand assistance to apply to R UE. OT assisted pt with fixing her hair at sink and pt attempting to comb herself. Pt remains in chair at end of session with chair alarm belt donned and call bell within reach.   Therapy Documentation Precautions:  Precautions Precautions: Other (comment) Precaution Comments: L hemi Restrictions Weight Bearing Restrictions: No Pain: Pain Assessment Pain Scale: 0-10 Pain Score: 0-No pain Faces Pain Scale: No hurt Other Treatments:     Therapy/Group: Individual Therapy  [name] 01/24/2020, 10:51 AM

## 2020-01-24 NOTE — Progress Notes (Signed)
[place] PHYSICAL MEDICINE & REHABILITATION PROGRESS NOTE   Subjective/Complaints: Patient seen laying in bed this morning.  She states she slept well overnight.  No reported issues overnight.  She denies complaints.  She is still sleepy this morning.  Review of systems: Limited due to cognition  Objective:   No results found. No results for input(s): WBC, HGB, HCT, PLT in the last 72 hours. No results for input(s): NA, K, CL, CO2, GLUCOSE, BUN, CREATININE, CALCIUM in the last 72 hours. No intake or output data in the 24 hours ending 01/24/20 1314   Physical Exam: Vital Signs Blood pressure 139/82, pulse 79, temperature 98.5 F (36.9 C), temperature source Oral, resp. rate 18, weight 89.9 kg, SpO2 92 %. Constitutional: No distress . Vital signs reviewed.  Obese. HENT: Normocephalic.  Atraumatic. Eyes: EOMI. No discharge. Cardiovascular: No JVD. Respiratory: Normal effort.  No stridor. GI: Non-distended. Skin: Warm and dry.  Intact. Psych: Normal mood.  Normal behavior. Musc: No edema in extremities.  No tenderness in extremities. Neuro: Somnolent LUE/LLE: 2-/5 left biceps and finger flexors, 2- L hip/knee synergy, appears stable  Assessment/Plan: 1. 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  which require 3+ hours per day of interdisciplinary therapy in a comprehensive inpatient rehab setting.  Physiatrist is providing close team supervision and 24 hour management of active medical problems listed below.  Physiatrist and rehab team continue to assess barriers to discharge/monitor patient progress toward functional and medical goals  Care Tool:  Bathing    Body parts bathed by patient: Face   Body parts bathed by helper: Right arm, Left arm, Right upper leg, Left upper leg, Right lower leg, Left lower leg     Bathing assist Assist Level: Minimal Assistance - Patient > 75%     Upper Body Dressing/Undressing Upper body dressing   What is the patient  wearing?: Pull over shirt    Upper body assist Assist Level: Total Assistance - Patient < 25%    Lower Body Dressing/Undressing Lower body dressing      What is the patient wearing?: Incontinence brief, Pants     Lower body assist Assist for lower body dressing: Total Assistance - Patient < 25%     Toileting Toileting    Toileting assist Assist for toileting: 2 Helpers     Transfers Chair/bed transfer  Transfers assist     Chair/bed transfer assist level: Maximal Assistance - Patient 25 - 49%     Locomotion Ambulation   Ambulation assist   Ambulation activity did not occur: Safety/medical concerns  Assist level: Total Assistance - Patient < 25% Assistive device:  (wall rail) Max distance: 3 steps   Walk 10 feet activity   Assist  Walk 10 feet activity did not occur: Safety/medical concerns        Walk 50 feet activity   Assist Walk 50 feet with 2 turns activity did not occur: Safety/medical concerns         Walk 150 feet activity   Assist Walk 150 feet activity did not occur: Safety/medical concerns         Walk 10 feet on uneven surface  activity   Assist Walk 10 feet on uneven surfaces activity did not occur: Safety/medical concerns         Wheelchair     Assist Will patient use wheelchair at discharge?: Yes Type of Wheelchair: Manual    Wheelchair assist level: Dependent - Patient 0% Max wheelchair distance: 150    Wheelchair 50  feet with 2 turns activity    Assist        Assist Level: Dependent - Patient 0%   Wheelchair 150 feet activity     Assist      Assist Level: Dependent - Patient 0%   Blood pressure 139/82, pulse 79, temperature 98.5 F (36.9 C), temperature source Oral, resp. rate 18, weight 89.9 kg, SpO2 92 %.  Medical Problem List and Plan: 1. Left hemiparesis with right gaze preference, has difficulty tracking beyond midline to the left and continues to have limited verbal output  secondary to large right MCA infarct.   Continue CIR  PRAFO/WHO qhs 2. Antithrombotics: -DVT/anticoagulation: Pharmaceutical: Lovenox -antiplatelet therapy: On ASA/Plavix 3. Pain Management: Tylenol prn. Sports creme for Right muscular thigh pain exercise related   Appears controlled on 7/11 4. Mood: LCSW to follow for evaluation and support.  -antipsychotic agents: N/A 5. Neuropsych: This patient is not fully capable of making decisions on his own behalf. 6. Skin/Wound Care: Routine pressure relief measures 7. Fluids/Electrolytes/Nutrition: Monitor I/Os. Monitor I's and O's  8. HTN: Monitor BP tid--Imdur, lisinopril, cardizem and amlodipine has been on hold.  Cardizem 30 mg qid  Vitals:   01/24/20 0047 01/24/20 0408  BP: (!) 150/75 139/82  Pulse:  79  Resp:  18  Temp:  98.5 F (36.9 C)  SpO2:  92%   Controlled for the most part on 7/11 9. UTI: Resolved  10. COPD: Continue Pulmicort nebs bid. 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 To contact Dr. Katherina Right closer to discharge.  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 13.  Sleep disturbance?: Will monitor sleep wake cycle. Was on ambien and trazodone PTA? Ambien prn for now.   Improving  14. Azotemia due to dysphagia/elevated BUN            Improved, labs ordered for tomorrow 15. Hyperglycemia?  Resolved 16. Constipation: Resolved, hold on further PRN laxative 17.  Poor initiation methylphenidate increased to 10mg  monitor effect   LOS: 16 days A FACE TO FACE EVALUATION WAS PERFORMED  Donielle Kaigler [name] 01/24/2020, 1:14 PM

## 2020-01-24 NOTE — Progress Notes (Signed)
Speech Language Pathology Daily Session Note  Patient Details  Name: Anne [name] MRN: [id number] Date of Birth: 1961/07/02  Today's Date: 01/24/2020 SLP Individual Time: 1450-1530 SLP Individual Time Calculation (min): 40 min  Short Term Goals: Week 3: SLP Short Term Goal 1 (Week 3): Pt will consume current diet with Supervision A verbal cues for use of swallow strategies for oral clearance. SLP Short Term Goal 2 (Week 3): Pt will participate in repeat MBSS to instrumentally assess oropharyngeal swallow function. SLP Short Term Goal 3 (Week 3): Pt will sustain her attention to basic functional tasks for 3-5 minute intervals with mod assist verbal cues for redirection. SLP Short Term Goal 4 (Week 3): Pt will complete basic, familiar tasks with mod assist multimodal cues for functional problem solving. SLP Short Term Goal 5 (Week 3): Pt will locate items to the left of midline during basic, famliar tasks with mod assist multimodal cues. SLP Short Term Goal 6 (Week 3): Pt will utilize increased vocal intensity and slow rate to achieve intelligibility at the word level with Min assist multimodal cues.  Skilled Therapeutic Interventions:   Pt was seen for skilled ST targeting goals for cognition and dysphagia.  SLP facilitated the session with trials of water following oral care per the water protocol.  Pt demonstrated no overt s/s of aspiration with thin liquids. Recommend that pt proceed with scheduled MBS tomorrow to determine readiness to advance diet.  While consuming trials of thin liquids, therapist also facilitated the session with a basic sorting task to address goals for visual scanning and sustained attention.  Pt needed mod faded to min assist verbal cues to locate cards to the left of midline when sorting cards by color (4 groups) and was able to sustain her attention to task for ~1-2 minute intervals with mod-max verbal cues for redirection.  Pt was left in bed with bed alarm set and  call bell within reach.  Continue per current plan of care.    Pain Pain Assessment Pain Scale: 0-10 Pain Score: 0-No pain  Therapy/Group: Individual Therapy  Joaquin Knebel, Selinda Orion 01/24/2020, 3:47 PM

## 2020-01-25 ENCOUNTER — Inpatient Hospital Stay (HOSPITAL_COMMUNITY): Payer: Medicaid Other | Admitting: *Deleted

## 2020-01-25 ENCOUNTER — Encounter (HOSPITAL_COMMUNITY): Payer: Medicaid Other | Admitting: Speech Pathology

## 2020-01-25 ENCOUNTER — Inpatient Hospital Stay (HOSPITAL_COMMUNITY): Payer: Medicaid Other

## 2020-01-25 ENCOUNTER — Inpatient Hospital Stay (HOSPITAL_COMMUNITY): Payer: Medicaid Other | Admitting: Occupational Therapy

## 2020-01-25 LAB — CBC
HCT: 38.5 % (ref 36.0–46.0)
Hemoglobin: 12.3 g/dL (ref 12.0–15.0)
MCH: 27.8 pg (ref 26.0–34.0)
MCHC: 31.9 g/dL (ref 30.0–36.0)
MCV: 87.1 fL (ref 80.0–100.0)
Platelets: 228 10*3/uL (ref 150–400)
RBC: 4.42 MIL/uL (ref 3.87–5.11)
RDW: 12.5 % (ref 11.5–15.5)
WBC: 7.6 10*3/uL (ref 4.0–10.5)
nRBC: 0 % (ref 0.0–0.2)

## 2020-01-25 LAB — BASIC METABOLIC PANEL
Anion gap: 10 (ref 5–15)
BUN: 20 mg/dL (ref 6–20)
CO2: 20 mmol/L — ABNORMAL LOW (ref 22–32)
Calcium: 10.1 mg/dL (ref 8.9–10.3)
Chloride: 108 mmol/L (ref 98–111)
Creatinine, Ser: 0.82 mg/dL (ref 0.44–1.00)
GFR calc Af Amer: >60 mL/min (ref >60)
GFR calc non Af Amer: >60 mL/min (ref >60)
Glucose, Bld: 93 mg/dL (ref 70–99)
Potassium: 3.9 mmol/L (ref 3.5–5.1)
Sodium: 138 mmol/L (ref 135–145)

## 2020-01-25 NOTE — Progress Notes (Signed)
Occupational Therapy Session Note  Patient Details  Name: Anne [name] MRN: [id number] Date of Birth: 1960/09/09  Today's Date: 01/25/2020 OT Individual Time: 1425-1533 OT Individual Time Calculation (min): 68 min    Short Term Goals: Week 2:  OT Short Term Goal 1 (Week 2): Pt will transfer onto commode with assist of 1 person. OT Short Term Goal 2 (Week 2): Pt will don UB clothing with mod A overall. OT Short Term Goal 3 (Week 2): Pt will locate 3 self care items to the L with mod cuing.  Skilled Therapeutic Interventions/Progress Updates:    Upon entering the room, pt supine in bed with sister and NT present in room. Pt initially declining lunch but agreeable with therapy. Pt feeding self 75% of upgraded meal today! Pt did need min cuing for pocketing of food in L cheek. Supine >sit with max A to EOB. Pt sitting EOB for dynamic balance with UB bathing and dressing with close supervision - CGA for 25 minutes. Pt reaching across midline towards L to obtain wash cloths. Pt attending to washing L side with min - mod cuing this session. OT assisted pt to applying lotion as well as muscle rub with hand over hand assistance. Pt performing sit >supine with mod A for B LEs. OT assisted pt with doffing pants and assisting into R side lying position for comfort and to protect hemiplegic side. Call bell and all needed items within reach upon exiting the room.   Therapy Documentation Precautions:  Precautions Precautions: Other (comment) Precaution Comments: L hemi Restrictions Weight Bearing Restrictions: No Vital Signs: Therapy Vitals Temp: 99 F (37.2 C) Pulse Rate: 70 Resp: 16 BP: 135/84 Patient Position (if appropriate): Sitting Oxygen Therapy SpO2: 99 % O2 Device: Room Air   Therapy/Group: Individual Therapy  [name] 01/25/2020, 4:00 PM

## 2020-01-25 NOTE — Progress Notes (Signed)
Patient ID: Anne [name], female   DOB: 1960/10/27, 59 y.o.   MRN: [id number]   Sw will follow up with [facility] for patient and daughter

## 2020-01-25 NOTE — Progress Notes (Signed)
Speech Language Pathology Daily Session Note  Patient Details  Name: Anne [name] MRN: [id number] Date of Birth: 26-Jul-1960  Today's Date: 01/25/2020 SLP Individual Time: 9774-1423 SLP Individual Time Calculation (min): 25 min  Short Term Goals: Week 3: SLP Short Term Goal 1 (Week 3): Pt will consume current diet with Supervision A verbal cues for use of swallow strategies for oral clearance. SLP Short Term Goal 2 (Week 3): Pt will participate in repeat MBSS to instrumentally assess oropharyngeal swallow function. SLP Short Term Goal 3 (Week 3): Pt will sustain her attention to basic functional tasks for 3-5 minute intervals with mod assist verbal cues for redirection. SLP Short Term Goal 4 (Week 3): Pt will complete basic, familiar tasks with mod assist multimodal cues for functional problem solving. SLP Short Term Goal 5 (Week 3): Pt will locate items to the left of midline during basic, famliar tasks with mod assist multimodal cues. SLP Short Term Goal 6 (Week 3): Pt will utilize increased vocal intensity and slow rate to achieve intelligibility at the word level with Min assist multimodal cues.  Skilled Therapeutic Interventions: Skilled ST services focused on swallow skills. SLP facilitated PO consumption of dsy 1 textures and nectar thick liquids on breakfast tray. Pt consumed minimal amount of dys 1 textures, when questioned pt produced limited verbal response but was able to indicate given yes/no questions, pt does not enjoy the texture of dys 1. SLP provided dsy 2 texture trial in which pt demonstrated appropriate mastication, oral clearance and swallow appeared timely with both dys 1 and dys 2 textures. Pt demonstrated immediate cough x2 when consuming large boluses of nectar thick liquids via cup, with min A verbal cues pt consumed small sips resulting in no further s/s aspiration. SLP educated pt about instrumental swallow study to be preformed later today, pt was unable to demonstrate  immediate recall event given several trials and max A verbal cues. Pt was left in room with call bell within reach and bed alarm set. ST recommends to continue skilled ST services.      Pain Pain Assessment Pain Scale: 0-10 Pain Score: 0-No pain  Therapy/Group: Individual Therapy  Jeriah Corkum  [facility] 01/25/2020, 9:39 AM

## 2020-01-25 NOTE — Progress Notes (Signed)
Modified Barium Swallow Progress Note  Patient Details  Name: Anne [name] MRN: [id number] Date of Birth: [birth year]  Today's Date: 01/25/2020  Modified Barium Swallow completed.  Full report located under Chart Review in the Imaging Section.  Brief recommendations include the following:  Clinical Impression  Pt presents with improvements in oropharyngeal swallow function in comparison to previous MBSS on 01/07/20, and pt's dysphagia is now characterized primarily by oral phase impairments. Although swallow initiation of thin barium occurred at the level of the pyriform sinuses, sometimes with a delay of 3-4 seconds, pt only intermittently displayed flash penetration, with all penetrates staying above the level of the vocal folds and being ejected from the vestibule during the swallow (PAS scale 2). Bolus size and/or use of straw vs cup for bolus delivery did not appear to influence airway protection today. No aspiration observed throughout study. [name] oral phase is characterized by weak lingual manipulation and prolonged mastication of soft solids and decreased bolus cohesion. [name] oral transit and efficiency of mastication was much improved with finely chopped dysphagia 2 solids in comparison to dysphagia 3 soft solids. Pt unable to form cohesive bolus of dual consistency barium pill with thin or barium pill whole in applesauce, therefore, would recommend medications continue to be crushed for administration. However, would also recommend pt upgrade to dysphagia 2 solid textures and thin liquids. Please provide full supervision to ensure use of safe swallow precautions and sustained attention to intake.     Swallow Evaluation Recommendations       SLP Diet Recommendations: Dysphagia 2 (Fine chop) solids;Thin liquid   Liquid Administration via: Cup;Straw   Medication Administration: Crushed with puree   Supervision: Patient able to self feed;Full supervision/cueing for compensatory  strategies   Compensations: Slow rate;Small sips/bites;Minimize environmental distractions   Postural Changes: Seated upright at 90 degrees   Oral Care Recommendations: Oral care BID        Arbutus Leas 01/25/2020,10:16 AM

## 2020-01-25 NOTE — Progress Notes (Signed)
Occupational Therapy Session Note  Patient Details  Name: Anne [name] MRN: [id number] Date of Birth: 1960/09/10  Today's Date: 01/25/2020 OT Individual Time: 1130-1202 OT Individual Time Calculation (min): 32 min    Short Term Goals: Week 2:  OT Short Term Goal 1 (Week 2): Pt will transfer onto commode with assist of 1 person. OT Short Term Goal 2 (Week 2): Pt will don UB clothing with mod A overall. OT Short Term Goal 3 (Week 2): Pt will locate 3 self care items to the L with mod cuing.  Skilled Therapeutic Interventions/Progress Updates:    Upon entering the room, pt supine in bed with no c/o or signs of pain this session. Pt requesting to exit bed and agreeable to attempt toileting. Supine >sit with max A to EOB. Static sitting balance at S - CGA. Stedy utilized to transfer pt onto toilet in bathroom with +2 assistance for safety secondary to pt pushing towards the L side. Pt standing from stedy with min A but needing max A to stand from bed and elevated commode height. Pt sitting for 15  Minutes on toilet in quiet environement but unable to void. Pt standing with max A and total A for hygiene and LB clothing management. Pt returned to transfer into wheelchair with use of stedy in same manner. Chair tilted with chair alarm belt donned and call bell within reach.   Therapy Documentation Precautions:  Precautions Precautions: Other (comment) Precaution Comments: L hemi Restrictions Weight Bearing Restrictions: No    Pain: Pain Assessment Pain Scale: 0-10 Pain Score: 0-No pain   Therapy/Group: Individual Therapy  [name] 01/25/2020, 12:22 PM

## 2020-01-25 NOTE — Progress Notes (Signed)
[place] PHYSICAL MEDICINE & REHABILITATION PROGRESS NOTE   Subjective/Complaints: No complaints today. Left sided neglect much improved! Labs stable today  Review of systems: Limited due to cognition  Objective:   No results found. Recent Labs    01/25/20 0634  WBC 7.6  HGB 12.3  HCT 38.5  PLT 228   Recent Labs    01/25/20 0634  NA 138  K 3.9  CL 108  CO2 20*  GLUCOSE 93  BUN 20  CREATININE 0.82  CALCIUM 10.1   No intake or output data in the 24 hours ending 01/25/20 1011   Physical Exam: Vital Signs Blood pressure 140/68, pulse 78, temperature 98.8 F (37.1 C), resp. rate 18, weight 91 kg, SpO2 97 %. General: Alert and oriented x 3, No apparent distress, Obese. HEENT: Head is normocephalic, atraumatic, PERRLA, EOMI, sclera anicteric, oral mucosa pink and moist, dentition intact, ext ear canals clear,  Neck: Supple without JVD or lymphadenopathy Heart: Reg rate and rhythm. No murmurs rubs or gallops Chest: CTA bilaterally without wheezes, rales, or rhonchi; no distress Abdomen: Soft, non-tender, non-distended, bowel sounds positive. Extremities: No clubbing, cyanosis, or edema. Pulses are 2+ Skin: Warm and dry.  Intact. Psych: Normal mood.  Normal behavior. Musc: No edema in extremities.  No tenderness in extremities. Neuro: Somnolent LUE/LLE: 2-/5 left biceps and finger flexors, 2- L hip/knee synergy, appears stable   Assessment/Plan: 1. 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  which require 3+ hours per day of interdisciplinary therapy in a comprehensive inpatient rehab setting.  Physiatrist is providing close team supervision and 24 hour management of active medical problems listed below.  Physiatrist and rehab team continue to assess barriers to discharge/monitor patient progress toward functional and medical goals  Care Tool:  Bathing    Body parts bathed by patient: Face   Body parts bathed by helper: Right arm, Left arm, Right  upper leg, Left upper leg, Right lower leg, Left lower leg     Bathing assist Assist Level: Minimal Assistance - Patient > 75%     Upper Body Dressing/Undressing Upper body dressing   What is the patient wearing?: Pull over shirt    Upper body assist Assist Level: Total Assistance - Patient < 25%    Lower Body Dressing/Undressing Lower body dressing      What is the patient wearing?: Incontinence brief, Pants     Lower body assist Assist for lower body dressing: Total Assistance - Patient < 25%     Toileting Toileting    Toileting assist Assist for toileting: 2 Helpers     Transfers Chair/bed transfer  Transfers assist     Chair/bed transfer assist level: Maximal Assistance - Patient 25 - 49%     Locomotion Ambulation   Ambulation assist   Ambulation activity did not occur: Safety/medical concerns  Assist level: Total Assistance - Patient < 25% Assistive device:  (wall rail) Max distance: 3 steps   Walk 10 feet activity   Assist  Walk 10 feet activity did not occur: Safety/medical concerns        Walk 50 feet activity   Assist Walk 50 feet with 2 turns activity did not occur: Safety/medical concerns         Walk 150 feet activity   Assist Walk 150 feet activity did not occur: Safety/medical concerns         Walk 10 feet on uneven surface  activity   Assist Walk 10 feet on uneven surfaces activity did  not occur: Safety/medical concerns         Wheelchair     Assist Will patient use wheelchair at discharge?: Yes Type of Wheelchair: Manual    Wheelchair assist level: Dependent - Patient 0% Max wheelchair distance: 150    Wheelchair 50 feet with 2 turns activity    Assist        Assist Level: Dependent - Patient 0%   Wheelchair 150 feet activity     Assist      Assist Level: Dependent - Patient 0%   Blood pressure 140/68, pulse 78, temperature 98.8 F (37.1 C), resp. rate 18, weight 91 kg, SpO2 97  %.  Medical Problem List and Plan: 1. Left hemiparesis with right gaze preference, has difficulty tracking beyond midline to the left and continues to have limited verbal output secondary to large right MCA infarct.   Continue CIR  PRAFO/WHO qhs 2. Antithrombotics: -DVT/anticoagulation: Pharmaceutical: Lovenox -antiplatelet therapy: On ASA/Plavix 3. Pain Management: Tylenol prn. Sports creme for Right muscular thigh pain exercise related   Well controlled 7/12 4. Mood: LCSW to follow for evaluation and support.  -antipsychotic agents: N/A 5. Neuropsych: This patient is not fully capable of making decisions on his own behalf. 6. Skin/Wound Care: Routine pressure relief measures 7. Fluids/Electrolytes/Nutrition: Monitor I/Os. Monitor I's and O's  8. HTN: Monitor BP tid--Imdur, lisinopril, cardizem and amlodipine has been on hold.  Cardizem 30 mg qid  Vitals:   01/25/20 0407 01/25/20 0600  BP: (!) 150/65 140/68  Pulse: 79 78  Resp: 18   Temp: 98.8 F (37.1 C)   SpO2: 97%    7/12: elevated. Flowsheet reviewed. Has been labile. Continue to monitor.  9. UTI: Resolved  10. COPD: Continue Pulmicort nebs bid. Encourage IS  No breathing difficulties.  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 To contact Dr. Katherina Right closer to discharge.  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 13.  Sleep disturbance?: Will monitor sleep wake cycle. Was on ambien and trazodone PTA? Ambien prn for now.   Improving  14. Azotemia due to dysphagia/elevated BUN            Labs stable 7/12 15. Hyperglycemia?  Resolved 16. Constipation: Resolved, hold on further PRN laxative 17.  Poor initiation methylphenidate increased to 10mg  monitor effect   LOS: 17 days A FACE TO FACE EVALUATION WAS PERFORMED  Martha Clan P Timothy Townsel 01/25/2020, 10:11 AM

## 2020-01-25 NOTE — NC FL2 (Signed)
[place] MEDICAID FL2 LEVEL OF CARE SCREENING TOOL     IDENTIFICATION  Patient Name: Anne [name] Birthdate: [birth year] Sex: female Admission Date (Current Location): 01/08/2020  [facility] and [place] Number:  Engineering geologist and Address:  [facility] [place], [place] [address], [place], [place] [address]      Provider Number:    Attending Physician Name and Address:  Charlett Blake, MD  Relative Name and Phone Number:  Jobeth Pangilinan [phone]    Current Level of Care: SNF Recommended Level of Care: [address] Prior Approval Number:    Date Approved/Denied:   PASRR Number: [id number]  Discharge Plan: SNF    Current Diagnoses: Patient Active Problem List   Diagnosis Date Noted  . Hemiparesis affecting left side as late effect of stroke ([place])   . Elevated BUN   . Sleep disturbance   . Left foot pain   . Acute right MCA stroke ([place]) 01/08/2020  . Acute lower UTI   . Left hemiplegia ([place])   . Acute ischemic stroke ([place])   . Dysphagia, post-stroke   . Hyperglycemia   . Chronic obstructive pulmonary disease ([place])   . Acute respiratory failure with hypoxia ([place])   . Stroke (cerebrum) ([place]) 12/31/2019  . CAD (coronary artery disease)   . Polysubstance abuse ([place])   . Hypertension   . Asthma   . Coronary vasospasm ([place])   . NSTEMI (non-ST elevated myocardial infarction) ([place]) 11/25/2014  . Chest pain 11/24/2014    Orientation RESPIRATION BLADDER Height & Weight     Self, Time, Situation  Normal Incontinent Weight: 200 lb 9.9 oz (91 kg) Height:     BEHAVIORAL SYMPTOMS/MOOD NEUROLOGICAL BOWEL NUTRITION STATUS      Incontinent Diet  AMBULATORY STATUS COMMUNICATION OF NEEDS Skin   Extensive Assist Verbally Normal                       Personal Care Assistance Level of Assistance  Bathing, Feeding, Dressing Bathing Assistance: Maximum assistance Feeding assistance: Limited assistance Dressing Assistance:  Maximum assistance Total Care Assistance: Limited assistance   Functional Limitations Info             SPECIAL CARE FACTORS FREQUENCY  PT (By licensed PT), OT (By licensed OT), Speech therapy     PT Frequency: 5x a week OT Frequency: 5x a week     Speech Therapy Frequency: 5x a week      Contractures Contractures Info: Not present    Additional Factors Info                  Current Medications (01/25/2020):  This is the current hospital active medication list Current Facility-Administered Medications  Medication Dose Route Frequency Provider Last Rate Last Admin  . acetaminophen (TYLENOL) tablet 650 mg  650 mg Oral Q6H LoveIvan Anchors, PA-C   650 mg at 01/25/20 1214  . albuterol (PROVENTIL) (2.5 MG/3ML) 0.083% nebulizer solution 2.5 mg  2.5 mg Nebulization Q4H PRN Bary Leriche, PA-C   2.5 mg at 01/13/20 1851  . alum & mag hydroxide-simeth (MAALOX/MYLANTA) 200-200-20 MG/5ML suspension 30 mL  30 mL Oral Q4H PRN Love, Pamela S, PA-C      . aspirin tablet 325 mg  325 mg Oral Daily Bary Leriche, PA-C   325 mg at 01/25/20 0756  . atorvastatin (LIPITOR) tablet 40 mg  40 mg Oral Daily Donnamae Jude, RPH   40 mg at  01/25/20 0756  . bisacodyl (DULCOLAX) suppository 10 mg  10 mg Rectal Daily PRN Bary Leriche, PA-C      . Chlorhexidine Gluconate Cloth 2 % PADS 6 each  6 each Topical Q0600 Bary Leriche, PA-C   6 each at 01/16/20 0445  . clopidogrel (PLAVIX) tablet 75 mg  75 mg Oral Daily Bary Leriche, PA-C   75 mg at 01/25/20 0756  . diltiazem (CARDIZEM) tablet 30 mg  30 mg Oral Q6H Love, Ivan Anchors, PA-C   30 mg at 01/25/20 1214  . diphenhydrAMINE (BENADRYL) 12.5 MG/5ML elixir 12.5-25 mg  12.5-25 mg Oral Q6H PRN Love, Pamela S, PA-C      . enoxaparin (LOVENOX) injection 40 mg  40 mg Subcutaneous Q24H Love, Pamela S, PA-C   40 mg at 01/24/20 2102  . feeding supplement (PRO-STAT SUGAR FREE 64) liquid 30 mL  30 mL Oral BID Bary Leriche, PA-C   30 mL at 01/25/20 0757  . folic  acid (FOLVITE) tablet 1 mg  1 mg Oral Daily Love, Ivan Anchors, PA-C   1 mg at 01/25/20 0756  . guaiFENesin-dextromethorphan (ROBITUSSIN DM) 100-10 MG/5ML syrup 5-10 mL  5-10 mL Oral Q6H PRN Donnamae Jude, RPH      . hydrALAZINE (APRESOLINE) tablet 10 mg  10 mg Oral Q6H PRN Love, Pamela S, PA-C      . loratadine (CLARITIN) tablet 10 mg  10 mg Oral Daily PRN Love, Pamela S, PA-C      . MEDLINE mouth rinse  15 mL Mouth Rinse BID Bary Leriche, PA-C   15 mL at 01/25/20 0757  . megestrol (MEGACE) 400 MG/10ML suspension 400 mg  400 mg Oral BID Bary Leriche, PA-C   400 mg at 01/25/20 0756  . methylphenidate (RITALIN) tablet 10 mg  10 mg Oral BID WC Kirsteins, Luanna Salk, MD   10 mg at 01/25/20 1214  . multivitamin with minerals tablet 1 tablet  1 tablet Oral Daily Bary Leriche, PA-C   1 tablet at 01/25/20 0756  . Muscle Rub CREA 1 application  1 application Topical BID PRN Kirsteins, Luanna Salk, MD   1 application at 45/80/99 1949  . polyethylene glycol (MIRALAX / GLYCOLAX) packet 17 g  17 g Per Tube Daily PRN Love, Pamela S, PA-C      . prochlorperazine (COMPAZINE) tablet 5-10 mg  5-10 mg Oral Q6H PRN Love, Pamela S, PA-C       Or  . prochlorperazine (COMPAZINE) injection 5-10 mg  5-10 mg Intramuscular Q6H PRN Love, Pamela S, PA-C       Or  . prochlorperazine (COMPAZINE) suppository 12.5 mg  12.5 mg Rectal Q6H PRN Love, Ivan Anchors, PA-C      . Resource ThickenUp Clear   Oral PRN Kirsteins, Luanna Salk, MD      . sennosides (SENOKOT) 8.8 MG/5ML syrup 10 mL  10 mL Oral QHS PRN Charlett Blake, MD   10 mL at 01/25/20 0756  . sodium phosphate (FLEET) 7-19 GM/118ML enema 1 enema  1 enema Rectal Once PRN Love, Pamela S, PA-C      . thiamine tablet 100 mg  100 mg Oral Daily Bary Leriche, PA-C   100 mg at 01/25/20 0757  . traZODone (DESYREL) tablet 25-50 mg  25-50 mg Oral QHS PRN Bary Leriche, PA-C   50 mg at 01/24/20 2234     Discharge Medications: Please see discharge summary for a list of discharge  medications.  Relevant Imaging Results:  Relevant Lab Results:   Additional Information tub feeding tentative to end 01/22/2020  [name]

## 2020-01-25 NOTE — Plan of Care (Signed)
  Problem: RH SAFETY Goal: RH STG ADHERE TO SAFETY PRECAUTIONS W/ASSISTANCE/DEVICE Description: STG Adhere to Safety Precautions With Min Assistance/Device.  Outcome: Progressing   Problem: RH COGNITION-NURSING Goal: RH STG USES MEMORY AIDS/STRATEGIES W/ASSIST TO PROBLEM SOLVE Description: STG Uses Memory Aids/Strategies With Min Assistance to Problem Solve.  Outcome: Progressing

## 2020-01-25 NOTE — Progress Notes (Signed)
Physical Therapy Session Note  Patient Details  Name: Anne [name] MRN: [id number] Date of Birth: [birth year]  Today's Date: 01/25/2020 PT Individual Time: 1305-1350 PT Individual Time Calculation (min): 45 min   Short Term Goals: Week 3:  PT Short Term Goal 1 (Week 3): Pt will perform WC mobility x 64f with min assist PT Short Term Goal 2 (Week 3): [facility] Short Term Goal 3 (Week 3): Pt will maintain sitting balance EOB with supervision assist up to 5 min  Skilled Therapeutic Interventions/Progress Updates: Pt presented in TIS agreeable to therapy. Pt states pain in back during activity but unable to rate. Pt transported to hallway and participated in gait training/NMR via forced use of RLE. Pt ambulated 34fx 2 with maxA and w/c follow. PTA placed mirror in front of pt for visual scanning to midline. Pt required total A for advancing LLE however no knee buckling noted. Pt required max cues to look at midline during ambulation. PTA provided facilitation of wt shifting and verbal cues to increase R step length at wall. Pt was able to maintain fair static stand as used R hand to move mask while standing. Once completed pt transported back to room and pt requesting to brush teeth before returning to bed. Pt brushed teeth with modA for placing toothpaste on brush but was able to brush with verbal cues to clean whole mouth only. Also noted that pt maintained looking at mirror while performing task. Performed squat pivot transfer to bed to L max/total A and max A for sit to supine. Pt repositioned to comfort and left with bed alarm on, call bell within reach and needs met.      Therapy Documentation Precautions:  Precautions Precautions: Other (comment) Precaution Comments: L hemi Restrictions Weight Bearing Restrictions: No General:   Vital Signs: Therapy Vitals Temp: 99 F (37.2 C) Pulse Rate: 70 Resp: 16 BP: 135/84 Patient Position (if  appropriate): Sitting Oxygen Therapy SpO2: 99 % O2 Device: Room Air   Therapy/Group: Individual Therapy  Channelle Bottger  Dejohn Ibarra, PTA  01/25/2020, 4:30 PM

## 2020-01-26 ENCOUNTER — Inpatient Hospital Stay (HOSPITAL_COMMUNITY): Payer: Medicaid Other | Admitting: Speech Pathology

## 2020-01-26 ENCOUNTER — Inpatient Hospital Stay (HOSPITAL_COMMUNITY): Payer: Medicaid Other | Admitting: Physical Therapy

## 2020-01-26 ENCOUNTER — Inpatient Hospital Stay (HOSPITAL_COMMUNITY): Payer: Medicaid Other | Admitting: Occupational Therapy

## 2020-01-26 ENCOUNTER — Inpatient Hospital Stay (HOSPITAL_COMMUNITY): Payer: Medicaid Other

## 2020-01-26 MED ORDER — LIDOCAINE 5 % EX PTCH
1.0000 | MEDICATED_PATCH | CUTANEOUS | Status: DC
  Administered 2020-01-26 – 2020-02-16 (×22): 1 via TRANSDERMAL
  Filled 2020-01-26 (×23): qty 1

## 2020-01-26 NOTE — Plan of Care (Signed)
  Problem: Consults Goal: RH STROKE PATIENT EDUCATION Description: See Patient Education module for education specifics  Outcome: Progressing   Problem: RH BOWEL ELIMINATION Goal: RH STG MANAGE BOWEL WITH ASSISTANCE Description: STG Manage Bowel with  [place]. Outcome: Progressing   Problem: RH BLADDER ELIMINATION Goal: RH STG MANAGE BLADDER WITH ASSISTANCE Description: STG Manage Bladder With  Min Assistance  Outcome: Progressing   Problem: RH SKIN INTEGRITY Goal: RH STG SKIN FREE OF INFECTION/BREAKDOWN Description: Free of breakdown with min assist Outcome: Progressing   Problem: RH SAFETY Goal: RH STG ADHERE TO SAFETY PRECAUTIONS W/ASSISTANCE/DEVICE Description: STG Adhere to Safety Precautions With Min Assistance/Device.  Outcome: Progressing   Problem: RH COGNITION-NURSING Goal: RH STG USES MEMORY AIDS/STRATEGIES W/ASSIST TO PROBLEM SOLVE Description: STG Uses Memory Aids/Strategies With Min Assistance to Problem Solve.  Outcome: Progressing   Problem: RH KNOWLEDGE DEFICIT Goal: RH STG INCREASE KNOWLEDGE OF HYPERTENSION Description: Patient/family will be able to describe how to manage hypertension with cues/handouts Outcome: Progressing Goal: RH STG INCREASE KNOWLEDGE OF DYSPHAGIA/FLUID INTAKE Description: Patient/family will be able to describe safe diet and swallowing techniques with cues handouts Outcome: Progressing Goal: RH STG INCREASE KNOWLEDGE OF STROKE PROPHYLAXIS Description: Patient/family will be able to describe how to prevent stroke including diet and medication management with cues/handouts Outcome: Progressing

## 2020-01-26 NOTE — Progress Notes (Signed)
[place] PHYSICAL MEDICINE & REHABILITATION PROGRESS NOTE   Subjective/Complaints: Patient initially voiced no complaint to me. Darleen Crocker OT told me patient complained of left sided shoulder numbness and tingling and right thigh pain during her session with her. Patient does report some left sided neck pain to me when asked.   Review of systems: Limited due to cognition  Objective:   DG Swallowing Func-Speech Pathology  Result Date: 01/25/2020 Objective Swallowing Evaluation: Type of Study: MBS-Modified Barium Swallow Study  Patient Details Name: Anne [name] MRN: [id number] Date of Birth: [birth year] Today's Date: 01/25/2020 Past Medical History: Past Medical History: Diagnosis Date . Asthma  . CAD (coronary artery disease)   a. cardiac cath 02/04/2013: tubular 20% stenosis pRCA, tubular 30% stenosis mRCA, medically managed . Coronary vasospasm (HCC)   a. cardiac cath 11/25/2014: mLCx 30%, pRCA 30%, moderate spasm w/ noted improvement w/ reimaging, recommended CCB, long acting nitrate, smoking cessation, & statin  . Hypertension  . Polysubstance abuse ([place])   a. remote history of crack/cocaine abuse approximately 8-10 years ago, ongoing tobacco abuse since age 18, rare etoh abuse . Tubular adenoma  Past Surgical History: Past Surgical History: Procedure Laterality Date . CARDIAC CATHETERIZATION N/A 11/25/2014  Procedure: Left Heart Cath and Coronary Angiography;  Surgeon: Minna Merritts, MD;  Location: [facility];  Service: Cardiovascular;  Laterality: N/A; . IR CT HEAD LTD  12/31/2019 . IR PERCUTANEOUS ART THROMBECTOMY/INFUSION INTRACRANIAL INC DIAG ANGIO  12/31/2019    . IR PERCUTANEOUS ART THROMBECTOMY/INFUSION INTRACRANIAL INC DIAG ANGIO  12/31/2019 . RADIOLOGY WITH ANESTHESIA Left 12/31/2019  Procedure: RADIOLOGY WITH ANESTHESIA;  Surgeon: Radiologist, Medication, MD;  Location: [facility];  Service: Radiology;  Laterality: Left; HPI: Pt is a 59 yo female presenting with L hemiplegia and R  gaze preference. CT showed R MCA infarct. CTA revealed occlusion of R ICA. Pt is s/p thrombectomy on 6/17. PMH includes polysubstance abuse, HTN, coronary vasospasms, CAD, asthma. Pt admitted to [facility] 01/08/20.  Assessment / Plan / Recommendation CHL IP CLINICAL IMPRESSIONS 01/25/2020 Clinical Impression Pt presents with improvements in oropharyngeal swallow function in comparison to previous MBSS on 01/07/20, and pt's dysphagia is now characterized primarily by oral phase impairments. Although swallow initiation of thin barium occurred at the level of the pyriform sinuses, sometimes with a delay of 3-4 seconds, pt only intermittently displayed flash penetration, with all penetrates staying above the level of the vocal folds and being ejected from the vestibule during the swallow (PAS scale 2). Bolus size and/or use of straw vs cup for bolus delivery did not appear to influence airway protection today. No aspiration observed throughout study. [name] oral phase is characterized by weak lingual manipulation and prolonged mastication of soft solids and decreased bolus cohesion. [name] oral transit and efficiency of mastication was much improved with finely chopped dysphagia 2 solids in comparison to dysphagia 3 soft solids. Pt unable to form cohesive bolus of dual consistency barium pill with thin or barium pill whole in applesauce, therefore, would recommend medications continue to be crushed for administration. However, would also recommend pt upgrade to dysphagia 2 solid textures and thin liquids. Please provide full supervision to ensure use of safe swallow precautions and sustained attention to intake. SLP Visit Diagnosis Dysphagia, oropharyngeal phase (R13.12) Attention and concentration deficit following -- Frontal lobe and executive function deficit following -- Impact on safety and function Mild aspiration risk   CHL IP TREATMENT RECOMMENDATION 01/07/2020 Treatment Recommendations Therapy as outlined in treatment plan  below  Prognosis 01/07/2020 Prognosis for Safe Diet Advancement Good Barriers to Reach Goals Cognitive deficits Barriers/Prognosis Comment -- CHL IP DIET RECOMMENDATION 01/25/2020 SLP Diet Recommendations Dysphagia 2 (Fine chop) solids;Thin liquid Liquid Administration via Cup;Straw Medication Administration Crushed with puree Compensations Slow rate;Small sips/bites;Minimize environmental distractions Postural Changes Seated upright at 90 degrees   CHL IP OTHER RECOMMENDATIONS 01/25/2020 Recommended Consults -- Oral Care Recommendations Oral care BID Other Recommendations --   CHL IP FOLLOW UP RECOMMENDATIONS 01/08/2020 Follow up Recommendations Inpatient Rehab   CHL IP FREQUENCY AND DURATION 01/07/2020 Speech Therapy Frequency (ACUTE ONLY) min 2x/week Treatment Duration 2 weeks      CHL IP ORAL PHASE 01/25/2020 Oral Phase -- Oral - Pudding Teaspoon -- Oral - Pudding Cup -- Oral - Honey Teaspoon -- Oral - Honey Cup -- Oral - Nectar Teaspoon -- Oral - Nectar Cup -- Oral - Nectar Straw -- Oral - Thin Teaspoon -- Oral - Thin Cup -- Oral - Thin Straw -- Oral - Puree Piecemeal swallowing;Decreased bolus cohesion Oral - Mech Soft -- Oral - Regular -- Oral - Multi-Consistency -- Oral - Pill -- Oral Phase - Comment --  CHL IP PHARYNGEAL PHASE 01/25/2020 Pharyngeal Phase Impaired Pharyngeal- Pudding Teaspoon -- Pharyngeal -- Pharyngeal- Pudding Cup -- Pharyngeal -- Pharyngeal- Honey Teaspoon -- Pharyngeal -- Pharyngeal- Honey Cup -- Pharyngeal -- Pharyngeal- Nectar Teaspoon -- Pharyngeal -- Pharyngeal- Nectar Cup NT Pharyngeal -- Pharyngeal- Nectar Straw NT Pharyngeal -- Pharyngeal- Thin Teaspoon -- Pharyngeal -- Pharyngeal- Thin Cup Delayed swallow initiation-pyriform sinuses;Penetration/Aspiration during swallow Pharyngeal Material enters airway, remains ABOVE vocal cords then ejected out Pharyngeal- Thin Straw Delayed swallow initiation-pyriform sinuses Pharyngeal -- Pharyngeal- Puree Delayed swallow initiation-vallecula  Pharyngeal -- Pharyngeal- Mechanical Soft Delayed swallow initiation-vallecula Pharyngeal -- Pharyngeal- Regular NT Pharyngeal -- Pharyngeal- Multi-consistency -- Pharyngeal -- Pharyngeal- Pill (No Data) Pharyngeal -- Pharyngeal Comment --  CHL IP CERVICAL ESOPHAGEAL PHASE 01/25/2020 Cervical Esophageal Phase WFL Pudding Teaspoon -- Pudding Cup -- Honey Teaspoon -- Honey Cup -- Nectar Teaspoon -- Nectar Cup -- Nectar Straw -- Thin Teaspoon -- Thin Cup -- Thin Straw -- Puree -- Mechanical Soft -- Regular -- Multi-consistency -- Pill -- Cervical Esophageal Comment -- Arbutus Leas 01/25/2020, 10:19 AM              Recent Labs    01/25/20 0634  WBC 7.6  HGB 12.3  HCT 38.5  PLT 228   Recent Labs    01/25/20 0634  NA 138  K 3.9  CL 108  CO2 20*  GLUCOSE 93  BUN 20  CREATININE 0.82  CALCIUM 10.1    Intake/Output Summary (Last 24 hours) at 01/26/2020 1244 Last data filed at 01/25/2020 1900 Gross per 24 hour  Intake 240 ml  Output --  Net 240 ml     Physical Exam: Vital Signs Blood pressure (!) 153/75, pulse 67, temperature 98.3 F (36.8 C), temperature source Oral, resp. rate 16, weight 91 kg, SpO2 100 %. General: No apparent distress, obese HEENT: Head is normocephalic, atraumatic, PERRLA, EOMI, sclera anicteric, oral mucosa pink and moist, dentition intact, ext ear canals clear,  Neck: Supple without JVD or lymphadenopathy Heart: Reg rate and rhythm. No murmurs rubs or gallops Chest: CTA bilaterally without wheezes, rales, or rhonchi; no distress Abdomen: Soft, non-tender, non-distended, bowel sounds positive. Extremities: No clubbing, cyanosis, or edema. Pulses are 2+ Skin: Warm and dry.  Intact. Psych: Normal mood.  Normal behavior. Musc: No edema in extremities.  No tenderness in extremities. Neuro: Somnolent LUE/LLE:  2-/5 left biceps and finger flexors, 2- L hip/knee synergy, appears stable  Assessment/Plan: 1. 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  which  require 3+ hours per day of interdisciplinary therapy in a comprehensive inpatient rehab setting.  Physiatrist is providing close team supervision and 24 hour management of active medical problems listed below.  Physiatrist and rehab team continue to assess barriers to discharge/monitor patient progress toward functional and medical goals  Care Tool:  Bathing    Body parts bathed by patient: Left arm, Chest, Abdomen, Front perineal area, Right upper leg, Left upper leg   Body parts bathed by helper: Buttocks, Right arm, Right lower leg, Left lower leg, Face     Bathing assist Assist Level: Moderate Assistance - Patient 50 - 74%     Upper Body Dressing/Undressing Upper body dressing   What is the patient wearing?: Dress    Upper body assist Assist Level: Maximal Assistance - Patient 25 - 49%    Lower Body Dressing/Undressing Lower body dressing      What is the patient wearing?: Incontinence brief, Pants     Lower body assist Assist for lower body dressing: Total Assistance - Patient < 25%     Toileting Toileting    Toileting assist Assist for toileting: 2 Helpers     Transfers Chair/bed transfer  Transfers assist     Chair/bed transfer assist level: Maximal Assistance - Patient 25 - 49%     Locomotion Ambulation   Ambulation assist   Ambulation activity did not occur: Safety/medical concerns  Assist level: Total Assistance - Patient < 25% Assistive device:  (wall rail) Max distance: 3 steps   Walk 10 feet activity   Assist  Walk 10 feet activity did not occur: Safety/medical concerns        Walk 50 feet activity   Assist Walk 50 feet with 2 turns activity did not occur: Safety/medical concerns         Walk 150 feet activity   Assist Walk 150 feet activity did not occur: Safety/medical concerns         Walk 10 feet on uneven surface  activity   Assist Walk 10 feet on uneven surfaces activity did not occur: Safety/medical  concerns         Wheelchair     Assist Will patient use wheelchair at discharge?: Yes Type of Wheelchair: Manual    Wheelchair assist level: Dependent - Patient 0% Max wheelchair distance: 150    Wheelchair 50 feet with 2 turns activity    Assist        Assist Level: Dependent - Patient 0%   Wheelchair 150 feet activity     Assist      Assist Level: Dependent - Patient 0%   Blood pressure (!) 153/75, pulse 67, temperature 98.3 F (36.8 C), temperature source Oral, resp. rate 16, weight 91 kg, SpO2 100 %.  Medical Problem List and Plan: 1. Left hemiparesis with right gaze preference, has difficulty tracking beyond midline to the left and continues to have limited verbal output secondary to large right MCA infarct.   Continue CIR  PRAFO/WHO qhs 2. Antithrombotics: -DVT/anticoagulation: Pharmaceutical: Lovenox -antiplatelet therapy: On ASA/Plavix 3. Pain Management: Tylenol prn. Sports creme for Right muscular thigh pain exercise related   Complained of left shoulder numbness and tingling to [place] OT. Notes left lateral neck pain when asked. Tender to palpation along left lateral masses. Kpad to left neck muscles and Lidocaine patch to left shoulder. Cervical XR  obtained to assess for facet arthritis. Left thigh pain appears muscular- kpad ordered and muscle gel is providing relief 4. Mood: LCSW to follow for evaluation and support.  -antipsychotic agents: N/A 5. Neuropsych: This patient is not fully capable of making decisions on his own behalf. 6. Skin/Wound Care: Routine pressure relief measures 7. Fluids/Electrolytes/Nutrition: Monitor I/Os. Monitor I's and O's  8. HTN: Monitor BP tid--Imdur, lisinopril, cardizem and amlodipine has been on hold.  Cardizem 30 mg qid  Vitals:   01/26/20 0046 01/26/20 0634  BP: 125/74 (!) 153/75  Pulse: 67 67  Resp:    Temp:  98.3 F (36.8 C)  SpO2:  100%   7/13 elevated this morning, but  normal earlier. Continue to monitor 9. UTI: Resolved  10. COPD: Continue Pulmicort nebs bid. 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 To contact Dr. Katherina Right closer to discharge.  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  7/13: Upgrade to D2 thins based on swallow study 13.  Sleep disturbance?: Will monitor sleep wake cycle. Was on ambien and trazodone PTA? Ambien prn for now.   Improving  14. Azotemia due to dysphagia/elevated BUN            Labs stable 7/12 15. Hyperglycemia?  Resolved 16. Constipation: Resolved, hold on further PRN laxative 17.  Poor initiation methylphenidate increased to 10mg  monitor effect   LOS: 18 days A FACE TO FACE EVALUATION WAS PERFORMED  Clide Deutscher Lourie Retz 01/26/2020, 12:44 PM

## 2020-01-26 NOTE — Progress Notes (Signed)
Patient ID: Anne [name], female   DOB: 1961-02-01, 60 y.o.   MRN: [id number]   [facility] has declined due to insurance

## 2020-01-26 NOTE — Progress Notes (Signed)
Patient ID: Anne [name], female   DOB: Mar 02, 1961, 59 y.o.   MRN: [id number]   [facility] reviewing patient referral

## 2020-01-26 NOTE — Progress Notes (Signed)
Occupational Therapy Session Note  Patient Details  Name: Anne [name] MRN: [id number] Date of Birth: 1961/02/25  Today's Date: 01/26/2020 OT Individual Time: 9233-0076 OT Individual Time Calculation (min): 58 min    Short Term Goals: Week 2:  OT Short Term Goal 1 (Week 2): Pt will transfer onto commode with assist of 1 person. OT Short Term Goal 2 (Week 2): Pt will don UB clothing with mod A overall. OT Short Term Goal 3 (Week 2): Pt will locate 3 self care items to the L with mod cuing.  Skilled Therapeutic Interventions/Progress Updates:    Upon entering the room, pt supine in bed and sleeping soundly. Multiple attempts made to wake pt. Pt declined OOB activities secondary to fatigue. OT stretching L UE in all planes of movement x 10 reps. Pt reports "pins and needles" in L shoulder and RN notified for medication. Pt then requesting to eat breakfast and fed self grits, fruit, and OJ with all items placed L to midline. Pt needing increased time and min cuing to locate. Pt requesting to put on pants and reports feeling cold. Pt rolling L <> R with max A and to washing peri area with therapist assisting with buttocks. Total A to don clean brief and pull over pants. Pt repositioned in side lying for comfort and asleep as therapist exited the room.   Therapy Documentation Precautions:  Precautions Precautions: Other (comment) Precaution Comments: L hemi Restrictions Weight Bearing Restrictions: No   Therapy/Group: Individual Therapy  [name] 01/26/2020, 12:12 PM

## 2020-01-26 NOTE — Progress Notes (Addendum)
Speech Language Pathology Daily Session Note  Patient Details  Name: Anne [name] MRN: [id number] Date of Birth: 03-02-1961  Today's Date: 01/26/2020 SLP Individual Time: 2035-5974 SLP Individual Time Calculation (min): 45 min  Short Term Goals: Week 3: SLP Short Term Goal 1 (Week 3): Pt will consume current diet with Supervision A verbal cues for use of swallow strategies for oral clearance. SLP Short Term Goal 2 (Week 3): Pt will participate in repeat MBSS to instrumentally assess oropharyngeal swallow function. SLP Short Term Goal 3 (Week 3): Pt will sustain her attention to basic functional tasks for 3-5 minute intervals with mod assist verbal cues for redirection. SLP Short Term Goal 4 (Week 3): Pt will complete basic, familiar tasks with mod assist multimodal cues for functional problem solving. SLP Short Term Goal 5 (Week 3): Pt will locate items to the left of midline during basic, famliar tasks with mod assist multimodal cues. SLP Short Term Goal 6 (Week 3): Pt will utilize increased vocal intensity and slow rate to achieve intelligibility at the word level with Min assist multimodal cues.  Skilled Therapeutic Interventions: Pt was seen for skilled ST targeting dysphagia and cognitive goals. Session started 15 mins late due to nursing needs. [name] present for portion of session, all questions answered regarding results of MBSS and difference between current diet texture (dysphagia 2) vs regular textures. With Min A verbal cues pt utilized appropriate drinking rate with thin H2O via straw, and no overt s/sx aspiration were observed. Recommend continue current diet.  SLP further facilitated session with various tasks targeting sustained attention, visual scanning, and problem solving goals. Pt created a list of items to search within a grocery ad with Max A for visual scanning, however only able to find 1 out of 5 items before being unable to sustain attention to task. Target  switched to sustained attention to verbal tasks; pt named 11 items within a category with Max A multimodal cues for sustained attention to topic and environmental modifications (removed items from table, turned TV off, etc.). Pt left laying in bed with alarm set and needs within reach. Continue per current plan of care.        Pain Pain Assessment Pain Score: 0-No pain  Therapy/Group: Individual Therapy  Arbutus Leas 01/26/2020, 7:26 AM

## 2020-01-26 NOTE — Progress Notes (Signed)
Physical Therapy Session Note  Patient Details  Name: Anne [name] MRN: [id number] Date of Birth: Oct 03, 1960  Today's Date: 01/26/2020 PT Individual Time: 1000-1100 PT Individual Time Calculation (min): 60 min   Short Term Goals: Week 3:  PT Short Term Goal 1 (Week 3): Pt will perform WC mobility x 86f with min assist PT Short Term Goal 2 (Week 3): [facility] PT Short Term Goal 3 (Week 3): Pt will maintain sitting balance EOB with supervision assist up to 5 min  Skilled Therapeutic Interventions/Progress Updates: Pt presented in bed agreeable to therapy. Pt states back pain but unable to rate. Performed supine to sit with modA and required verbal cues only to achieve midline sitting but unable to maintain neutral gaze. Performed squat pivot transfer to TIS, modA x 2 as pt appeared more internally distracted today. Pt transported to rehab gym and attempted stand pivot to R with hemi walker. Pt required increased time and maxA for STS and maxA x 2 with max multimodal cues for sequencing with max facilitation for wt shifting and PTA blocking L knee to allow pt to advance RLE. While sitting EOM use of mirror to have pt turn to midline. Pt unable to sustain for more than 1-2 sec therefore PTA obtained pictures of grandchildren and placed on mirror. Pt was able to more frequently turn head towards mirror to look at pictures and pt was ultimately able to maintain neutral for approx 10 sec. Attempted additional STS from EOM both using hemi-walker as well as placing chair in front of pt to facilitate push off and anterior lean. Pt however demonstrated minimal initiation and once able to stand demonstrated minimal initiation to perform any self correction despite max cues for LLE and hips. Performed squat pivot transfer back to TIS and pt transported back to room. Pt left in TIS at end of session and left with belt alarm on, call bell within reach and needs met.       Therapy Documentation Precautions:  Precautions Precautions: Other (comment) Precaution Comments: L hemi Restrictions Weight Bearing Restrictions: No General:   Vital Signs: Therapy Vitals Temp: 98.4 F (36.9 C) Pulse Rate: 88 Resp: 17 BP: (!) 137/94 Patient Position (if appropriate): Sitting Oxygen Therapy SpO2: 100 % O2 Device: Room Air Pain: Pain Assessment Pain Scale: Faces Faces Pain Scale: Hurts a little bit Pain Type: Acute pain Pain Location: Shoulder Pain Orientation: Left Pain Onset: Gradual Pain Intervention(s): RN made aware Multiple Pain Sites: No Mobility:   Locomotion :    Trunk/Postural Assessment :    Balance:   Exercises:   Other Treatments:      Therapy/Group: Individual Therapy  Lashanti Chambless 01/26/2020, 4:28 PM

## 2020-01-27 ENCOUNTER — Inpatient Hospital Stay (HOSPITAL_COMMUNITY): Payer: Medicaid Other | Admitting: Physical Therapy

## 2020-01-27 ENCOUNTER — Inpatient Hospital Stay (HOSPITAL_COMMUNITY): Payer: Medicaid Other | Admitting: Speech Pathology

## 2020-01-27 ENCOUNTER — Inpatient Hospital Stay (HOSPITAL_COMMUNITY): Payer: Medicaid Other | Admitting: Occupational Therapy

## 2020-01-27 NOTE — Progress Notes (Signed)
Occupational Therapy Weekly Progress Note  Patient Details  Name: Anne [name] MRN: [id number] Date of Birth: 23-May-1961  Beginning of progress report period: January 19, 2020 End of progress report period: January 27, 2020  Today's Date: 01/27/2020 OT Individual Time: 1345-1445 OT Individual Time Calculation (min): 60 min    Patient has met 2 of 3 short term goals. Pt making slow progress towards occupational therapy goals this week secondary to decreased motivation and cognitive deficits. Pt needing max cuing for redirection, attention to task, initiation, and sequencing of tasks. Pt has been motivated by bathing tasks this week. Pt needing mod - max A for UB self care and total A for LB self care. Functional transfer have been max A for squat or stand pivot transfers. However, this session needed +2 for all functional transfers secondary to lack of motivated and above mentioned deficits.  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  Patient progressing toward long term goals..  Continue plan of care.  OT Short Term Goals Week 2:  OT Short Term Goal 1 (Week 2): Pt will transfer onto commode with assist of 1 person. OT Short Term Goal 1 - Progress (Week 2): Not met OT Short Term Goal 2 (Week 2): Pt will don UB clothing with mod A overall. OT Short Term Goal 2 - Progress (Week 2): Met OT  Short Term Goal 3 (Week 2): Pt will locate 3 self care items to the L with mod cuing. OT Short Term Goal 3 - Progress (Week 2): Met Week 3:  OT Short Term Goal 1 (Week 3): STGs=LTGs secondary to upcoming discharge  Skilled Therapeutic Interventions/Progress Updates:    Upon entering the room, pt supine in bed with no c/o pain this session.Pt agreeable to OT intervention. Plans made for toileting this session with pt verbalizing agreement. Pt coming to EOB with max A for supine >sit. Pt standing with max A and transferring with stand pivot transfer. Pt asking pt to use R foot to hook L foot and hold up for therapist to push her via wheelchair into bathroom. Pt unable to attend to task for longer than a few seconds and needed total A to hold up LEs to enter bathroom via wheelchair. Wheelchair positioned for transfer and R hand placed on grab bar. Pt given count to stand and does not initiate at all on her own. Pt needing +2 to stand from wheelchair and transfer to commode. Pt needing +2 for therapist to stand pt and push LB clothing down. [name] brief is soiled and changed. Pt does not urinate while on toilet after sitting for 10 minutes. Pt verbalized not feeling well while sitting on toilet and standing into stedy with +2 assistance again and returned back to bed. When asked what was wrong the pt reports, " I just don't want to do it". OT attempting to encourage pt but she declines further and remains in bed at end of session and is tearful.   Therapy Documentation Precautions:  Precautions Precautions: Other (comment) Precaution Comments: L hemi Restrictions Weight Bearing Restrictions: No  Pain: Pain Assessment Pain Scale: 0-10 Pain Score: 4  Pain Type: Acute pain Pain Location: Head Pain Orientation: Right Pain Descriptors / Indicators: Headache Pain Onset: Gradual Pain Intervention(s): RN made aware Multiple Pain Sites: No   Therapy/Group: Individual Therapy  [name] 01/27/2020, 12:52 PM

## 2020-01-27 NOTE — Progress Notes (Signed)
Speech Language Pathology Daily Session Note  Patient Details  Name: Anne [name] MRN: [id number] Date of Birth: 1961/06/07  Today's Date: 01/27/2020 SLP Individual Time: 1130-1157 SLP Individual Time Calculation (min): 27 min  Short Term Goals: Week 3: SLP Short Term Goal 1 (Week 3): Pt will consume current diet with Supervision A verbal cues for use of swallow strategies for oral clearance. SLP Short Term Goal 2 (Week 3): Pt will participate in repeat MBSS to instrumentally assess oropharyngeal swallow function. SLP Short Term Goal 3 (Week 3): Pt will sustain her attention to basic functional tasks for 3-5 minute intervals with mod assist verbal cues for redirection. SLP Short Term Goal 4 (Week 3): Pt will complete basic, familiar tasks with mod assist multimodal cues for functional problem solving. SLP Short Term Goal 5 (Week 3): Pt will locate items to the left of midline during basic, famliar tasks with mod assist multimodal cues. SLP Short Term Goal 6 (Week 3): Pt will utilize increased vocal intensity and slow rate to achieve intelligibility at the word level with Min assist multimodal cues.  Skilled Therapeutic Interventions: Pt was seen for skilled ST targeting dysphagia and cognitive goals. SLP facilitated session with upgraded trial of dysphagia 3 textures with dentures donned (dentures have been unavailable until today). [name] mastication was still mildly prolonged but far more efficient with dentures donned in comparison to without dentures. Most cueing required was for bolus awareness, due to inattention to PO intake in general. Pt achieved oral clearance with Min A for use of liquid washes. No overt s/sx aspiration noted across solid or liquid intake. Recommend continue current diet, and ST will continue to provide opportunities for advancement; prognosis for further solid advancement is excellent with dentures donned. Pt also had several requests with SLP in room; Mod A multimodal  cueing provided for reorganization and problem solving functional tasks such as using phone to call her daughter to request a jacket, etc. Pt became labile when speaking to her daughter; emotional support provided and Pt also completed a basic calendar task with Mod A for it's functional use to orient to date, as well as visual scanning when marking off days that had already passed. Mod A verbal and visual cues provided during delayed recall of task (3-4 minute delay). Pt left sitting in chair with alarm set and needs within reach. Continue per current plan of care.        Pain Pain Assessment Pain Scale: 0-10 Pain Score: 4  Pain Type: Acute pain Pain Location: Head Pain Orientation: Right Pain Descriptors / Indicators: Headache Pain Onset: Gradual Pain Intervention(s): RN made aware Multiple Pain Sites: No  Therapy/Group: Individual Therapy  Arbutus Leas 01/27/2020, 7:12 AM

## 2020-01-27 NOTE — Progress Notes (Signed)
Patient ID: Anne [name], female   DOB: [birth year], 59 y.o.   MRN: [id number]   Patient declined by [facility] due to no bed availability.

## 2020-01-27 NOTE — Progress Notes (Signed)
Physical Therapy Session Note  Patient Details  Name: Anne [name] MRN: [id number] Date of Birth: 10/16/1960  Today's Date: 01/27/2020 PT Individual Time: 0900-1000 PT Individual Time Calculation (min): 60 min   Short Term Goals: Week 3:  PT Short Term Goal 1 (Week 3): Pt will perform WC mobility x 50ft with min assist PT Short Term Goal 2 (Week 3): Pt will transfer to [facility] with mod assist and LRAD PT Short Term Goal 3 (Week 3): Pt will maintain sitting balance EOB with supervision assist up to 5 min  Skilled Therapeutic Interventions/Progress Updates:   pt received in bed, nursing present and agreeable to therapy. Pt directed in supine>sit in bed at max A x1 with extra time to allow pt time to initiate and follow commands with manual assistance required for LLE and LUE placement throughout and VC for technique, single step cues, and tactile cues for trunk at midline and manual assist for hip alignment to facilitate to EOB. Pt directed in sitting EOB balance static and dynamic for ~8 mins with CGA static and improving to CGA dynamic with VC and intermittent min A for returning to midline as pt continues to laterally lean to L with loss of attention or fatigue. AFO donned with total assist on LLE at EOB for time. Pt directed in stand pivot to WC at max A x1 with multiple attempts to complete and encourage initiation, extra time to weight shift to allow RLE advancement to chair; manual assist for LLE placement, VC for tech and RUE placement. Pt directed in manual WC propulsion: attempted to initiate RLE for mobility however pt unable to attend to task and sequence despite single step cues and manual assist for tech; improved with BUE use for mobility with total assist for LUE to mimic RUE movement for forward propulsion for 30 ' x2 with extra time to complete and VC for initiation, tech, attending to task and use of BUE.  Pt directed in x1 STS with hemiwalker at max A x1 with multiple attempts to come  to standing with TC and VC for initiation, anterior weight shifting, tech. Pt then directed in static standing balance with hemiwalker with intermittent max A 1-2 for balance at midline with pt demonstrating poor spatial awareness, VC and TC for trunk extension, upward gaze, midline posture and head carriage, weight shifting to LLE, hip alignment. Pt left in WC, alarm set, all needs in reach and in good condition. Lap table in place with call light in hand.   Therapy Documentation Precautions:  Precautions Precautions: Other (comment) Precaution Comments: L hemi Restrictions Weight Bearing Restrictions: No    Therapy/Group: Individual Therapy  [name] 01/27/2020, 3:14 PM

## 2020-01-27 NOTE — Progress Notes (Signed)
[place] PHYSICAL MEDICINE & REHABILITATION PROGRESS NOTE   Subjective/Complaints:  No issues overnite, pt asked "how did my xray look?"  Review of systems: Limited due to cognition  Objective:   DG Cervical Spine Complete  Result Date: 01/26/2020 CLINICAL DATA:  Left neck pain EXAM: CERVICAL SPINE - COMPLETE 4+ VIEW COMPARISON:  None. FINDINGS: Five view radiograph of the cervical spine is limited by poor profiling of the left neural foramina and osseous overlap on a Don toy view. However, there is normal cervical lordosis. No fracture or listhesis of the cervical spine. Vertebral body height has been preserved. There is minimal intervertebral disc space narrowing and endplate remodeling at N3-6 and C6-7 in keeping with changes of mild degenerative disc disease. The spinal canal is widely patent. The prevertebral soft tissues are unremarkable. The right neural foramina appear widely patent. Prominent vascular calcifications are seen in the region of the right carotid bifurcation. IMPRESSION: No cervical fracture or listhesis. Prominent right carotid calcification. Electronically Signed   By: Fidela Salisbury MD   On: 01/26/2020 19:32   DG Swallowing Func-Speech Pathology  Result Date: 01/25/2020 Objective Swallowing Evaluation: Type of Study: MBS-Modified Barium Swallow Study  Patient Details Name: Anne [name] MRN: [id number] Date of Birth: [birth year] Today's Date: 01/25/2020 Past Medical History: Past Medical History: Diagnosis Date . Asthma  . CAD (coronary artery disease)   a. cardiac cath 02/04/2013: tubular 20% stenosis pRCA, tubular 30% stenosis mRCA, medically managed . Coronary vasospasm (HCC)   a. cardiac cath 11/25/2014: mLCx 30%, pRCA 30%, moderate spasm w/ noted improvement w/ reimaging, recommended CCB, long acting nitrate, smoking cessation, & statin  . Hypertension  . Polysubstance abuse ([place])   a. remote history of crack/cocaine abuse approximately 8-10 years ago, ongoing tobacco  abuse since age 89, rare etoh abuse . Tubular adenoma  Past Surgical History: Past Surgical History: Procedure Laterality Date . CARDIAC CATHETERIZATION N/A 11/25/2014  Procedure: Left Heart Cath and Coronary Angiography;  Surgeon: Minna Merritts, MD;  Location: [facility];  Service: Cardiovascular;  Laterality: N/A; . IR CT HEAD LTD  12/31/2019 . IR PERCUTANEOUS ART THROMBECTOMY/INFUSION INTRACRANIAL INC DIAG ANGIO  12/31/2019    . IR PERCUTANEOUS ART THROMBECTOMY/INFUSION INTRACRANIAL INC DIAG ANGIO  12/31/2019 . RADIOLOGY WITH ANESTHESIA Left 12/31/2019  Procedure: RADIOLOGY WITH ANESTHESIA;  Surgeon: Radiologist, Medication, MD;  Location: [facility];  Service: Radiology;  Laterality: Left; HPI: Pt is a 59 yo female presenting with L hemiplegia and R gaze preference. CT showed R MCA infarct. CTA revealed occlusion of R ICA. Pt is s/p thrombectomy on 6/17. PMH includes polysubstance abuse, HTN, coronary vasospasms, CAD, asthma. Pt admitted to [facility] 01/08/20.  Assessment / Plan / Recommendation CHL IP CLINICAL IMPRESSIONS 01/25/2020 Clinical Impression Pt presents with improvements in oropharyngeal swallow function in comparison to previous MBSS on 01/07/20, and pt's dysphagia is now characterized primarily by oral phase impairments. Although swallow initiation of thin barium occurred at the level of the pyriform sinuses, sometimes with a delay of 3-4 seconds, pt only intermittently displayed flash penetration, with all penetrates staying above the level of the vocal folds and being ejected from the vestibule during the swallow (PAS scale 2). Bolus size and/or use of straw vs cup for bolus delivery did not appear to influence airway protection today. No aspiration observed throughout study. [name] oral phase is characterized by weak lingual manipulation and prolonged mastication of soft solids and decreased bolus cohesion. [name] oral transit and efficiency of mastication was much improved  with finely chopped dysphagia 2  solids in comparison to dysphagia 3 soft solids. Pt unable to form cohesive bolus of dual consistency barium pill with thin or barium pill whole in applesauce, therefore, would recommend medications continue to be crushed for administration. However, would also recommend pt upgrade to dysphagia 2 solid textures and thin liquids. Please provide full supervision to ensure use of safe swallow precautions and sustained attention to intake. SLP Visit Diagnosis Dysphagia, oropharyngeal phase (R13.12) Attention and concentration deficit following -- Frontal lobe and executive function deficit following -- Impact on safety and function Mild aspiration risk   CHL IP TREATMENT RECOMMENDATION 01/07/2020 Treatment Recommendations Therapy as outlined in treatment plan below   Prognosis 01/07/2020 Prognosis for Safe Diet Advancement Good Barriers to Reach Goals Cognitive deficits Barriers/Prognosis Comment -- CHL IP DIET RECOMMENDATION 01/25/2020 SLP Diet Recommendations Dysphagia 2 (Fine chop) solids;Thin liquid Liquid Administration via Cup;Straw Medication Administration Crushed with puree Compensations Slow rate;Small sips/bites;Minimize environmental distractions Postural Changes Seated upright at 90 degrees   CHL IP OTHER RECOMMENDATIONS 01/25/2020 Recommended Consults -- Oral Care Recommendations Oral care BID Other Recommendations --   CHL IP FOLLOW UP RECOMMENDATIONS 01/08/2020 Follow up Recommendations Inpatient Rehab   CHL IP FREQUENCY AND DURATION 01/07/2020 Speech Therapy Frequency (ACUTE ONLY) min 2x/week Treatment Duration 2 weeks      CHL IP ORAL PHASE 01/25/2020 Oral Phase -- Oral - Pudding Teaspoon -- Oral - Pudding Cup -- Oral - Honey Teaspoon -- Oral - Honey Cup -- Oral - Nectar Teaspoon -- Oral - Nectar Cup -- Oral - Nectar Straw -- Oral - Thin Teaspoon -- Oral - Thin Cup -- Oral - Thin Straw -- Oral - Puree Piecemeal swallowing;Decreased bolus cohesion Oral - Mech Soft -- Oral - Regular -- Oral - Multi-Consistency  -- Oral - Pill -- Oral Phase - Comment --  CHL IP PHARYNGEAL PHASE 01/25/2020 Pharyngeal Phase Impaired Pharyngeal- Pudding Teaspoon -- Pharyngeal -- Pharyngeal- Pudding Cup -- Pharyngeal -- Pharyngeal- Honey Teaspoon -- Pharyngeal -- Pharyngeal- Honey Cup -- Pharyngeal -- Pharyngeal- Nectar Teaspoon -- Pharyngeal -- Pharyngeal- Nectar Cup NT Pharyngeal -- Pharyngeal- Nectar Straw NT Pharyngeal -- Pharyngeal- Thin Teaspoon -- Pharyngeal -- Pharyngeal- Thin Cup Delayed swallow initiation-pyriform sinuses;Penetration/Aspiration during swallow Pharyngeal Material enters airway, remains ABOVE vocal cords then ejected out Pharyngeal- Thin Straw Delayed swallow initiation-pyriform sinuses Pharyngeal -- Pharyngeal- Puree Delayed swallow initiation-vallecula Pharyngeal -- Pharyngeal- Mechanical Soft Delayed swallow initiation-vallecula Pharyngeal -- Pharyngeal- Regular NT Pharyngeal -- Pharyngeal- Multi-consistency -- Pharyngeal -- Pharyngeal- Pill (No Data) Pharyngeal -- Pharyngeal Comment --  CHL IP CERVICAL ESOPHAGEAL PHASE 01/25/2020 Cervical Esophageal Phase WFL Pudding Teaspoon -- Pudding Cup -- Honey Teaspoon -- Honey Cup -- Nectar Teaspoon -- Nectar Cup -- Nectar Straw -- Thin Teaspoon -- Thin Cup -- Thin Straw -- Puree -- Mechanical Soft -- Regular -- Multi-consistency -- Pill -- Cervical Esophageal Comment -- Arbutus Leas 01/25/2020, 10:19 AM              Recent Labs    01/25/20 0634  WBC 7.6  HGB 12.3  HCT 38.5  PLT 228   Recent Labs    01/25/20 0634  NA 138  K 3.9  CL 108  CO2 20*  GLUCOSE 93  BUN 20  CREATININE 0.82  CALCIUM 10.1    Intake/Output Summary (Last 24 hours) at 01/27/2020 0853 Last data filed at 01/27/2020 0837 Gross per 24 hour  Intake 970 ml  Output --  Net 970 ml  Physical Exam: Vital Signs Blood pressure 136/77, pulse 65, temperature 99.7 F (37.6 C), resp. rate 14, weight 91 kg, SpO2 96 %.  General: No acute distress Mood and affect are appropriate Heart:  Regular rate and rhythm no rubs murmurs or extra sounds Lungs: Clear to auscultation, breathing unlabored, no rales or wheezes Abdomen: Positive bowel sounds, soft nontender to palpation, nondistended Extremities: No clubbing, cyanosis, or edema Skin: No evidence of breakdown, no evidence of rash Neurologic: Cranial nerves II through XII intact, motor strength is 5/5 in right and trace 2- deltoid, bicep, tricep, grip, hip flexor, knee extensors, ankle dorsiflexor and plantar flexor + left neglect  Musculoskeletal: Full range of motion in all 4 extremities. No joint swelling  Assessment/Plan: 1. 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  which require 3+ hours per day of interdisciplinary therapy in a comprehensive inpatient rehab setting.  Physiatrist is providing close team supervision and 24 hour management of active medical problems listed below.  Physiatrist and rehab team continue to assess barriers to discharge/monitor patient progress toward functional and medical goals  Care Tool:  Bathing    Body parts bathed by patient: Left arm, Chest, Abdomen, Front perineal area, Right upper leg, Left upper leg   Body parts bathed by helper: Buttocks, Right arm, Right lower leg, Left lower leg, Face     Bathing assist Assist Level: Moderate Assistance - Patient 50 - 74%     Upper Body Dressing/Undressing Upper body dressing   What is the patient wearing?: Dress    Upper body assist Assist Level: Maximal Assistance - Patient 25 - 49%    Lower Body Dressing/Undressing Lower body dressing      What is the patient wearing?: Incontinence brief, Pants     Lower body assist Assist for lower body dressing: Total Assistance - Patient < 25%     Toileting Toileting    Toileting assist Assist for toileting: 2 Helpers     Transfers Chair/bed transfer  Transfers assist     Chair/bed transfer assist level: Maximal Assistance - Patient 25 - 49%      Locomotion Ambulation   Ambulation assist   Ambulation activity did not occur: Safety/medical concerns  Assist level: Total Assistance - Patient < 25% Assistive device:  (wall rail) Max distance: 3 steps   Walk 10 feet activity   Assist  Walk 10 feet activity did not occur: Safety/medical concerns        Walk 50 feet activity   Assist Walk 50 feet with 2 turns activity did not occur: Safety/medical concerns         Walk 150 feet activity   Assist Walk 150 feet activity did not occur: Safety/medical concerns         Walk 10 feet on uneven surface  activity   Assist Walk 10 feet on uneven surfaces activity did not occur: Safety/medical concerns         Wheelchair     Assist Will patient use wheelchair at discharge?: Yes Type of Wheelchair: Manual    Wheelchair assist level: Dependent - Patient 0% Max wheelchair distance: 150    Wheelchair 50 feet with 2 turns activity    Assist        Assist Level: Dependent - Patient 0%   Wheelchair 150 feet activity     Assist      Assist Level: Dependent - Patient 0%   Blood pressure 136/77, pulse 65, temperature 99.7 F (37.6 C), resp. rate 14, weight 91  kg, SpO2 96 %.  Medical Problem List and Plan: 1. Left hemiparesis with right gaze preference, has difficulty tracking beyond midline to the left and continues to have limited verbal output secondary to large right MCA infarct.   Continue CIR, Team conference today please see physician documentation under team conference tab, met with team  to discuss problems,progress, and goals. Formulized individual treatment plan based on medical history, underlying problem and comorbidities.   PRAFO/WHO qhs 2. Antithrombotics: -DVT/anticoagulation: Pharmaceutical: Lovenox -antiplatelet therapy: On ASA/Plavix 3. Pain Management: Tylenol prn. Sports creme for Right muscular thigh pain exercise related   C spine xray unremarkable  4.  Mood: LCSW to follow for evaluation and support.  -antipsychotic agents: N/A 5. Neuropsych: This patient is not fully capable of making decisions on his own behalf. 6. Skin/Wound Care: Routine pressure relief measures 7. Fluids/Electrolytes/Nutrition: Monitor I/Os. Monitor I's and O's  8. HTN: Monitor BP tid--Imdur, lisinopril, cardizem and amlodipine has been on hold.  Cardizem 30 mg qid  Vitals:   01/26/20 2336 01/27/20 0551  BP: (!) 142/66 136/77  Pulse: 60 65  Resp: 18 14  Temp: 98.1 F (36.7 C) 99.7 F (37.6 C)  SpO2: 98% 96%   7/14 controlled  9. UTI: Resolved  10. COPD: Continue Pulmicort nebs bid. 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 To contact Dr. Katherina Right closer to discharge.  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  7/13: Upgrade to D2 thins based on swallow study 13.  Sleep disturbance?: Will monitor sleep wake cycle. Was on ambien and trazodone PTA? Ambien prn for now.   Improving  14. Azotemia due to dysphagia/elevated BUN   15. Hyperglycemia?  Resolved 16. Constipation: Resolved, hold on further PRN laxative 17.  Poor initiation methylphenidate increased to 66m monitor effect   LOS: 19 days A FACE TO FACE EVALUATION WAS PERFORMED  ACharlett Blake7/14/2021, 8:53 AM

## 2020-01-27 NOTE — Progress Notes (Signed)
Patient ID: Anne [name], female   DOB: 1961/01/31, 59 y.o.   MRN: [id number] Team Conference Report to Patient/Family  Team Conference discussion was reviewed with the patient and caregiver, including goals, any changes in plan of care and target discharge date.  Patient and caregiver express understanding and are in agreement.  The patient has a target discharge date of  (SNF recommended).  [name] 01/27/2020, 2:07 PM

## 2020-01-27 NOTE — Progress Notes (Signed)
Patient ID: Anne [name], female   DOB: Feb 06, 1961, 59 y.o.   MRN: [id number]   Sw reached out to patient caseworker to possibly transition Medicaid plan to adult medicaid. SW will follow up  [place]

## 2020-01-27 NOTE — Progress Notes (Signed)
Patient ID: Anne [name], female   DOB: 1961/03/24, 59 y.o.   MRN: [id number]   Patient declined by [facility] due to care needs exceeding faciltity capability, will follow up

## 2020-01-27 NOTE — Progress Notes (Signed)
Nutrition Follow-up  DOCUMENTATION CODES:   Obesity unspecified  INTERVENTION:   - Continue Magic CupTID with meals, each supplement provides 290 kcal and 9 grams of protein  - Continue Pro-stat 30 ml po BID, each supplement provides 100 kcal and 15 grams of protein  - Continue MVI with minerals daily  NUTRITION DIAGNOSIS:   Inadequate oral intake related to dysphagia as evidenced by meal completion < 50%.  Progressing  GOAL:   Patient will meet greater than or equal to 90% of their needs  Progressing  MONITOR:   PO intake, Supplement acceptance, Diet advancement, Labs, Weight trends  REASON FOR ASSESSMENT:   Consult Calorie Count  ASSESSMENT:   59 yo female admitted to Rehab from [facility] S/P stroke with functional deficits. PMH includes CAD, coronary vasospasms, asthma, tobacco abuse.  6/17 - s/p thrombectomy 6/18 - Cortrak placed 6/24 - s/p MBS, diet advanced to dysphagia 1 with nectar-thick liquids 6/25 - transferred to CIR, transitioned to nocturnal TF 7/07 - Cortrak removed, nocturnal TF d/c 7/12 - s/p MBS, diet advanced to dysphagia 2 with thin liquids  Weight stable over the last month.  Spoke with pt at bedside who reports improved appetite. Pt states that she did not eat her Magic Cup this morning but did consume eggs, sausage, and orange juice. RD encouraged continued adequate PO intake and supplement acceptance. Pt accepting most Pro-stat supplements per [facility] documentation.  Meal Completion: 25-80%  Medications reviewed and include: Pro-stat 30 ml BID, Megace, Ritalin, MVI with minerals, thiamine  Labs reviewed.  Diet Order:   Diet Order            DIET DYS 2 Room service appropriate? Yes; Fluid consistency: Thin  Diet effective now                 EDUCATION NEEDS:   Education needs have been addressed  Skin:  Skin Assessment: Skin Integrity Issues: Incisions: right groin  Last BM:  01/25/20 medium type 4  Height:   Ht Readings  from Last 1 Encounters:  12/31/19 5\' 6"  (1.676 m)    Weight:   Wt Readings from Last 1 Encounters:  01/25/20 91 kg    Ideal Body Weight:  59.1 kg  BMI:  Body mass index is 32.38 kg/m.  Estimated Nutritional Needs:   Kcal:  1800-2000  Protein:  100-120 gm  Fluid:  >/= 1.8 L    Gaynell Face, MS, RD, LDN Inpatient Clinical Dietitian Please see AMiON for contact information.

## 2020-01-27 NOTE — Progress Notes (Signed)
Patient ID: Anne [name], female   DOB: 1960/08/21, 59 y.o.   MRN: [id number]   Patient declined by [facility] due to insurance not in network

## 2020-01-27 NOTE — Progress Notes (Signed)
Patient ID: Anne [name], female   DOB: 1961/06/19, 59 y.o.   MRN: [id number]   Paper referral faxed to [facility] [phone]

## 2020-01-27 NOTE — Progress Notes (Signed)
Patient ID: Anne [name], female   DOB: 1960-10-14, 59 y.o.   MRN: [id number]   Daughter followed up with SW to inform she will talk to her mom about: [facility] and [facility].   SW completed and has new signed FL2. Will resend referral to SNF facilities in [place] and [place] due to feeding tub being removed last week.

## 2020-01-27 NOTE — Patient Care Conference (Signed)
Inpatient RehabilitationTeam Conference and Plan of Care Update Date: 01/27/2020   Time: 1:48 PM    Patient Name: Anne [name]      Medical Record Number: [id number]  Date of Birth: [birth year] Sex: Female         Room/Bed: 4W18C/4W18C-01 Payor Info: Payor: Eddy Bransford / Plan: [place] / Product Type: *No Product type* /    Admit Date/Time:  01/08/2020  4:39 PM  Primary Diagnosis:  Acute right MCA stroke [facility])  Hospital Problems: Principal Problem:   Acute right MCA stroke ([place]) Active Problems:   Elevated BUN   Sleep disturbance   Left foot pain   Hemiparesis affecting left side as late effect of stroke [facility])    Expected Discharge Date: Expected Discharge Date:  (SNF recommended)  Team Members Present: Physician leading conference: Dr. Alysia Penna Care Coodinator Present: Dorien Chihuahua, RN, BSN, CRRN;[name] Darien, [place] Nurse Present: Other (comment) Ronalee Belts, RN) PT Present: Barrie Folk, PT OT Present: Darleen Crocker, OT SLP Present: Jettie Booze, CF-SLP PPS Coordinator present : Gunnar Fusi, SLP     Current Status/Progress Goal Weekly Team Focus  Bowel/Bladder   pt is incontinent of b/b, PVR perfromed per order, LBM 01/25/20  become continent of bowel and bladder  Q2h toileting/ PRN   Swallow/Nutrition/ Hydration   dysphagia 2, thin, Min-Mod A, meds crushed  Supervision  tolerance upgraded diet, trials advanced solids, independence with strategies   ADL's   max A stand pivot transfer, dynamic sitting balance EOB CGA, +2 stedy for toileting needs, MAx UB self care, total A LB self care  mod A overall  self care retraining balance, balance, functional transfers, L NMR, visual scanning/inattention   Mobility   modA bed mobility, mod to maxA stand and squat  pivot transfer, maxA gait at wall 25-35ft, improved midline orientation,  mod A w/c level  standing balance, transfers, L NMR   Communication   Min-Mod A  intelligibility  min A  carryover speech intelligibility strategies   Safety/Cognition/ Behavioral Observations  Mod-Max A  Min A  visual scanning, attention, recall, functional problem solving   Pain   Pt c/o pain in the L knee/hip, faces pain scale indicates pain is 4 out of 10.  decrease amount of pain   Assess pain shift/prn. PRN tylenol effective   Skin   no obvious signs of skin breakdown at this time  remain free of infection and skin break down  Assess skin qshift     Team Discussion:  Discharge Planning/Teaching Needs:  Goal to discharge to SNF  Will schedule if reccomended   Current Update: on target  Current Barriers to Discharge:  Decreased caregiver support, Incontinence and Insurance for SNF coverage Limited return of function, severe neglect, poor initiation.  Possible Resolutions to Barriers: SNF Toileting protocol MD: Ritalin trial for initiation without effect   Patient on target to meet rehab goals: yes, noted plateau of progress at MOD-MAX A   *See Care Plan and progress notes for long and short-term goals.   Revisions to Treatment Plan:  Discussion of need to change therapy to 15/7 schedule Changed to D2-thin diet with cues for oral clearance Providing cues for midline positioning of head and trunk, cues for attention and scanning to affected side    Medical Summary Current Status: Orientation improving remains incont Weekly Focus/Goal: improve self feeding  Barriers to Discharge: Medical stability   Possible Resolutions to Barriers: non pharm tx of Right trap pain  Continued Need for Acute Rehabilitation Level of Care: The patient requires daily medical management by a physician with specialized training in physical medicine and rehabilitation for the following reasons: Direction of a multidisciplinary physical rehabilitation program to maximize functional independence : Yes Medical management of patient stability for increased activity during  participation in an intensive rehabilitation regime.: Yes Analysis of laboratory values and/or radiology reports with any subsequent need for medication adjustment and/or medical intervention. : Yes   I attest that I was present, lead the team conference, and concur with the assessment and plan of the team.   Dorien Chihuahua B 01/27/2020, 1:48 PM

## 2020-01-28 ENCOUNTER — Inpatient Hospital Stay (HOSPITAL_COMMUNITY): Payer: Medicaid Other | Admitting: Speech Pathology

## 2020-01-28 ENCOUNTER — Inpatient Hospital Stay (HOSPITAL_COMMUNITY): Payer: Medicaid Other | Admitting: Occupational Therapy

## 2020-01-28 ENCOUNTER — Inpatient Hospital Stay (HOSPITAL_COMMUNITY): Payer: Medicaid Other | Admitting: Physical Therapy

## 2020-01-28 MED ORDER — LISINOPRIL 2.5 MG PO TABS
2.5000 mg | ORAL_TABLET | Freq: Every day | ORAL | Status: DC
  Administered 2020-01-28 – 2020-02-16 (×20): 2.5 mg via ORAL
  Filled 2020-01-28 (×20): qty 1

## 2020-01-28 NOTE — Plan of Care (Signed)
°  Problem: RH Problem Solving Goal: LTG Patient will demonstrate problem solving for (SLP) Description: LTG:  Patient will demonstrate problem solving for basic/complex daily situations with cues  (SLP) Flowsheets (Taken 01/28/2020 1454) LTG Patient will demonstrate problem solving for: Moderate Assistance - Patient 50 - 74% Note: Downgraded due to slower than expected progress   Problem: RH Memory Goal: LTG Patient will use memory compensatory aids to (SLP) Description: LTG:  Patient will use memory compensatory aids to recall biographical/new, daily complex information with cues (SLP) Note: Downgraded due to slower than expected progress   Problem: RH Attention Goal: LTG Patient will demonstrate this level of attention during functional activites (SLP) Description: LTG:  Patient will will demonstrate this level of attention during functional activites (SLP) 01/28/2020 1455 by Arbutus Leas, [place] (Taken 01/28/2020 1455) Number of minutes patient will demonstrate attention during cognitive/linguistic activities: 10 01/28/2020 1454 by Arbutus Leas, CCC-SLP Note: Downgraded due to slower than expected progress   Problem: RH Awareness Goal: LTG: Patient will demonstrate awareness during functional activites type of (SLP) Description: LTG: Patient will demonstrate awareness during functional activites type of (SLP) Note: Downgraded due to slower than expected progress

## 2020-01-28 NOTE — Progress Notes (Signed)
Patient ID: Anne [name], female   DOB: 11/28/1960, 59 y.o.   MRN: [id number]   [facility] has accepted patient. Facility will attempt to get authorization from [employer]. This process will take a couple days. If facility, can get authorization they will able to take her.

## 2020-01-28 NOTE — Progress Notes (Signed)
Occupational Therapy Session Note  Patient Details  Name: Shuna Tabor MRN: [id number] Date of Birth: 25-Jun-1961  Today's Date: 01/28/2020 OT Individual Time: 1445-1600 OT Individual Time Calculation (min): 75 min    Short Term Goals: Week 3:  OT Short Term Goal 1 (Week 3): STGs=LTGs secondary to upcoming discharge  Skilled Therapeutic Interventions/Progress Updates:    Pt received in bed sleeping but awoke easily. With +2 A, stand pivot to w/c max A.  Taken to gym and transferred to mat with max of 2 for squat pivot.  Less initiation with squat pivot.    Pt with MINIMAL engagement needing MAX cues to attend to L side, to attend to activity.  Worked on +2 sit to stand several times for less than 10 sec each time as pt tried to sit.  LUE NMR with hand over hand guiding, dynamic reaching, visual scanning.  Pt did tolerate sitting unsupported for almost an hour with no LOB.  Sit to stand to w/c with max A +2 stand pivot and then back to bed with all needs met.  Bed alarm set.  Therapy Documentation Precautions:  Precautions Precautions: Other (comment) Precaution Comments: L hemi Restrictions Weight Bearing Restrictions: No    Vital Signs: Therapy Vitals Temp: 98.3 F (36.8 C) Temp Source: Oral Pulse Rate: 69 BP: (!) 152/74 Patient Position (if appropriate): Lying Oxygen Therapy SpO2: 98 % O2 Device: Room Air Pain: Pain Assessment Pain Scale: Faces Faces Pain Scale: Hurts even more      Therapy/Group: Individual Therapy  [place] 01/28/2020, 8:56 AM

## 2020-01-28 NOTE — Progress Notes (Signed)
Speech Language Pathology Weekly Progress Note  Patient Details  Name: Anne [name] MRN: [id number] Date of Birth: August 24, 1960  Beginning of progress report period: January 22, 2020 End of progress report period: January 29, 2020    Short Term Goals: Week 3: SLP Short Term Goal 1 (Week 3): Pt will consume current diet with Supervision A verbal cues for use of swallow strategies for oral clearance. SLP Short Term Goal 2 (Week 3): Pt will participate in repeat MBSS to instrumentally assess oropharyngeal swallow function. SLP Short Term Goal 2 - Progress (Week 3): Met SLP Short Term Goal 3 (Week 3): Pt will sustain her attention to basic functional tasks for 3-5 minute intervals with mod assist verbal cues for redirection. SLP Short Term Goal 3 - Progress (Week 3): Partly met SLP Short Term Goal 4 (Week 3): Pt will complete basic, familiar tasks with mod assist multimodal cues for functional problem solving. SLP Short Term Goal 4 - Progress (Week 3): Met SLP Short Term Goal 5 (Week 3): Pt will locate items to the left of midline during basic, famliar tasks with mod assist multimodal cues. SLP Short Term Goal 5 - Progress (Week 3): Met SLP Short Term Goal 6 (Week 3): Pt will utilize increased vocal intensity and slow rate to achieve intelligibility at the word level with Min assist multimodal cues. SLP Short Term Goal 6 - Progress (Week 3): Met    New Short Term Goals: Week 4: SLP Short Term Goal 1 (Week 4): STG=LTG due to remaining LOS  Weekly Progress Updates: Pt has made functional gains and met 4 out of 5 short term goals this reporting period. Pt is currently Mod assist for basic tasks due to cognitive impairments impacting her attention, problem solving, awareness, and short term memory. Pt is consuming a dysphagia 2 (fine chop/minced) diet with thin liquids, which was advanced after her MBSS earlier this week. Pt has demonstrated improved oropharyngeal swallow function as well as basic  problem solving and recall. Pt and family education is ongoing. Pt would continue to benefit from skilled ST while inpatient in order to maximize functional independence and reduce burden of care prior to discharge. Anticipate that pt will need 24/7 supervision at discharge in addition to [facility] follow up at next level of care - pt is current awaiting SNF placement, which was recommended as next venue of care by therapy team due to intense level of assistance still required at this time.      Intensity: Minumum of 1-2 x/day, 30 to 90 minutes Frequency: 3 to 5 out of 7 days Duration/Length of Stay: 02/05/20, awaiting SNF offer Treatment/Interventions: Cognitive remediation/compensation;Cueing hierarchy;Environmental controls;Dysphagia/aspiration precaution training;Internal/external aids;Functional tasks;Patient/family education     Arbutus Leas 01/29/2020, 7:21 AM

## 2020-01-28 NOTE — Plan of Care (Signed)
  Problem: RH BOWEL ELIMINATION Goal: RH STG MANAGE BOWEL WITH ASSISTANCE Description: STG Manage Bowel with  Min Assistance. Outcome: Not Progressing; incontinence   Problem: RH SKIN INTEGRITY Goal: RH STG SKIN FREE OF INFECTION/BREAKDOWN Description: Free of breakdown with min assist Outcome: Progressing; incontinence

## 2020-01-28 NOTE — Progress Notes (Signed)
Speech Language Pathology Daily Session Note  Patient Details  Name: Anne [name] MRN: [id number] Date of Birth: [birth year]  Today's Date: 01/28/2020 SLP Individual Time: 5573-2202 SLP Individual Time Calculation (min): 57 min  Short Term Goals: Week 3: SLP Short Term Goal 1 (Week 3): Pt will consume current diet with Supervision A verbal cues for use of swallow strategies for oral clearance. SLP Short Term Goal 2 (Week 3): Pt will participate in repeat MBSS to instrumentally assess oropharyngeal swallow function. SLP Short Term Goal 3 (Week 3): Pt will sustain her attention to basic functional tasks for 3-5 minute intervals with mod assist verbal cues for redirection. SLP Short Term Goal 4 (Week 3): Pt will complete basic, familiar tasks with mod assist multimodal cues for functional problem solving. SLP Short Term Goal 5 (Week 3): Pt will locate items to the left of midline during basic, famliar tasks with mod assist multimodal cues. SLP Short Term Goal 6 (Week 3): Pt will utilize increased vocal intensity and slow rate to achieve intelligibility at the word level with Min assist multimodal cues.  Skilled Therapeutic Interventions: Pt was seen for skilled ST targeting dysphagia and cognitive goals. SLP provided upgraded trial of dysphagia 3 (mechanical soft) snack along with thin liquids via straw. [name] dentured donned during trial. [name] mastication of dysphagia 3 solids was functional and she cleared trace residue (mainly on dentition) with Supervision A verbal cues for use of liquid wash. Intermittently she required verbal cueing for bolus awareness, to cease holding, mainly due to internal distractibility and decreased initiation. Pt required a significant amount of time to eat and complete other tasks, despite Max A cueing for initiation and attempts to redirect to task. Recommend continue current diet for now and attempt larger quantity trial at next available date. Pt required Mod A verbal  and visual cues for problem solving, Max A for error awareness during a basic money management task in which she had to display set amounts of change. Pt did sort coins by type with only Min A verbal cues for scanning and a visual/written aid to aid with with recall and organization. Task became largely not functional due to pt unable to sustain attention and becoming perseverative on moving coins around on her table. Task switched to matching; pt matched colored blocks to a color pattern board with Min A for problem solving and error awareness, Mod A for initiation and sustained attention and locating blocks left of midline. Pt left laying in bed with alarm set and needs within reach. Continue per current plan of care.         Pain Pain Assessment Pain Scale: Faces Faces Pain Scale: No hurt  Therapy/Group: Individual Therapy  Arbutus Leas 01/28/2020, 7:23 AM

## 2020-01-28 NOTE — Progress Notes (Signed)
Physical Therapy Session Note  Patient Details  Name: Anne [name] MRN: [id number] Date of Birth: 10-27-1960  Today's Date: 01/28/2020 PT Individual Time: 0805-0904 PT Individual Time Calculation (min): 59 min   Short Term Goals: Week 2:  PT Short Term Goal 1 (Week 2): Pt will transfer to and from med with max assist of 1 consistently PT Short Term Goal 1 - Progress (Week 2): Met PT Short Term Goal 2 (Week 2): Family will begin hands on education PT Short Term Goal 2 - Progress (Week 2): Progressing toward goal PT Short Term Goal 3 (Week 2): Pt will perform WC mobility with hemi technique with mod assist x 50 ft PT Short Term Goal 3 - Progress (Week 2): Met PT Short Term Goal 4 (Week 2): Pt will perform gait training with HW x 56f and max assist +2. PT Short Term Goal 4 - Progress (Week 2): Met Week 3:  PT Short Term Goal 1 (Week 3): Pt will perform WC mobility x 523fwith min assist PT Short Term Goal 2 (Week 3): [facility] PT Short Term Goal 3 (Week 3): Pt will maintain sitting balance EOB with supervision assist up to 5 min  Skilled Therapeutic Interventions/Progress Updates:   Pt received supine in bed and agreeable to PT. Noted to have incontinent bowel and bladder. Rolling R and L with mod assist to doff and don brief as well as for PT to perform pericare. PT then donned pants at bed level for time management  Supine>sit transfer with max assist through log roll to come to sidelying on the R then push into sitting. Sitting balance EOB with supervision assist on this day x 5 minutes. Min cues for midline, but able to hold once in proper position.   Stand [facility] and LLE blocked after 5 attempts. Pt noted to have decreased initiation to perform transfer on this as well as increased emotional lability. Therapeutic use of self to console pt and provide emotional support.   PT assisted pt to perform [facility] and forced visual scanning to the L. Max cues for improved awareness of L side as well as improved initiation of movement to reach for and grasp for utensil throughout. PT also required to remove napkin and towel from pt, as pt was demonstrating perseverative behavior of wiping mouth and nose. Pt able to consume approx 10-15% of meal and 2 cups of Orange juice.  MD present in session for assessment and Pt led with RN for medication administration at end of session.         Therapy Documentation Precautions:  Precautions Precautions: Other (comment) Precaution Comments: L hemi Restrictions Weight Bearing Restrictions: No Vital Signs: Therapy Vitals Temp: 98.3 F (36.8 C) Temp Source: Oral Pulse Rate: 69 BP: (!) 152/74 Patient Position (if appropriate): Lying Oxygen Therapy SpO2: 98 % O2 Device: Room Air Pain: Pain Assessment Pain Scale: Faces Faces Pain Scale: Hurts even more    Therapy/Group: Individual Therapy  AuLorie Phenix/15/2021, 9:05 AM

## 2020-01-28 NOTE — Progress Notes (Signed)
Pt requesting stronger pain medication and reuqesting the topical rub to be put on her leg q2h. Provider will be notified

## 2020-01-28 NOTE — Progress Notes (Addendum)
Anne [name] PHYSICAL MEDICINE & REHABILITATION PROGRESS NOTE   Subjective/Complaints:  Had LLE pain last noc per RN , pt has had RLE pain in past but not left, no problems noted in PT  Review of systems: Limited due to cognition  Objective:   DG Cervical Spine Complete  Result Date: 01/26/2020 CLINICAL DATA:  Left neck pain EXAM: CERVICAL SPINE - COMPLETE 4+ VIEW COMPARISON:  None. FINDINGS: Five view radiograph of the cervical spine is limited by poor profiling of the left neural foramina and osseous overlap on a Don toy view. However, there is normal cervical lordosis. No fracture or listhesis of the cervical spine. Vertebral body height has been preserved. There is minimal intervertebral disc space narrowing and endplate remodeling at O6-7 and C6-7 in keeping with changes of mild degenerative disc disease. The spinal canal is widely patent. The prevertebral soft tissues are unremarkable. The right neural foramina appear widely patent. Prominent vascular calcifications are seen in the region of the right carotid bifurcation. IMPRESSION: No cervical fracture or listhesis. Prominent right carotid calcification. Electronically Signed   By: Fidela Salisbury MD   On: 01/26/2020 19:32   No results for input(s): WBC, HGB, HCT, PLT in the last 72 hours. No results for input(s): NA, K, CL, CO2, GLUCOSE, BUN, CREATININE, CALCIUM in the last 72 hours.  Intake/Output Summary (Last 24 hours) at 01/28/2020 0841 Last data filed at 01/27/2020 2000 Gross per 24 hour  Intake 480 ml  Output --  Net 480 ml     Physical Exam: Vital Signs Blood pressure (!) 152/74, pulse 69, temperature 98.3 F (36.8 C), temperature source Oral, resp. rate 16, weight 91 kg, SpO2 98 %.  General: No acute distress Mood and affect are appropriate Heart: Regular rate and rhythm no rubs murmurs or extra sounds Lungs: Clear to auscultation, breathing unlabored, no rales or wheezes Abdomen: Positive bowel sounds, soft nontender  to palpation, nondistended Extremities: No clubbing, cyanosis, or edema Skin: No evidence of breakdown, no evidence of rash Neurologic: Cranial nerves II through XII intact, motor strength is 5/5 in right and trace 2- deltoid, bicep, tricep, grip, hip flexor, knee extensors, ankle dorsiflexor and plantar flexor + left neglect  Musculoskeletal: Full range of motion in all 4 extremities. No joint swelling, no pain to palpation except mild / minimal Left calf tenderness, neg THompson's, neg pain with Achilles stretch , no calf swelling or eythema  Assessment/Plan: 1. 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  which require 3+ hours per day of interdisciplinary therapy in a comprehensive inpatient rehab setting.  Physiatrist is providing close team supervision and 24 hour management of active medical problems listed below.  Physiatrist and rehab team continue to assess barriers to discharge/monitor patient progress toward functional and medical goals  Care Tool:  Bathing    Body parts bathed by patient: Left arm, Chest, Abdomen, Front perineal area, Right upper leg, Left upper leg   Body parts bathed by helper: Buttocks, Right arm, Right lower leg, Left lower leg, Face     Bathing assist Assist Level: Moderate Assistance - Patient 50 - 74%     Upper Body Dressing/Undressing Upper body dressing   What is the patient wearing?: Dress    Upper body assist Assist Level: Maximal Assistance - Patient 25 - 49%    Lower Body Dressing/Undressing Lower body dressing      What is the patient wearing?: Incontinence brief, Pants     Lower body assist Assist for lower body dressing:  Total Assistance - Patient < 25%     Toileting Toileting    Toileting assist Assist for toileting: 2 Helpers     Transfers Chair/bed transfer  Transfers assist     Chair/bed transfer assist level: Maximal Assistance - Patient 25 - 49%     Locomotion Ambulation   Ambulation assist    Ambulation activity did not occur: Safety/medical concerns  Assist level: Total Assistance - Patient < 25% Assistive device:  (wall rail) Max distance: 3 steps   Walk 10 feet activity   Assist  Walk 10 feet activity did not occur: Safety/medical concerns        Walk 50 feet activity   Assist Walk 50 feet with 2 turns activity did not occur: Safety/medical concerns         Walk 150 feet activity   Assist Walk 150 feet activity did not occur: Safety/medical concerns         Walk 10 feet on uneven surface  activity   Assist Walk 10 feet on uneven surfaces activity did not occur: Safety/medical concerns         Wheelchair     Assist Will patient use wheelchair at discharge?: Yes Type of Wheelchair: Manual    Wheelchair assist level: Dependent - Patient 0% Max wheelchair distance: 150    Wheelchair 50 feet with 2 turns activity    Assist        Assist Level: Dependent - Patient 0%   Wheelchair 150 feet activity     Assist      Assist Level: Dependent - Patient 0%   Blood pressure (!) 152/74, pulse 69, temperature 98.3 F (36.8 C), temperature source Oral, resp. rate 16, weight 91 kg, SpO2 98 %.  Medical Problem List and Plan: 1. Left hemiparesis with right gaze preference, has difficulty tracking beyond midline to the left and continues to have limited verbal output secondary to large right MCA infarct.   Continue CIR, PT, OT, SLP SNF  Pending FL-2 completed   PRAFO/WHO qhs 2. Antithrombotics: -DVT/anticoagulation: Pharmaceutical: Lovenox -antiplatelet therapy: On ASA/Plavix 3. Pain Management: Tylenol prn. Sports creme Left calf tenderness   C spine xray unremarkable  4. Mood: LCSW to follow for evaluation and support.  -antipsychotic agents: N/A 5. Neuropsych: This patient is not fully capable of making decisions on his own behalf. 6. Skin/Wound Care: Routine pressure relief measures 7.  Fluids/Electrolytes/Nutrition: Monitor I/Os. Monitor I's and O's  8. HTN: Monitor BP tid--Imdur, lisinopril, cardizem and amlodipine has been on hold.  Cardizem 30 mg qid  Vitals:   01/28/20 0013 01/28/20 0513  BP: (!) 142/67 (!) 152/74  Pulse: 64 69  Resp:    Temp:  98.3 F (36.8 C)  SpO2:  31%   5/17 systolic elevation resume low dose lisinopril  9. UTI: Resolved  10. COPD: Continue Pulmicort nebs bid. 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 To contact Dr. Katherina Right closer to discharge.  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  7/13: Upgrade to D2 thins based on swallow study 13.  Sleep disturbance?: Will monitor sleep wake cycle. Was on ambien and trazodone PTA? Ambien prn for now.   Improving  14. Azotemia due to dysphagia/elevated BUN   15. Hyperglycemia?  Resolved 16. Constipation: Resolved, hold on further PRN laxative 17.  Poor initiation methylphenidate increased to 10mg  monitor effect   LOS: 20 days A FACE TO FACE EVALUATION WAS PERFORMED  Luanna Salk Gustavus Haskin 01/28/2020, 8:41 AM

## 2020-01-29 ENCOUNTER — Inpatient Hospital Stay (HOSPITAL_COMMUNITY): Payer: Medicaid Other | Admitting: Occupational Therapy

## 2020-01-29 ENCOUNTER — Inpatient Hospital Stay (HOSPITAL_COMMUNITY): Payer: Medicaid Other | Admitting: Physical Therapy

## 2020-01-29 NOTE — Plan of Care (Signed)
  Problem: RH BOWEL ELIMINATION Goal: RH STG MANAGE BOWEL WITH ASSISTANCE Description: STG Manage Bowel with  Min Assistance. Outcome: Progressing   Problem: RH BLADDER ELIMINATION Goal: RH STG MANAGE BLADDER WITH ASSISTANCE Description: STG Manage Bladder With  Min Assistance  Outcome: Progressing   Problem: RH SKIN INTEGRITY Goal: RH STG SKIN FREE OF INFECTION/BREAKDOWN Description: Free of breakdown with min assist Outcome: Progressing   Problem: RH SAFETY Goal: RH STG ADHERE TO SAFETY PRECAUTIONS W/ASSISTANCE/DEVICE Description: STG Adhere to Safety Precautions With Min Assistance/Device.  Outcome: Progressing   Problem: RH COGNITION-NURSING Goal: RH STG USES MEMORY AIDS/STRATEGIES W/ASSIST TO PROBLEM SOLVE Description: STG Uses Memory Aids/Strategies With Min Assistance to Problem Solve.  Outcome: Progressing   Problem: RH KNOWLEDGE DEFICIT Goal: RH STG INCREASE KNOWLEDGE OF HYPERTENSION Description: Patient/family will be able to describe how to manage hypertension with cues/handouts Outcome: Progressing Goal: RH STG INCREASE KNOWLEDGE OF DYSPHAGIA/FLUID INTAKE Description: Patient/family will be able to describe safe diet and swallowing techniques with cues handouts Outcome: Progressing Goal: RH STG INCREASE KNOWLEDGE OF STROKE PROPHYLAXIS Description: Patient/family will be able to describe how to prevent stroke including diet and medication management with cues/handouts Outcome: Progressing   Problem: RH KNOWLEDGE DEFICIT Goal: RH STG INCREASE KNOWLEDGE OF HYPERTENSION Description: Patient/family will be able to describe how to manage hypertension with cues/handouts Outcome: Progressing Goal: RH STG INCREASE KNOWLEDGE OF DYSPHAGIA/FLUID INTAKE Description: Patient/family will be able to describe safe diet and swallowing techniques with cues handouts Outcome: Progressing Goal: RH STG INCREASE KNOWLEDGE OF STROKE PROPHYLAXIS Description: Patient/family will be able  to describe how to prevent stroke including diet and medication management with cues/handouts Outcome: Progressing

## 2020-01-29 NOTE — Progress Notes (Signed)
[place] PHYSICAL MEDICINE & REHABILITATION PROGRESS NOTE   Subjective/Complaints:  No complaints over night. Needed to use bathroom when I came in room  ROS: Patient denies fever, rash, sore throat, blurred vision, nausea, vomiting, diarrhea, cough, shortness of breath or chest pain, joint or back pain, headache, or mood change.   Objective:   No results found. No results for input(s): WBC, HGB, HCT, PLT in the last 72 hours. No results for input(s): NA, K, CL, CO2, GLUCOSE, BUN, CREATININE, CALCIUM in the last 72 hours.  Intake/Output Summary (Last 24 hours) at 01/29/2020 1154 Last data filed at 01/29/2020 0926 Gross per 24 hour  Intake 348 ml  Output --  Net 348 ml     Physical Exam: Vital Signs Blood pressure (!) 147/68, pulse 67, temperature 98.9 F (37.2 C), resp. rate 16, weight 91 kg, SpO2 99 %.  Constitutional: No distress . Vital signs reviewed. HEENT: EOMI, oral membranes moist Neck: supple Cardiovascular: RRR without murmur. No JVD    Respiratory/Chest: CTA Bilaterally without wheezes or rales. Normal effort    GI/Abdomen: BS +, non-tender, non-distended Ext: no clubbing, cyanosis, or edema Psych: pleasant and cooperative Neurologic: Cranial nerves II through XII intact, motor strength is 5/5 in right and trace 2- deltoid, bicep, tricep, grip, hip flexor, knee extensors, ankle dorsiflexor and plantar flexor/ left inattention  Musculoskeletal: Full range of motion in all 4 extremities. No joint swelling, no pain to palpation except mild / minimal Left calf tenderness   Assessment/Plan: 1. 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  which require 3+ hours per day of interdisciplinary therapy in a comprehensive inpatient rehab setting.  Physiatrist is providing close team supervision and 24 hour management of active medical problems listed below.  Physiatrist and rehab team continue to assess barriers to discharge/monitor patient progress toward  functional and medical goals  Care Tool:  Bathing    Body parts bathed by patient: Left arm, Chest, Abdomen, Front perineal area, Right upper leg, Left upper leg   Body parts bathed by helper: Buttocks, Right arm, Right lower leg, Left lower leg, Face     Bathing assist Assist Level: Moderate Assistance - Patient 50 - 74%     Upper Body Dressing/Undressing Upper body dressing   What is the patient wearing?: Dress    Upper body assist Assist Level: Maximal Assistance - Patient 25 - 49%    Lower Body Dressing/Undressing Lower body dressing      What is the patient wearing?: Incontinence brief, Pants     Lower body assist Assist for lower body dressing: Total Assistance - Patient < 25%     Toileting Toileting    Toileting assist Assist for toileting: 2 Helpers     Transfers Chair/bed transfer  Transfers assist     Chair/bed transfer assist level: Maximal Assistance - Patient 25 - 49%     Locomotion Ambulation   Ambulation assist   Ambulation activity did not occur: Safety/medical concerns  Assist level: Total Assistance - Patient < 25% Assistive device:  (wall rail) Max distance: 3 steps   Walk 10 feet activity   Assist  Walk 10 feet activity did not occur: Safety/medical concerns        Walk 50 feet activity   Assist Walk 50 feet with 2 turns activity did not occur: Safety/medical concerns         Walk 150 feet activity   Assist Walk 150 feet activity did not occur: Safety/medical concerns  Walk 10 feet on uneven surface  activity   Assist Walk 10 feet on uneven surfaces activity did not occur: Safety/medical concerns         Wheelchair     Assist Will patient use wheelchair at discharge?: Yes Type of Wheelchair: Manual    Wheelchair assist level: Dependent - Patient 0% Max wheelchair distance: 150    Wheelchair 50 feet with 2 turns activity    Assist        Assist Level: Dependent - Patient 0%    Wheelchair 150 feet activity     Assist      Assist Level: Dependent - Patient 0%   Blood pressure (!) 147/68, pulse 67, temperature 98.9 F (37.2 C), resp. rate 16, weight 91 kg, SpO2 99 %.  Medical Problem List and Plan: 1. Left hemiparesis with right gaze preference, has difficulty tracking beyond midline to the left and continues to have limited verbal output secondary to large right MCA infarct.   Continue CIR, PT, OT, SLP  SNF  Pending FL-2 completed   PRAFO/WHO qhs 2. Antithrombotics: -DVT/anticoagulation: Pharmaceutical: Lovenox -antiplatelet therapy: On ASA/Plavix 3. Pain Management: Tylenol prn. Sports creme Left calf tenderness   C spine xray unremarkable  4. Mood: LCSW to follow for evaluation and support.  -antipsychotic agents: N/A 5. Neuropsych: This patient is not fully capable of making decisions on his own behalf. 6. Skin/Wound Care: Routine pressure relief measures 7. Fluids/Electrolytes/Nutrition: Monitor I/Os. Monitor I's and O's  8. HTN: Monitor BP tid--Imdur, lisinopril, cardizem and amlodipine has been on hold.  Cardizem 30 mg qid  Vitals:   01/28/20 2315 01/29/20 0353  BP: 116/69 (!) 147/68  Pulse: 64 67  Resp:  16  Temp:  98.9 F (37.2 C)  SpO2:  46%   9/62 systolic elevation resumed low dose lisinopril   -observe for trend 9. UTI: Resolved  10. COPD: Continue Pulmicort nebs bid. 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 To contact Dr. Katherina Right closer to discharge.  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  7/13: Upgrade to D2 thins based on swallow study 13.  Sleep disturbance?: Will monitor sleep wake cycle. Was on ambien and trazodone PTA? Ambien prn for now.   Improving overall  14. Azotemia due to dysphagia/elevated BUN   15. Hyperglycemia?  Resolved 16. Constipation: Resolved, hold on further PRN laxative 17.  Poor initiation methylphenidate  increased to 10mg  monitor effect   LOS: 21 days A FACE TO FACE EVALUATION WAS PERFORMED  Meredith Staggers 01/29/2020, 11:54 AM

## 2020-01-29 NOTE — Progress Notes (Signed)
Physical Therapy Session Note  Patient Details  Name: Anne [name] MRN: [id number] Date of Birth: Aug 19, 1960  Today's Date: 01/29/2020 PT Individual Time: 0905-1000 PT Individual Time Calculation (min): 55 min   Short Term Goals: Week 3:  PT Short Term Goal 1 (Week 3): Pt will perform WC mobility x 10f with min assist PT Short Term Goal 2 (Week 3): [facility] and [facility] PT Short Term Goal 3 (Week 3): Pt will maintain sitting balance EOB with supervision assist up to 5 min  Skilled Therapeutic Interventions/Progress Updates:   Pt received supine in bed with NT completing hygiene following BM. Rolling R and L to don brief with mod assist to the L and max assist to the R. Supine>sit with mod assist and max cues to initiate movement and proper UE support on rail as needed. Sit<>stand from EOB with mod assist and increased time to initiate to pull pants to waist. PT assisted pt to don bra and shirt with total A using hemi technique. Stand [facility] L with PT advancing and stabilizing the LLE to prevent LOB to the L. [facility] transfer to mat table with LUE over therapist shoulder and max assist to prevent buckling in the LLE.   Sitting balance/core NMR to perform cross body reach to obtain horse shoe then place on basketball gaol rim x 8 with max cues for initiation of movement, improve visual scanning, and stability of LUE to improve WB and core control on the L. Sit<>stand from mat table with max assist due to poor initiation. Lateral reach to the R while in standing to improve R weight shifing to obtain horse shoe, then place on table at midline. Use of visual feedback from mirror to improve midline in standing.   Stand [facility] assist and increased time, as pt asked to verbalize motor plan to perform transfer. Pt unable to verbalize more than 1 step at a time, and  required PT to block the L knee in stance for transfer.   [facility] returned to room and [facility] and all needs met.         Therapy Documentation Precautions:  Precautions Precautions: Other (comment) Precaution Comments: L hemi Restrictions Weight Bearing Restrictions: No Pain: Pain Assessment Pain Scale: Faces Pain Score: 0-No pain denies     Therapy/Group: Individual Therapy  ALorie Phenix7/16/2021, 10:07 AM

## 2020-01-29 NOTE — Progress Notes (Signed)
Patient ID: Anne [name], female   DOB: [birth year], 59 y.o.   MRN: [id number]   Sw contacted patient new case worker and left a voicemail: [name] [phone]

## 2020-01-29 NOTE — Progress Notes (Signed)
Occupational Therapy Session Note  Patient Details  Name: Anne [name] MRN: [id number] Date of Birth: 01-08-1961  Today's Date: 01/29/2020 OT Individual Time: 9629-5284 and  1345-1450 OT Individual Time Calculation (min): 45 min and 65 min   Short Term Goals: Week 1:  OT Short Term Goal 1 (Week 1): Pt will complete sit<>stand for ADL with max A. OT Short Term Goal 1 - Progress (Week 1): Not met OT Short Term Goal 2 (Week 1): Pt will locate 3 self-care items on L side with mod cues. OT Short Term Goal 2 - Progress (Week 1): Not met OT Short Term Goal 3 (Week 1): Pt will don shirt with mod A using hemi dressing techniques. OT Short Term Goal 3 - Progress (Week 1): Not met OT Short Term Goal 4 (Week 1): Pt will demonstrate static sitting balance for 30+ seconds with CGA. OT Short Term Goal 4 - Progress (Week 1): Met Week 2:  OT Short Term Goal 1 (Week 2): Pt will transfer onto commode with assist of 1 person. OT Short Term Goal 1 - Progress (Week 2): Not met OT Short Term Goal 2 (Week 2): Pt will don UB clothing with mod A overall. OT Short Term Goal 2 - Progress (Week 2): Met OT Short Term Goal 3 (Week 2): Pt will locate 3 self care items to the L with mod cuing. OT Short Term Goal 3 - Progress (Week 2): Met Week 3:  OT Short Term Goal 1 (Week 3): STGs=LTGs secondary to upcoming discharge  Skilled Therapeutic Interventions/Progress Updates:    Visit 1: No c/o pain Pt received in bed with her daughter working on combing out her hair mats and braiding her hair.  Had pt sit to EOB with mod A and then she held her static sit with close S while her dtr continued to work on her hair.  Pt shifts her wt to her L hip and sits in an assymmetrical position, so positioned a pillow under her L hip for support. While dtr was working on her hair, had pt engage in a/arom of LUE with MAX cues to attend to L hand and to keep her attention on her hand. Pt has a strong R gaze preference. Pt was then  transferred to wc with mod A squat pivot (+2 for safety) but her bed was wet. Used stedy to transfer her back to bed. +2 max A to stand from wc to stedy.  In stedy, pt leaning to the Left. Once in bed, RN and OT cleansed and changed pt's clothing.  Pt left in bed in a sitting position to prepare for lunch.   Bed alarm set.   Visit 2: No c/o pain Pt in bed sleeping but did awaken with cues. LUE PROM with sustained stretching to maintain joint integrity due to high tone. Worked on B hands clasped with reaching chest left to ceiling for B integration. Pt needed max cues to stay engaged.  Pt then rolled to L side with min A and sat to EOB with max A.  Got pt set up in stedy and then RN arrived to assist with the +2 transfer bed to wc with steady.  Pt then completed oral care at sink with cues and set up.  Pt then taken to gym to attempt sit to stands at parallel bars. +2 A with therapist in front of pt supporting her L knee.  MAX cues to lean forward to rise to stand. Once pt started movement with  max A ,she could then rise to stand with min -mod a and held stand for 15-20 seconds.she would then sit suddenly. She stood 2x but demonstrated poor initiation with movement patterns.   Pt then stated she needed to use the bathroom. Taken back to room and set up [facility].  Attempted 5x to have pt stand to stedy, but pt did not initiate any movement. She did not try to push up with her legs at all despite strong encouragement. It was not safe to try to stand her with TOTAL A.  Instead, transferred to bed with total A squat pivot of 2. Pt placed on bed pan and nursing contacted to let them know pt was on bed pan.  Call light on, bed alarm on.   Therapy Documentation Precautions:  Precautions Precautions: Other (comment) Precaution Comments: L hemi Restrictions Weight Bearing Restrictions: No      Therapy/Group: Individual Therapy  [place] 01/29/2020, 1:03 PM

## 2020-01-30 NOTE — Progress Notes (Signed)
[place] PHYSICAL MEDICINE & REHABILITATION PROGRESS NOTE   Subjective/Complaints:  No complaints today  ROS: Patient denies fever, rash, sore throat, blurred vision, nausea, vomiting, diarrhea, cough, shortness of breath or chest pain, joint or back pain, headache, or mood change.     Objective:   No results found. No results for input(s): WBC, HGB, HCT, PLT in the last 72 hours. No results for input(s): NA, K, CL, CO2, GLUCOSE, BUN, CREATININE, CALCIUM in the last 72 hours.  Intake/Output Summary (Last 24 hours) at 01/30/2020 1125 Last data filed at 01/30/2020 0823 Gross per 24 hour  Intake 60 ml  Output --  Net 60 ml     Physical Exam: Vital Signs Blood pressure 124/69, pulse 95, temperature 98.4 F (36.9 C), temperature source Oral, resp. rate 16, weight 91 kg, SpO2 100 %.  Constitutional: No distress . Vital signs reviewed. HEENT: EOMI, oral membranes moist Neck: supple Cardiovascular: RRR without murmur. No JVD    Respiratory/Chest: CTA Bilaterally without wheezes or rales. Normal effort    GI/Abdomen: BS +, non-tender, non-distended Ext: no clubbing, cyanosis, or edema Psych: pleasant and cooperative Neurologic: Cranial nerves II through XII intact, motor strength is 5/5 in right and trace 2- deltoid, bicep, tricep, grip, hip flexor, knee extensors, ankle dorsiflexor and plantar flexor/ left inattention  Musculoskeletal: Full range of motion in all 4 extremities. No joint swelling, no pain to palpation  minimal Left calf tenderness   Assessment/Plan: 1. 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  which require 3+ hours per day of interdisciplinary therapy in a comprehensive inpatient rehab setting.  Physiatrist is providing close team supervision and 24 hour management of active medical problems listed below.  Physiatrist and rehab team continue to assess barriers to discharge/monitor patient progress toward functional and medical goals  Care  Tool:  Bathing    Body parts bathed by patient: Left arm, Chest, Abdomen, Front perineal area, Right upper leg, Left upper leg   Body parts bathed by helper: Buttocks, Right arm, Right lower leg, Left lower leg, Face     Bathing assist Assist Level: Moderate Assistance - Patient 50 - 74%     Upper Body Dressing/Undressing Upper body dressing   What is the patient wearing?: Dress    Upper body assist Assist Level: Maximal Assistance - Patient 25 - 49%    Lower Body Dressing/Undressing Lower body dressing      What is the patient wearing?: Incontinence brief, Pants     Lower body assist Assist for lower body dressing: Total Assistance - Patient < 25%     Toileting Toileting    Toileting assist Assist for toileting: 2 Helpers     Transfers Chair/bed transfer  Transfers assist     Chair/bed transfer assist level: Maximal Assistance - Patient 25 - 49%     Locomotion Ambulation   Ambulation assist   Ambulation activity did not occur: Safety/medical concerns  Assist level: Total Assistance - Patient < 25% Assistive device:  (wall rail) Max distance: 3 steps   Walk 10 feet activity   Assist  Walk 10 feet activity did not occur: Safety/medical concerns        Walk 50 feet activity   Assist Walk 50 feet with 2 turns activity did not occur: Safety/medical concerns         Walk 150 feet activity   Assist Walk 150 feet activity did not occur: Safety/medical concerns         Walk 10 feet on  uneven surface  activity   Assist Walk 10 feet on uneven surfaces activity did not occur: Safety/medical concerns         Wheelchair     Assist Will patient use wheelchair at discharge?: Yes Type of Wheelchair: Manual    Wheelchair assist level: Dependent - Patient 0% Max wheelchair distance: 150    Wheelchair 50 feet with 2 turns activity    Assist        Assist Level: Dependent - Patient 0%   Wheelchair 150 feet activity      Assist      Assist Level: Dependent - Patient 0%   Blood pressure 124/69, pulse 95, temperature 98.4 F (36.9 C), temperature source Oral, resp. rate 16, weight 91 kg, SpO2 100 %.  Medical Problem List and Plan: 1. Left hemiparesis with right gaze preference, has difficulty tracking beyond midline to the left and continues to have limited verbal output secondary to large right MCA infarct.   Continue CIR, PT, OT, SLP  SNF  Pending FL-2 completed   PRAFO/WHO qhs 2. Antithrombotics: -DVT/anticoagulation: Pharmaceutical: Lovenox -antiplatelet therapy: On ASA/Plavix 3. Pain Management: Tylenol prn. Sports creme Left calf tenderness   C spine xray unremarkable  4. Mood: LCSW to follow for evaluation and support.  -antipsychotic agents: N/A 5. Neuropsych: This patient is not fully capable of making decisions on his own behalf. 6. Skin/Wound Care: Routine pressure relief measures 7. Fluids/Electrolytes/Nutrition: Monitor I/Os. Monitor I's and O's  8. HTN: Monitor BP tid--Imdur, lisinopril, cardizem and amlodipine has been on hold.  Cardizem 30 mg qid  Vitals:   01/29/20 2308 01/30/20 0535  BP: (!) 142/80 124/69  Pulse: 87 95  Resp:  16  Temp:  98.4 F (36.9 C)  SpO2:  100%   7/17 improved bp with low dose lisinopril on board 9. UTI: Resolved  10. COPD: Continue Pulmicort nebs bid. 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 To contact Dr. Katherina Right closer to discharge.  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  7/13: Upgrade to D2 thins based on swallow study 13.  Sleep disturbance?: Will monitor sleep wake cycle. Was on ambien and trazodone PTA? Ambien prn for now.   Improving overall  14. Azotemia due to dysphagia/elevated BUN   15. Hyperglycemia?  Resolved 16. Constipation: Resolved, hold on further PRN laxative 17.  Poor initiation methylphenidate increased to 10mg  monitor effect    LOS: 22 days A FACE TO FACE EVALUATION WAS PERFORMED  Meredith Staggers 01/30/2020, 11:25 AM

## 2020-01-30 NOTE — Progress Notes (Signed)
Occupational Therapy Session Note  Patient Details  Name: Anne [name] MRN: [id number] Date of Birth: Dec 11, 1960  Today's Date: 01/30/2020 OT Individual Time: 1500-1530 OT Individual Time Calculation (min): 30 min    Short Term Goals: Week 3:  OT Short Term Goal 1 (Week 3): STGs=LTGs secondary to upcoming discharge  Skilled Therapeutic Interventions/Progress Updates:    Pt received in bed agreeable to therapy.  Worked on a/arom exercises for LUE/LLE to facilitate active movement.  PROM to LUE as she had increased tone in shoulder and elbow.  Worked on bed mobility of bridging hips, rotating knees side to side for trunk rotation.  Pt stated those stretching movements felt good to her and she responded well to them.  Pt answering questions with a delayed response but slightly more verbal and engaged today.   Pt continuing to rest in bed with all needs met and bed alarm set.   Therapy Documentation Precautions:  Precautions Precautions: Other (comment) Precaution Comments: L hemi Restrictions Weight Bearing Restrictions: No Vital Signs: Therapy Vitals Temp: 99.5 F (37.5 C) Temp Source: Oral Pulse Rate: 71 Resp: 18 BP: 118/65 Patient Position (if appropriate): Lying Oxygen Therapy SpO2: 99 % O2 Device: Room Air Pain: Pain Assessment Pain Score: 0-No pain   Therapy/Group: Individual Therapy  [place] 01/30/2020, 4:35 PM

## 2020-01-30 NOTE — Plan of Care (Signed)
  Problem: Consults Goal: RH STROKE PATIENT EDUCATION Description: See Patient Education module for education specifics  Outcome: Progressing   Problem: RH BOWEL ELIMINATION Goal: RH STG MANAGE BOWEL WITH ASSISTANCE Description: STG Manage Bowel with  [place]. Outcome: Progressing   Problem: RH BLADDER ELIMINATION Goal: RH STG MANAGE BLADDER WITH ASSISTANCE Description: STG Manage Bladder With  Min Assistance  Outcome: Progressing   Problem: RH SKIN INTEGRITY Goal: RH STG SKIN FREE OF INFECTION/BREAKDOWN Description: Free of breakdown with min assist Outcome: Progressing   Problem: RH SAFETY Goal: RH STG ADHERE TO SAFETY PRECAUTIONS W/ASSISTANCE/DEVICE Description: STG Adhere to Safety Precautions With Min Assistance/Device.  Outcome: Progressing   Problem: RH COGNITION-NURSING Goal: RH STG USES MEMORY AIDS/STRATEGIES W/ASSIST TO PROBLEM SOLVE Description: STG Uses Memory Aids/Strategies With Min Assistance to Problem Solve.  Outcome: Progressing   Problem: RH KNOWLEDGE DEFICIT Goal: RH STG INCREASE KNOWLEDGE OF HYPERTENSION Description: Patient/family will be able to describe how to manage hypertension with cues/handouts Outcome: Progressing Goal: RH STG INCREASE KNOWLEDGE OF DYSPHAGIA/FLUID INTAKE Description: Patient/family will be able to describe safe diet and swallowing techniques with cues handouts Outcome: Progressing Goal: RH STG INCREASE KNOWLEDGE OF STROKE PROPHYLAXIS Description: Patient/family will be able to describe how to prevent stroke including diet and medication management with cues/handouts Outcome: Progressing

## 2020-02-01 ENCOUNTER — Inpatient Hospital Stay (HOSPITAL_COMMUNITY): Payer: Medicaid Other

## 2020-02-01 ENCOUNTER — Inpatient Hospital Stay (HOSPITAL_COMMUNITY): Payer: Medicaid Other | Admitting: Occupational Therapy

## 2020-02-01 LAB — BASIC METABOLIC PANEL
Anion gap: 9 (ref 5–15)
BUN: 21 mg/dL — ABNORMAL HIGH (ref 6–20)
CO2: 23 mmol/L (ref 22–32)
Calcium: 10.9 mg/dL — ABNORMAL HIGH (ref 8.9–10.3)
Chloride: 103 mmol/L (ref 98–111)
Creatinine, Ser: 0.87 mg/dL (ref 0.44–1.00)
GFR calc Af Amer: >60 mL/min (ref >60)
GFR calc non Af Amer: >60 mL/min (ref >60)
Glucose, Bld: 92 mg/dL (ref 70–99)
Potassium: 4.1 mmol/L (ref 3.5–5.1)
Sodium: 135 mmol/L (ref 135–145)

## 2020-02-01 LAB — CBC
HCT: 42.4 % (ref 36.0–46.0)
Hemoglobin: 13.6 g/dL (ref 12.0–15.0)
MCH: 27.9 pg (ref 26.0–34.0)
MCHC: 32.1 g/dL (ref 30.0–36.0)
MCV: 86.9 fL (ref 80.0–100.0)
Platelets: 214 10*3/uL (ref 150–400)
RBC: 4.88 MIL/uL (ref 3.87–5.11)
RDW: 12.7 % (ref 11.5–15.5)
WBC: 8 10*3/uL (ref 4.0–10.5)
nRBC: 0 % (ref 0.0–0.2)

## 2020-02-01 NOTE — Progress Notes (Addendum)
Patient ID: Anne [name], female   DOB: 1960-10-15, 59 y.o.   MRN: [id number]   Sw followed up with patient daughter. Daughter informed sw family is willing to take patient home if they have to due to SNF denials. If unable to get SNF placement initially family willing to take home to provide care.  . SW waiting to follow up with a facility. SW informed daughter if patient discharging home some family education will need to be in place before discharge. Sw also provided daughter with some home care options.

## 2020-02-01 NOTE — Progress Notes (Signed)
Patient ID: Anne [name], female   DOB: 21-Apr-1961, 59 y.o.   MRN: [id number]   Sw followed up with [facility] on bed offer, left voicemail will wait on follow up call

## 2020-02-01 NOTE — Progress Notes (Signed)
Physical Therapy Weekly Progress Note  Patient Details  Name: Anne [name] MRN: [id number] Date of Birth: [birth year]  Beginning of progress report period: January 23, 2020 End of progress report period: February 01, 2020  Today's Date: 02/01/2020 PT Individual Time: 1100-1200 PT Individual Time Calculation (min): 60 min   Patient has met 0 of 3 short term goals. Pt has shown some progress however slow. Pt demonstrated need of increased assistance this date and reported having more pain than usual in back which limited her this date. Pt required overall more assistance for bed mobility, transfers, sitting balance and standing. Overall prior to this session, pt has demonstrated improved sitting balance at EOB, and standing transfers have improved.   Patient continues to demonstrate the following deficits muscle weakness and muscle paralysis, decreased cardiorespiratoy endurance, impaired timing and sequencing, abnormal tone, unbalanced muscle activation, motor apraxia, ataxia, decreased coordination and decreased motor planning, decreased visual perceptual skills and decreased visual motor skills, decreased midline orientation and decreased attention to left, decreased initiation, decreased attention, decreased awareness, decreased problem solving, decreased safety awareness, decreased memory and delayed processing,  and decreased sitting balance, decreased standing balance, decreased postural control, hemiplegia and decreased balance strategies and therefore will continue to benefit from skilled PT intervention to increase functional independence with mobility.  Patient progressing toward long term goals..  Long term goals adjusted to discontinue car transfer as pt discharge plans are to SNF and WC goal to 74ft at min A due to lack of progress.   PT Short Term Goals Week 1:  PT Short Term Goal 1 (Week 1): Pt will sit EOB with min assist x 5 mintues PT Short Term Goal 1 - Progress (Week 1): Met PT Short  Term Goal 2 (Week 1): Pt will initiate gait training PT Short Term Goal 2 - Progress (Week 1): Met PT Short Term Goal 3 (Week 1): Pt will transfer to and from [facility] with mod assist +2 using SB. PT Short Term Goal 3 - Progress (Week 1): Progressing toward goal PT Short Term Goal 4 (Week 1): Pt will perform bed mobility with max assist of 1 PT Short Term Goal 4 - Progress (Week 1): Met Week 2:  PT Short Term Goal 1 (Week 2): Pt will transfer to and from med with max assist of 1 consistently PT Short Term Goal 1 - Progress (Week 2): Met PT Short Term Goal 2 (Week 2): Family will begin hands on education PT Short Term Goal 2 - Progress (Week 2): Progressing toward goal PT Short Term Goal 3 (Week 2): Pt will perform WC mobility with hemi technique with mod assist x 50 ft PT Short Term Goal 3 - Progress (Week 2): Met PT Short Term Goal 4 (Week 2): Pt will perform gait training with HW x 100ft and max assist +2. PT Short Term Goal 4 - Progress (Week 2): Met Week 3:  PT Short Term Goal 1 (Week 3): Pt will perform WC mobility x 39ft with min assist PT Short Term Goal 1 - Progress (Week 3): Not met PT Short Term Goal 2 (Week 3): Pt will transfer to [facility] with mod assist and LRAD PT Short Term Goal 2 - Progress (Week 3): Not met PT Short Term Goal 3 (Week 3): Pt will maintain sitting balance EOB with supervision assist up to 5 min PT Short Term Goal 3 - Progress (Week 3): Not met Week 4:  PT Short Term Goal 1 (Week 4): Pt will maintain sitting balance  EOB with supervision assist up to 5 min PT Short Term Goal 2 (Week 4): Pt will transfer to [facility] with mod assist and LRAD PT Short Term Goal 3 (Week 4): pt to perform WC mobility for 50 ft at min A  Skilled Therapeutic Interventions/Progress Updates:      Therapy Documentation  pt received in bed and agreeable to therapy. Pt directed in supine>sit with HOB elevated, max A x1 to pt's L with VC for tech and initiation, pt demonstrated ability to mobilize BLE to  assist in transfer however LLE required greater assist compared to R to come to side; once BLE at EOB, pt able to initiate trunk to midline at mod A. Pt directed in sitting EOB for 10 mins at max A of one with pt pushing to L demonstrating poor awareness and sitting balance this date, with max VC and assist to weight bearing on R elbow with lateral trunk lean to improve midline awareness. Pt intermittently left this position to return to heavy pushing toward L and similar assist to return to elbow lean. Nursing brought to bedside to educate on patient's increased needs of assist this date. Nursing and MD made aware with MD coming to patient's room as well.  Pt directed in slide board transfer to [facility] for setup, achievement and removal of slide board. Pt directed in manual WC mobility with forward propulsion at mod A x1 for 30' x2 with manual assistance of LUE use (total) and VC and tactile cues for RUE, max VC for encouragement, technique. Pt required max A for turning in WC. Pt limited 2/2 distractibility, decreased initiation, attention to task, inattention to L side and back pain this date. Attempted to direct pt in STS from [facility] at hall rail however pt unable to demonstrate any initiation to stand or pull with RUE at rail. Pt taken back to room to transfer to bed for comfort however pt reported "yes" to slight relief with sitting in WC. Pt directed in trunk rotation to pt's L with RUE reaching for objects in room to improve attention to L side, increase visual scanning, x10 completed with max VC for attention on final x3 reaches. Pt directed in STS from [facility] with use of stedy from [facility] however pt unable to actively initiate this stance this date as well. Pt shook head no to attempting again. Pt unable to rank pain however reported to nursing, MD, and multiple times during therapy. All parties made aware.   Pt left in WC, alarm set, All needs in reach and in good condition. Call light in hand with lap tray  in place.     Precautions:  Precautions Precautions: Other (comment) Precaution Comments: L hemi Restrictions Weight Bearing Restrictions: No     Therapy/Group: Individual Therapy  Junie Panning 02/01/2020, 3:24 PM

## 2020-02-01 NOTE — Progress Notes (Signed)
Occupational Therapy Session Note  Patient Details  Name: Isabellamarie Randa MRN: [id number] Date of Birth: 15-Jun-1961  Today's Date: 02/01/2020 OT Individual Time: 9563-8756 OT Individual Time Calculation (min): 75 min    Short Term Goals: Week 1:  OT Short Term Goal 1 (Week 1): Pt will complete sit<>stand for ADL with max A. OT Short Term Goal 1 - Progress (Week 1): Not met OT Short Term Goal 2 (Week 1): Pt will locate 3 self-care items on L side with mod cues. OT Short Term Goal 2 - Progress (Week 1): Not met OT Short Term Goal 3 (Week 1): Pt will don shirt with mod A using hemi dressing techniques. OT Short Term Goal 3 - Progress (Week 1): Not met OT Short Term Goal 4 (Week 1): Pt will demonstrate static sitting balance for 30+ seconds with CGA. OT Short Term Goal 4 - Progress (Week 1): Met Week 2:  OT Short Term Goal 1 (Week 2): Pt will transfer onto commode with assist of 1 person. OT Short Term Goal 1 - Progress (Week 2): Not met OT Short Term Goal 2 (Week 2): Pt will don UB clothing with mod A overall. OT Short Term Goal 2 - Progress (Week 2): Met OT Short Term Goal 3 (Week 2): Pt will locate 3 self care items to the L with mod cuing. OT Short Term Goal 3 - Progress (Week 2): Met Week 3:  OT Short Term Goal 1 (Week 3): STGs=LTGs secondary to upcoming discharge   LTGS OF TOILETING AND TOILET TRANSFERS DISCONTINUED AS IT IS NOT SAFE FOR PATIENT TO SIT ON BSC OR TOILET DUE TO SEVERELY IMPAIRED BALANCE.  Skilled Therapeutic Interventions/Progress Updates:    Pt received in bed, awake.  Due to history of pt having decreased motivation these last few weeks, decided to try to encourage her with a shower.  (pt does not have a shower goal and is a night bath).  Pt agreeable and donned shower cap in preparation. Pt sat to EOB with max A and she was noted to be in a soaking wet brief with a wet bed. With +2 A, pt stood in stedy with TOTAL A.  No initiation or physical effort on pt's part to A  despite numerous cues and encouragement.   Once in stedy, pt leaning severely to her Left.  Tried multiple cues, tactile A to have pt sit upright but she kept leaning further and further to her Left.  It was not safe to transfer her to the tub bench or have her sit on the bench.  Pt then sat in w/c while bed linens were being changed but she continued to lean L.  Donned UB clothing max A and max cues. It then took a 3rd person to A moving her from sit to stand in w/c to stedy as pt not initiating at all.    Pt moved back to supine and from there with +2 A, pt cleansed and changed. During this process she had a small bowel movement.  Pt had new brief and pants donned.  Pt then sat upright in bed and worked on positioning pt in bed to sit in midline.  PROM to LUE to maintain joint integrity.  Wrist support splint donned and PRAFO based on pt's request.   Pt in bed with all needs met and bed alarm set.   Therapy Documentation Precautions:  Precautions Precautions: Other (comment) Precaution Comments: L hemi Restrictions Weight Bearing Restrictions: No Pain: Pain Assessment Pain  Scale: 0-10 Pain Score: 0-No pain Faces Pain Scale: Hurts even more Pain Location: Hip Pain Orientation: Right Pain Descriptors / Indicators: Aching Pain Onset: With Activity Pain Intervention(s): Repositioned  Therapy/Group: Individual Therapy  Anza 02/01/2020, 12:26 PM

## 2020-02-01 NOTE — Progress Notes (Signed)
PHYSICAL MEDICINE & REHABILITATION PROGRESS NOTE   Subjective/Complaints:  Pt states nurse is out of sportscreme  ROS: Patient denies CP, SOB, N/V/D    Objective:   No results found. Recent Labs    02/01/20 0640  WBC 8.0  HGB 13.6  HCT 42.4  PLT 214   Recent Labs    02/01/20 0640  NA 135  K 4.1  CL 103  CO2 23  GLUCOSE 92  BUN 21*  CREATININE 0.87  CALCIUM 10.9*    Intake/Output Summary (Last 24 hours) at 02/01/2020 1033 Last data filed at 02/01/2020 0900 Gross per 24 hour  Intake 300 ml  Output --  Net 300 ml     Physical Exam: Vital Signs Blood pressure (!) 141/73, pulse 79, temperature 99.6 F (37.6 C), temperature source Oral, resp. rate 16, weight 91 kg, SpO2 99 %.   General: No acute distress Mood and affect are appropriate Heart: Regular rate and rhythm no rubs murmurs or extra sounds Lungs: Clear to auscultation, breathing unlabored, no rales or wheezes Abdomen: Positive bowel sounds, soft nontender to palpation, nondistended Extremities: No clubbing, cyanosis, or edema Skin: No evidence of breakdown, no evidence of rash  Neurologic: Cranial nerves II through XII intact, motor strength is 5/5 in right and trace 2- deltoid, bicep, tricep, grip, hip flexor, knee extensors, ankle dorsiflexor and plantar flexor/ left inattention  Musculoskeletal: Full range of motion in all 4 extremities. No joint swelling, no pain to palpation  minimal Left calf tenderness   Assessment/Plan: 1. 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  which require 3+ hours per day of interdisciplinary therapy in a comprehensive inpatient rehab setting.  Physiatrist is providing close team supervision and 24 hour management of active medical problems listed below.  Physiatrist and rehab team continue to assess barriers to discharge/monitor patient progress toward functional and medical goals  Care Tool:  Bathing    Body parts bathed by patient: Left arm,  Chest, Abdomen, Front perineal area, Right upper leg, Left upper leg   Body parts bathed by helper: Buttocks, Right arm, Right lower leg, Left lower leg, Face     Bathing assist Assist Level: Moderate Assistance - Patient 50 - 74%     Upper Body Dressing/Undressing Upper body dressing   What is the patient wearing?: Dress    Upper body assist Assist Level: Maximal Assistance - Patient 25 - 49%    Lower Body Dressing/Undressing Lower body dressing      What is the patient wearing?: Incontinence brief, Pants     Lower body assist Assist for lower body dressing: Total Assistance - Patient < 25%     Toileting Toileting    Toileting assist Assist for toileting: 2 Helpers     Transfers Chair/bed transfer  Transfers assist     Chair/bed transfer assist level: Maximal Assistance - Patient 25 - 49%     Locomotion Ambulation   Ambulation assist   Ambulation activity did not occur: Safety/medical concerns  Assist level: Total Assistance - Patient < 25% Assistive device:  (wall rail) Max distance: 3 steps   Walk 10 feet activity   Assist  Walk 10 feet activity did not occur: Safety/medical concerns        Walk 50 feet activity   Assist Walk 50 feet with 2 turns activity did not occur: Safety/medical concerns         Walk 150 feet activity   Assist Walk 150 feet activity did not occur: Safety/medical  concerns         Walk 10 feet on uneven surface  activity   Assist Walk 10 feet on uneven surfaces activity did not occur: Safety/medical concerns         Wheelchair     Assist Will patient use wheelchair at discharge?: Yes Type of Wheelchair: Manual    Wheelchair assist level: Dependent - Patient 0% Max wheelchair distance: 150    Wheelchair 50 feet with 2 turns activity    Assist        Assist Level: Dependent - Patient 0%   Wheelchair 150 feet activity     Assist      Assist Level: Dependent - Patient 0%    Blood pressure (!) 141/73, pulse 79, temperature 99.6 F (37.6 C), temperature source Oral, resp. rate 16, weight 91 kg, SpO2 99 %.  Medical Problem List and Plan: 1. Left hemiparesis with right gaze preference, has difficulty tracking beyond midline to the left and continues to have limited verbal output secondary to large right MCA infarct.   Continue CIR, PT, OT, SLP  SNF  Pending FL-2 completed   PRAFO/WHO qhs 2. Antithrombotics: -DVT/anticoagulation: Pharmaceutical: Lovenox -antiplatelet therapy: On ASA/Plavix 3. Pain Management: Tylenol prn. Sports creme Left calf tenderness as well as Right LE pain  C spine xray unremarkable  4. Mood: LCSW to follow for evaluation and support.  -antipsychotic agents: N/A 5. Neuropsych: This patient is not fully capable of making decisions on his own behalf. 6. Skin/Wound Care: Routine pressure relief measures 7. Fluids/Electrolytes/Nutrition: Monitor I/Os. Monitor I's and O's  8. HTN: Monitor BP tid--Imdur, lisinopril, cardizem and amlodipine has been on hold.  Cardizem 30 mg qid  Vitals:   02/01/20 0000 02/01/20 0335  BP: (!) 145/63 (!) 141/73  Pulse: 75 79  Resp:  16  Temp:  99.6 F (37.6 C)  SpO2:  99%   7/19 improved bp with low dose lisinopril on board 9. UTI: Resolved  10. COPD: Continue Pulmicort nebs bid. 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 To contact Dr. Katherina Right closer to discharge.  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  7/13: Upgrade to D2 thins based on swallow study 13.  Sleep disturbance?: Will monitor sleep wake cycle. Was on ambien and trazodone PTA? Ambien prn for now.   Improving overall  14. Azotemia due to dysphagia/elevated BUN   15. Hyperglycemia?  Resolved 16. Constipation: Resolved, hold on further PRN laxative 17.  Poor initiation methylphenidate increased to 10mg  monitor effect   LOS: 24 days A FACE TO FACE  EVALUATION WAS PERFORMED  Charlett Blake 02/01/2020, 10:33 AM

## 2020-02-01 NOTE — Plan of Care (Signed)
  Problem: Consults Goal: RH STROKE PATIENT EDUCATION Description: See Patient Education module for education specifics  Outcome: Progressing   Problem: RH BOWEL ELIMINATION Goal: RH STG MANAGE BOWEL WITH ASSISTANCE Description: STG Manage Bowel with  [place]. Outcome: Progressing   Problem: RH BLADDER ELIMINATION Goal: RH STG MANAGE BLADDER WITH ASSISTANCE Description: STG Manage Bladder With  Min Assistance  Outcome: Progressing   Problem: RH SKIN INTEGRITY Goal: RH STG SKIN FREE OF INFECTION/BREAKDOWN Description: Free of breakdown with min assist Outcome: Progressing   Problem: RH SAFETY Goal: RH STG ADHERE TO SAFETY PRECAUTIONS W/ASSISTANCE/DEVICE Description: STG Adhere to Safety Precautions With Min Assistance/Device.  Outcome: Progressing   Problem: RH COGNITION-NURSING Goal: RH STG USES MEMORY AIDS/STRATEGIES W/ASSIST TO PROBLEM SOLVE Description: STG Uses Memory Aids/Strategies With Min Assistance to Problem Solve.  Outcome: Progressing   Problem: RH KNOWLEDGE DEFICIT Goal: RH STG INCREASE KNOWLEDGE OF HYPERTENSION Description: Patient/family will be able to describe how to manage hypertension with cues/handouts Outcome: Progressing Goal: RH STG INCREASE KNOWLEDGE OF DYSPHAGIA/FLUID INTAKE Description: Patient/family will be able to describe safe diet and swallowing techniques with cues handouts Outcome: Progressing Goal: RH STG INCREASE KNOWLEDGE OF STROKE PROPHYLAXIS Description: Patient/family will be able to describe how to prevent stroke including diet and medication management with cues/handouts Outcome: Progressing

## 2020-02-01 NOTE — Plan of Care (Signed)
  Problem: RH Car Transfers Goal: LTG Patient will perform car transfers with assist (PT) Description: LTG: Patient will perform car transfers with assistance (PT). Outcome: Not Applicable Flowsheets (Taken 02/01/2020 1500) LTG: Pt will perform car transfers with assist:: (D/c due to SNF) -- Note: D/C due to SNF   Problem: RH Wheelchair Mobility Goal: LTG Patient will propel w/c in controlled environment (PT) Description: LTG: Patient will propel wheelchair in controlled environment, # of feet with assist (PT) Flowsheets (Taken 02/01/2020 1500) LTG: Pt will propel w/c in controlled environ  assist needed:: Minimal Assistance - Patient > 75% LTG: Propel w/c distance in controlled environment: (downgraded due to lack of progress) 50' Note: Downgraded due to lack of progress

## 2020-02-01 NOTE — Progress Notes (Signed)
Patient ID: Anne [name], female   DOB: 02/21/1961, 59 y.o.   MRN: [id number]   Sw followed up with Jackelyn Poling at [facility]. She will be sending patient information to their cooperate office. If patient is unvaccinated, facility will be unable to make bed offer until August. If vaccinated, facility will be able to make offer.

## 2020-02-01 NOTE — Progress Notes (Signed)
Patient ID: Anne [name], female   DOB: [birth year], 59 y.o.   MRN: [id number]   Case Worker Charlean Merl Roanna Raider) returned call to SW. Patient daughter will need to reapply for Medicaid. Case worker informed daughter that she can apply online, in person or have application mailed to address. SW informed daughter that we can print a application for her to drop off.

## 2020-02-01 NOTE — Progress Notes (Signed)
Patient ID: Anne [name], female   DOB: [birth year], 59 y.o.   MRN: [id number]   SW followed up with daughter on bed offers. Daughter would like to inquire if [place] and [place] are still willing to make bed offers, SW will follow up. If unable to make bed offers daughter will like her to discharge home for family to provide care inside of the home.

## 2020-02-01 NOTE — Progress Notes (Signed)
Patient ID: Anne [name], female   DOB: 1961-03-18, 59 y.o.   MRN: [id number]   Sw followed up with patient. No question or concerns at the moment

## 2020-02-01 NOTE — Progress Notes (Signed)
Speech Language Pathology Daily Session Note  Patient Details  Name: Anne [name] MRN: [id number] Date of Birth: 11/23/1960  Today's Date: 02/01/2020 SLP Individual Time: 1300-1359 SLP Individual Time Calculation (min): 59 min  Short Term Goals: Week 4: SLP Short Term Goal 1 (Week 4): STG=LTG due to remaining LOS  Skilled Therapeutic Interventions: Skilled ST services focused on swallow and cognitive skills. SLP facilitated PO consumption of dys 3 texture trial lunch tray. Pt demonstrated at times moderate oral residue and left buccal pocketing, but was able to clear oral cavity with subsequent swallows and use of liquid wash with mod A verbal cues. SLP upgraded solids to dys 3 textures with continued full supervision for oral clearance. SLP facilitated basic problem solving and sustained attention skills in card sorting and sequencing tasks. Pt was able to return demonstrate of sequencing 3 picture cards in 1 out 3 trials given max A verbal cues. Pt demonstrated improved problem solving when sorting cards by 3 then 4 colors with mod A verbal cues fading to supervision A verbal cues. Pt demonstrated sustained attention in 5 minute intervals and then in 10 minute intervals with mod A verbal cues for redirections with islands of inattention/no verbal response. Pt was left in room with call bell within reach and chair alarm set. ST recommends to continue skilled ST services.      Pain Pain Assessment Pain Score: 0-No pain  Therapy/Group: Individual Therapy  Ryane Canavan  [facility] 02/01/2020, 4:49 PM

## 2020-02-02 ENCOUNTER — Inpatient Hospital Stay (HOSPITAL_COMMUNITY): Payer: Medicaid Other | Admitting: Physical Therapy

## 2020-02-02 ENCOUNTER — Inpatient Hospital Stay (HOSPITAL_COMMUNITY): Payer: Medicaid Other | Admitting: *Deleted

## 2020-02-02 ENCOUNTER — Inpatient Hospital Stay (HOSPITAL_COMMUNITY): Payer: Medicaid Other | Admitting: Occupational Therapy

## 2020-02-02 MED ORDER — ESCITALOPRAM OXALATE 10 MG PO TABS
5.0000 mg | ORAL_TABLET | Freq: Every day | ORAL | Status: DC
  Administered 2020-02-02 – 2020-02-10 (×9): 5 mg via ORAL
  Filled 2020-02-02 (×9): qty 1

## 2020-02-02 NOTE — Progress Notes (Signed)
Occupational Therapy Session Note  Patient Details  Name: Anne [name] MRN: [id number] Date of Birth: 01/13/1961  Today's Date: 02/02/2020 OT Individual Time: 1515-1600 OT Individual Time Calculation (min): 45 min    Short Term Goals: Week 3:  OT Short Term Goal 1 (Week 3): STGs=LTGs secondary to upcoming discharge  Skilled Therapeutic Interventions/Progress Updates:    Upon entering the room, pt seated in wheelchair near sink with sink running. OT asking about water running and pt verbalized wanting to brush teeth. Pt assisted via wheelchair to sink and able to remove dental bridge after multiple attempts. Pt brushing teeth but needing cuing and increased time to spit as pt was holding paste and water in mouth. Pt requesting to returning to bed. Stand pivot transfer with max A into bed towards the L side. Pt did initiate transfer with counting from therapist. Pt sitting on EOB with CGA for balance. Pt very emotional and tearful and reports, " I don't want to be like this". OT provided therapeutic use of self and attempting to encourage pt this session. Pt verbalized, " I want to see my family" and OT encouraged pt to continue to work hard to progress. Pt asking therapist to assist with changing socks and pt able to maintain balance while therapist provided total A to doff and donn B socks. Sit >supine with max A to EOB. OT repostioned pt into side lying with bed alarm activated and call bell within reach.   Therapy Documentation Precautions:  Precautions Precautions: Other (comment) Precaution Comments: L hemi Restrictions Weight Bearing Restrictions: No General:   Vital Signs: Therapy Vitals Temp: 98.1 F (36.7 C) Temp Source: Oral Pulse Rate: 66 Resp: 17 BP: 134/66 Patient Position (if appropriate): Lying Oxygen Therapy SpO2: 100 % O2 Device: Room Air   Therapy/Group: Individual Therapy  [name] 02/02/2020, 5:09 PM

## 2020-02-02 NOTE — Progress Notes (Signed)
Occupational Therapy Session Note  Patient Details  Name: Anne [name] MRN: [id number] Date of Birth: Aug 14, 1960  Today's Date: 02/02/2020 OT Individual Time: 0761-5183 OT Individual Time Calculation (min): 28 min  and Today's Date: 02/02/2020 OT Missed Time: 30 Minutes Missed Time Reason: Patient fatigue;Patient unwilling/refused to participate without medical reason   Short Term Goals: Week 3:  OT Short Term Goal 1 (Week 3): STGs=LTGs secondary to upcoming discharge  Skilled Therapeutic Interventions/Progress Updates:    Upon entering the room, pt sitting up in bed with NT present providing supervision for breakfast. OT taking over supervision of meal and turning TV off in order to decrease distractions within the room. Pt eating from R side of plate only with utensils and needing cuing to scan to L side of breakfast tray. OT turning plate for her to more easily see what was left on plate. Pt needing increased time and min cuing to finish chewing and tongue sweep L side. OT uncovered pt in order to don LB clothing and assist her into chair. Pt pulling covers back over body and declining further intervention. OT verbalized concern to pt with lack of motivation and attempting to encourage pt but she continues to decline. Pt repositioned for comfort. Bed alarm activated and OT reviewed schedule with pt. Call bell within reach upon exiting the room.   Therapy Documentation Precautions:  Precautions Precautions: Other (comment) Precaution Comments: L hemi Restrictions Weight Bearing Restrictions: No General: General OT Amount of Missed Time: 30 Minutes   Therapy/Group: Individual Therapy  [name] 02/02/2020, 8:37 AM

## 2020-02-02 NOTE — Progress Notes (Signed)
Anne [name] PHYSICAL MEDICINE & REHABILITATION PROGRESS NOTE   Subjective/Complaints:  Per OT participation has declined, "pt looks depressed".  Pt endorses this an dstates she has been on antidepressant in past.  Has been trialed on methylphenidate which has antidepressant action during stroke rehab stay ,no sig changes in affect  ROS: Patient denies CP, SOB, N/V/D    Objective:   No results found. Recent Labs    02/01/20 0640  WBC 8.0  HGB 13.6  HCT 42.4  PLT 214   Recent Labs    02/01/20 0640  NA 135  K 4.1  CL 103  CO2 23  GLUCOSE 92  BUN 21*  CREATININE 0.87  CALCIUM 10.9*    Intake/Output Summary (Last 24 hours) at 02/02/2020 0836 Last data filed at 02/01/2020 1900 Gross per 24 hour  Intake 360 ml  Output --  Net 360 ml     Physical Exam: Vital Signs Blood pressure 123/78, pulse 73, temperature 98.3 F (36.8 C), resp. rate 16, weight 91 kg, SpO2 98 %.   General: No acute distress Mood and affect are appropriate Heart: Regular rate and rhythm no rubs murmurs or extra sounds Lungs: Clear to auscultation, breathing unlabored, no rales or wheezes Abdomen: Positive bowel sounds, soft nontender to palpation, nondistended Extremities: No clubbing, cyanosis, or edema Skin: No evidence of breakdown, no evidence of rash  Neurologic: Cranial nerves II through XII intact, motor strength is 5/5 in right and trace 2- deltoid, bicep, tricep, grip, hip flexor, knee extensors, ankle dorsiflexor and plantar flexor/ left inattention  Musculoskeletal: Full range of motion in all 4 extremities. No joint swelling, no pain to palpation  minimal Left calf tenderness   Assessment/Plan: 1. 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  which require 3+ hours per day of interdisciplinary therapy in a comprehensive inpatient rehab setting.  Physiatrist is providing close team supervision and 24 hour management of active medical problems listed below.  Physiatrist and  rehab team continue to assess barriers to discharge/monitor patient progress toward functional and medical goals  Care Tool:  Bathing    Body parts bathed by patient: Left arm, Chest, Abdomen, Front perineal area, Right upper leg, Left upper leg   Body parts bathed by helper: Buttocks, Right arm, Right lower leg, Left lower leg, Face     Bathing assist Assist Level: Moderate Assistance - Patient 50 - 74%     Upper Body Dressing/Undressing Upper body dressing   What is the patient wearing?: Bra, Pull over shirt    Upper body assist Assist Level: Maximal Assistance - Patient 25 - 49%    Lower Body Dressing/Undressing Lower body dressing      What is the patient wearing?: Incontinence brief, Pants     Lower body assist Assist for lower body dressing: 2 Helpers     Toileting Toileting    Toileting assist Assist for toileting: Dependent - Patient 0%     Transfers Chair/bed transfer  Transfers assist     Chair/bed transfer assist level: Dependent - mechanical lift (+ 2 A)     Locomotion Ambulation   Ambulation assist   Ambulation activity did not occur: Safety/medical concerns  Assist level: Total Assistance - Patient < 25% Assistive device:  (wall rail) Max distance: 3 steps   Walk 10 feet activity   Assist  Walk 10 feet activity did not occur: Safety/medical concerns        Walk 50 feet activity   Assist Walk 50 feet with 2  turns activity did not occur: Safety/medical concerns         Walk 150 feet activity   Assist Walk 150 feet activity did not occur: Safety/medical concerns         Walk 10 feet on uneven surface  activity   Assist Walk 10 feet on uneven surfaces activity did not occur: Safety/medical concerns         Wheelchair     Assist Will patient use wheelchair at discharge?: Yes Type of Wheelchair: Manual    Wheelchair assist level: Dependent - Patient 0% Max wheelchair distance: 150    Wheelchair 50 feet  with 2 turns activity    Assist        Assist Level: Dependent - Patient 0%   Wheelchair 150 feet activity     Assist      Assist Level: Dependent - Patient 0%   Blood pressure 123/78, pulse 73, temperature 98.3 F (36.8 C), resp. rate 16, weight 91 kg, SpO2 98 %.  Medical Problem List and Plan: 1. Left hemiparesis with right gaze preference, has difficulty tracking beyond midline to the left and continues to have limited verbal output secondary to large right MCA infarct.   Continue CIR, PT, OT, SLP  SNF  Pending FL-2 completed, family now willing to take home if unable to place  Team conf in am   PRAFO/WHO qhs 2. Antithrombotics: -DVT/anticoagulation: Pharmaceutical: Lovenox -antiplatelet therapy: On ASA/Plavix 3. Pain Management: Tylenol prn. Sports creme Left calf tenderness as well as Right LE pain  C spine xray unremarkable  4. Mood: LCSW to follow for evaluation and support.  -antipsychotic agents: N/A 5. Neuropsych: This patient is not fully capable of making decisions on his own behalf. 6. Skin/Wound Care: Routine pressure relief measures 7. Fluids/Electrolytes/Nutrition: Monitor I/Os. Monitor I's and O's  8. HTN: Monitor BP tid--Imdur, lisinopril, cardizem and amlodipine has been on hold.  Cardizem 30 mg qid  Vitals:   02/01/20 2023 02/02/20 0343  BP: (!) 149/111 123/78  Pulse: 99 73  Resp: 16 16  Temp: 97.9 F (36.6 C) 98.3 F (36.8 C)  SpO2: 100% 98%   7/20 improved bp with low dose lisinopril on board 9. UTI: Resolved  10. COPD: Continue Pulmicort nebs bid. 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 To contact Dr. Katherina Right closer to discharge.  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  7/13: Upgrade to D2 thins based on swallow study 13.  Sleep disturbance?: Will monitor sleep wake cycle. Was on ambien and trazodone PTA? Ambien prn for now.   Improving overall   14. Azotemia due to dysphagia/elevated BUN   15. Hyperglycemia?  Resolved 16. Constipation: Resolved, hold on further PRN laxative 17.  Hx depression, has depressed affect which may be part of cognitive changes post CVA but will trial Lexapro  LOS: 25 days A FACE TO FACE EVALUATION WAS PERFORMED  Anne [name] 02/02/2020, 8:36 AM

## 2020-02-02 NOTE — Progress Notes (Signed)
Nutrition Follow-up  DOCUMENTATION CODES:   Obesity unspecified  INTERVENTION:   -Continue Magic CupTID with meals, each supplement provides 290 kcal and 9 grams of protein  -ContinuePro-stat 30 ml po BID, each supplement provides 100 kcal and 15 grams of protein  - Continue MVI with minerals daily  NUTRITION DIAGNOSIS:   Inadequate oral intake related to dysphagia as evidenced by meal completion < 50%.  Progressing  GOAL:   Patient will meet greater than or equal to 90% of their needs  Progressing  MONITOR:   PO intake, Supplement acceptance, Diet advancement, Labs, Weight trends  REASON FOR ASSESSMENT:   Consult Calorie Count  ASSESSMENT:   59 yo female admitted to Rehab from [facility] S/P stroke with functional deficits. PMH includes CAD, coronary vasospasms, asthma, tobacco abuse.  6/17 - s/p thrombectomy 6/18 - Cortrak placed 6/24 - s/p MBS, diet advanced to dysphagia 1 with nectar-thick liquids 6/25 - transferred to CIR, transitioned to nocturnal TF 7/07 - Cortrak removed, nocturnal TF d/c 7/12 - s/p MBS, diet advanced to dysphagia 2 with thin liquids 7/19 - diet advanced to dysphagia 3  Pt will either d/c home with family or d/c to SNF pending insurance approval.  Weight stable.  Pt was unavailable at time of RD attempted visit. Will continue with current supplement regimen.  Meal Completion: 25-75% x last 5 meals  Medications reviewed and include: Pro-stat BID, folic acid, Megace, MVI with minerals, thiamine  Labs reviewed.   Diet Order:   Diet Order            DIET DYS 3 Room service appropriate? Yes; Fluid consistency: Thin  Diet effective 1000                 EDUCATION NEEDS:   Education needs have been addressed  Skin:  Skin Assessment: Skin Integrity Issues: Incisions: R groin  Last BM:  02/02/20 medium type 5  Height:   Ht Readings from Last 1 Encounters:  12/31/19 5\' 6"  (1.676 m)    Weight:   Wt Readings from Last 1  Encounters:  01/25/20 91 kg    Ideal Body Weight:  59.1 kg  BMI:  Body mass index is 32.38 kg/m.  Estimated Nutritional Needs:   Kcal:  1800-2000  Protein:  100-120 gm  Fluid:  >/= 1.8 L    Gaynell Face, MS, RD, LDN Inpatient Clinical Dietitian Please see AMiON for contact information.

## 2020-02-02 NOTE — Progress Notes (Signed)
Patient ID: Anne [name], female   DOB: [birth year], 60 y.o.   MRN: [id number]  sw awaiting insurance approval or denial. Once decision made sw will move forward with confirming d/c date or scheduling education for family.

## 2020-02-02 NOTE — Plan of Care (Signed)
  Problem: Consults Goal: RH STROKE PATIENT EDUCATION Description: See Patient Education module for education specifics  Outcome: Progressing   Problem: RH BOWEL ELIMINATION Goal: RH STG MANAGE BOWEL WITH ASSISTANCE Description: STG Manage Bowel with  [place]. Outcome: Progressing   Problem: RH BLADDER ELIMINATION Goal: RH STG MANAGE BLADDER WITH ASSISTANCE Description: STG Manage Bladder With  Min Assistance  Outcome: Progressing   Problem: RH SKIN INTEGRITY Goal: RH STG SKIN FREE OF INFECTION/BREAKDOWN Description: Free of breakdown with min assist Outcome: Progressing   Problem: RH SAFETY Goal: RH STG ADHERE TO SAFETY PRECAUTIONS W/ASSISTANCE/DEVICE Description: STG Adhere to Safety Precautions With Min Assistance/Device.  Outcome: Progressing   Problem: RH COGNITION-NURSING Goal: RH STG USES MEMORY AIDS/STRATEGIES W/ASSIST TO PROBLEM SOLVE Description: STG Uses Memory Aids/Strategies With Min Assistance to Problem Solve.  Outcome: Progressing   Problem: RH KNOWLEDGE DEFICIT Goal: RH STG INCREASE KNOWLEDGE OF HYPERTENSION Description: Patient/family will be able to describe how to manage hypertension with cues/handouts Outcome: Progressing Goal: RH STG INCREASE KNOWLEDGE OF DYSPHAGIA/FLUID INTAKE Description: Patient/family will be able to describe safe diet and swallowing techniques with cues handouts Outcome: Progressing Goal: RH STG INCREASE KNOWLEDGE OF STROKE PROPHYLAXIS Description: Patient/family will be able to describe how to prevent stroke including diet and medication management with cues/handouts Outcome: Progressing

## 2020-02-02 NOTE — Progress Notes (Signed)
Physical Therapy Session Note  Patient Details  Name: Anne [name] MRN: [id number] Date of Birth: 11/02/1960  Today's Date: 02/02/2020 PT Individual Time: 1740-8144 and 1305-1350 PT Individual Time Calculation (min): 43 min and 45 min  Short Term Goals: Week 4:  PT Short Term Goal 1 (Week 4): Pt will maintain sitting balance EOB with supervision assist up to 5 min PT Short Term Goal 2 (Week 4): Pt will transfer to [facility] with mod assist and LRAD PT Short Term Goal 3 (Week 4): pt to perform WC mobility for 50 ft at min A  Skilled Therapeutic Interventions/Progress Updates: Pt presented in bed with nsg present agreeable to therapy. Pt c/o pain in "thighs and back" nsg applied ms rub to said areas. Pt performed rolling to R maxA and to L minA to allow PTA to thread pants total A. Pt then performed supine to sit with maxA x1 to EOB. Pt sat EOB and PTA donned bra total A and required maxA for donning shirt. Pt sat at EOB with close S as nsg set up and administered meds. Pt then performed squat pivot transfer to w/c modA x 2. Pt transported to day room and participated in Cybex Kintron 80cm/sec pt performed 10 cycles x 3 with PTA assisting with LLE but pt was able to initiate up to 3 pushes when stop due to increased distractions. Pt returned to room at end of session and left with belt alarm on, call bell within reach and needs met.   Tx2: Pt presented in bed agreeable to therapy. Pt c/o back pain with nsg present to apply Lidocaine patch. Pt performed supine to sit with modA and heavy use of bed features. Performed SB transfer to w/c with maxA x 1 due to decreased initiation from pt. Pt transported to hallway and performed stand with modA requiring increased time due to poor initiation. Participated in weight shifting at wall with PTA blocking L knee. With encouragement participated in gait training at wall with PTA advancing LLE and blocking L knee in stance phase and PTA providing facilitation at hip for  wt shift to L while encouraging pt to take larger step vs x 2 small steps. Pt ambulated a total of 75f maxA with w/c follow. Pt then participated in w/c mobility with pt demonstrating limited initiation requring verbal cues for R hand placement and to use R foot to help with w/c mobility. Pt was unable to coordinate using both UE and LE but was able to propel approx 180fwith use of single extremity. Pt transported back to room at end of session and requested to remain in w/c. Pt left with belt alarm on, call bell within reach and needs met.      Therapy Documentation Precautions:  Precautions Precautions: Other (comment) Precaution Comments: L hemi Restrictions Weight Bearing Restrictions: No General:   Vital Signs:   Pain:   Mobility:   Locomotion :    Trunk/Postural Assessment :    Balance:   Exercises:   Other Treatments:      Therapy/Group: Individual Therapy  Shyniece Scripter 02/02/2020, 12:45 PM

## 2020-02-03 ENCOUNTER — Inpatient Hospital Stay (HOSPITAL_COMMUNITY): Payer: Medicaid Other | Admitting: Occupational Therapy

## 2020-02-03 ENCOUNTER — Inpatient Hospital Stay (HOSPITAL_COMMUNITY): Payer: Medicaid Other | Admitting: Speech Pathology

## 2020-02-03 ENCOUNTER — Inpatient Hospital Stay (HOSPITAL_COMMUNITY): Payer: Medicaid Other

## 2020-02-03 NOTE — Progress Notes (Signed)
Patient ID: Anne [name], female   DOB: 04/14/1961, 59 y.o.   MRN: [id number]   Family education scheduled July 26-28 3-4PM

## 2020-02-03 NOTE — Progress Notes (Signed)
Patient ID: Anne [name], female   DOB: [birth year], 59 y.o.   MRN: [id number]  Sw left voicemail with [facility] admission director, Jackelyn Poling to follow up on auth

## 2020-02-03 NOTE — Progress Notes (Signed)
[place] PHYSICAL MEDICINE & REHABILITATION PROGRESS NOTE   Subjective/Complaints:   No issues overnite, pt admits to depressed mood after stroke, indicated that daughter told her she would go to "another place" after the hospital stay  ROS: Patient denies CP, SOB, N/V/D    Objective:   No results found. Recent Labs    02/01/20 0640  WBC 8.0  HGB 13.6  HCT 42.4  PLT 214   Recent Labs    02/01/20 0640  NA 135  K 4.1  CL 103  CO2 23  GLUCOSE 92  BUN 21*  CREATININE 0.87  CALCIUM 10.9*    Intake/Output Summary (Last 24 hours) at 02/03/2020 0834 Last data filed at 02/02/2020 1745 Gross per 24 hour  Intake 560 ml  Output --  Net 560 ml     Physical Exam: Vital Signs Blood pressure (!) 160/57, pulse 92, temperature 98.2 F (36.8 C), temperature source Oral, resp. rate 18, weight 91 kg, SpO2 96 %.    General: No acute distress Mood and affect are appropriate Heart: Regular rate and rhythm no rubs murmurs or extra sounds Lungs: Clear to auscultation, breathing unlabored, no rales or wheezes Abdomen: Positive bowel sounds, soft nontender to palpation, nondistended Extremities: No clubbing, cyanosis, or edema Skin: No evidence of breakdown, no evidence of rash  Neurologic: Cranial nerves II through XII intact, motor strength is 5/5 in right and trace 2- deltoid, bicep, tricep, grip, hip flexor, knee extensors, ankle dorsiflexor and plantar flexor/ left inattention  Musculoskeletal: Full range of motion in all 4 extremities. No joint swelling, no pain to palpation  minimal Left calf tenderness   Assessment/Plan: 1. 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  which require 3+ hours per day of interdisciplinary therapy in a comprehensive inpatient rehab setting.  Physiatrist is providing close team supervision and 24 hour management of active medical problems listed below.  Physiatrist and rehab team continue to assess barriers to discharge/monitor  patient progress toward functional and medical goals  Care Tool:  Bathing    Body parts bathed by patient: Left arm, Chest, Abdomen, Front perineal area, Right upper leg, Left upper leg   Body parts bathed by helper: Buttocks, Right arm, Right lower leg, Left lower leg, Face     Bathing assist Assist Level: Moderate Assistance - Patient 50 - 74%     Upper Body Dressing/Undressing Upper body dressing   What is the patient wearing?: Bra, Pull over shirt    Upper body assist Assist Level: Maximal Assistance - Patient 25 - 49%    Lower Body Dressing/Undressing Lower body dressing      What is the patient wearing?: Incontinence brief, Pants     Lower body assist Assist for lower body dressing: 2 Helpers     Toileting Toileting    Toileting assist Assist for toileting: Dependent - Patient 0%     Transfers Chair/bed transfer  Transfers assist     Chair/bed transfer assist level: Dependent - mechanical lift (+ 2 A)     Locomotion Ambulation   Ambulation assist   Ambulation activity did not occur: Safety/medical concerns  Assist level: Total Assistance - Patient < 25% Assistive device:  (wall rail) Max distance: 3 steps   Walk 10 feet activity   Assist  Walk 10 feet activity did not occur: Safety/medical concerns        Walk 50 feet activity   Assist Walk 50 feet with 2 turns activity did not occur: Safety/medical concerns  Walk 150 feet activity   Assist Walk 150 feet activity did not occur: Safety/medical concerns         Walk 10 feet on uneven surface  activity   Assist Walk 10 feet on uneven surfaces activity did not occur: Safety/medical concerns         Wheelchair     Assist Will patient use wheelchair at discharge?: Yes Type of Wheelchair: Manual    Wheelchair assist level: Dependent - Patient 0% Max wheelchair distance: 150    Wheelchair 50 feet with 2 turns activity    Assist        Assist  Level: Dependent - Patient 0%   Wheelchair 150 feet activity     Assist      Assist Level: Dependent - Patient 0%   Blood pressure (!) 160/57, pulse 92, temperature 98.2 F (36.8 C), temperature source Oral, resp. rate 18, weight 91 kg, SpO2 96 %.  Medical Problem List and Plan: 1. Left hemiparesis with right gaze preference, has difficulty tracking beyond midline to the left and continues to have limited verbal output secondary to large right MCA infarct.   Continue CIR, PT, OT, SLP  SNF  Pending FL-2 completed, family now willing to take home if unable to place  Team conference today please see physician documentation under team conference tab, met with team  to discuss problems,progress, and goals. Formulized individual treatment plan based on medical history, underlying problem and comorbidities.   PRAFO/WHO qhs 2. Antithrombotics: -DVT/anticoagulation: Pharmaceutical: Lovenox -antiplatelet therapy: On ASA/Plavix 3. Pain Management: Tylenol prn. Sports creme Left calf tenderness as well as Right LE pain  C spine xray unremarkable  4. Mood: LCSW to follow for evaluation and support.  -antipsychotic agents: N/A 5. Neuropsych: This patient is not fully capable of making decisions on his own behalf. 6. Skin/Wound Care: Routine pressure relief measures 7. Fluids/Electrolytes/Nutrition: Monitor I/Os. Monitor I's and O's  8. HTN: Monitor BP tid--Imdur, lisinopril, cardizem and amlodipine has been on hold.  Cardizem 30 mg qid  Vitals:   02/02/20 1655 02/02/20 1952  BP: 134/66 (!) 160/57  Pulse: 66 92  Resp: 17 18  Temp: 98.1 F (36.7 C) 98.2 F (36.8 C)  SpO2: 100% 96%   Elevated today may need to adjust lisinopril dose  9. UTI: Resolved  10. COPD: Continue Pulmicort nebs bid. 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 To contact Dr. Katherina Right closer to discharge.  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  7/13: Upgrade to D2 thins based on swallow study 13.  Sleep disturbance?: Will monitor sleep wake cycle. Was on ambien and trazodone PTA? Ambien prn for now.   Improving overall  14. Azotemia due to dysphagia/elevated BUN   15. Hyperglycemia?  Resolved 16. Constipation: Resolved, hold on further PRN laxative 17.  Hx depression, has depressed affect which may be part of cognitive changes post CVA but will trial Lexapro  LOS: 26 days A FACE TO FACE EVALUATION WAS PERFORMED  Charlett Blake 02/03/2020, 8:34 AM

## 2020-02-03 NOTE — Progress Notes (Signed)
Patient ID: Anne [name], female   DOB: 12-28-1960, 59 y.o.   MRN: [id number] Team Conference Report to Patient/Family  Team Conference discussion was reviewed with the patient and caregiver, including goals, any changes in plan of care and target discharge date.  Patient and caregiver express understanding and are in agreement.  The patient has a target discharge date of 02/10/20 (SNF vs Home with daughter).  [name] 02/03/2020, 1:47 PM

## 2020-02-03 NOTE — Patient Care Conference (Signed)
Inpatient RehabilitationTeam Conference and Plan of Care Update Date: 02/03/2020   Time: 1:38 PM    Patient Name: Anne [name]      Medical Record Number: [id number]  Date of Birth: March 07, 1961 Sex: Female         Room/Bed: 4W18C/4W18C-01 Payor Info: Payor: [place] / Plan: [name] / Product Type: *No Product type* /    Admit Date/Time:  01/08/2020  4:39 PM  Primary Diagnosis:  Acute right MCA stroke [facility])  Hospital Problems: Principal Problem:   Acute right MCA stroke ([place]) Active Problems:   Elevated BUN   Sleep disturbance   Left foot pain   Hemiparesis affecting left side as late effect of stroke [facility])    Expected Discharge Date: Expected Discharge Date: 02/10/20 (SNF vs Home with daughter)  Team Members Present: Physician leading conference: Dr. Alysia Penna Care Coodinator Present: Erlene Quan, BSW;Soma Bachand Hervey Ard, RN, BSN, [place] Nurse Present: Other (comment) Ollen Gross B RN) PT Present: Phylliss Bob, PTA OT Present: Darleen Crocker, OT SLP Present: Weston Anna, SLP PPS Coordinator present : Ileana Ladd, PT     Current Status/Progress Goal Weekly Team Focus  Bowel/Bladder   Incontinent of bowel and bladder, PVR per order  Regain control of bowel and bladder  Assess q shift and PRN for toileting needs   Swallow/Nutrition/ Hydration   Dys. 3 textures with thin liquids, Min A  Supervision  tolerance of current diet, use of swallowing compensatory strategies   ADL's   max - +2 slide board transfer, sitting balance EOB CGA, maxA UB self care, total A bed level LB- poor initiation and motivation this week  mod A overall  self care retraining, balance, functional transfers, L NMR, visual scanning/inattention, d/c planning   Mobility   minimal change since previous week, modA bed mobility, mod to maxA squat pivot transfer, maxA w/c mobility, maxA gait at wall, improved sitting balance, poor initiation for tasks  mod A  w/c level  transfers, d/c planning, L NMR   Communication   Min A  min A  utilization of speech intelligibility strategies   Safety/Cognition/ Behavioral Observations  Mod-Max A  Min-Mod A  visual scanning, attention, problem solving, recall   Pain   C/O pain to BLE 4/10  Decrease pain level to tolerable for patient  Assess q shift and PRN   Skin   No skin issues at this time  Maintain skin integrity  Assess q shift and prn     Team Discussion:  Discharge Planning/Teaching Needs:  Goal to discharge to SNF (patient has only received 2 bed offers, 1 facility pending insurance authorization) If authorization not approved, family will discharge patient home to provide care  Will schedule with family if discharging home   Current Update: on target  Current Barriers to Discharge:  Decreased caregiver support, Incontinence, Insurance for SNF coverage and limited resources for hired attendants  Possible Resolutions to Barriers: Medicaid application pending FL2 sent out to SNFs for review/bed offer pending   Patient on target to meet rehab goals: yes, Functional progress limited by emotional lability, poor concentration, poor initiation/motivation, limited participation and severe pushing.  *See Care Plan and progress notes for long and short-term goals.   Patient requires maximal assistance to initiate feeding and self feeding process is delayed = little po intake as patient gets fatigued and cannot continue. Continue full supervision/assistance with meals.  Revisions to Treatment Plan:  Lexapro added per MD 02/02/20; encourage patient to participate  in therapy sessions.  Plan family education over a couple of days to prep for discharge home with daughter/family.    Medical Summary Current Status: Depressed, started on SSRI, intake improving but still inconsistent Weekly Focus/Goal: Improved mood, improved participation in therapy  Barriers to Discharge: Behavior   Possible  Resolutions to Barriers: Medication management as well as neuropsych for mood disorder try to encourage participation in therapy   Continued Need for Acute Rehabilitation Level of Care: The patient requires daily medical management by a physician with specialized training in physical medicine and rehabilitation for the following reasons: Direction of a multidisciplinary physical rehabilitation program to maximize functional independence : Yes Medical management of patient stability for increased activity during participation in an intensive rehabilitation regime.: Yes Analysis of laboratory values and/or radiology reports with any subsequent need for medication adjustment and/or medical intervention. : Yes   I attest that I was present, lead the team conference, and concur with the assessment and plan of the team.   Dorien Chihuahua B 02/03/2020, 1:38 PM

## 2020-02-03 NOTE — Progress Notes (Signed)
Speech Language Pathology Daily Session Note  Patient Details  Name: Anne [name] MRN: [id number] Date of Birth: 07-10-1961  Today's Date: 02/03/2020 SLP Individual Time: 0725-0810 SLP Individual Time Calculation (min): 45 min  Short Term Goals: Week 4: SLP Short Term Goal 1 (Week 4): STG=LTG due to remaining LOS  Skilled Therapeutic Interventions: Skilled treatment session focused on cognitive and dysphagia goals. Upon arrival, patient was asleep and required more than a reasonable amount of time for arousal. Patient with minimal verbal output and strong right gaze preference requiring Max A multimodal cues to locate items in left field of environment. Patient consumed minimal amounts of breakfast tray of Dys. 3 textures with thin liquids. No overt s/s of aspiration were noted but Max A verbal cues were needed for small bites and attention to bolus. Recommend patient continue current diet with full supervision for safety. Patient left upright in bed with alarm on and all needs within reach. Continue with current plan of care.      Pain Headache, RN aware  Therapy/Group: Individual Therapy  Zera Markwardt 02/03/2020, 3:42 PM

## 2020-02-03 NOTE — Progress Notes (Signed)
Occupational Therapy Session Note  Patient Details  Name: Anne [name] MRN: [id number] Date of Birth: 23-Jan-1961  Today's Date: 02/03/2020 OT Individual Time: 1448-1856 and 1130-1155 OT Individual Time Calculation (min): 56 min and 25 mins   Short Term Goals: Week 3:  OT Short Term Goal 1 (Week 3): STGs=LTGs secondary to upcoming discharge  Skilled Therapeutic Interventions/Progress Updates:    Session 1: Upon entering the room, pt supine in bed and NT present requesting assistance to roll pt onto bed pan. Pt needing max A to roll L <> R. Pt on bed pan for 15 minutes and unable to void and bed pan removed. Pt able to perform peri hygiene and wash buttocks as well with set up A. Total A of 2 to don LB clothing from bed level. Pt reports 10/10 headache pain and RN arrived with medication this session. Supine >sit with total A to EOB. CGA for static sitting balance. Mod A to place slide board and then +2 for safety and management of equipment to transfer from bed >wheelchair. Pt does not initiate transfer at all and is therefore total A on slide board. Pt seated at sink for grooming tasks with set up A. Pt washing UB with mod A this session and max A to don shirt with hemiplegic dressing technique. Pt needing max cuing to attend to task and initiation this session. Pt remained in wheelchair with L arm tray donned, and chair alarm belt donned and activated for safety. Call bell and all needed items within reach.   Session 2: Upon entering the room, pt seated in wheelchair. OT removing brace on L UE for skin check. Pt is able to flex digits on command very little. Movement is not functional at this time. Pt reports increase in pain with gentle PROM to extend digits. PROM for L UE with focus on pt visually attending to task with pt needing mod cuing for task. Pt ranged in all planes very slowly and pt does report discomfort at times. Pt requesting hot pack for L LE pain while seated in wheelchair and it  was monitored during this session with no adverse effects. Pt remained in wheelchair and asked to stay up to eat lunch before requesting staff to assist back into bed. Pt was agreeable. Call bell and all needs within reach upon exiting the room.   Therapy Documentation Precautions:  Precautions Precautions: Other (comment) Precaution Comments: L hemi Restrictions Weight Bearing Restrictions: No   Therapy/Group: Individual Therapy  [name] 02/03/2020, 12:16 PM

## 2020-02-03 NOTE — Progress Notes (Signed)
Patient ID: Anne [name], female   DOB: 08-Mar-1961, 59 y.o.   MRN: [id number]   Sw has made daughter aware of the need for family education. Requested to have daughter send in days family will be available for education next week.

## 2020-02-03 NOTE — Progress Notes (Signed)
Physical Therapy Session Note  Patient Details  Name: Anne [name] MRN: [id number] Date of Birth: [birth year]  Today's Date: 02/03/2020 PT Individual Time: 6440-3474 PT Individual Time Calculation (min): 38 min   Short Term Goals: Week 3:  PT Short Term Goal 1 (Week 3): Pt will perform WC mobility x 42f with min assist PT Short Term Goal 1 - Progress (Week 3): Not met PT Short Term Goal 2 (Week 3): [facility] PT Short Term Goal 2 - Progress (Week 3): Not met PT Short Term Goal 3 (Week 3): Pt will maintain sitting balance EOB with supervision assist up to 5 min PT Short Term Goal 3 - Progress (Week 3): Not met  Week 4:  PT Short Term Goal 1 (Week 4): Pt will maintain sitting balance EOB with supervision assist up to 5 min PT Short Term Goal 2 (Week 4): [facility] PT Short Term Goal 3 (Week 4): pt to perform WC mobility for 50 ft at min A  Skilled Therapeutic Interventions/Progress Updates: Pt presented in w/c requesting to go back to bed. Pt c/o RLE pain pt was premedicated and ms rub applied. Pt transported to rehab gym for time management and energy conservation. PTA set up SB for transfer with pt requiring max to total A x 2 for SB transfer due to NO initiation from pt. Pt provided encouragement and extra time for all activities however pt unable to initiate scooting for SB transfer nor when on mat. Pt notably less engaged this session than yesterday. PTA attempted to have pt perform forward lean to chair and attempt to lift hips off mat for facilitation of scooting however pt unable to initiate forward motion even with PTA facilitating rocking motion. Pt returned to w/c via SB total A x 2 and transported back to room. Pt returned to bed via SB total A x 2 and required maxA x 2 for sit to supine. Pt repositioned to comfort and left in bed with bed alarm on, call bell within reach and needs met.      Therapy  Documentation Precautions:  Precautions Precautions: Other (comment) Precaution Comments: L hemi Restrictions Weight Bearing Restrictions: No General:   Vital Signs: Therapy Vitals Temp: 98.5 F (36.9 C) Temp Source: Oral Pulse Rate: 98 Resp: 17 BP: (!) 161/88 Patient Position (if appropriate): Sitting Oxygen Therapy SpO2: 94 % O2 Device: Room Air   Therapy/Group: Individual Therapy  Hawkin Charo  Jolie Strohecker, PTA  02/03/2020, 3:33 PM

## 2020-02-04 ENCOUNTER — Inpatient Hospital Stay (HOSPITAL_COMMUNITY): Payer: Medicaid Other | Admitting: Physical Therapy

## 2020-02-04 ENCOUNTER — Inpatient Hospital Stay (HOSPITAL_COMMUNITY): Payer: Medicaid Other | Admitting: Occupational Therapy

## 2020-02-04 ENCOUNTER — Inpatient Hospital Stay (HOSPITAL_COMMUNITY): Payer: Medicaid Other | Admitting: Speech Pathology

## 2020-02-04 NOTE — Progress Notes (Signed)
[place] PHYSICAL MEDICINE & REHABILITATION PROGRESS NOTE   Subjective/Complaints:  Tolerating thin liquids , 40-75% meal intake  ROS: Patient denies CP, SOB, N/V/D    Objective:   No results found. No results for input(s): WBC, HGB, HCT, PLT in the last 72 hours. No results for input(s): NA, K, CL, CO2, GLUCOSE, BUN, CREATININE, CALCIUM in the last 72 hours. No intake or output data in the 24 hours ending 02/04/20 0834   Physical Exam: Vital Signs Blood pressure (!) 142/65, pulse 87, temperature 98.5 F (36.9 C), temperature source Oral, resp. rate 17, weight 91 kg, SpO2 94 %.    General: No acute distress Mood and affect are appropriate Heart: Regular rate and rhythm no rubs murmurs or extra sounds Lungs: Clear to auscultation, breathing unlabored, no rales or wheezes Abdomen: Positive bowel sounds, soft nontender to palpation, nondistended Extremities: No clubbing, cyanosis, or edema Skin: No evidence of breakdown, no evidence of rash  Neurologic: Cranial nerves II through XII intact, motor strength is 5/5 in right and trace 2- deltoid, bicep, tricep, grip, hip flexor, knee extensors, ankle dorsiflexor and plantar flexor/ left inattention  Musculoskeletal: Full range of motion in all 4 extremities. No joint swelling, no pain to palpation  minimal Left calf tenderness   Assessment/Plan: 1. 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  which require 3+ hours per day of interdisciplinary therapy in a comprehensive inpatient rehab setting.  Physiatrist is providing close team supervision and 24 hour management of active medical problems listed below.  Physiatrist and rehab team continue to assess barriers to discharge/monitor patient progress toward functional and medical goals  Care Tool:  Bathing    Body parts bathed by patient: Chest, Abdomen, Front perineal area, Right upper leg, Left upper leg, Buttocks, Face   Body parts bathed by helper: Right lower leg,  Left lower leg, Right arm, Left arm     Bathing assist Assist Level: Moderate Assistance - Patient 50 - 74%     Upper Body Dressing/Undressing Upper body dressing   What is the patient wearing?: Bra, Pull over shirt    Upper body assist Assist Level: Maximal Assistance - Patient 25 - 49%    Lower Body Dressing/Undressing Lower body dressing      What is the patient wearing?: Incontinence brief, Pants     Lower body assist Assist for lower body dressing: 2 Helpers     Toileting Toileting    Toileting assist Assist for toileting: Dependent - Patient 0%     Transfers Chair/bed transfer  Transfers assist     Chair/bed transfer assist level: 2 Helpers     Locomotion Ambulation   Ambulation assist   Ambulation activity did not occur: Safety/medical concerns  Assist level: Total Assistance - Patient < 25% Assistive device:  (wall rail) Max distance: 3 steps   Walk 10 feet activity   Assist  Walk 10 feet activity did not occur: Safety/medical concerns        Walk 50 feet activity   Assist Walk 50 feet with 2 turns activity did not occur: Safety/medical concerns         Walk 150 feet activity   Assist Walk 150 feet activity did not occur: Safety/medical concerns         Walk 10 feet on uneven surface  activity   Assist Walk 10 feet on uneven surfaces activity did not occur: Safety/medical concerns         Wheelchair     Assist Will  patient use wheelchair at discharge?: Yes Type of Wheelchair: Manual    Wheelchair assist level: Dependent - Patient 0% Max wheelchair distance: 150    Wheelchair 50 feet with 2 turns activity    Assist        Assist Level: Dependent - Patient 0%   Wheelchair 150 feet activity     Assist      Assist Level: Dependent - Patient 0%   Blood pressure (!) 142/65, pulse 87, temperature 98.5 F (36.9 C), temperature source Oral, resp. rate 17, weight 91 kg, SpO2 94 %.  Medical  Problem List and Plan: 1. Left hemiparesis with right gaze preference, has difficulty tracking beyond midline to the left and continues to have limited verbal output secondary to large right MCA infarct.   Continue CIR, PT, OT, SLP  SNF  Pending FL-2 completed, family now willing to take home if unable to place     PRAFO/WHO qhs 2. Antithrombotics: -DVT/anticoagulation: Pharmaceutical: Lovenox -antiplatelet therapy: On ASA/Plavix 3. Pain Management: Tylenol prn. Sports creme Left calf tenderness as well as Right LE pain  C spine xray unremarkable  4. Mood: LCSW to follow for evaluation and support.  -antipsychotic agents: N/A 5. Neuropsych: This patient is not fully capable of making decisions on his own behalf. 6. Skin/Wound Care: Routine pressure relief measures 7. Fluids/Electrolytes/Nutrition: Monitor I/Os. Monitor I's and O's  8. HTN: Monitor BP tid--Imdur, lisinopril, cardizem and amlodipine has been on hold.  Cardizem 30 mg qid  Vitals:   02/03/20 1932 02/04/20 0325  BP: (!) 152/68 (!) 142/65  Pulse: 99 87  Resp: 17 17  Temp: 98.5 F (36.9 C) 98.5 F (36.9 C)  SpO2: 92% 94%   Monitor for trend prior to dose change  9. UTI: Resolved  10. COPD: Continue Pulmicort nebs bid. 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 To contact Dr. Katherina Right closer to discharge.  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  7/13: Upgrade to D2 thins based on swallow study 13.  Sleep disturbance?: Will monitor sleep wake cycle. Was on ambien and trazodone PTA? Ambien prn for now.   Improving overall  14. Azotemia due to dysphagia/elevated BUN   15. Hyperglycemia?  Resolved 16. Constipation: Resolved, hold on further PRN laxative 17.  Hx depression, has depressed affect which may be part of cognitive changes post CVA but will trial Lexapro 5mg  , re eval dose after 1 wk  LOS: 27 days A FACE TO FACE EVALUATION  WAS PERFORMED  Charlett Blake 02/04/2020, 8:34 AM

## 2020-02-04 NOTE — Progress Notes (Addendum)
Physical Therapy Session Note  Patient Details  Name: Anne [name] MRN: [id number] Date of Birth: 09-17-1960  Today's Date: 02/04/2020 PT Individual Time: 0900-1000 PT Individual Time Calculation (min): 60 min   Short Term Goals: Week 1:  PT Short Term Goal 1 (Week 1): Pt will sit EOB with min assist x 5 mintues PT Short Term Goal 1 - Progress (Week 1): Met PT Short Term Goal 2 (Week 1): Pt will initiate gait training PT Short Term Goal 2 - Progress (Week 1): Met PT Short Term Goal 3 (Week 1): Pt will transfer to and from [facility] with mod assist +2 using SB. PT Short Term Goal 3 - Progress (Week 1): Progressing toward goal PT Short Term Goal 4 (Week 1): Pt will perform bed mobility with max assist of 1 PT Short Term Goal 4 - Progress (Week 1): Met Week 2:  PT Short Term Goal 1 (Week 2): Pt will transfer to and from med with max assist of 1 consistently PT Short Term Goal 1 - Progress (Week 2): Met PT Short Term Goal 2 (Week 2): Family will begin hands on education PT Short Term Goal 2 - Progress (Week 2): Progressing toward goal PT Short Term Goal 3 (Week 2): Pt will perform WC mobility with hemi technique with mod assist x 50 ft PT Short Term Goal 3 - Progress (Week 2): Met PT Short Term Goal 4 (Week 2): Pt will perform gait training with HW x 58ft and max assist +2. PT Short Term Goal 4 - Progress (Week 2): Met Week 3:  PT Short Term Goal 1 (Week 3): Pt will perform WC mobility x 86ft with min assist PT Short Term Goal 1 - Progress (Week 3): Not met PT Short Term Goal 2 (Week 3): Pt will transfer to [facility] with mod assist and LRAD PT Short Term Goal 2 - Progress (Week 3): Not met PT Short Term Goal 3 (Week 3): Pt will maintain sitting balance EOB with supervision assist up to 5 min PT Short Term Goal 3 - Progress (Week 3): Not met  Skilled Therapeutic Interventions/Progress Updates:    pt received in bed and agreeable to therapy. Pt directed in rolling to R and L at min A to pt's L and  max A to R with VC for technique and initiation to don pants, clothing management total assist for time management. Pt directed in side lying to sitting on L side of bed, max A to come to sitting with pt able to bring RLE to EOB and manual assist for LLE to mobilize though pt did initiate, pt attempted to push trunk up to sitting but ultimately max A to complete overall. Pt directed in upper body dressing at total assist for time. Pt directed in slide board transfer to pt's R at total A x2 and total assist for positioning of pelvis once in chair. Pt directed in 8 mins at stepping assist machine with manual assist to increase initial initiation then pt able to initiate reciprocal stepping pattern without weighted resistance at machine and improved with tactile cues and visual cues for pt to see her leg mobilize. Max encouragement required. Pt directed in x2 STS from [facility] to rail at max A to initiate but once pt initiated stand, mod A x1 to completely finish stance, stood for 2 mins statically with intermittent L knee blocking. Pt directed In third stance at rail and then lead into gait training at rail, mod A for trunk support and stability  and total assist for LLE to mobilize and knee blocking required, manual weight shifting required with VC to complete however after 5' pt demonstrated self initiation without cues for RLE advancement and RUE advancement at rail and only required manual facilitation of LLE to place and knee blocking for stance; overall max A to complete 15. Pt directed in one additional stance at rail at max A to come to standing and min A to remain standing for 2 mins with RUE at rail. Pt directed in [place] mobility for 25' at max A to complete with pt able to use RUE with max VC and tactile cues and manual assist for initiation and technique with RLE to assist. Pt taken back to room for time, once in room pt requested to return to bed and self initiated stance to attempt. Pt directed to return to  sitting in [place] and directed in slide board transfer with pt able to push with RUE and scoot to return to bed at mod A, min A to sit>supine.  Pt left in bed, alarm set, All needs in reach and in good condition. Call light in hand.  Pt reported pain in R knee but unable to rank. Pt positioned for comfort and nursing informed.   Therapy Documentation Precautions:  Precautions Precautions: Other (comment) Precaution Comments: L hemi Restrictions Weight Bearing Restrictions: No Pain Assessment Pain Scale: Faces Faces Pain Scale: Hurts little more Pain Type: Acute pain Pain Location: Head Pain Descriptors / Indicators: Headache Pain Frequency: Intermittent Pain Intervention(s): Medication (See eMAR)    Therapy/Group: Individual Therapy  Junie Panning 02/04/2020, 10:18 AM

## 2020-02-04 NOTE — Progress Notes (Signed)
Speech Language Pathology Daily Session Note  Patient Details  Name: Anne [name] MRN: [id number] Date of Birth: Nov 20, 1960  Today's Date: 02/04/2020 SLP Individual Time: 1916-6060 SLP Individual Time Calculation (min): 40 min  Short Term Goals: Week 4: SLP Short Term Goal 1 (Week 4): STG=LTG due to remaining LOS  Skilled Therapeutic Interventions: Skilled treatment session focused on cognitive goals. Upon arrival, patient was attempting to use the phone to call her daughter. SLP facilitated session by providing Min A verbal cues for accuracy, problem solving and speech with task. While talking on the phone, it was clear that patient had just talked to her daughter and was asking some of the same questions. The patient required Max A verbal cues to hang up the phone as she remained on the line ~1 minute after her daughter had already hung up. Patient requested to use the hand sanitizer and required Max A verbal cues for problem solving and attention throughout task. The entire ttask took  ~10 minutes to complete. Overall, patient with minimal verbal output with Max A multimodal cues needed to complete basic and familiar tasks. Patient left upright in bed with alarm on and all needs within reach. Continue with current plan of care.      Pain No/Denies Pain   Therapy/Group: Individual Therapy  Kimmberly Wisser 02/04/2020, 3:39 PM

## 2020-02-04 NOTE — Progress Notes (Signed)
Occupational Therapy Weekly Progress Note  Patient Details  Name: Anne [name] MRN: [id number] Date of Birth: 22-Aug-1960  Beginning of progress report period: January 27, 2020 End of progress report period: February 04, 2020  Today's Date: 02/04/2020 OT Individual Time: 6294-7654 OT Individual Time Calculation (min): 54 min    Patient has met 0 of 4 long term goals.  Short term goals not set due to estimated length of stay. Pt making limited progress this week with barriers being decreased motivation to participate in therapeutic intervention. Pt has reported feeling "depressed" to physician and needs maximal encouragement to participate. Family members will be attending therapy session as they are now taking her home due to insurance denial for SNF. Hands on family education to start as soon as family available. Pt needing max - slide board total A +2 for functional transfer. Toileting performed from bed level secondary to safety with pt's decrease assistance. Max A for UB self care and total A for LB self care from bed level. Pt with small movement in digits, wrist, and elbow but unable to perform on command or engage functionally.   Patient continues to demonstrate the following deficits: muscle weakness, decreased cardiorespiratoy endurance, abnormal tone, ataxia, decreased coordination and decreased motor planning, field cut, decreased midline orientation and decreased attention to left, decreased initiation, decreased attention, decreased awareness, decreased problem solving and decreased safety awareness, strength and decreased sitting balance, decreased standing balance, decreased postural control, hemiplegia and decreased balance strategies and therefore will continue to benefit from skilled OT intervention to enhance overall performance with BADL and Reduce care partner burden.  See Patient's Care Plan for progression toward long term goals.  Patient progressing toward long term goals..   Continue plan of care.  Skilled Therapeutic Interventions/Progress Updates:    Upon entering the room, pt supine in bed and sleeping soundly. Pt initially refusing OT intervention this session and provided max cuing for participation. Pt agreeable to eating lunch on EOB. Pt performed supine >sit with total A to EOB. Pt sitting for 40 minutes for meal with S - CGA for balance. [name] tray set up with containers opened. Pt needing 40 minutes to consume 1 cup of soup and 1 roll. Pt needing increased time, max cuing, and occasional hand over hand assistance to sequence and initiate routine task of eating. Pt holding roll in hand over soup cup for 7 minutes but unable to initiate bringing it to mouth or dipping into soup without hand over hand assist to get started. Pt does report enjoying the soup very much. Environment very quiet with door closed and TV off to decrease distractions. Pt requesting to return to bed at end of session.Total A for sit >supine. OT repositioned pt in bed for comfort and to protect hemiplegic side. Bed alarm activated and call bell within reach on R side.   Therapy Documentation Precautions:  Precautions Precautions: Other (comment) Precaution Comments: L hemi Restrictions Weight Bearing Restrictions: No General:   Vital Signs: Therapy Vitals Temp: 98.5 F (36.9 C) Temp Source: Oral Pulse Rate: 83 Resp: 17 BP: (!) 136/77 Patient Position (if appropriate): Lying Oxygen Therapy SpO2: 99 % O2 Device: Room Air   Therapy/Group: Individual Therapy  [name] 02/04/2020, 4:14 PM

## 2020-02-05 ENCOUNTER — Inpatient Hospital Stay (HOSPITAL_COMMUNITY): Payer: Medicaid Other

## 2020-02-05 ENCOUNTER — Inpatient Hospital Stay (HOSPITAL_COMMUNITY): Payer: Medicaid Other | Admitting: Physical Therapy

## 2020-02-05 ENCOUNTER — Inpatient Hospital Stay (HOSPITAL_COMMUNITY): Payer: Medicaid Other | Admitting: Occupational Therapy

## 2020-02-05 MED ORDER — POLYETHYLENE GLYCOL 3350 17 G PO PACK
17.0000 g | PACK | Freq: Every day | ORAL | Status: DC | PRN
  Administered 2020-02-12 – 2020-02-15 (×2): 17 g via ORAL
  Filled 2020-02-05 (×2): qty 1

## 2020-02-05 NOTE — Progress Notes (Signed)
Occupational Therapy Session Note  Patient Details  Name: Anne [name] MRN: [id number] Date of Birth: 10-Mar-1961  Today's Date: 02/05/2020 OT Individual Time: 1497-0263 OT Individual Time Calculation (min): 30 min    Short Term Goals: Week 1:  OT Short Term Goal 1 (Week 1): Pt will complete sit<>stand for ADL with max A. OT Short Term Goal 1 - Progress (Week 1): Not met OT Short Term Goal 2 (Week 1): Pt will locate 3 self-care items on L side with mod cues. OT Short Term Goal 2 - Progress (Week 1): Not met OT Short Term Goal 3 (Week 1): Pt will don shirt with mod A using hemi dressing techniques. OT Short Term Goal 3 - Progress (Week 1): Not met OT Short Term Goal 4 (Week 1): Pt will demonstrate static sitting balance for 30+ seconds with CGA. OT Short Term Goal 4 - Progress (Week 1): Met Week 2:  OT Short Term Goal 1 (Week 2): Pt will transfer onto commode with assist of 1 person. OT Short Term Goal 1 - Progress (Week 2): Not met OT Short Term Goal 2 (Week 2): Pt will don UB clothing with mod A overall. OT Short Term Goal 2 - Progress (Week 2): Met OT Short Term Goal 3 (Week 2): Pt will locate 3 self care items to the L with mod cuing. OT Short Term Goal 3 - Progress (Week 2): Met Week 3:  OT Short Term Goal 1 (Week 3): STGs=LTGs secondary to upcoming discharge OT Short Term Goal 1 - Progress (Week 3): Not met Week 4:  OT Short Term Goal 1 (Week 4): STGs=LTGs secondary to upcoming discharge  Skilled Therapeutic Interventions/Progress Updates:    Pt received in wc ready for therapy. Pt taken to therapeutic dance group and worked on LUE PROM using RUE and mod A from therapist to guide movements to "dance" with the music.  For trunk mobility, pt guided in trunk rotation with facilitation through scapula and then torso for lateral leans. Pt seemed to enjoy the music and appeared to smile (pt had mask on).  Taken back to room to start lunch. Place upper dentures in pt's mouth.  Pt had a  lunch of beef and broccoli which she shook her head and said no to.  She was willing to eat the angel food cake. For some protein, mixed her sherbet magic cup in with pieces of the cake. She liked the "ice cream cake" mixture and ate 90% of the cake and 30% of the magic cup. No cuing needed to scoop food and good initiation of self feeding.  No L side pocketing noted.  Pt then pushed tray away.  Pt resting in wc with belt alarm on.  Informed her NT of how much she had eaten.   Therapy Documentation Precautions:  Precautions Precautions: Other (comment) Precaution Comments: L hemi Restrictions Weight Bearing Restrictions: No      Pain: Pain Assessment Pain Score: 0-No pain  Therapy/Group: Individual Therapy  [place] 02/05/2020, 1:14 PM

## 2020-02-05 NOTE — Progress Notes (Signed)
Physical Therapy Session Note  Patient Details  Name: Anne [name] MRN: [id number] Date of Birth: June 01, 1961  Today's Date: 02/05/2020 PT Individual Time: 0805-0900; 1000-1030; 3977-0569 PT Individual Time Calculation (min): 55 min and 30 min and 74 min  Short Term Goals: Week 1:  PT Short Term Goal 1 (Week 1): Pt will sit EOB with min assist x 5 mintues PT Short Term Goal 1 - Progress (Week 1): Met PT Short Term Goal 2 (Week 1): Pt will initiate gait training PT Short Term Goal 2 - Progress (Week 1): Met PT Short Term Goal 3 (Week 1): Pt will transfer to and from [facility] with mod assist +2 using SB. PT Short Term Goal 3 - Progress (Week 1): Progressing toward goal PT Short Term Goal 4 (Week 1): Pt will perform bed mobility with max assist of 1 PT Short Term Goal 4 - Progress (Week 1): Met Week 2:  PT Short Term Goal 1 (Week 2): Pt will transfer to and from med with max assist of 1 consistently PT Short Term Goal 1 - Progress (Week 2): Met PT Short Term Goal 2 (Week 2): Family will begin hands on education PT Short Term Goal 2 - Progress (Week 2): Progressing toward goal PT Short Term Goal 3 (Week 2): Pt will perform WC mobility with hemi technique with mod assist x 50 ft PT Short Term Goal 3 - Progress (Week 2): Met PT Short Term Goal 4 (Week 2): Pt will perform gait training with HW x 33ft and max assist +2. PT Short Term Goal 4 - Progress (Week 2): Met Week 3:  PT Short Term Goal 1 (Week 3): Pt will perform WC mobility x 59ft with min assist PT Short Term Goal 1 - Progress (Week 3): Not met PT Short Term Goal 2 (Week 3): Pt will transfer to [facility] with mod assist and LRAD PT Short Term Goal 2 - Progress (Week 3): Not met PT Short Term Goal 3 (Week 3): Pt will maintain sitting balance EOB with supervision assist up to 5 min PT Short Term Goal 3 - Progress (Week 3): Not met  Skilled Therapeutic Interventions/Progress Updates:    session one: pt received in bed and agreeable to therapy but  currently eating breakfast. Pt positioned in bed with increased HOB elevation, midline for improved safety and effectiveness for feeding. Pt able to be directed in with VC and tactile cues for scanning full tray into L direction and reaching for picking up food with utensils with RUE and fully bringing food to mouth. Pt did demonstrated distraction and stop eating however when asked if she wanted to continue she would say yes and with a tactile cue to use RUE she would initiate self feeding. Pt also given cues throughout to take small bites and sips and to clear mouth fully before initiating a new bit. Pt then directed in face washing with cloth with RUE post breakfast with VC to clean L side of face but pt able to complete with cues. Pt directed in rolling in bed to pt L max A and to R min A for donning pants, total assist for time management. Pt then directed in sidelying>sit at EOB mod A with pt able to initiate pushing into sitting with RUE. Pt sat EOB statically at [facility] for short period of time, therapist assisted in donning shirt at bedside with total assist for time management and min A for balance for pt to remain at midline. Pt directed in slide board  transfer to [facility] at total assist x2 this morning and total assist for repositioning once in chair. Pt directed in [facility] mobility with forward propulsion at mod A x1 to assist in directional changes and maintain forward direction, pt able to forward propel with RUE with VC and tactile cues. Pt left in WC, alarm set. Lap tray in place, All needs in reach and in good condition. Call light in hand.    Second session: pt received in bed and agreeable to therapy. Pt directed in rolling to pt's R with max A to complete, sidelying to sit EOB at max A x1. CGA for sitting balance at EOB. Pt directed in slide board transfer to [facility], however initially attempted to stand pivot to [facility] with extra time spent to encourage pt to participate and assist in transfer, pt demonstrated no  initiation with tactile cues, VC, or attempting to manually assist pt in initiation of task. Slide board to chair total assist x2. Pt taken to gym for time management, instructed in stand pivot to mat table toward pt's R, max A to come to standing with L knee blocking and max A to pivot to mat table. Once sitting EOB pt directed in x5 reaching for dynamic balance cross body in forward/rotational directions and then cross body to L, RUE used throughout, min A for trunk support to return to midline. Pt directed in stand pivot to WC on pt's R with max A x1 with L knee blocking and assistance to initiate transfer.   Third session: pt received in chair and agreeable to therapy. Pt reported she needed to use the restroom and assisted in toilet transfer from [facility] and taken into restroom in [facility]; max A x2 for stand pivot transfer to toilet with use of grab bar and manual assist for LLE placement and safety with transferring down. Pt sat on toilet without use of grab bar at CGA. Pt attempted to clean herself at [facility] with extra time to initiate. Pt directed in STS from toilet with use of grab bar at max A with pt very much benefiting from initiating the movement and then she is able to come to standing. Pt directed in transfer to [facility] at max A x1 to complete with manual assist of LLE for placement. Pt taken to hallway to initiate gait training this session, taken in chair for time management. Pt directed in gait training with x2 persons to assist laterally of pt at max A x2 with pt able to initiate RLE to advance limb however limited 2/2 poor step length, manual placement of LLE provided and limited 2/2 tone in hamstrings and adductor regions but able to place; 20'. Pt then directed in STS from [facility] to standing at rail with RUE at rail to assist, max A x1 to come to standing. Pt directed in second rep of gait training with use of rail at [place], 20' max A x1 with manual assist for LLE mobility with poor step length on RLE. Pt directed  in final gait training rep for 15' at max A with targets for RLE placement and use of rail this improved initiation on RLE and pt able to place foot at improved step length and sequencing; manual weight shifting and placement of LLE required. Pt directed in [place] mobility for total of 75' with multiple rest breaks at mod A-max A to complete with max VC and Tactile cues for RUE and intermittent manual assist to progress distance, pt demonstrated intermittent use of RLE to propel  as well. Pt left in room, in [facility], alarm set, All needs in reach and in good condition. Call light in hand.    Therapy Documentation Precautions:  Precautions Precautions: Other (comment) Precaution Comments: L hemi Restrictions Weight Bearing Restrictions: No     Therapy/Group: Individual Therapy  Junie Panning 02/05/2020, 10:37 AM

## 2020-02-05 NOTE — Plan of Care (Signed)
  Problem: RH Swallowing Goal: LTG Patient will consume least restrictive diet using compensatory strategies with assistance (SLP) Description: LTG:  Patient will consume least restrictive diet using compensatory strategies with assistance (SLP) Flowsheets (Taken 02/05/2020 0654) LTG: Pt Patient will consume least restrictive diet using compensatory strategies with assistance of (SLP): Moderate Assistance - Patient 50 - 74% Note: Downgraded due to lack of progress  Goal: LTG Patient will participate in dysphagia therapy to increase swallow function with assistance (SLP) Description: LTG:  Patient will participate in dysphagia therapy to increase swallow function with assistance (SLP) Flowsheets (Taken 02/05/2020 0654) LTG: Pt will participate in dysphagia therapy to increase swallow function with assistance of (SLP): Moderate Assistance - Patient 50 - 74% Note: Downgraded due to lack of progress    Problem: RH Problem Solving Goal: LTG Patient will demonstrate problem solving for (SLP) Description: LTG:  Patient will demonstrate problem solving for basic/complex daily situations with cues  (SLP) Flowsheets (Taken 02/05/2020 0654) LTG Patient will demonstrate problem solving for: Maximal Assistance - Patient 25 - 49% Note: Downgraded due to lack of progress    Problem: RH Memory Goal: LTG Patient will use memory compensatory aids to (SLP) Description: LTG:  Patient will use memory compensatory aids to recall biographical/new, daily complex information with cues (SLP) Flowsheets (Taken 02/05/2020 0654) LTG: Patient will use memory compensatory aids to (SLP): Maximal Assistance - Patient 25 - 49% Note: Downgraded due to lack of progress    Problem: RH Attention Goal: LTG Patient will demonstrate this level of attention during functional activites (SLP) Description: LTG:  Patient will will demonstrate this level of attention during functional activites (SLP) Flowsheets (Taken 02/05/2020  0654) Patient will demonstrate during cognitive/linguistic activities the attention type of: Sustained LTG: Patient will demonstrate this level of attention during cognitive/linguistic activities with assistance of (SLP): Maximal Assistance - Patient 25 - 49% Note: Downgraded due to lack of progress    Problem: RH Awareness Goal: LTG: Patient will demonstrate awareness during functional activites type of (SLP) Description: LTG: Patient will demonstrate awareness during functional activites type of (SLP) Flowsheets (Taken 02/05/2020 0654) LTG: Patient will demonstrate awareness during cognitive/linguistic activities with assistance of (SLP): Maximal Assistance - Patient 25 - 49% Note: Downgraded due to lack of progress

## 2020-02-05 NOTE — Progress Notes (Signed)
Patient ID: Anne [name], female   DOB: [birth year], 60 y.o.   MRN: [id number]  This SW covering for assigned SW, Erlene Quan.   SW spoke with [place] ([phone]) to discuss insurance authorization. States no updates, will follow-up with insurance.  Sw called pt dtr [name] [phone]) to inform on above. She reported there were concerned if pt has full coverage Medicaid or just family planning. Reports that she spoke with a Medicaid worker [phone]) but unable to recall her name.   SW spoke with [place] [employer] Adult and Children Medicaid [phone]) to discuss phone call with pt dtr. States pt has full Medicaid. Pt will be denied for family and children Medicaid, and will follow-up with pt Adult Medicaid Worker- Justina to inform there have been no changes to her financial status so she continue with Medicaid. Pt Medicaid certification period ends on 7/31. Cherre Robins again will follow-up with pt assigned Medicaid worker to hopefully resolve any potential lapses in coverage.   SW called pt dtr [name] to update on above.   *Sw received return phone call from [facility] stating pt current certification ends 03/5637. States assigned Medicaid worker Charlean Merl will review pt Medicaid status next month and her benefits should continue. She intends to follow-up with pt dtr [name].   Loralee Pacas, MSW, [place] Office: [phone] Cell: [phone] Fax: [phone]

## 2020-02-05 NOTE — Progress Notes (Signed)
[facility] PHYSICAL MEDICINE & REHABILITATION PROGRESS NOTE   Subjective/Complaints:  No issues overnite  ROS: Patient denies CP, SOB, N/V/D    Objective:   No results found. No results for input(s): WBC, HGB, HCT, PLT in the last 72 hours. No results for input(s): NA, K, CL, CO2, GLUCOSE, BUN, CREATININE, CALCIUM in the last 72 hours.  Intake/Output Summary (Last 24 hours) at 02/05/2020 3790 Last data filed at 02/05/2020 0023 Gross per 24 hour  Intake 822 ml  Output --  Net 822 ml     Physical Exam: Vital Signs Blood pressure 126/78, pulse 74, temperature 98.1 F (36.7 C), resp. rate 16, weight 91 kg, SpO2 96 %.     General: No acute distress Mood and affect are appropriate Heart: Regular rate and rhythm no rubs murmurs or extra sounds Lungs: Clear to auscultation, breathing unlabored, no rales or wheezes Abdomen: Positive bowel sounds, soft nontender to palpation, nondistended Extremities: No clubbing, cyanosis, or edema Skin: No evidence of breakdown, no evidence of rash  Neurologic: Cranial nerves II through XII intact, motor strength is 5/5 in right and trace 2- deltoid, bicep, tricep, grip, hip flexor, knee extensors, ankle dorsiflexor and plantar flexor/ left inattention  Musculoskeletal: Full range of motion in all 4 extremities. No joint swelling, no pain to palpation  minimal Left calf tenderness   Assessment/Plan: 1. 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  which require 3+ hours per day of interdisciplinary therapy in a comprehensive inpatient rehab setting.  Physiatrist is providing close team supervision and 24 hour management of active medical problems listed below.  Physiatrist and rehab team continue to assess barriers to discharge/monitor patient progress toward functional and medical goals  Care Tool:  Bathing    Body parts bathed by patient: Chest, Abdomen, Front perineal area, Right upper leg, Left upper leg, Buttocks, Face   Body  parts bathed by helper: Right lower leg, Left lower leg, Right arm, Left arm     Bathing assist Assist Level: Moderate Assistance - Patient 50 - 74%     Upper Body Dressing/Undressing Upper body dressing   What is the patient wearing?: Bra, Pull over shirt    Upper body assist Assist Level: Maximal Assistance - Patient 25 - 49%    Lower Body Dressing/Undressing Lower body dressing      What is the patient wearing?: Incontinence brief, Pants     Lower body assist Assist for lower body dressing: 2 Helpers     Toileting Toileting    Toileting assist Assist for toileting: Dependent - Patient 0%     Transfers Chair/bed transfer  Transfers assist     Chair/bed transfer assist level: 2 Helpers     Locomotion Ambulation   Ambulation assist   Ambulation activity did not occur: Safety/medical concerns  Assist level: Total Assistance - Patient < 25% Assistive device:  (wall rail) Max distance: 3 steps   Walk 10 feet activity   Assist  Walk 10 feet activity did not occur: Safety/medical concerns        Walk 50 feet activity   Assist Walk 50 feet with 2 turns activity did not occur: Safety/medical concerns         Walk 150 feet activity   Assist Walk 150 feet activity did not occur: Safety/medical concerns         Walk 10 feet on uneven surface  activity   Assist Walk 10 feet on uneven surfaces activity did not occur: Safety/medical concerns  Wheelchair     Assist Will patient use wheelchair at discharge?: Yes Type of Wheelchair: Manual    Wheelchair assist level: Dependent - Patient 0% Max wheelchair distance: 150    Wheelchair 50 feet with 2 turns activity    Assist        Assist Level: Dependent - Patient 0%   Wheelchair 150 feet activity     Assist      Assist Level: Dependent - Patient 0%   Blood pressure 126/78, pulse 74, temperature 98.1 F (36.7 C), resp. rate 16, weight 91 kg, SpO2 96  %.  Medical Problem List and Plan: 1. Left hemiparesis with right gaze preference, has difficulty tracking beyond midline to the left and continues to have limited verbal output secondary to large right MCA infarct.   Continue CIR, PT, OT, SLP  SNF  Pending FL-2 completed, family now willing to take home if unable to place     PRAFO/WHO qhs 2. Antithrombotics: -DVT/anticoagulation: Pharmaceutical: Lovenox -antiplatelet therapy: On ASA/Plavix 3. Pain Management: Tylenol prn. Sports creme Left calf tenderness as well as Right LE pain  C spine xray unremarkable  4. Mood: LCSW to follow for evaluation and support.  -antipsychotic agents: N/A 5. Neuropsych: This patient is not fully capable of making decisions on his own behalf. 6. Skin/Wound Care: Routine pressure relief measures 7. Fluids/Electrolytes/Nutrition: Monitor I/Os. Monitor I's and O's  8. HTN: Monitor BP tid--Imdur, lisinopril, cardizem and amlodipine has been on hold.  Cardizem 30 mg qid  Vitals:   02/05/20 0436 02/05/20 0605  BP: (!) 136/63 126/78  Pulse: 74   Resp: 16   Temp: 98.1 F (36.7 C)   SpO2: 96%    Monitor for trend prior to dose change  9. UTI: Resolved  10. COPD: Continue Pulmicort nebs bid. 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 To contact Dr. Katherina Right closer to discharge.  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  7/13: Upgrade to D2 thins based on swallow study 13.  Sleep disturbance?: Will monitor sleep wake cycle. Was on ambien and trazodone PTA? Ambien prn for now.   Improving overall  14. Azotemia due to dysphagia/elevated BUN   15. Hyperglycemia?  Resolved 16. Constipation: Resolved, hold on further PRN laxative 17.  Hx depression, has depressed affect which may be part of cognitive changes post CVA but will trial Lexapro 5mg  , re eval dose after 1 wk, still a bit tearful but sleeping,  eating and drinking  better   LOS: 28 days A FACE TO [place] Rocquel Askren 02/05/2020, 8:22 AM

## 2020-02-05 NOTE — Progress Notes (Signed)
Speech Language Pathology Weekly Progress Note  Patient Details  Name: Anne [name] MRN: [id number] Date of Birth: 09/15/1960  Beginning of progress report period: January 29, 2020 End of progress report period: February 05, 2020  Short Term Goals: Week 4: SLP Short Term Goal 1 (Week 4): STG=LTG due to remaining LOS SLP Short Term Goal 1 - Progress (Week 4): Not met    New Short Term Goals: Week 5: SLP Short Term Goal 1 (Week 5): STGs=LTGs due to ELOS  Weekly Progress Updates: Patient has made minimal gains this reporting period. Currently, patient is consuming Dys. 3 textures with thin liquids with minimal overt s/s of aspiration but requires overall Max A multimodal cues for use of swallowing compensatory strategies. Patient also requires more than a reasonable amount of time to complete meals due to severely impaired sustained attention. Max-Total A multimodal cues are also required for patient to complete functional and familiar tasks safely in regards to initiation, attention, problem solving, recall and awareness. Patient and family education ongoing. Patient would benefit from continued skilled SLP intervention to maximize her cognitive and swallowing function prior to discharge.      Intensity: Minumum of 1-2 x/day, 30 to 90 minutes Frequency: 3 to 5 out of 7 days Duration/Length of Stay: TBD, awaiting SNF offer vs home with family Treatment/Interventions: Cognitive remediation/compensation;Cueing hierarchy;Environmental controls;Dysphagia/aspiration precaution training;Internal/external aids;Functional tasks;Patient/family education;Speech/Language facilitation;Therapeutic Activities    [place], [place] 02/05/2020, 6:56 AM

## 2020-02-06 NOTE — Progress Notes (Signed)
[facility] PHYSICAL MEDICINE & REHABILITATION PROGRESS NOTE   Subjective/Complaints: Patient seen laying in bed this morning.  She states she slept well overnight.  She denies complaints.  ROS: Denies CP, SOB, N/V/D   Objective:   No results found. No results for input(s): WBC, HGB, HCT, PLT in the last 72 hours. No results for input(s): NA, K, CL, CO2, GLUCOSE, BUN, CREATININE, CALCIUM in the last 72 hours.  Intake/Output Summary (Last 24 hours) at 02/06/2020 1301 Last data filed at 02/06/2020 0953 Gross per 24 hour  Intake 825 ml  Output --  Net 825 ml     Physical Exam: Vital Signs Blood pressure 125/66, pulse 66, temperature 98.2 F (36.8 C), resp. rate 16, weight 91 kg, SpO2 98 %. Constitutional: No distress . Vital signs reviewed. HENT: Normocephalic.  Atraumatic. Eyes: EOMI. No discharge. Cardiovascular: No JVD. Respiratory: Normal effort.  No stridor. GI: Non-distended. Skin: Warm and dry.  Intact. Psych: Flat.. Musc: No edema in extremities.  No tenderness in extremities. Neuro: Alert Motor: LUE: 2-2+/5 proximal distal LLE: 2 -/5 proximal to distal  Assessment/Plan: 1. 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  which require 3+ hours per day of interdisciplinary therapy in a comprehensive inpatient rehab setting.  Physiatrist is providing close team supervision and 24 hour management of active medical problems listed below.  Physiatrist and rehab team continue to assess barriers to discharge/monitor patient progress toward functional and medical goals  Care Tool:  Bathing    Body parts bathed by patient: Chest, Abdomen, Front perineal area, Right upper leg, Left upper leg, Buttocks, Face   Body parts bathed by helper: Right lower leg, Left lower leg, Right arm, Left arm     Bathing assist Assist Level: Moderate Assistance - Patient 50 - 74%     Upper Body Dressing/Undressing Upper body dressing   What is the patient wearing?: Bra, Pull over  shirt    Upper body assist Assist Level: Maximal Assistance - Patient 25 - 49%    Lower Body Dressing/Undressing Lower body dressing      What is the patient wearing?: Incontinence brief, Pants     Lower body assist Assist for lower body dressing: 2 Helpers     Toileting Toileting    Toileting assist Assist for toileting: Dependent - Patient 0%     Transfers Chair/bed transfer  Transfers assist     Chair/bed transfer assist level: 2 Helpers     Locomotion Ambulation   Ambulation assist   Ambulation activity did not occur: Safety/medical concerns  Assist level: Total Assistance - Patient < 25% Assistive device:  (wall rail) Max distance: 3 steps   Walk 10 feet activity   Assist  Walk 10 feet activity did not occur: Safety/medical concerns        Walk 50 feet activity   Assist Walk 50 feet with 2 turns activity did not occur: Safety/medical concerns         Walk 150 feet activity   Assist Walk 150 feet activity did not occur: Safety/medical concerns         Walk 10 feet on uneven surface  activity   Assist Walk 10 feet on uneven surfaces activity did not occur: Safety/medical concerns         Wheelchair     Assist Will patient use wheelchair at discharge?: Yes Type of Wheelchair: Manual    Wheelchair assist level: Dependent - Patient 0% Max wheelchair distance: 150    Wheelchair 50 feet with  2 turns activity    Assist        Assist Level: Dependent - Patient 0%   Wheelchair 150 feet activity     Assist      Assist Level: Dependent - Patient 0%   Blood pressure 125/66, pulse 66, temperature 98.2 F (36.8 C), resp. rate 16, weight 91 kg, SpO2 98 %.  Medical Problem List and Plan: 1. Left hemiparesis with right gaze preference, has difficulty tracking beyond midline to the left and continues to have limited verbal output secondary to large right MCA infarct.   Continue CIR  SNF  Pending FL-2 completed,  family now willing to take home if unable to place   PRAFO/WHO qhs 2. Antithrombotics: -DVT/anticoagulation: Pharmaceutical: Lovenox -antiplatelet therapy: On ASA/Plavix 3. Pain Management: Tylenol prn. Sports creme Left calf tenderness as well as Right LE pain  C spine xray unremarkable   Controlled on 7/24 4. Mood: LCSW to follow for evaluation and support.  -antipsychotic agents: N/A 5. Neuropsych: This patient is not fully capable of making decisions on his own behalf. 6. Skin/Wound Care: Routine pressure relief measures 7. Fluids/Electrolytes/Nutrition: Monitor I/Os. Monitor I's and O's  8. HTN: Monitor BP tid--Imdur, lisinopril, cardizem and amlodipine has been on hold.  Cardizem 30 mg qid  Vitals:   02/06/20 0528 02/06/20 1140  BP: 126/70 125/66  Pulse:    Resp:    Temp:    SpO2:     Controlled on 7/24 9. UTI: Resolved  10. COPD: Continue Pulmicort nebs bid. 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 To contact Dr. Katherina Right closer to discharge.  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  Sleep disturbance?: Will monitor sleep wake cycle. Was on ambien and trazodone PTA? Ambien prn for now.   Improving  14. Azotemia due to dysphagia/elevated BUN   BUN elevated, but improving on 7/19  Encourage fluids 15. Hyperglycemia?  Resolved 16. Constipation: Resolved, hold on further PRN laxative 17.  Hx depression, has depressed affect which may be part of cognitive changes post CVA but will trial Lexapro 5mg  , re eval dose after 1 wk  LOS: 29 days A FACE TO FACE EVALUATION WAS PERFORMED  Murrell Elizondo [name] 02/06/2020, 1:01 PM

## 2020-02-07 ENCOUNTER — Inpatient Hospital Stay (HOSPITAL_COMMUNITY): Payer: Medicaid Other

## 2020-02-07 NOTE — Progress Notes (Signed)
Occupational Therapy Session Note  Patient Details  Name: Anne [name] MRN: [id number] Date of Birth: 1961/07/09  Today's Date: 02/07/2020 OT Individual Time: 1230-1335 OT Individual Time Calculation (min): 65 min    Short Term Goals: Week 4:  OT Short Term Goal 1 (Week 4): STGs=LTGs secondary to upcoming discharge  Skilled Therapeutic Interventions/Progress Updates:    Pt received supine with NT assisting with lunch supervision. OT took over supervision, beginning with giving pt top denture plate, as indicated on the SLP instructions. Pt required cueing for throat clearing, and to check left pocketing. Pt with productive cough x3 during meal. Pt was assisted in holding magic cup with her L hand (max HOH) in slight L of midline to encourage crossing midline and attention to L side of body. Distractions minimized. Pt with L pocketing when distractions were increased, requiring mod cueing to self manage. Pt finished eating and was agreeable to transfer to EOB. Pt required heavy cueing for technique and max A for LE management, mod A to lift trunk to transfer. Once EOB pt with L lean, requiring fluctuating min-mod A to maintain static sitting balance. Pt able to complete oral care and hair care with mod A overall. [name] LUE was placed into weightbearing position to reduce L lean. Pt intermittently c/o sudden shooting pain in her L hand/arm that resolved on its own. Pt returned to supine with max A and was positioned in sidelying. Bed alarm set and all needs met.   Therapy Documentation Precautions:  Precautions Precautions: Other (comment) Precaution Comments: L hemi Restrictions Weight Bearing Restrictions: No   Therapy/Group: Individual Therapy  Curtis Sites 02/07/2020, 7:18 AM

## 2020-02-07 NOTE — Progress Notes (Signed)
Anne [name] PHYSICAL MEDICINE & REHABILITATION PROGRESS NOTE   Subjective/Complaints: Patient seen laying in bed this morning.  She states she slept well overnight.  No reported issues.  She still sleepy this morning.  ROS: Denies CP, SOB, N/V/D   Objective:   No results found. No results for input(s): WBC, HGB, HCT, PLT in the last 72 hours. No results for input(s): NA, K, CL, CO2, GLUCOSE, BUN, CREATININE, CALCIUM in the last 72 hours.  Intake/Output Summary (Last 24 hours) at 02/07/2020 0917 Last data filed at 02/06/2020 1300 Gross per 24 hour  Intake 388 ml  Output --  Net 388 ml     Physical Exam: Vital Signs Blood pressure (!) 140/65, pulse 76, temperature 98.3 F (36.8 C), resp. rate 18, weight 91 kg, SpO2 96 %. Constitutional: No distress . Vital signs reviewed. HENT: Normocephalic.  Atraumatic. Eyes: EOMI. No discharge. Cardiovascular: No JVD.  RRR. Respiratory: Normal effort.  No stridor.  Bilateral clear to auscultation. GI: Non-distended.  BS +. Skin: Warm and dry.  Intact. Psych: Flat. Musc: No edema in extremities.  No tenderness in extremities. Neuro: Alert Motor: LUE: 2-2+/5 proximal distal, appears unchanged LLE: 2 -/5 proximal to distal  Assessment/Plan: 1. 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  which require 3+ hours per day of interdisciplinary therapy in a comprehensive inpatient rehab setting.  Physiatrist is providing close team supervision and 24 hour management of active medical problems listed below.  Physiatrist and rehab team continue to assess barriers to discharge/monitor patient progress toward functional and medical goals  Care Tool:  Bathing    Body parts bathed by patient: Chest, Abdomen, Front perineal area, Right upper leg, Left upper leg, Buttocks, Face   Body parts bathed by helper: Right lower leg, Left lower leg, Right arm, Left arm     Bathing assist Assist Level: Moderate Assistance - Patient 50 - 74%      Upper Body Dressing/Undressing Upper body dressing   What is the patient wearing?: Bra, Pull over shirt    Upper body assist Assist Level: Maximal Assistance - Patient 25 - 49%    Lower Body Dressing/Undressing Lower body dressing      What is the patient wearing?: Incontinence brief, Pants     Lower body assist Assist for lower body dressing: 2 Helpers     Toileting Toileting    Toileting assist Assist for toileting: Dependent - Patient 0%     Transfers Chair/bed transfer  Transfers assist     Chair/bed transfer assist level: 2 Helpers     Locomotion Ambulation   Ambulation assist   Ambulation activity did not occur: Safety/medical concerns  Assist level: Total Assistance - Patient < 25% Assistive device:  (wall rail) Max distance: 3 steps   Walk 10 feet activity   Assist  Walk 10 feet activity did not occur: Safety/medical concerns        Walk 50 feet activity   Assist Walk 50 feet with 2 turns activity did not occur: Safety/medical concerns         Walk 150 feet activity   Assist Walk 150 feet activity did not occur: Safety/medical concerns         Walk 10 feet on uneven surface  activity   Assist Walk 10 feet on uneven surfaces activity did not occur: Safety/medical concerns         Wheelchair     Assist Will patient use wheelchair at discharge?: Yes Type of Wheelchair: Manual  Wheelchair assist level: Dependent - Patient 0% Max wheelchair distance: 150    Wheelchair 50 feet with 2 turns activity    Assist        Assist Level: Dependent - Patient 0%   Wheelchair 150 feet activity     Assist      Assist Level: Dependent - Patient 0%   Blood pressure (!) 140/65, pulse 76, temperature 98.3 F (36.8 C), resp. rate 18, weight 91 kg, SpO2 96 %.  Medical Problem List and Plan: 1. Left hemiparesis with right gaze preference, has difficulty tracking beyond midline to the left and continues to have  limited verbal output secondary to large right MCA infarct.   Continue CIR  SNF  Pending FL-2 completed, family now willing to take home if unable to place   PRAFO/WHO qhs 2. Antithrombotics: -DVT/anticoagulation: Pharmaceutical: Lovenox -antiplatelet therapy: On ASA/Plavix 3. Pain Management: Tylenol prn. Sports creme Left calf tenderness as well as Right LE pain  C spine xray unremarkable   Controlled on 7/25 4. Mood: LCSW to follow for evaluation and support.  -antipsychotic agents: N/A 5. Neuropsych: This patient is not fully capable of making decisions on his own behalf. 6. Skin/Wound Care: Routine pressure relief measures 7. Fluids/Electrolytes/Nutrition: Monitor I/Os. Monitor I's and O's  8. HTN: Monitor BP tid--Imdur, lisinopril, cardizem and amlodipine has been on hold.  Cardizem 30 mg qid  Vitals:   02/06/20 1926 02/07/20 0437  BP: (!) 127/86 (!) 140/65  Pulse: 67 76  Resp: 18 18  Temp: 98.1 F (36.7 C) 98.3 F (36.8 C)  SpO2: 99% 96%   Relatively controlled on 7/25 9. UTI: Resolved  10. COPD: Continue Pulmicort nebs bid. 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 To contact Dr. Katherina Right closer to discharge.  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  Sleep disturbance?: Will monitor sleep wake cycle. Was on ambien and trazodone PTA? Ambien prn for now.   Improving  14. Azotemia due to dysphagia/elevated BUN   BUN elevated, but improving on 7/19, labs ordered for tomorrow  Encourage fluids 15. Hyperglycemia?  Resolved 16. Constipation: Resolved, hold on further PRN laxative 17.  Hx depression, has depressed affect which may be part of cognitive changes post CVA but will trial Lexapro 5mg  , re eval dose after 1 wk  LOS: 30 days A FACE TO FACE EVALUATION WAS PERFORMED  Anne [name] [name] 02/07/2020, 9:17 AM

## 2020-02-07 NOTE — Plan of Care (Signed)
  Problem: Consults Goal: RH STROKE PATIENT EDUCATION Description: See Patient Education module for education specifics  Outcome: Progressing   Problem: RH BOWEL ELIMINATION Goal: RH STG MANAGE BOWEL WITH ASSISTANCE Description: STG Manage Bowel with  [place]. Outcome: Progressing   Problem: RH BLADDER ELIMINATION Goal: RH STG MANAGE BLADDER WITH ASSISTANCE Description: STG Manage Bladder With  Min Assistance  Outcome: Progressing   Problem: RH SKIN INTEGRITY Goal: RH STG SKIN FREE OF INFECTION/BREAKDOWN Description: Free of breakdown with min assist Outcome: Progressing   Problem: RH SAFETY Goal: RH STG ADHERE TO SAFETY PRECAUTIONS W/ASSISTANCE/DEVICE Description: STG Adhere to Safety Precautions With Min Assistance/Device.  Outcome: Progressing   Problem: RH COGNITION-NURSING Goal: RH STG USES MEMORY AIDS/STRATEGIES W/ASSIST TO PROBLEM SOLVE Description: STG Uses Memory Aids/Strategies With Min Assistance to Problem Solve.  Outcome: Progressing   Problem: RH KNOWLEDGE DEFICIT Goal: RH STG INCREASE KNOWLEDGE OF HYPERTENSION Description: Patient/family will be able to describe how to manage hypertension with cues/handouts Outcome: Progressing Goal: RH STG INCREASE KNOWLEDGE OF DYSPHAGIA/FLUID INTAKE Description: Patient/family will be able to describe safe diet and swallowing techniques with cues handouts Outcome: Progressing Goal: RH STG INCREASE KNOWLEDGE OF STROKE PROPHYLAXIS Description: Patient/family will be able to describe how to prevent stroke including diet and medication management with cues/handouts Outcome: Progressing

## 2020-02-08 ENCOUNTER — Inpatient Hospital Stay (HOSPITAL_COMMUNITY): Payer: Medicaid Other

## 2020-02-08 ENCOUNTER — Ambulatory Visit (HOSPITAL_COMMUNITY): Payer: Medicaid Other | Admitting: Physical Therapy

## 2020-02-08 ENCOUNTER — Inpatient Hospital Stay (HOSPITAL_COMMUNITY): Payer: Medicaid Other | Admitting: Occupational Therapy

## 2020-02-08 ENCOUNTER — Encounter (HOSPITAL_COMMUNITY): Payer: Medicaid Other | Admitting: Occupational Therapy

## 2020-02-08 LAB — BASIC METABOLIC PANEL
Anion gap: 6 (ref 5–15)
BUN: 14 mg/dL (ref 6–20)
CO2: 22 mmol/L (ref 22–32)
Calcium: 10.5 mg/dL — ABNORMAL HIGH (ref 8.9–10.3)
Chloride: 107 mmol/L (ref 98–111)
Creatinine, Ser: 0.78 mg/dL (ref 0.44–1.00)
GFR calc Af Amer: >60 mL/min (ref >60)
GFR calc non Af Amer: >60 mL/min (ref >60)
Glucose, Bld: 84 mg/dL (ref 70–99)
Potassium: 4.1 mmol/L (ref 3.5–5.1)
Sodium: 135 mmol/L (ref 135–145)

## 2020-02-08 NOTE — Progress Notes (Signed)
[facility] PHYSICAL MEDICINE & REHABILITATION PROGRESS NOTE   Subjective/Complaints:   No issues overnite   ROS: Denies CP, SOB, N/V/D   Objective:   No results found. No results for input(s): WBC, HGB, HCT, PLT in the last 72 hours. Recent Labs    02/08/20 0711  NA 135  K 4.1  CL 107  CO2 22  GLUCOSE 84  BUN 14  CREATININE 0.78  CALCIUM 10.5*    Intake/Output Summary (Last 24 hours) at 02/08/2020 0902 Last data filed at 02/07/2020 1757 Gross per 24 hour  Intake 332 ml  Output --  Net 332 ml     Physical Exam: Vital Signs Blood pressure (!) 142/73, pulse 76, temperature (!) 97.5 F (36.4 C), temperature source Oral, resp. rate 17, weight 91 kg, SpO2 98 %. Constitutional: No distress . Vital signs reviewed. HENT: Normocephalic.  Atraumatic. Eyes: EOMI. No discharge. Cardiovascular: No JVD.  RRR. Respiratory: Normal effort.  No stridor.  Bilateral clear to auscultation. GI: Non-distended.  BS +. Skin: Warm and dry.  Intact. Psych: Flat. Musc: No edema in extremities.  No tenderness in extremities. Neuro: Alert Motor: LUE: 2-2+/5 proximal distal, appears unchanged LLE: 2 -/5 proximal to distal  Assessment/Plan: 1. 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  which require 3+ hours per day of interdisciplinary therapy in a comprehensive inpatient rehab setting.  Physiatrist is providing close team supervision and 24 hour management of active medical problems listed below.  Physiatrist and rehab team continue to assess barriers to discharge/monitor patient progress toward functional and medical goals  Care Tool:  Bathing    Body parts bathed by patient: Chest, Abdomen, Front perineal area, Right upper leg, Left upper leg, Buttocks, Face   Body parts bathed by helper: Right lower leg, Left lower leg, Right arm, Left arm     Bathing assist Assist Level: Moderate Assistance - Patient 50 - 74%     Upper Body Dressing/Undressing Upper body dressing    What is the patient wearing?: Bra, Pull over shirt    Upper body assist Assist Level: Maximal Assistance - Patient 25 - 49%    Lower Body Dressing/Undressing Lower body dressing      What is the patient wearing?: Incontinence brief, Pants     Lower body assist Assist for lower body dressing: 2 Helpers     Toileting Toileting    Toileting assist Assist for toileting: Dependent - Patient 0%     Transfers Chair/bed transfer  Transfers assist     Chair/bed transfer assist level: 2 Helpers     Locomotion Ambulation   Ambulation assist   Ambulation activity did not occur: Safety/medical concerns  Assist level: Total Assistance - Patient < 25% Assistive device:  (wall rail) Max distance: 3 steps   Walk 10 feet activity   Assist  Walk 10 feet activity did not occur: Safety/medical concerns        Walk 50 feet activity   Assist Walk 50 feet with 2 turns activity did not occur: Safety/medical concerns         Walk 150 feet activity   Assist Walk 150 feet activity did not occur: Safety/medical concerns         Walk 10 feet on uneven surface  activity   Assist Walk 10 feet on uneven surfaces activity did not occur: Safety/medical concerns         Wheelchair     Assist Will patient use wheelchair at discharge?: Yes Type of Wheelchair: Manual  Wheelchair assist level: Dependent - Patient 0% Max wheelchair distance: 150    Wheelchair 50 feet with 2 turns activity    Assist        Assist Level: Dependent - Patient 0%   Wheelchair 150 feet activity     Assist      Assist Level: Dependent - Patient 0%   Blood pressure (!) 142/73, pulse 76, temperature (!) 97.5 F (36.4 C), temperature source Oral, resp. rate 17, weight 91 kg, SpO2 98 %.  Medical Problem List and Plan: 1. Left hemiparesis with right gaze preference, has difficulty tracking beyond midline to the left and continues to have limited verbal output  secondary to large right MCA infarct.   Continue CIR , OT,SLP   SNF  Pending FL-2 completed, family now willing to take home if unable to place   PRAFO/WHO qhs 2. Antithrombotics: -DVT/anticoagulation: Pharmaceutical: Lovenox -antiplatelet therapy: On ASA/Plavix 3. Pain Management: Tylenol prn. Sports creme Left calf tenderness as well as Right LE pain  C spine xray unremarkable   Controlled on 7/26 4. Mood: LCSW to follow for evaluation and support.  -antipsychotic agents: N/A 5. Neuropsych: This patient is not fully capable of making decisions on his own behalf. 6. Skin/Wound Care: Routine pressure relief measures 7. Fluids/Electrolytes/Nutrition: Monitor I/Os. Monitor I's and O's  8. HTN: Monitor BP tid--Imdur, lisinopril, cardizem and amlodipine has been on hold.  Cardizem 30 mg qid  Vitals:   02/07/20 2302 02/08/20 0505  BP: (!) 138/70 (!) 142/73  Pulse:  76  Resp:  17  Temp:  (!) 97.5 F (36.4 C)  SpO2:  98%   Relatively controlled on 7/26 9. UTI: Resolved  10. COPD: Continue Pulmicort nebs bid. 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 To contact Dr. Katherina Right closer to discharge.  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  Sleep disturbance?: Will monitor sleep wake cycle. Was on ambien and trazodone PTA? Ambien prn for now.   Improving  14. Azotemia due to dysphagia/elevated BUN   BUN elevated, but improving on 7/19, labs ordered for tomorrow  Encourage fluids 15. Hyperglycemia?  Resolved 16. Constipation: Resolved, hold on further PRN laxative 17.  Hx depression, has depressed affect which may be part of cognitive changes post CVA but will trial Lexapro 5mg  , re eval dose this week   LOS: 31 days A FACE TO FACE EVALUATION WAS PERFORMED  Charlett Blake 02/08/2020, 9:02 AM

## 2020-02-08 NOTE — Plan of Care (Signed)
  Problem: Consults Goal: RH STROKE PATIENT EDUCATION Description: See Patient Education module for education specifics  Outcome: Progressing   Problem: RH BOWEL ELIMINATION Goal: RH STG MANAGE BOWEL WITH ASSISTANCE Description: STG Manage Bowel with  [place]. Outcome: Progressing   Problem: RH BLADDER ELIMINATION Goal: RH STG MANAGE BLADDER WITH ASSISTANCE Description: STG Manage Bladder With  Min Assistance  Outcome: Progressing   Problem: RH SKIN INTEGRITY Goal: RH STG SKIN FREE OF INFECTION/BREAKDOWN Description: Free of breakdown with min assist Outcome: Progressing   Problem: RH SAFETY Goal: RH STG ADHERE TO SAFETY PRECAUTIONS W/ASSISTANCE/DEVICE Description: STG Adhere to Safety Precautions With Min Assistance/Device.  Outcome: Progressing   Problem: RH COGNITION-NURSING Goal: RH STG USES MEMORY AIDS/STRATEGIES W/ASSIST TO PROBLEM SOLVE Description: STG Uses Memory Aids/Strategies With Min Assistance to Problem Solve.  Outcome: Progressing   Problem: RH KNOWLEDGE DEFICIT Goal: RH STG INCREASE KNOWLEDGE OF HYPERTENSION Description: Patient/family will be able to describe how to manage hypertension with cues/handouts Outcome: Progressing Goal: RH STG INCREASE KNOWLEDGE OF DYSPHAGIA/FLUID INTAKE Description: Patient/family will be able to describe safe diet and swallowing techniques with cues handouts Outcome: Progressing Goal: RH STG INCREASE KNOWLEDGE OF STROKE PROPHYLAXIS Description: Patient/family will be able to describe how to prevent stroke including diet and medication management with cues/handouts Outcome: Progressing

## 2020-02-08 NOTE — Progress Notes (Signed)
Occupational Therapy Session Note  Patient Details  Name: Anne [name] MRN: [id number] Date of Birth: July 09, 1961  Today's Date: 02/08/2020 OT Individual Time: 2426-8341 OT Individual Time Calculation (min): 9 min  and Today's Date: 02/08/2020 OT Missed Time: 60 Minutes Missed Time Reason: Patient fatigue   Short Term Goals: Week 4:  OT Short Term Goal 1 (Week 4): STGs=LTGs secondary to upcoming discharge  Skilled Therapeutic Interventions/Progress Updates:   Lab present when therapist enters the room. Pt is sleeping soundly and does not awaken when lab is drawing blood. OT attempting to wake pt for OT intervention but pt remains lethargic and unable to participate. Bed alarm activated and call bell within reach. 60 missed minutes secondary to patient fatigue.   Therapy Documentation Precautions:  Precautions Precautions: Other (comment) Precaution Comments: L hemi Restrictions Weight Bearing Restrictions: No General: General OT Amount of Missed Time: 60 Minutes Vital Signs: Therapy Vitals Temp: (!) 97.5 F (36.4 C) Temp Source: Oral Pulse Rate: 76 Resp: 17 BP: (!) 142/73 Patient Position (if appropriate): Lying Oxygen Therapy SpO2: 98 % O2 Device: Room Air   Therapy/Group: Individual Therapy  [name] 02/08/2020, 7:45 AM

## 2020-02-08 NOTE — Progress Notes (Signed)
Physical Therapy Session Note  Patient Details  Name: Anne [name] MRN: [id number] Date of Birth: 1960-08-06  Today's Date: 02/08/2020 PT Individual Time: 5956-3875 PT Individual Time Calculation (min): 31 min   Short Term Goals: Week 3:  PT Short Term Goal 1 (Week 3): Pt will perform WC mobility x 17f with min assist PT Short Term Goal 1 - Progress (Week 3): Not met PT Short Term Goal 2 (Week 3): [facility] PT Short Term Goal 2 - Progress (Week 3): Not met PT Short Term Goal 3 (Week 3): Pt will maintain sitting balance EOB with supervision assist up to 5 min PT Short Term Goal 3 - Progress (Week 3): Not met Week 4:  PT Short Term Goal 1 (Week 4): Pt will maintain sitting balance EOB with supervision assist up to 5 min PT Short Term Goal 2 (Week 4): [facility] PT Short Term Goal 3 (Week 4): pt to perform WC mobility for 50 ft at min A  Skilled Therapeutic Interventions/Progress Updates: Pt presented in bed with NT present for toileting. Pt with dry brief and NT left room. As PTA pulled down blanket PTA noted that pt's brief now wet. When asked pt states she was aware that her bladder was full however not express need to void. PTA removed brief and pt was able to front of peri are with wet washcloth. When pt rolled to side to remove brief PTA noted that pt actively having BM. At that moment pt's daughter entered room and obtained bed pan. Bedpan placed under pt and PTA and family left room to initiate family education and to allow time for pt to complete BM. Family education started with daughter and cousins. PTA explained to family that due to pt's inconsistent initiation and motivation safest to use hoyer at this time with family verbalizing understanding. PTA took family to rehab gym and explained hoyer lift. PTA explained level, sling, and mechanics of lift. PTA then had cousins operate sling with Anne [name], pt's dgt in  sling. Family required verbal cues and min reinforcement in use of lift. PTA demonstrated use of handle to position "patient" and cousins were able to transfer "pt" from mat to standard chair. Explained that following session will be more hands on with actual patient to have better understanding of rolling and pad placement. Due to limited time family returned to room. Upon arrival pt indicated no additiional BM while family out of room. As PTA obtained clean washcloths Anne [name], OT entered room for additional family ed. Pt and family handed off for remaining family ed.      Therapy Documentation Precautions:  Precautions Precautions: Other (comment) Precaution Comments: L hemi Restrictions Weight Bearing Restrictions: No General:   Vital Signs: Therapy Vitals Temp: 98.4 F (36.9 C) Pulse Rate: 99 Resp: 18 BP: (!) 157/65 Patient Position (if appropriate): Lying Oxygen Therapy SpO2: 96 % O2 Device: Room Air Pain: Pain Assessment Pain Score: 0-No pain Mobility:   Locomotion :    Trunk/Postural Assessment :    Balance:   Exercises:   Other Treatments:      Therapy/Group: Individual Therapy  Jen Eppinger 02/08/2020, 4:34 PM

## 2020-02-08 NOTE — Progress Notes (Signed)
Speech Language Pathology Daily Session Note  Patient Details  Name: Anne [name] MRN: [id number] Date of Birth: 1961/02/17  Today's Date: 02/08/2020 SLP Individual Time: 1002-1045 SLP Individual Time Calculation (min): 43 min  Short Term Goals: Week 5: SLP Short Term Goal 1 (Week 5): STGs=LTGs due to ELOS  Skilled Therapeutic Interventions: Skilled ST services focused on cognitive skills. SLP facilitated initiation, sustained attention and verbal output skills in simple verb picture description task, pt required Max A verbal cues for initiation and sustained attention (reduced alertness noted at 5 minute intervals) and min A verbal cues for accurate picture description at a phrase level. Pt demonstrated basic problem solving skills naming problems on picture cards with max A verbal cues for initiation. Pt demonstrated basic problem solving skills navigating remote to locate call bell, tv on/off button and volume, but required max A visual cues to scan left locating channel buttons and functional problem solving scanning channels. Pt was left in room with call bell within reach and bed alarm set. ST recommends to continue skilled ST services.      Pain Pain Assessment Pain Scale: 0-10 Pain Score: 0-No pain  Therapy/Group: Individual Therapy  [place]  [facility] 02/08/2020, 2:46 PM

## 2020-02-08 NOTE — Progress Notes (Signed)
Occupational Therapy Session Note  Patient Details  Name: Anne [name] MRN: [id number] Date of Birth: 04-Feb-1961  Today's Date: 02/08/2020 OT Individual Time: 8315-1761 OT Individual Time Calculation (min): 33 min    Short Term Goals: Week 4:  OT Short Term Goal 1 (Week 4): STGs=LTGs secondary to upcoming discharge  Skilled Therapeutic Interventions/Progress Updates:    Upon entering the room, pt transitioned from PT session without issue. Pt on bed pan and family members present in room. OT discussed time toileting at home, placement of bed pain for safety, and skin integrity concerns. Pt rolling L <> R with max A to remove bed pan. Pt had BM and needing total A to clean buttocks but pt performing peri hygiene with set up A. Sheets also noted to bed wet and pt rolling further to doff and change bed linens. OT discussed schedule for tomorrow with caregiver planning to attend as well. Pt remained supine in bed with all needs within reach. Bed alarm activated and call bell within reach.   Therapy Documentation Precautions:  Precautions Precautions: Other (comment) Precaution Comments: L hemi Restrictions Weight Bearing Restrictions: No Vital Signs: Therapy Vitals Temp: 98.4 F (36.9 C) Pulse Rate: 99 Resp: 18 BP: (!) 157/65 Patient Position (if appropriate): Lying Oxygen Therapy SpO2: 96 % O2 Device: Room Air Pain: Pain Assessment Pain Score: 0-No pain   Therapy/Group: Individual Therapy  [name] 02/08/2020, 4:27 PM

## 2020-02-08 NOTE — Progress Notes (Signed)
Patient ID: Anne [name], female   DOB: 1961-06-22, 59 y.o.   MRN: [id number]  Sw spoke with [facility] ([phone]) to discuss updates on insurance auth. Still waiting for approval. Reports if pt has not had COVID vaccine, there is no quarantine bed available until 8/5. If she has had vaccine, potential bed offer this Wednesday as there is a possible d/c.   SW spoke with Vilma Prader [phone]) to discuss referral. SW resent referral. SW explained exploring back up plan for pt in the event insurance approves for placement, but pt is unable to obtain a bed.   Loralee Pacas, MSW, [place] Office: [phone] Cell: [phone] Fax: [phone]

## 2020-02-09 ENCOUNTER — Encounter (HOSPITAL_COMMUNITY): Payer: Medicaid Other | Admitting: Occupational Therapy

## 2020-02-09 ENCOUNTER — Inpatient Hospital Stay (HOSPITAL_COMMUNITY): Payer: Medicaid Other | Admitting: Occupational Therapy

## 2020-02-09 ENCOUNTER — Inpatient Hospital Stay (HOSPITAL_COMMUNITY): Payer: Medicaid Other | Admitting: Physical Therapy

## 2020-02-09 ENCOUNTER — Encounter (HOSPITAL_COMMUNITY): Payer: Medicaid Other

## 2020-02-09 MED ORDER — ENSURE ENLIVE PO LIQD
237.0000 mL | Freq: Two times a day (BID) | ORAL | Status: DC
  Administered 2020-02-11 – 2020-02-16 (×11): 237 mL via ORAL

## 2020-02-09 MED ORDER — PROSOURCE PLUS PO LIQD
30.0000 mL | Freq: Two times a day (BID) | ORAL | Status: DC
  Administered 2020-02-09 – 2020-02-16 (×13): 30 mL via ORAL
  Filled 2020-02-09 (×11): qty 30

## 2020-02-09 NOTE — Progress Notes (Signed)
Physical Therapy Session Note  Patient Details  Name: Anne [name] MRN: [id number] Date of Birth: 10-08-1960  Today's Date: 02/09/2020 PT Individual Time: 1418-1450 PT Individual Time Calculation (min): 32 min   Short Term Goals: Week 4:  PT Short Term Goal 1 (Week 4): Pt will maintain sitting balance EOB with supervision assist up to 5 min PT Short Term Goal 2 (Week 4): Pt will transfer to [facility] with mod assist and LRAD PT Short Term Goal 3 (Week 4): pt to perform WC mobility for 50 ft at min A  Skilled Therapeutic Interventions/Progress Updates: Pt presented in w/c requesting to return to bed. Pt c/o RLE pain however out of "muscle rub", advised nsg who was working on obtaining new tube. Session focused on use of hoyer lift as per use upon d/c. Pt was set up with sling and transferred via [facility] lift to bed. Once in bed pt performed rolling L/R with maxA to R and modA to L ro remove pad. PTA then asked pt if brief was wet or dry with pt indicating wet. Pt performed additional rolling in same manner and noted that brief was dry however brief changed anyways due to feces on brief (no active BM noted). Pt required increased time and tacitile cues to initiate all bed mobility this session. Pt then requesting "coke" PTA placed bed in chair position and provided pt with cup of soda. Once completed pt remained in chair position and left with bed alarm on, call bell within reach and needs met.      Therapy Documentation Precautions:  Precautions Precautions: Other (comment) Precaution Comments: L hemi Restrictions Weight Bearing Restrictions: No General: PT Amount of Missed Time (min): 28 Minutes PT Missed Treatment Reason: Other (Comment) Vital Signs: Therapy Vitals Temp: 97.7 F (36.5 C) Pulse Rate: 93 Resp: 18 BP: (!) 140/71 Patient Position (if appropriate): Sitting Oxygen Therapy SpO2: 97 % O2 Device: Room Air Pain: Pain Assessment Pain Scale: 0-10 Pain Score: 0-No  pain Mobility:   Locomotion :    Trunk/Postural Assessment :    Balance:   Exercises:   Other Treatments:      Therapy/Group: Individual Therapy  Ranen Doolin 02/09/2020, 4:10 PM

## 2020-02-09 NOTE — Progress Notes (Signed)
[facility] PHYSICAL MEDICINE & REHABILITATION PROGRESS NOTE   Subjective/Complaints:   No issues overnite , pt sleeping but awakens to voice   ROS: Denies CP, SOB, N/V/D   Objective:   No results found. No results for input(s): WBC, HGB, HCT, PLT in the last 72 hours. Recent Labs    02/08/20 0711  NA 135  K 4.1  CL 107  CO2 22  GLUCOSE 84  BUN 14  CREATININE 0.78  CALCIUM 10.5*    Intake/Output Summary (Last 24 hours) at 02/09/2020 0758 Last data filed at 02/08/2020 1834 Gross per 24 hour  Intake 478 ml  Output --  Net 478 ml     Physical Exam: Vital Signs Blood pressure 125/77, pulse 68, temperature 99.2 F (37.3 C), temperature source Axillary, resp. rate 18, weight 91 kg, SpO2 93 %.  General: No acute distress Mood and affect are appropriate Heart: Regular rate and rhythm no rubs murmurs or extra sounds Lungs: Clear to auscultation, breathing unlabored, no rales or wheezes Abdomen: Positive bowel sounds, soft nontender to palpation, nondistended Extremities: No clubbing, cyanosis, or edema Skin: No evidence of breakdown, no evidence of rash  Motor: LUE: 2-2+/5 proximal distal, appears unchanged LLE: 2 -/5 proximal to distal  Assessment/Plan: 1. 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  which require 3+ hours per day of interdisciplinary therapy in a comprehensive inpatient rehab setting.  Physiatrist is providing close team supervision and 24 hour management of active medical problems listed below.  Physiatrist and rehab team continue to assess barriers to discharge/monitor patient progress toward functional and medical goals  Care Tool:  Bathing    Body parts bathed by patient: Chest, Abdomen, Front perineal area, Right upper leg, Left upper leg, Buttocks, Face   Body parts bathed by helper: Right lower leg, Left lower leg, Right arm, Left arm     Bathing assist Assist Level: Moderate Assistance - Patient 50 - 74%     Upper Body  Dressing/Undressing Upper body dressing   What is the patient wearing?: Bra, Pull over shirt    Upper body assist Assist Level: Maximal Assistance - Patient 25 - 49%    Lower Body Dressing/Undressing Lower body dressing      What is the patient wearing?: Incontinence brief, Pants     Lower body assist Assist for lower body dressing: 2 Helpers     Toileting Toileting    Toileting assist Assist for toileting: Dependent - Patient 0%     Transfers Chair/bed transfer  Transfers assist     Chair/bed transfer assist level: 2 Helpers     Locomotion Ambulation   Ambulation assist   Ambulation activity did not occur: Safety/medical concerns  Assist level: Total Assistance - Patient < 25% Assistive device:  (wall rail) Max distance: 3 steps   Walk 10 feet activity   Assist  Walk 10 feet activity did not occur: Safety/medical concerns        Walk 50 feet activity   Assist Walk 50 feet with 2 turns activity did not occur: Safety/medical concerns         Walk 150 feet activity   Assist Walk 150 feet activity did not occur: Safety/medical concerns         Walk 10 feet on uneven surface  activity   Assist Walk 10 feet on uneven surfaces activity did not occur: Safety/medical concerns         Wheelchair     Assist Will patient use wheelchair at discharge?:  Yes Type of Wheelchair: Manual    Wheelchair assist level: Dependent - Patient 0% Max wheelchair distance: 150    Wheelchair 50 feet with 2 turns activity    Assist        Assist Level: Dependent - Patient 0%   Wheelchair 150 feet activity     Assist      Assist Level: Dependent - Patient 0%   Blood pressure 125/77, pulse 68, temperature 99.2 F (37.3 C), temperature source Axillary, resp. rate 18, weight 91 kg, SpO2 93 %.  Medical Problem List and Plan: 1. Left hemiparesis with right gaze preference, has difficulty tracking beyond midline to the left and  continues to have limited verbal output secondary to large right MCA infarct.   Continue CIR , OT,SLP   SNF  Pending FL-2 completed, family now willing to take home if unable to place  Team conf in am   PRAFO/WHO qhs 2. Antithrombotics: -DVT/anticoagulation: Pharmaceutical: Lovenox -antiplatelet therapy: On ASA/Plavix 3. Pain Management: Tylenol prn. Sports creme Left calf tenderness as well as Right LE pain  C spine xray unremarkable   Controlled on 7/27 4. Mood: LCSW to follow for evaluation and support.  -antipsychotic agents: N/A 5. Neuropsych: This patient is not fully capable of making decisions on his own behalf. 6. Skin/Wound Care: Routine pressure relief measures 7. Fluids/Electrolytes/Nutrition: Monitor I/Os. Monitor I's and O's  8. HTN: Monitor BP tid--Imdur, lisinopril, cardizem and amlodipine has been on hold.  Cardizem 30 mg qid  Vitals:   02/08/20 2301 02/09/20 0451  BP: (!) 133/69 125/77  Pulse:  68  Resp:  18  Temp:  99.2 F (37.3 C)  SpO2:  93%   Relatively controlled on 7/27 9. UTI: Resolved  10. COPD: Continue Pulmicort nebs bid. 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 To contact Dr. Katherina Right closer to discharge.  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  Sleep disturbance?: Will monitor sleep wake cycle. Was on ambien and trazodone PTA? Ambien prn for now.   Improving  14. Azotemia due to dysphagia/elevated BUN   BUN elevated, but improving on 7/19, labs ordered for tomorrow  Encourage fluids 15. Hyperglycemia?  Resolved 16. Constipation: Resolved, hold on further PRN laxative 17.  Hx depression, has depressed affect which may be part of cognitive changes post CVA but will trial Lexapro 5mg  , re eval dose this week   LOS: 32 days A FACE TO FACE EVALUATION WAS PERFORMED  Charlett Blake 02/09/2020, 7:58 AM

## 2020-02-09 NOTE — Plan of Care (Signed)
  Problem: Consults Goal: RH STROKE PATIENT EDUCATION Description: See Patient Education module for education specifics  Outcome: Progressing   Problem: RH BOWEL ELIMINATION Goal: RH STG MANAGE BOWEL WITH ASSISTANCE Description: STG Manage Bowel with  [place]. Outcome: Progressing   Problem: RH BLADDER ELIMINATION Goal: RH STG MANAGE BLADDER WITH ASSISTANCE Description: STG Manage Bladder With  Min Assistance  Outcome: Progressing   Problem: RH SKIN INTEGRITY Goal: RH STG SKIN FREE OF INFECTION/BREAKDOWN Description: Free of breakdown with min assist Outcome: Progressing   Problem: RH SAFETY Goal: RH STG ADHERE TO SAFETY PRECAUTIONS W/ASSISTANCE/DEVICE Description: STG Adhere to Safety Precautions With Min Assistance/Device.  Outcome: Progressing   Problem: RH COGNITION-NURSING Goal: RH STG USES MEMORY AIDS/STRATEGIES W/ASSIST TO PROBLEM SOLVE Description: STG Uses Memory Aids/Strategies With Min Assistance to Problem Solve.  Outcome: Progressing   Problem: RH KNOWLEDGE DEFICIT Goal: RH STG INCREASE KNOWLEDGE OF HYPERTENSION Description: Patient/family will be able to describe how to manage hypertension with cues/handouts Outcome: Progressing Goal: RH STG INCREASE KNOWLEDGE OF DYSPHAGIA/FLUID INTAKE Description: Patient/family will be able to describe safe diet and swallowing techniques with cues handouts Outcome: Progressing Goal: RH STG INCREASE KNOWLEDGE OF STROKE PROPHYLAXIS Description: Patient/family will be able to describe how to prevent stroke including diet and medication management with cues/handouts Outcome: Progressing

## 2020-02-09 NOTE — Progress Notes (Signed)
Speech Language Pathology Daily Session Note  Patient Details  Name: Carmina Walle MRN: [id number] Date of Birth: 01-Dec-1960  Today's Date: 02/09/2020 SLP Individual Time: 1500-1530 SLP Individual Time Calculation (min): 30 min  Short Term Goals: Week 5: SLP Short Term Goal 1 (Week 5): STGs=LTGs due to ELOS  Skilled Therapeutic Interventions:Skilled ST services focused on swallow and education. [name] family arrived 10 minutes into 30 minute session for education. [name] family brought a bacon cheeseburger for pt to consume. SLP placed upper dentures and allowed pt to consume regular trial to highlight need for cuing pertaining to left buccal pocketing, cease verbalization during mastication and limit distractions. Pt required mod A verbal cues for swallow strategies leading to x2 coughing episodes during PO consumption trial. [name] returned demonstration and provided cues for swallow strategies. SLP provided education given hand out on recommended diet of dys 3 textures and thin liquids, swallow strategies (lingual sweeps, checking left buccal cavity between bites and especially at the end of meal, following liquids with solids every few bites and limit distractions) and cognitive impairments primarily due to reduced attention impacting awareness, problem solving and recall. All questions answered to satisfaction. Pt was left in room with family.      Pain Pain Assessment Pain Score: 0-No pain  Therapy/Group: Individual Therapy  Yuniel Blaney  [facility] 02/09/2020, 5:38 PM

## 2020-02-09 NOTE — Progress Notes (Addendum)
Physical Therapy Session Note  Patient Details  Name: Anne [name] MRN: [id number] Date of Birth: [birth year]  Today's Date: 02/09/2020 PT Individual Time: 9528-4132 PT Individual Time Calculation (min): 44 min   Short Term Goals: Week 1:  PT Short Term Goal 1 (Week 1): Pt will sit EOB with min assist x 5 mintues PT Short Term Goal 1 - Progress (Week 1): Met PT Short Term Goal 2 (Week 1): Pt will initiate gait training PT Short Term Goal 2 - Progress (Week 1): Met PT Short Term Goal 3 (Week 1): Pt will transfer to and from [facility] with mod assist +2 using SB. PT Short Term Goal 3 - Progress (Week 1): Progressing toward goal PT Short Term Goal 4 (Week 1): Pt will perform bed mobility with max assist of 1 PT Short Term Goal 4 - Progress (Week 1): Met Week 2:  PT Short Term Goal 1 (Week 2): Pt will transfer to and from med with max assist of 1 consistently PT Short Term Goal 1 - Progress (Week 2): Met PT Short Term Goal 2 (Week 2): Family will begin hands on education PT Short Term Goal 2 - Progress (Week 2): Progressing toward goal PT Short Term Goal 3 (Week 2): Pt will perform WC mobility with hemi technique with mod assist x 50 ft PT Short Term Goal 3 - Progress (Week 2): Met PT Short Term Goal 4 (Week 2): Pt will perform gait training with HW x 31f and max assist +2. PT Short Term Goal 4 - Progress (Week 2): Met Week 3:  PT Short Term Goal 1 (Week 3): Pt will perform WC mobility x 519fwith min assist PT Short Term Goal 1 - Progress (Week 3): Not met PT Short Term Goal 2 (Week 3): [facility] PT Short Term Goal 2 - Progress (Week 3): Not met PT Short Term Goal 3 (Week 3): Pt will maintain sitting balance EOB with supervision assist up to 5 min PT Short Term Goal 3 - Progress (Week 3): Not met Week 4:  PT Short Term Goal 1 (Week 4): Pt will maintain sitting balance EOB with supervision assist up to 5 min PT Short Term Goal 2 (Week 4): Pt [facility]  [facility] and LRAD PT Short Term Goal 3 (Week 4): pt to perform WC mobility for 50 ft at min A  Skilled Therapeutic Interventions/Progress Updates:    [facility]. [facility] for time management. Pt directed in x2 STS [facility], max A to come to standing with pt requiring majority of assistance to initiate stance and required L knee blocking. Pt then directed in gait training at rail with max A for stability and single step cues to complete with L knee blocking throughout for 15'. At this distance pt demonstrated no further initiation for advancing RLE and attempted to sit, max A for assisting pt to return to chair. Pt verbalized she did not want to walk anymore today. When asked pt said "yes" to working on sitting Edge of mat instead. Pt then taken in [facility] mat [facility], slide board transfer to mat table, total A x2 to complete to pt's R, directed in sitting at mat table in good posture with mirror feedback initially and RUE forward reaching for matching to encourage core strength and activation and to control posture at midline. Pt  required frequent VC for attention to task and CGA for dynamic sitting balance. Pt directed in slide board to pt's L to return to chair at total A x2 to complete and returned to room in [facility]. Pt left in WC at end of session, alarm set, All needs in reach and in good condition. Call light in hand.    Therapy Documentation Precautions:  Precautions Precautions: Other (comment) Precaution Comments: L hemi Restrictions Weight Bearing Restrictions: No General:   Vital Signs: Therapy Vitals Temp: 97.7 F (36.5 C) Pulse Rate: 93 Resp: 18 BP: (!) 140/71 Patient Position (if appropriate): Sitting Oxygen Therapy SpO2: 97 % O2 Device: Room Air Pain: Pain Assessment Pain Scale: 0-10 Pain Score: 0-No pain    Therapy/Group: Individual Therapy  Anne [name] 02/09/2020, 3:50 PM

## 2020-02-09 NOTE — Progress Notes (Signed)
Patient ID: Anne [name], female   DOB: 06/11/1961, 59 y.o.   MRN: [id number]  This SW covering for assigned SW, Erlene Quan.   SW spoke with [place] to get updates on insurance authorization. Still waiting on authorization. Reports that even if pt were accepted, bed offer 8/5, potential female bed offer this Wednesday if the family decides to take pt home. SW will follow-up with family.   SW called pt dtr Anne [name] to inform on above. She stated she would like [facility] if possible, if unable to accept, she would be willing to take pt home and see how well things go. Confirms family edu this week M/T/W 3pm-4pm. SW informed will continue to plan as if pt is d/c to home, and will request for DME delivery on Thursday via [place].   Loralee Pacas, MSW, [place] Office: [phone] Cell: [phone] Fax: [phone]

## 2020-02-09 NOTE — Progress Notes (Signed)
Occupational Therapy Session Note  Patient Details  Name: Anne [name] MRN: [id number] Date of Birth: 07/06/1961  Today's Date: 02/09/2020 OT Individual Time: 1478-2956 and 1535-1630 OT Individual Time Calculation (min): 45 min and 55 minutes   Short Term Goals: Week 4:  OT Short Term Goal 1 (Week 4): STGs=LTGs secondary to upcoming discharge  Skilled Therapeutic Interventions/Progress Updates:    Session 1: Upon entering the room, pt supine in bed with gaze fixed to the R. Pt scans to the L with greeting from therapist. Pt is not aware of incontinence and wet brief. Pt rolling L <> R with max A for therapist to doff brief and pt able to perform frontal peri hygiene and therapist assisting with washing buttocks. Total A to thread and pull up pants. Supine >sit to EOB with total A. Slide board transfer with total A and pt not giving any effort for transfer. Second person used to hold equipment for safety and to place slide board. Pt positioned in wheelchair at sink with total A for UB dressing with hemiplegic technique and set up A to brush teeth this session. Pt remains in wheelchair with chair alarm belt donned and call bell within reach upon exiting the room.   Session 2: Upon entering the room, pt supine in bed with caregivers present for continued hands on education. Caregivers rolled pt L <> R and placed hoyer sling with min cuing for direction. They were able to operate hoyer lift and transfer pt into wheelchair safely with increased time. OT demonstrated how to remove sling while pt in wheelchair and how to place it back around pt in order to return to bed with pt's daughter returning demonstrations accurately. Pt transferred back into bed with hoyer lift once again for additional practice. Pt rolled L <> R to remove sling and then pt' daughter changed patient's brief without assistance from therapist using good body mechanics. OT discussed positioning of bed at home to maintain body  mechanics as well. OT demonstrated how to don L resting hand splint and caregiver returned demonstrations with min cuing. OT provided caregivers with HEP for L UE and LE A/AROM and PROM for home use. OT reviewed with caregiver returning demonstrations. Caregiver to take home papers and review further at home and come with questions tomorrow if needed. Pt remained supine in bed with call bell and all needed items within reach.   Therapy Documentation Precautions:  Precautions Precautions: Other (comment) Precaution Comments: L hemi Restrictions Weight Bearing Restrictions: No   Therapy/Group: Individual Therapy  [name] 02/09/2020, 10:45 AM

## 2020-02-09 NOTE — Progress Notes (Signed)
Nutrition Follow-up  RD working remotely.  DOCUMENTATION CODES:   Obesity unspecified  INTERVENTION:   - Please obtain updated weight, last available weight is from 01/25/20  - Add Ensure Enlive po BID, each supplement provides 350 kcal and 20 grams of protein  -ContinueMagic CupTID with meals, each supplement provides 290 kcal and 9 grams of protein  -ProSource 30 ml po BID, each supplement provides 100 kcal and 15 grams of protein  - Continue MVI with minerals daily  NUTRITION DIAGNOSIS:   Inadequate oral intake related to dysphagia as evidenced by meal completion < 50%.  Progressing  GOAL:   Patient will meet greater than or equal to 90% of their needs  Progressing  MONITOR:   PO intake, Supplement acceptance, Diet advancement, Labs, Weight trends  REASON FOR ASSESSMENT:   Consult Calorie Count  ASSESSMENT:   59 yo female admitted to Rehab from [facility] S/P stroke with functional deficits. PMH includes CAD, coronary vasospasms, asthma, tobacco abuse.  6/17 - s/p thrombectomy 6/18 - Cortrak placed 6/24 - s/p MBS, diet advancedto dysphagia 1 with nectar-thick liquids 6/25 - transferred to CIR, transitioned to nocturnal TF 7/07 - Cortrak removed, nocturnal TF d/c 7/12 - s/p MBS, diet advanced to dysphagia 2 with thin liquids 7/19 - diet advanced to dysphagia 3  Noted target d/c date of 7/28 (SNF vs home with daughter).  Pt accepting 100% of Pro-Stat supplements per [facility] documentation. Given variable PO intake, will add Ensure Enlive BID.  No new weights since 01/25/20. Weight stable from 7/8 to 7/12.  Unable to reach pt via phone call to room.  Meal Completion: 10-85% x last 8 documented meals  Medications reviewed and include: Pro-Stat 30 ml BID, folic acid, MVI with minerals, thiamine, Megace 400 mg BID  Labs reviewed.  Diet Order:   Diet Order            DIET DYS 3 Room service appropriate? Yes; Fluid consistency: Thin  Diet effective 1000                  EDUCATION NEEDS:   Education needs have been addressed  Skin:  Skin Assessment: Reviewed RN Assessment  Last BM:  02/08/20  Height:   Ht Readings from Last 1 Encounters:  12/31/19 5\' 6"  (1.676 m)    Weight:   Wt Readings from Last 1 Encounters:  01/25/20 91 kg    Ideal Body Weight:  59.1 kg  BMI:  Body mass index is 32.38 kg/m.  Estimated Nutritional Needs:   Kcal:  1800-2000  Protein:  100-120 gm  Fluid:  >/= 1.8 L    Gaynell Face, MS, RD, LDN Inpatient Clinical Dietitian Please see AMiON for contact information.

## 2020-02-10 ENCOUNTER — Ambulatory Visit (HOSPITAL_COMMUNITY): Payer: Medicaid Other | Admitting: Physical Therapy

## 2020-02-10 ENCOUNTER — Encounter (HOSPITAL_COMMUNITY): Payer: Medicaid Other | Admitting: Occupational Therapy

## 2020-02-10 MED ORDER — ESCITALOPRAM OXALATE 10 MG PO TABS
10.0000 mg | ORAL_TABLET | Freq: Every day | ORAL | Status: DC
  Administered 2020-02-11 – 2020-02-16 (×6): 10 mg via ORAL
  Filled 2020-02-10 (×6): qty 1

## 2020-02-10 NOTE — Plan of Care (Signed)
  Problem: Consults Goal: RH STROKE PATIENT EDUCATION Description: See Patient Education module for education specifics  Outcome: Progressing   Problem: RH BOWEL ELIMINATION Goal: RH STG MANAGE BOWEL WITH ASSISTANCE Description: STG Manage Bowel with  [place]. Outcome: Progressing   Problem: RH BLADDER ELIMINATION Goal: RH STG MANAGE BLADDER WITH ASSISTANCE Description: STG Manage Bladder With  Min Assistance  Outcome: Progressing   Problem: RH SKIN INTEGRITY Goal: RH STG SKIN FREE OF INFECTION/BREAKDOWN Description: Free of breakdown with min assist Outcome: Progressing   Problem: RH SAFETY Goal: RH STG ADHERE TO SAFETY PRECAUTIONS W/ASSISTANCE/DEVICE Description: STG Adhere to Safety Precautions With Min Assistance/Device.  Outcome: Progressing   Problem: RH COGNITION-NURSING Goal: RH STG USES MEMORY AIDS/STRATEGIES W/ASSIST TO PROBLEM SOLVE Description: STG Uses Memory Aids/Strategies With Min Assistance to Problem Solve.  Outcome: Progressing   Problem: RH KNOWLEDGE DEFICIT Goal: RH STG INCREASE KNOWLEDGE OF HYPERTENSION Description: Patient/family will be able to describe how to manage hypertension with cues/handouts Outcome: Progressing Goal: RH STG INCREASE KNOWLEDGE OF DYSPHAGIA/FLUID INTAKE Description: Patient/family will be able to describe safe diet and swallowing techniques with cues handouts Outcome: Progressing Goal: RH STG INCREASE KNOWLEDGE OF STROKE PROPHYLAXIS Description: Patient/family will be able to describe how to prevent stroke including diet and medication management with cues/handouts Outcome: Progressing

## 2020-02-10 NOTE — Progress Notes (Signed)
Occupational Therapy Session Note  Patient Details  Name: Anne [name] MRN: [id number] Date of Birth: [birth year]  Today's Date: 02/10/2020 OT Individual Time: 0254-2706 OT Individual Time Calculation (min): 24 min    Short Term Goals: Week 3:  OT Short Term Goal 1 (Week 3): STGs=LTGs secondary to upcoming discharge OT Short Term Goal 1 - Progress (Week 3): Not met  Skilled Therapeutic Interventions/Progress Updates:    Upon entering the room, pt seated in wheelchair and transitioned from PT session. [name] family present for continued education. They had no further questions about HEP given yesterday. Pt requesting to change out of hospital gown. Focus on education for hemiplegic dressing technique. Pt needing max multimodal cuing and total A to don bra and t shirt. OT able to provide further information to caregivers about how attention and cognition effect pt's ability to perform these functional tasks. Pt becoming very tearful and eventually asks daughter for food. Pt remained in wheelchair with lap tray donned and chair alarm belt activated. 2 caregivers remaining present in room.   Therapy Documentation Precautions:  Precautions Precautions: Other (comment) Precaution Comments: L hemi Restrictions Weight Bearing Restrictions: No General:   Vital Signs: Therapy Vitals Pulse Rate: 101 Resp: 16 BP: (!) 133/82 Patient Position (if appropriate): Lying Oxygen Therapy SpO2: 99 % O2 Device: Room Air   Therapy/Group: Individual Therapy  [name] 02/10/2020, 5:29 PM

## 2020-02-10 NOTE — Progress Notes (Signed)
Physical Therapy Session Note  Patient Details  Name: Anne [name] MRN: [id number] Date of Birth: 15-Dec-1960  Today's Date: 02/10/2020 PT Individual Time: 1500-1540 PT Individual Time Calculation (min): 40 min   Short Term Goals: Week 4:  PT Short Term Goal 1 (Week 4): Pt will maintain sitting balance EOB with supervision assist up to 5 min PT Short Term Goal 2 (Week 4): Pt will transfer to [facility] with mod assist and LRAD PT Short Term Goal 3 (Week 4): pt to perform WC mobility for 50 ft at min A  Skilled Therapeutic Interventions/Progress Updates:   Pt received supine in bed asking to be placed on bed pan. PT encouraged use of BSC for toiletting, but pt decline. Rolling to the L with min assist to place [facility], heavy use of bed rails. Pt unable to void on bed pan and then agreeable to attempt use of BSC. Supine>sit with max assist. Stand pivot transfer to [facility] with LUE over therapist shoulder and mod-max assist once pt able to initiate movement. Daughters then present for family ED. Once on [facility], pt attempted to void bowels but unsuccessful. Sit<>stand x 3 from [facility] with mod assist overall and max cues for initiation of movement and cues for proper use of UE to push from arm rest. PT assisted with lower body dressing from [facility] with total A. Gait training with HW and max assist +2 for safety x 27ft. Pt noted to have quad activation in stance, but PT advanced the LLE and provided max cues for sequencing and safe use of AD. Pt left sitting in WC with OT present.       Therapy Documentation Precautions:  Precautions Precautions: Other (comment) Precaution Comments: L hemi Restrictions Weight Bearing Restrictions: No Vital Signs: Therapy Vitals Pulse Rate: 101 Resp: 16 BP: (!) 133/82 Patient Position (if appropriate): Lying Oxygen Therapy SpO2: 99 % O2 Device: Room Air Pain: denies  Therapy/Group: Individual Therapy  [name] 02/10/2020, 3:48 PM

## 2020-02-10 NOTE — Patient Care Conference (Signed)
Inpatient RehabilitationTeam Conference and Plan of Care Update Date: 02/10/2020   Time: 10:08    Patient Name: Anne [name]      Medical Record Number: [id number]  Date of Birth: 20-May-1961 Sex: Female         Room/Bed: 4W18C/4W18C-01 Payor Info: Payor:  [place] / Plan: [place] / Product Type: *No Product type* /    Admit Date/Time:  01/08/2020  4:39 PM  Primary Diagnosis:  Acute right MCA stroke [facility])  Hospital Problems: Principal Problem:   Acute right MCA stroke ([place]) Active Problems:   Elevated BUN   Sleep disturbance   Left foot pain   Hemiparesis affecting left side as late effect of stroke [facility])    Expected Discharge Date: Expected Discharge Date:  (SNFpending insurance/Home with daughter)  Team Members Present: Physician leading conference: Dr. Alysia Penna Care Coodinator Present: Dorien Chihuahua, RN, BSN, CRRN;Loralee Pacas, LCSWA Nurse Present: Toy Cookey, LPN PT Present: Barrie Folk, Katherine Roan, PT OT Present: Darleen Crocker, OT SLP Present: Charolett Bumpers, SLP PPS Coordinator present : Ileana Ladd, Burna Mortimer, SLP     Current Status/Progress Goal Weekly Team Focus  Bowel/Bladder   pt is incontinent of b/b, PVR per order. LBM 02/09/20  Regain control of bowel and bladder  Q2h toileting/PRN   Swallow/Nutrition/ Hydration   dys 3 and thin, Mod A  Mod A  swallow strategies and education   ADL's   Mod A bathing bedlevel, Max A UB dressing, +2 LB dressing, +2 Stedy transfer to [facility]  mod A overall  Self care retraining, balance, functional transfers, family education + d/c planning   Mobility             Communication   Min A  min A  eductaion and sue of speech strategies   Safety/Cognition/ Behavioral Observations  Max A  Max A  education, attention, problem solving and recall   Pain   pt c/o RLE pain, and lower back pain. Topical cream effective.  Decrease pain level to tolerable for patient   Assess pain qshift?prn   Skin   MASD to buttocks/groin  Prevent further breakdown and infection.  Assess skin qshift/prn     Team Discussion:  Discharge Planning/Teaching Needs:         Current Update:    Current Barriers to Discharge:  Decreased caregiver support, Incontinence and Insurance for SNF coverage  Possible Resolutions to Barriers: Added extra training days for the family education Toileting protocol continued SNF approval/denial pending review  Patient on target to meet rehab goals: yes, working on use of lift or slideboard for transfers and MOD-MAX assist goals Encourage patient to [place].  *See Care Plan and progress notes for long and short-term goals.   Revisions to Treatment Plan:  Home exercise program given to patient and family    Medical Summary                 I attest that I was present, lead the team conference, and concur with the assessment and plan of the team.   Dorien Chihuahua B 02/10/2020, 1:41 PM

## 2020-02-10 NOTE — Progress Notes (Signed)
[place] PHYSICAL MEDICINE & REHABILITATION PROGRESS NOTE   Subjective/Complaints: Remains depressed , inconsistent intake, family training in progress ROS: Denies CP, SOB, N/V/D   Objective:   No results found. No results for input(s): WBC, HGB, HCT, PLT in the last 72 hours. Recent Labs    02/08/20 0711  NA 135  K 4.1  CL 107  CO2 22  GLUCOSE 84  BUN 14  CREATININE 0.78  CALCIUM 10.5*    Intake/Output Summary (Last 24 hours) at 02/10/2020 1009 Last data filed at 02/10/2020 0923 Gross per 24 hour  Intake 198 ml  Output --  Net 198 ml     Physical Exam: Vital Signs Blood pressure 119/85, pulse 74, temperature 99.2 F (37.3 C), temperature source Oral, resp. rate 18, weight 86.4 kg, SpO2 97 %.   General: No acute distress Mood and affect are appropriate Heart: Regular rate and rhythm no rubs murmurs or extra sounds Lungs: Clear to auscultation, breathing unlabored, no rales or wheezes Abdomen: Positive bowel sounds, soft nontender to palpation, nondistended Extremities: No clubbing, cyanosis, or edema Skin: No evidence of breakdown, no evidence of rash  Motor: LUE: 2-2+/5 proximal distal, appears unchanged LLE: 2 -/5 proximal to distal  Assessment/Plan: 1. 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  which require 3+ hours per day of interdisciplinary therapy in a comprehensive inpatient rehab setting.  Physiatrist is providing close team supervision and 24 hour management of active medical problems listed below.  Physiatrist and rehab team continue to assess barriers to discharge/monitor patient progress toward functional and medical goals  Care Tool:  Bathing    Body parts bathed by patient: Chest, Abdomen, Front perineal area, Right upper leg, Left upper leg, Buttocks, Face   Body parts bathed by helper: Right lower leg, Left lower leg, Right arm, Left arm     Bathing assist Assist Level: Moderate Assistance - Patient 50 - 74%     Upper  Body Dressing/Undressing Upper body dressing   What is the patient wearing?: Bra, Pull over shirt    Upper body assist Assist Level: Maximal Assistance - Patient 25 - 49%    Lower Body Dressing/Undressing Lower body dressing      What is the patient wearing?: Incontinence brief, Pants     Lower body assist Assist for lower body dressing: Total Assistance - Patient < 25%     Toileting Toileting    Toileting assist Assist for toileting: Dependent - Patient 0%     Transfers Chair/bed transfer  Transfers assist     Chair/bed transfer assist level: 2 Helpers     Locomotion Ambulation   Ambulation assist   Ambulation activity did not occur: Safety/medical concerns  Assist level: Total Assistance - Patient < 25% Assistive device:  (wall rail) Max distance: 3 steps   Walk 10 feet activity   Assist  Walk 10 feet activity did not occur: Safety/medical concerns        Walk 50 feet activity   Assist Walk 50 feet with 2 turns activity did not occur: Safety/medical concerns         Walk 150 feet activity   Assist Walk 150 feet activity did not occur: Safety/medical concerns         Walk 10 feet on uneven surface  activity   Assist Walk 10 feet on uneven surfaces activity did not occur: Safety/medical concerns         Wheelchair     Assist Will patient use wheelchair at discharge?:  Yes Type of Wheelchair: Manual    Wheelchair assist level: Dependent - Patient 0% Max wheelchair distance: 150    Wheelchair 50 feet with 2 turns activity    Assist        Assist Level: Dependent - Patient 0%   Wheelchair 150 feet activity     Assist      Assist Level: Dependent - Patient 0%   Blood pressure 119/85, pulse 74, temperature 99.2 F (37.3 C), temperature source Oral, resp. rate 18, weight 86.4 kg, SpO2 97 %.  Medical Problem List and Plan: 1. Left hemiparesis with right gaze preference, has difficulty tracking beyond  midline to the left and continues to have limited verbal output secondary to large right MCA infarct.   Continue CIR , OT,SLP   SNF  Pending FL-2 completed, family now willing to take home if unable to place  Team conference today please see physician documentation under team conference tab, met with team  to discuss problems,progress, and goals. Formulized individual treatment plan based on medical history, underlying problem and comorbidities.   PRAFO/WHO qhs 2. Antithrombotics: -DVT/anticoagulation: Pharmaceutical: Lovenox -antiplatelet therapy: On ASA/Plavix 3. Pain Management: Tylenol prn. Sports creme Left calf tenderness as well as Right LE pain  C spine xray unremarkable   Controlled on 7/28 4. Mood: LCSW to follow for evaluation and support.  -antipsychotic agents: N/A 5. Neuropsych: This patient is not fully capable of making decisions on his own behalf. 6. Skin/Wound Care: Routine pressure relief measures 7. Fluids/Electrolytes/Nutrition: Monitor I/Os. Monitor I's and O's  8. HTN: Monitor BP tid--Imdur, lisinopril, cardizem and amlodipine has been on hold.  Cardizem 30 mg qid  Vitals:   02/10/20 0546 02/10/20 0903  BP: (!) 117/48 119/85  Pulse: 74   Resp:    Temp: 99.2 F (37.3 C)   SpO2: 97%    Relatively controlled on 7/28 9. UTI: Resolved  10. COPD: Continue Pulmicort nebs bid. 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 To contact Dr. Katherina Right closer to discharge.  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  Sleep disturbance?: Will monitor sleep wake cycle. Was on ambien and trazodone PTA? Ambien prn for now.   Improving  14. Azotemia due to dysphagia/elevated BUN   BUN elevated, but improving on 7/19, labs ordered for tomorrow  Encourage fluids 15. Hyperglycemia?  Resolved 16. Constipation: Resolved, hold on further PRN laxative 17.  Hx depression, has  depressed affect which may be part of cognitive changes post CVA but will trial Lexapro '5mg'$  , increase to '10mg'$   LOS: 33 days A FACE TO FACE EVALUATION WAS PERFORMED  Charlett Blake 02/10/2020, 10:09 AM

## 2020-02-11 ENCOUNTER — Inpatient Hospital Stay (HOSPITAL_COMMUNITY): Payer: Medicaid Other

## 2020-02-11 ENCOUNTER — Inpatient Hospital Stay (HOSPITAL_COMMUNITY): Payer: Medicaid Other | Admitting: Physical Therapy

## 2020-02-11 ENCOUNTER — Inpatient Hospital Stay (HOSPITAL_COMMUNITY): Payer: Medicaid Other | Admitting: Occupational Therapy

## 2020-02-11 NOTE — Progress Notes (Signed)
Occupational Therapy Session Note  Patient Details  Name: Anne [name] MRN: [id number] Date of Birth: 06-30-1961  Today's Date: 02/11/2020 OT Individual Time: 1000-1100 OT Individual Time Calculation (min): 60 min    Short Term Goals: Week 4:  OT Short Term Goal 1 (Week 4): STGs=LTGs secondary to upcoming discharge  Skilled Therapeutic Interventions/Progress Updates:    Upon entering the room, pt supine in bed with reports of headache but received medication prior to OT arrival and is agreeable to OT intervention. Pt requesting to get dressed and exit bed and into wheelchair. Pt incontinent of urine in brief and needing max A to roll L <> R for hygiene and total A for LB clothing management. Pt able to wash peri area with set up A and OT washed buttocks. Pt reports pain in R thigh and muscle rub applied per pt request. Supine >sit with total A to EOB. Pt needing min - mod A for sitting balance this session and total A of 2 slide board transfer to the R. UB bathing and dressing while seated in wheelchair at sink. OT attempting hand over hand technique with L UE but pt reports pain with this task. Total A for hemiplegic dressing technique to don bra and pull over shirt. Pt needing max multimodal cuing to attend to and locate L side for self care tasks. Pt remained in wheelchair with half lap tray donned and chair alarm belt donned. Call bell within reach and pt on phone as therapist exits the room.   Therapy Documentation Precautions:  Precautions Precautions: Other (comment) Precaution Comments: L hemi Restrictions Weight Bearing Restrictions: No   Therapy/Group: Individual Therapy  [name] 02/11/2020, 11:00 AM

## 2020-02-11 NOTE — Progress Notes (Signed)
Speech Language Pathology Daily Session Note  Patient Details  Name: Anne [name] MRN: [id number] Date of Birth: 10/29/1960  Today's Date: 02/11/2020 SLP Individual Time: 0901-0929 SLP Individual Time Calculation (min): 28 min  Short Term Goals: Week 5: SLP Short Term Goal 1 (Week 5): STGs=LTGs due to ELOS  Skilled Therapeutic Interventions: Skilled ST services focused on cognitive skills. SLP facilitated basic problem solving and sustained attention in card sorting task by two colors, pt required min A verbal cues for problem solving increasing to mod A verbal cues as sustained attention faded. Pt demonstrated sustained attention in 3 minute intervals then fading to requiring cues in 30 seconds to 1 minute intervals as the task continued. SLP attempted novel card task (WAR) in which pt was required to identify the highest card from a field of  2, but pt required max A verbal cues for initiation. Pt was left in room with call bell within reach and bed alarm set. ST recommends to continue skilled ST services.      Pain Pain Assessment Pain Score: 0-No pain  Therapy/Group: Individual Therapy  Leelah Hanna  [facility] 02/11/2020, 2:42 PM

## 2020-02-11 NOTE — Progress Notes (Signed)
Physical Therapy Session Note  Patient Details  Name: Anne [name] MRN: [id number] Date of Birth: [birth year]  Today's Date: 02/11/2020 PT Individual Time: 1301-1400 and 1791-5056 PT Individual Time Calculation (min): 59 min 43 min    Short Term Goals: Week 4:  PT Short Term Goal 1 (Week 4): Pt will maintain sitting balance EOB with supervision assist up to 5 min PT Short Term Goal 2 (Week 4): Pt will transfer to [facility] with mod assist and LRAD PT Short Term Goal 3 (Week 4): pt to perform WC mobility for 50 ft at min A  Skilled Therapeutic Interventions/Progress Updates:  Session 1   Pt received supine in bed and agreeable to PT. Pt noted to be on bed pan, but had not voided. Pt stated that she was done sitting on bed pan. Lower body dressing with max assist at bed level for roll and clothing management. Supine>sit transfer with mod assist and cues for use of UE and LE positioning. Sit<>stand from EOB to pull pants to waist with mod assist. Max assist stand pivot transfer to [facility] with LLE block and max cues for sequencing to perform proper step length in transfer.   Pt transported to to entrance of [place]. LE and UE NMR: LAQ with AAROM on the L, hip knee flexion/extension with AAROM on the L and max cues for initiate any movement on the L. LUE elbow flexion/extension x 10 each direction, chest press, retraction with elbow extension/flexion x 10 with max cues for attention and initiation of movement. Pt noted to have improved activation of triceps/chest with mild wrist/finger extension, but expresses pain in full finger extension.      WC mobility x 31f +562fwith max-mod assist from PT to force initiation of movement and constant attention to task. Pt demonstrates fluctuating attention to task of sequencing ove movements throughout, with intermittent use of RLE/RUE, but never simultaneously without manual facilitation from PT.   [facility] returned to [facility] and [facility]  and all needs met, and [facility]. .      Session 2.   Pt received sitting in WC and agreeable to PT. Pt transported to day room. Sustained attention.visual scanning task to complete low difficulty peg board puzzle with max cues for attention to reference picture as well as cues to problem solve and visually scann to the L to complete entire alternating puzzle. Pt then performed sit<>stand with mod assist after 4 attempts due to poor task initiation. Standing balance with no UE support to place all puzzle pieces back in bucket, min-mod assist to maintain balance while reaching for pegs due to increasing lateral LOB to the L, but no severe knee buckling on the LLE noted.   Pt returned to room and performed stand pivot transfer to bed with mod assist and increased time/cues to initiate movement of transfer. Once EOB sc[facility] and increased time to initiate anterior weight shift to clear glutes from bed. Sit>supine completed with min assist at the LLE and increased time/cues to initiate movement. Pt performed bridge and lateral scoot in supine to improve position with min-mod assist from PT to stabilize the LLE. Pt then left supine in bed with call bell in reach and all needs met.         Therapy Documentation Precautions:  Precautions Precautions: Other (comment) Precaution Comments: L hemi Restrictions Weight Bearing Restrictions: No Pain:  Denies at rest.    Therapy/Group: Individual Therapy  [name] 02/11/2020, 2:00 PM

## 2020-02-11 NOTE — Progress Notes (Signed)
[place] PHYSICAL MEDICINE & REHABILITATION PROGRESS NOTE   Subjective/Complaints: Eating well for breakfast some impulsivity noted during eating. ROS: Denies CP, SOB, N/V/D although mental status may limit accuracy of response   Objective:   No results found. No results for input(s): WBC, HGB, HCT, PLT in the last 72 hours. No results for input(s): NA, K, CL, CO2, GLUCOSE, BUN, CREATININE, CALCIUM in the last 72 hours.  Intake/Output Summary (Last 24 hours) at 02/11/2020 0920 Last data filed at 02/11/2020 0700 Gross per 24 hour  Intake 834 ml  Output --  Net 834 ml     Physical Exam: Vital Signs Blood pressure 126/69, pulse 65, temperature 98.4 F (36.9 C), temperature source Oral, resp. rate 16, weight 86.4 kg, SpO2 95 %.  General: No acute distress Mood and affect are appropriate Heart: Regular rate and rhythm no rubs murmurs or extra sounds Lungs: Clear to auscultation, breathing unlabored, no rales or wheezes Abdomen: Positive bowel sounds, soft nontender to palpation, nondistended Extremities: No clubbing, cyanosis, or edema Skin: No evidence of breakdown, no evidence of rash  Motor: LUE: 2-2+/5 proximal distal, appears unchanged LLE: 2 -/5 proximal to distal  Assessment/Plan: 1. 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  which require 3+ hours per day of interdisciplinary therapy in a comprehensive inpatient rehab setting.  Physiatrist is providing close team supervision and 24 hour management of active medical problems listed below.  Physiatrist and rehab team continue to assess barriers to discharge/monitor patient progress toward functional and medical goals  Care Tool:  Bathing    Body parts bathed by patient: Chest, Abdomen, Front perineal area, Right upper leg, Left upper leg, Buttocks, Face   Body parts bathed by helper: Right lower leg, Left lower leg, Right arm, Left arm     Bathing assist Assist Level: Moderate Assistance - Patient 50 -  74%     Upper Body Dressing/Undressing Upper body dressing   What is the patient wearing?: Bra, Pull over shirt    Upper body assist Assist Level: Total Assistance - Patient < 25%    Lower Body Dressing/Undressing Lower body dressing      What is the patient wearing?: Incontinence brief, Pants     Lower body assist Assist for lower body dressing: Total Assistance - Patient < 25%     Toileting Toileting    Toileting assist Assist for toileting: Dependent - Patient 0%     Transfers Chair/bed transfer  Transfers assist     Chair/bed transfer assist level: 2 Helpers     Locomotion Ambulation   Ambulation assist   Ambulation activity did not occur: Safety/medical concerns  Assist level: Total Assistance - Patient < 25% Assistive device:  (wall rail) Max distance: 3 steps   Walk 10 feet activity   Assist  Walk 10 feet activity did not occur: Safety/medical concerns        Walk 50 feet activity   Assist Walk 50 feet with 2 turns activity did not occur: Safety/medical concerns         Walk 150 feet activity   Assist Walk 150 feet activity did not occur: Safety/medical concerns         Walk 10 feet on uneven surface  activity   Assist Walk 10 feet on uneven surfaces activity did not occur: Safety/medical concerns         Wheelchair     Assist Will patient use wheelchair at discharge?: Yes Type of Wheelchair: Manual    Wheelchair assist  level: Dependent - Patient 0% Max wheelchair distance: 150    Wheelchair 50 feet with 2 turns activity    Assist        Assist Level: Dependent - Patient 0%   Wheelchair 150 feet activity     Assist      Assist Level: Dependent - Patient 0%   Blood pressure 126/69, pulse 65, temperature 98.4 F (36.9 C), temperature source Oral, resp. rate 16, weight 86.4 kg, SpO2 95 %.  Medical Problem List and Plan: 1. Left hemiparesis with right gaze preference, has difficulty tracking  beyond midline to the left and continues to have limited verbal output secondary to large right MCA infarct.   Continue CIR , OT,SLP   SNF  Pending FL-2 completed, family now willing to take home if unable to place     PRAFO/WHO qhs 2. Antithrombotics: -DVT/anticoagulation: Pharmaceutical: Lovenox -antiplatelet therapy: On ASA/Plavix 3. Pain Management: Tylenol prn. Sports creme Left calf tenderness as well as Right LE pain  C spine xray unremarkable   Controlled on 7/29 4. Mood: LCSW to follow for evaluation and support.  -antipsychotic agents: N/A 5. Neuropsych: This patient is not fully capable of making decisions on his own behalf. 6. Skin/Wound Care: Routine pressure relief measures 7. Fluids/Electrolytes/Nutrition: Monitor I/Os. Monitor I's and O's  8. HTN: Monitor BP tid--Imdur, lisinopril, cardizem and amlodipine has been on hold.  Cardizem 30 mg qid  Vitals:   02/11/20 0440 02/11/20 0642  BP: 126/65 126/69  Pulse: 80 65  Resp:    Temp: 98.4 F (36.9 C)   SpO2: 95%    Relatively controlled on 7/28 9. UTI: Resolved  10. COPD: Continue Pulmicort nebs bid. 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 To contact Dr. Katherina Right closer to discharge.  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  Sleep disturbance?: Will monitor sleep wake cycle. Was on ambien and trazodone PTA? Ambien prn for now.   Improving  14. Azotemia due to dysphagia/elevated BUN   Resolved, normal metabolic package 4/23 15. Hyperglycemia?  Resolved 16. Constipation: Resolved, hold on further PRN laxative 17.  Hx depression, has depressed affect which may be part of cognitive changes post CVA but will trial Lexapro 5mg  , increase to 10mg , monitor effect   LOS: 34 days A FACE TO FACE EVALUATION WAS PERFORMED  Charlett Blake 02/11/2020, 9:20 AM

## 2020-02-11 NOTE — Progress Notes (Signed)
This nurse attempted to administer the pts morning medication. Pt was alert, set up in the bed, and drank multiple sips of water. Pt refused to take the tylenol and cardizem that was crushed in the apple sauce. Pt was educated on the importance of taking these medications and continued to refuse. Pt would turn her head in the opposite direction when this nurse was attempting to feed her.   Previous encounters in this nurses shift, the pt required a lot of education and cuing to take the ordered medication. Pt refused to give this nurse a reason for refusing the medication.

## 2020-02-11 NOTE — Plan of Care (Signed)
  Problem: RH BOWEL ELIMINATION Goal: RH STG MANAGE BOWEL WITH ASSISTANCE Description: STG Manage Bowel with  Min Assistance. Outcome: Not Progressing; incontinence   Problem: RH BLADDER ELIMINATION Goal: RH STG MANAGE BLADDER WITH ASSISTANCE Description: STG Manage Bladder With  Min Assistance  Outcome: Not Progressing; incontinence   Problem: RH KNOWLEDGE DEFICIT Goal: RH STG INCREASE KNOWLEDGE OF HYPERTENSION Description: Patient/family will be able to describe how to manage hypertension with cues/handouts Outcome: Not Progressing; delayed response   Problem: RH KNOWLEDGE DEFICIT Goal: RH STG INCREASE KNOWLEDGE OF STROKE PROPHYLAXIS Description: Patient/family will be able to describe how to prevent stroke including diet and medication management with cues/handouts Outcome: Not Progressing; delayed response

## 2020-02-12 ENCOUNTER — Inpatient Hospital Stay (HOSPITAL_COMMUNITY): Payer: Medicaid Other | Admitting: Occupational Therapy

## 2020-02-12 ENCOUNTER — Inpatient Hospital Stay (HOSPITAL_COMMUNITY): Payer: Medicaid Other | Admitting: Speech Pathology

## 2020-02-12 ENCOUNTER — Inpatient Hospital Stay (HOSPITAL_COMMUNITY): Payer: Medicaid Other | Admitting: Physical Therapy

## 2020-02-12 ENCOUNTER — Inpatient Hospital Stay (HOSPITAL_COMMUNITY): Payer: Medicaid Other

## 2020-02-12 NOTE — Progress Notes (Signed)
Speech Language Pathology Daily Session Note  Patient Details  Name: Anne [name] MRN: [id number] Date of Birth: [birth year]  Today's Date: 02/12/2020 SLP Individual Time: 1420-1500 SLP Individual Time Calculation (min): 40 min  Short Term Goals: Week 5: SLP Short Term Goal 1 (Week 5): STGs=LTGs due to ELOS SLP Short Term Goal 1 - Progress (Week 5): Not met  Skilled Therapeutic Interventions:  Pt was seen for skilled ST targeting cognitive goals and dysphagia goals.  Therapist facilitated the session with a functional snack of dys 3 textures and thin liquids to assess toleration of current diet.  Pt consumed snack with min cues for use of swallow precautions and no overt s/s of aspiration.  Noted CSW note re: plan for pt to discharge home with her daughter.  When asked what pt will need help with at home, pt responded  "lots of stuff" with more than a reasonable amount of time to accommodate increased response latency.  Pt was unable to identify specific activities for which she will need assistance despite max assist question cues.  Pt was left in bed with bed alarm set and call bell within reach.  Continue per current plan of care.    Pain Pain Assessment Pain Scale: 0-10 Pain Score: 0-No pain  Therapy/Group: Individual Therapy  Johniece Hornbaker, Selinda Orion 02/12/2020, 4:55 PM

## 2020-02-12 NOTE — Progress Notes (Signed)
Physical Therapy Weekly Progress Note  Patient Details  Name: Anne [name] MRN: [id number] Date of Birth: 11-24-1960  Beginning of progress report period: February 01, 2020 End of progress report period: February 12, 2020  Today's Date: 02/12/2020 PT Individual Time:  800- 915a 75 minutes      Patient has met 6 of 14 short term goals.   Patient continues to demonstrate the following deficits muscle weakness and muscle paralysis, decreased cardiorespiratoy endurance, impaired timing and sequencing, unbalanced muscle activation, motor apraxia, ataxia, decreased coordination and decreased motor planning, decreased visual perceptual skills, decreased midline orientation, decreased attention to left, left side neglect, decreased motor planning and ideational apraxia, decreased initiation, decreased attention, decreased awareness, decreased problem solving, decreased safety awareness, decreased memory and delayed processing and decreased sitting balance, decreased standing balance, decreased postural control, hemiplegia and decreased balance strategies and therefore will continue to benefit from skilled PT intervention to increase functional independence with mobility.  Patient progressing toward long term goals..  Continue plan of care.  PT Short Term Goals Week 1:  PT Short Term Goal 1 (Week 1): Pt will sit EOB with min assist x 5 mintues PT Short Term Goal 1 - Progress (Week 1): Met PT Short Term Goal 2 (Week 1): Pt will initiate gait training PT Short Term Goal 2 - Progress (Week 1): Met PT Short Term Goal 3 (Week 1): Pt will transfer to and from [facility] with mod assist +2 using SB. PT Short Term Goal 3 - Progress (Week 1): Progressing toward goal PT Short Term Goal 4 (Week 1): Pt will perform bed mobility with max assist of 1 PT Short Term Goal 4 - Progress (Week 1): Met Week 2:  PT Short Term Goal 1 (Week 2): Pt will transfer to and from med with max assist of 1 consistently PT Short Term Goal 1 -  Progress (Week 2): Met PT Short Term Goal 2 (Week 2): Family will begin hands on education PT Short Term Goal 2 - Progress (Week 2): Progressing toward goal PT Short Term Goal 3 (Week 2): Pt will perform WC mobility with hemi technique with mod assist x 50 ft PT Short Term Goal 3 - Progress (Week 2): Met PT Short Term Goal 4 (Week 2): Pt will perform gait training with HW x 39f and max assist +2. PT Short Term Goal 4 - Progress (Week 2): Met Week 3:  PT Short Term Goal 1 (Week 3): Pt will perform WC mobility x 582fwith min assist PT Short Term Goal 1 - Progress (Week 3): Not met PT Short Term Goal 2 (Week 3): [facility] and LRAD PT Short Term Goal 2 - Progress (Week 3): Not met PT Short Term Goal 3 (Week 3): Pt will maintain sitting balance EOB with supervision assist up to 5 min PT Short Term Goal 3 - Progress (Week 3): Not met Week 4:  PT Short Term Goal 1 (Week 4): Pt will maintain sitting balance EOB with supervision assist up to 5 min PT Short Term Goal 2 (Week 4): [facility] PT Short Term Goal 3 (Week 4): pt to perform WC mobility for 50 ft at min A Week 5:  PT Short Term Goal 1 (Week 5): Pt will maintain sitting balance EOB with supervision assist up to 5 min PT Short Term Goal 2 (Week 5): [facility] and LRAD PT Short  Term Goal 3 (Week 5): pt to perform WC mobility for 50 ft at min A  Skilled Therapeutic Interventions/Progress Updates:  Ambulation/gait training;Community reintegration;DME/adaptive equipment instruction;Neuromuscular re-education;Psychosocial support;Stair training;Wheelchair propulsion/positioning;UE/LE Strength taining/ROM;UE/LE Coordination activities;Therapeutic Activities;Skin care/wound management;Pain management;Functional electrical stimulation;Discharge planning;Balance/vestibular training;Cognitive remediation/compensation;Disease management/prevention;Patient/family  education;Splinting/orthotics;Functional mobility training;Therapeutic Exercise;Visual/perceptual remediation/compensation   Session: pt received in bed and agreeable to therapy. Pt denied pain at start and end of session. Pt found in poor position in bed, sleeping soundly, pt awoke to name, agreeable to eating breakfast. Pt required max A x2 for upward scooting in bed after attempting to use hospital bed for improved positioning to allow pt to push with BLE, pull with RUE at head of bed to increase pt I with repositioning, pt however unable to complete this and ultimately required max A x2. Bed placed in chair position to improve eating position after pt denied to transfer to [facility] to eat. Pt tray placed in front of her, setup for breakfast by therapist, and tray items positioned to improve functional Independence as well as increase therapeutic challenge of visually scanning L for food items and utensils, increasing awareness to L side of body and environment, and increase pt's attention to task. Pt required max VC for awareness and attention to task, frequently stopping after bites and returning to rest position of head turn to R and no initiation to take bites of food, with VC for initiation pt would agree to eat and required an additional VC for bringing food to mouth or a tactile cue as well. No coughing noted, dentures in place prior to eating food, in good position and pt reported feeling comfortable. Once finished eating, pt agreeable to getting dressed and transferring to [facility]. Pt total assist for LE dressing for time management, and max A for shirt donning, total assist for bra at EOB. Pt transferred with max A to sitting EOB on pt's R side, max VC for initiate however ineffective, pt improved in participation with manual facilitation to initiate. Pt sat EOB for several minutes for dressing and face washing at CGA, VC for midline trunk position. Squat pivot to WC at pt's R mod Ax1 with L knee blocking  provided x3 attempts d/t poor initiation however pt did reach to armrest and assist in transfer with taking a step to R with RLE to chair. Pt taken to gym in [facility] for time management.  Pt directed in x2 STS at table at max A for initiation and to come to full standing and extra time required to allow pt time to control trunk into extension and activate glutes. Pt stood at table with mod A for 5 mins with visual scanning attempted in standing to encourage pt to scan environment to L, pt able to scan but required max VC. Pt directed in seated forward trunk reaching for block building with RUE to improve anterior weight shifting and attention to task. Pt required max VC for this but able to follow 75% commands to participate, reach forward and place at targeted areas. Attempted to direct pt in [facility] mobility however pt reported she needed to urinate. Pt returned to room and directed in squat pivot to WC mod Ax1 and sit>supine max A x1, rolling to R max Ax1 to place bed pan. Pt left in supine 4 rails up, alarm on with nursing tech, call light in reach.   Therapy Documentation Precautions:  Precautions Precautions: Other (comment) Precaution Comments: L hemi Restrictions Weight Bearing Restrictions: No  Therapy/Group: Individual Therapy  Junie Panning 02/12/2020, 3:35 PM

## 2020-02-12 NOTE — Plan of Care (Signed)
  Problem: Consults Goal: RH STROKE PATIENT EDUCATION Description: See Patient Education module for education specifics  Outcome: Progressing   Problem: RH BOWEL ELIMINATION Goal: RH STG MANAGE BOWEL WITH ASSISTANCE Description: STG Manage Bowel with  [place]. Outcome: Progressing   Problem: RH BLADDER ELIMINATION Goal: RH STG MANAGE BLADDER WITH ASSISTANCE Description: STG Manage Bladder With  Min Assistance  Outcome: Progressing   Problem: RH SKIN INTEGRITY Goal: RH STG SKIN FREE OF INFECTION/BREAKDOWN Description: Free of breakdown with min assist Outcome: Progressing   Problem: RH SAFETY Goal: RH STG ADHERE TO SAFETY PRECAUTIONS W/ASSISTANCE/DEVICE Description: STG Adhere to Safety Precautions With Min Assistance/Device.  Outcome: Progressing   Problem: RH COGNITION-NURSING Goal: RH STG USES MEMORY AIDS/STRATEGIES W/ASSIST TO PROBLEM SOLVE Description: STG Uses Memory Aids/Strategies With Min Assistance to Problem Solve.  Outcome: Progressing   Problem: RH KNOWLEDGE DEFICIT Goal: RH STG INCREASE KNOWLEDGE OF HYPERTENSION Description: Patient/family will be able to describe how to manage hypertension with cues/handouts Outcome: Progressing Goal: RH STG INCREASE KNOWLEDGE OF DYSPHAGIA/FLUID INTAKE Description: Patient/family will be able to describe safe diet and swallowing techniques with cues handouts Outcome: Progressing Goal: RH STG INCREASE KNOWLEDGE OF STROKE PROPHYLAXIS Description: Patient/family will be able to describe how to prevent stroke including diet and medication management with cues/handouts Outcome: Progressing

## 2020-02-12 NOTE — Progress Notes (Signed)
Speech Language Pathology Weekly Progress and Session Note  Patient Details  Name: Anne [name] MRN: [id number] Date of Birth: 1960-12-09  Beginning of progress report period:  02/05/2020 End of progress report period: 02/12/2020  Today's Date: 02/12/2020 SLP Individual Time: 1420-1500 SLP Individual Time Calculation (min): 40 min  Short Term Goals: Week 5: SLP Short Term Goal 1 (Week 5): STGs=LTGs due to ELOS SLP Short Term Goal 1 - Progress (Week 5): Not met    New Short Term Goals: Week 6: SLP Short Term Goal 1 (Week 6): STG=LTG due to remaining LOS  Weekly Progress Updates:   Pt has made limited gains this reporting period and continues to work towards overall mod assist LTG.  Pt is currently max assist for tasks due to significant cognitive impairment.  Pt has demonstrated improved toleration of diet advancement.  Pt is consuming dys 3, thin liquids diet with min cues for use of swallowing precautions.  Pt and family education is ongoing.  Pt would continue to benefit from skilled ST while inpatient in order to maximize functional independence and reduce burden of care prior to discharge.  Anticipate that pt will need 24/7 supervision at discharge in addition to Early follow up at next level of care    Intensity: Minumum of 1-2 x/day, 30 to 90 minutes Frequency: 3 to 5 out of 7 days Duration/Length of Stay: TBD, awaiting SNF offer vs home with family Treatment/Interventions: Cognitive remediation/compensation;Cueing hierarchy;Environmental controls;Dysphagia/aspiration precaution training;Internal/external aids;Functional tasks;Patient/family education;Speech/Language facilitation;Therapeutic Activities    Anne [name], Selinda Orion 02/12/2020, 4:59 PM

## 2020-02-12 NOTE — Progress Notes (Addendum)
Patient ID: Anne [name], female   DOB: 1960/12/23, 59 y.o.   MRN: [id number]  This SW covering for primary  Anchor.   02/11/2020- SW spoke with SW at [facility] who was covering for Irven Shelling to get updates on insurance authorization. She stated that there were no updates yet, and would have her follow-up with SW tomorrow.  SW spoke with Chris/[facility] [phone]) to discuss SNF option now that pt has full Medicaid. Questions with regard to if pt Medicaid was standard or managed care. SW informed pt has Managed Medicaid plan. They are not currently contracted with any of the managed Medicaid plans and would be unable to extend a bed offer.   02/12/2020-SW follow-up with [facility] ([phone]) to get updates on insurance auth. Reports that insurance approved pt for 30 days, however, they do not have a bed available until 8/5. Will follow-up if pt can admit from home per insurance if needed since she has a managed Medicaid plan.   SW called pt dtr [name] [phone]) to provide above updates. When discussing discharge plan, she is prepared to take patient home. SW dicussed family edu tomorrow 9am-11am. She stated that she was unable to make this because she does not have childcare. She also mentioned only being asked to come in for therapies for 3 days in which she has completed. When discussing if she could complete family edu and then take her home, she was amenable to doing this on Tuesday 9am-12pm with d/c to home. NO HHA preference.   SW  Sent HHPT/OT/SLP/Aide referral to Cassie[facility]. SW waiting on follow-up. DME already ordered; DME: hospital bed and  [facility] not contracted with pt insurance.   SW sent order to Tiffany/[facility]. SW waiting on follow-up.    Loralee Pacas, MSW, [place] Office: [phone] Cell: [phone] Fax: [phone]

## 2020-02-12 NOTE — Progress Notes (Signed)
Occupational Therapy Weekly Progress Note  Patient Details  Name: Anne [name] MRN: [id number] Date of Birth: September 16, 1960  Beginning of progress report period: 02/04/2020 End of progress report period: 02/12/2020  Today's Date: 02/12/2020 OT Individual Time: 2951-8841 OT Individual Time Calculation (min): 57 min    Patient has met 0 of 1 short term goals.   Pt continues to make slow progress at time of report. The interprofessional team is still working on finding an appropriate d/c location for pt, either home in care of family or to SNF. Family education has been ongoing this past week.     Patient continues to demonstrate the following deficits: muscle weakness and muscle paralysis, decreased cardiorespiratoy endurance, abnormal tone, unbalanced muscle activation, decreased coordination and decreased motor planning, decreased visual perceptual skills, decreased midline orientation, decreased attention to left and left side neglect, decreased attention, decreased awareness, decreased problem solving and decreased safety awareness and decreased sitting balance, decreased standing balance, decreased postural control and hemiplegia and therefore will continue to benefit from skilled OT intervention to enhance overall performance with BADL.  Patient progressing toward long term goals..  Continue plan of care.  OT Short Term Goals Week 3:  OT Short Term Goal 1 (Week 3): STGs=LTGs secondary to upcoming discharge OT Short Term Goal 1 - Progress (Week 3): Not met Week 4:  OT Short Term Goal 1 (Week 4): STGs=LTGs secondary to upcoming discharge OT Short Term Goal 1 - Progress (Week 4): Progressing toward goal Week 5:  OT Short Term Goal 1 (Week 5): STG=LTG OT Short Term Goal 2 (Week 5): Pt will tolerate sitting EOB with min A for 5 min in prep for ADL tasks OT Short Term Goal 3 (Week 5): Pt will don shirt in supported seated position with mod A OT Short Term Goal 4 (Week 5): Pt will perform basic  stand pivot transfer with max A +1 consistantly  Skilled Therapeutic Interventions/Progress Updates:    Pt greeted in bed, RN at bedside providing her with medicine to promote BM. Per pt, she felt "miserable" with pain in her stomach. She was agreeable to engage in tx to help increase gastric motility. Supine<sit completed with +2 assist. Worked on sitting balance and Lt NMR throughout session, pt starting tx by completing face washing and oral care. Note increased initiation by pt at this date, reaching for needing items on the table with cue to initiate. HOH to include Lt hand during oral care. Next, we played meaningful music while sitting EOB. Manual facilitation for coordinating both UEs or LEs in beat to music, swaying trunk, and rotating trunk. Pt required CGA for sitting balance initially, increasing to Mod A with onset of fatigue. Had pt work on scanning to midline to find RT and also fully towards the Lt side to find therapist. Pt smiling and crying during tx, when asked if she was crying "happy tears" because she liked the music, pt nodded. At end of session pt was returned to bed with +2 assist, boosted up and repositioned to protect hemiplegic side. Left pt with all needs within reach and bed alarm set.   Therapy Documentation Precautions:  Precautions Precautions: Other (comment) Precaution Comments: L hemi Restrictions Weight Bearing Restrictions: No Vital Signs: Therapy Vitals Temp: 99.1 F (37.3 C) Temp Source: Oral Pulse Rate: 77 BP: 128/81 Patient Position (if appropriate): Lying Oxygen Therapy SpO2: 97 % O2 Device: Room Air Pain: Pain Assessment Pain Scale: 0-10 Pain Score: 6  Pain Location: Head Pain Intervention(s): Medication (  See eMAR) ADL:     Therapy/Group: Individual Therapy  Sebron Mcmahill A Olga Seyler 02/12/2020, 7:13 AM

## 2020-02-12 NOTE — Progress Notes (Signed)
[place] PHYSICAL MEDICINE & REHABILITATION PROGRESS NOTE   Subjective/Complaints: No issues overnite Working with OT< awake and alert   ROS: Denies CP, SOB, N/V/D although mental status may limit accuracy of response   Objective:   No results found. No results for input(s): WBC, HGB, HCT, PLT in the last 72 hours. No results for input(s): NA, K, CL, CO2, GLUCOSE, BUN, CREATININE, CALCIUM in the last 72 hours.  Intake/Output Summary (Last 24 hours) at 02/12/2020 0819 Last data filed at 02/12/2020 0400 Gross per 24 hour  Intake 680 ml  Output --  Net 680 ml     Physical Exam: Vital Signs Blood pressure 128/81, pulse 77, temperature 99.1 F (37.3 C), temperature source Oral, resp. rate 18, weight 86.4 kg, SpO2 97 %.   General: No acute distress Mood and affect flat Heart: Regular rate and rhythm no rubs murmurs or extra sounds Lungs: Clear to auscultation, breathing unlabored, no rales or wheezes Abdomen: Positive bowel sounds, soft nontender to palpation, nondistended Extremities: No clubbing, cyanosis, or edema Skin: No evidence of breakdown, no evidence of rash  Motor: LUE: 2-2+/5 proximal distal, appears unchanged LLE: 2 -/5 proximal to distal  Assessment/Plan: 1. 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  which require 3+ hours per day of interdisciplinary therapy in a comprehensive inpatient rehab setting.  Physiatrist is providing close team supervision and 24 hour management of active medical problems listed below.  Physiatrist and rehab team continue to assess barriers to discharge/monitor patient progress toward functional and medical goals  Care Tool:  Bathing    Body parts bathed by patient: Chest, Abdomen, Front perineal area, Right upper leg, Left upper leg, Buttocks, Face   Body parts bathed by helper: Right lower leg, Left lower leg, Right arm, Left arm     Bathing assist Assist Level: Moderate Assistance - Patient 50 - 74%     Upper Body  Dressing/Undressing Upper body dressing   What is the patient wearing?: Bra, Pull over shirt    Upper body assist Assist Level: Total Assistance - Patient < 25%    Lower Body Dressing/Undressing Lower body dressing      What is the patient wearing?: Incontinence brief, Pants     Lower body assist Assist for lower body dressing: Total Assistance - Patient < 25%     Toileting Toileting    Toileting assist Assist for toileting: 2 Helpers     Transfers Chair/bed transfer  Transfers assist     Chair/bed transfer assist level: 2 Helpers     Locomotion Ambulation   Ambulation assist   Ambulation activity did not occur: Safety/medical concerns  Assist level: Total Assistance - Patient < 25% Assistive device:  (wall rail) Max distance: 3 steps   Walk 10 feet activity   Assist  Walk 10 feet activity did not occur: Safety/medical concerns        Walk 50 feet activity   Assist Walk 50 feet with 2 turns activity did not occur: Safety/medical concerns         Walk 150 feet activity   Assist Walk 150 feet activity did not occur: Safety/medical concerns         Walk 10 feet on uneven surface  activity   Assist Walk 10 feet on uneven surfaces activity did not occur: Safety/medical concerns         Wheelchair     Assist Will patient use wheelchair at discharge?: Yes Type of Wheelchair: Manual    Wheelchair assist  level: Dependent - Patient 0% Max wheelchair distance: 150    Wheelchair 50 feet with 2 turns activity    Assist        Assist Level: Dependent - Patient 0%   Wheelchair 150 feet activity     Assist      Assist Level: Dependent - Patient 0%   Blood pressure 128/81, pulse 77, temperature 99.1 F (37.3 C), temperature source Oral, resp. rate 18, weight 86.4 kg, SpO2 97 %.  Medical Problem List and Plan: 1. Left hemiparesis with right gaze preference, has difficulty tracking beyond midline to the left and  continues to have limited verbal output secondary to large right MCA infarct.   Continue CIR , OT,SLP   SNF  Pending FL-2 completed, family now willing to take home if unable to place     PRAFO/WHO qhs 2. Antithrombotics: -DVT/anticoagulation: Pharmaceutical: Lovenox -antiplatelet therapy: On ASA/Plavix 3. Pain Management: Tylenol prn. Sports creme Left calf tenderness as well as Right LE pain  C spine xray unremarkable   Controlled on 7/30 4. Mood: LCSW to follow for evaluation and support.  -antipsychotic agents: N/A 5. Neuropsych: This patient is not fully capable of making decisions on his own behalf. 6. Skin/Wound Care: Routine pressure relief measures 7. Fluids/Electrolytes/Nutrition: Monitor I/Os. Monitor I's and O's  8. HTN: Monitor BP tid--Imdur, lisinopril, cardizem and amlodipine has been on hold.  Cardizem 30 mg qid  Vitals:   02/11/20 2300 02/12/20 0500  BP: (!) 143/78 128/81  Pulse:  77  Resp:    Temp:  99.1 F (37.3 C)  SpO2:  97%   Relatively controlled on 7/30 9. UTI: Resolved  10. COPD: Continue Pulmicort nebs bid. 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 To contact Dr. Katherina Right closer to discharge.  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  Sleep disturbance?: Will monitor sleep wake cycle. Was on ambien and trazodone PTA? Ambien prn for now.   Improving  14. Azotemia due to dysphagia/elevated BUN   Resolved, normal metabolic package 8/50 15. Hyperglycemia?  Resolved 16. Constipation: Resolved, hold on further PRN laxative 17.  Hx depression, has depressed affect which may be part of cognitive changes post CVA but will trial Lexapro 5mg  , increase to 10mg , monitor effect   LOS: 35 days A FACE TO FACE EVALUATION WAS PERFORMED  Charlett Blake 02/12/2020, 8:19 AM

## 2020-02-13 ENCOUNTER — Inpatient Hospital Stay (HOSPITAL_COMMUNITY): Payer: Medicaid Other | Admitting: Physical Therapy

## 2020-02-13 ENCOUNTER — Inpatient Hospital Stay (HOSPITAL_COMMUNITY): Payer: Medicaid Other

## 2020-02-13 NOTE — Progress Notes (Signed)
Physical Therapy Session Note  Patient Details  Name: Anne [name] MRN: [id number] Date of Birth: 09/17/1960  Today's Date: 02/13/2020 PT Individual Time: 0104-0157 PT Individual Time Calculation (min): 53 min   Short Term Goals: Week 5:  PT Short Term Goal 1 (Week 5): Pt will maintain sitting balance EOB with supervision assist up to 5 min PT Short Term Goal 2 (Week 5): Pt will transfer to [facility] with mod assist and LRAD PT Short Term Goal 3 (Week 5): pt to perform WC mobility for 50 ft at min A  Skilled Therapeutic Interventions/Progress Updates:   Pt received sitting in WC and agreeable to PT. Pt transported to rehab gym to attempt stair training. When pt initiated standing was noted to have been incontinent of bladder. Pt transported back to room in [facility]. Stand pivot transfer with toilet with max assist to advance the LLE. Clothing managed by PT. Pt able to void bowel and bladder on toilet with increased time and mild digital stim from wash cloth when cleaning smear. Sit<>stand from [facility] over toilet with mod assist x 4 for pericare and clothing management. Standing balance with RUE on rail with mod-max assist due to  Inconsistent knee extension on the L. Pt requesting to return to bed. Stand pivot transfer to [facility] and then to bed with mod-max assist and UE suypport on rail. Sit>supine with mod assist to manage BLE and then increased time and cues for pt to use bed rails to improve position in bed. Pt left supine in bed with call bell in reach and all needs met.       Therapy Documentation Precautions:  Precautions Precautions: Other (comment) Precaution Comments: L hemi Restrictions Weight Bearing Restrictions: No Pain:  denies   Therapy/Group: Individual Therapy  [name] 02/13/2020, 1:57 PM

## 2020-02-13 NOTE — Progress Notes (Signed)
Occupational Therapy Session Note  Patient Details  Name: Anne [name] MRN: [id number] Date of Birth: 07-Feb-1961  Today's Date: 02/13/2020 OT Individual Time: 1400-1425 OT Individual Time Calculation (min): 25 min    Short Term Goals: Week 2:  OT Short Term Goal 1 (Week 2): Pt will transfer onto commode with assist of 1 person. OT Short Term Goal 1 - Progress (Week 2): Not met OT Short Term Goal 2 (Week 2): Pt will don UB clothing with mod A overall. OT Short Term Goal 2 - Progress (Week 2): Met OT Short Term Goal 3 (Week 2): Pt will locate 3 self care items to the L with mod cuing. OT Short Term Goal 3 - Progress (Week 2): Met  Skilled Therapeutic Interventions/Progress Updates:    OT spoke with PT prior to session reporting pt had just had incontinent episode, transitioned to/form toilet and requested to return to bed. Upon arrival to session, pt declined transitioning seated EOB or OOB, therefore provided AAROM and PROM to LUE. Mild tone noted with pt increasing ability to achieve full ROM with elbow extension, wrist supination/pronation, and extension of digits. Pt falling asleep at end of session, therefore OT provided hemiplegic positioning of LUE using pillows. Pt left with bed alarm on and all needs in reach.   Therapy Documentation Precautions:  Precautions Precautions: Other (comment) Precaution Comments: L hemi Restrictions Weight Bearing Restrictions: No General:   Vital Signs: Therapy Vitals Temp: 98.5 F (36.9 C) Temp Source: Oral Pulse Rate: 89 Resp: 18 BP: (!) 137/73 Oxygen Therapy SpO2: 100 % Pain:   ADL:   Vision   Perception    Praxis   Exercises:   Other Treatments:     Therapy/Group: Individual Therapy  Duayne Cal 02/13/2020, 2:30 PM

## 2020-02-13 NOTE — Progress Notes (Signed)
Physical Therapy Session Note  Patient Details  Name: Anne [name] MRN: [id number] Date of Birth: [birth year]  Today's Date: 02/13/2020 PT Individual Time: 0900-0953 PT Individual Time Calculation (min): 53 min   Short Term Goals: Week 5:  PT Short Term Goal 1 (Week 5): Pt will maintain sitting balance EOB with supervision assist up to 5 min PT Short Term Goal 2 (Week 5): Pt will transfer to [facility]  with mod assist and LRAD PT Short Term Goal 3 (Week 5): pt to perform WC mobility for 50 ft at min A  Skilled Therapeutic Interventions/Progress Updates:   Pt received supine in bed and agreeable to PT. Pt require max cues to perform any OOB mobility, but agreeable to get dressed and then transfer to [facility]. Supine>sit transfer with mod on the R side and cues for trunk control. Pt perform facial and lower body hygiene sitting OEB with supervision assist to coved uppoer portion of legs. Sit<>stand with mod assist x 3 to doff soiled brief and don pants and clean brief; mod assist to remain standing x 30sec on each bout. Clothing management with total A on the L side. Stand pivot transfer to [facility] with  Max assist due to poor step length on the R. PT attempted to assist pt with meal, but pt declined, except 4oz of water and 4oz of grape juice. Pt left sitting in WC with lap belt in place, call bell in reach and all needs met.       Therapy Documentation Precautions:  Precautions Precautions: Other (comment) Precaution Comments: L hemi Restrictions Weight Bearing Restrictions: No Pain: denies  Therapy/Group: Individual Therapy  [name] 02/13/2020, 10:07 AM

## 2020-02-13 NOTE — Progress Notes (Signed)
[place] PHYSICAL MEDICINE & REHABILITATION PROGRESS NOTE   Subjective/Complaints:  Says feels constipated- has had 3 BMs in last 12-24 hours- denies pain.      ROS:  Pt denies SOB, abd pain, CP, N/V/C/D, and vision changes Not sure if correct due to mental accuracy   Objective:   No results found. No results for input(s): WBC, HGB, HCT, PLT in the last 72 hours. No results for input(s): NA, K, CL, CO2, GLUCOSE, BUN, CREATININE, CALCIUM in the last 72 hours.  Intake/Output Summary (Last 24 hours) at 02/13/2020 1531 Last data filed at 02/13/2020 1200 Gross per 24 hour  Intake 480 ml  Output --  Net 480 ml     Physical Exam: Vital Signs Blood pressure (!) 137/73, pulse 89, temperature 98.5 F (36.9 C), temperature source Oral, resp. rate 18, weight 89.3 kg, SpO2 100 %.   General: No acute distress- sitting up in bed- appropriate, NAD- breakfast in front of her Mood and affect very flat- won't eat since got late tray- D'oesn't look good".  Heart: RRR Lungs: CTA B/L- no W/R/R- good air movement Abdomen: Soft, NT, ND, (+)BS  Extremities: No clubbing, cyanosis, or edema Skin: No evidence of breakdown, no evidence of rash  Motor: LUE: 2-2+/5 proximal distal, appears unchanged LLE: 2 -/5 proximal to distal  Assessment/Plan: 1. 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  which require 3+ hours per day of interdisciplinary therapy in a comprehensive inpatient rehab setting.  Physiatrist is providing close team supervision and 24 hour management of active medical problems listed below.  Physiatrist and rehab team continue to assess barriers to discharge/monitor patient progress toward functional and medical goals  Care Tool:  Bathing    Body parts bathed by patient: Chest, Abdomen, Front perineal area, Right upper leg, Left upper leg, Buttocks, Face   Body parts bathed by helper: Right lower leg, Left lower leg, Right arm, Left arm     Bathing assist Assist  Level: Moderate Assistance - Patient 50 - 74%     Upper Body Dressing/Undressing Upper body dressing   What is the patient wearing?: Bra, Pull over shirt    Upper body assist Assist Level: Total Assistance - Patient < 25%    Lower Body Dressing/Undressing Lower body dressing      What is the patient wearing?: Incontinence brief, Pants     Lower body assist Assist for lower body dressing: Total Assistance - Patient < 25%     Toileting Toileting    Toileting assist Assist for toileting: 2 Helpers     Transfers Chair/bed transfer  Transfers assist     Chair/bed transfer assist level: Maximal Assistance - Patient 25 - 49%     Locomotion Ambulation   Ambulation assist   Ambulation activity did not occur: Safety/medical concerns  Assist level: Maximal Assistance - Patient 25 - 49% Assistive device: Walker-hemi Max distance: 10   Walk 10 feet activity   Assist  Walk 10 feet activity did not occur: Safety/medical concerns  Assist level: Maximal Assistance - Patient 25 - 49% Assistive device: Walker-hemi   Walk 50 feet activity   Assist Walk 50 feet with 2 turns activity did not occur: Safety/medical concerns         Walk 150 feet activity   Assist Walk 150 feet activity did not occur: Safety/medical concerns         Walk 10 feet on uneven surface  activity   Assist Walk 10 feet on uneven surfaces activity  did not occur: Safety/medical concerns         Wheelchair     Assist Will patient use wheelchair at discharge?: Yes Type of Wheelchair: Manual    Wheelchair assist level: Moderate Assistance - Patient 50 - 74% Max wheelchair distance: 150    Wheelchair 50 feet with 2 turns activity    Assist        Assist Level: Moderate Assistance - Patient 50 - 74%   Wheelchair 150 feet activity     Assist      Assist Level: Moderate Assistance - Patient 50 - 74%   Blood pressure (!) 137/73, pulse 89, temperature 98.5 F  (36.9 C), temperature source Oral, resp. rate 18, weight 89.3 kg, SpO2 100 %.  Medical Problem List and Plan: 1. Left hemiparesis with right gaze preference, has difficulty tracking beyond midline to the left and continues to have limited verbal output secondary to large right MCA infarct.   Continue CIR , OT,SLP   SNF  Pending FL-2 completed, family now willing to take home if unable to place     PRAFO/WHO qhs 2. Antithrombotics: -DVT/anticoagulation: Pharmaceutical: Lovenox -antiplatelet therapy: On ASA/Plavix 3. Pain Management: Tylenol prn. Sports creme Left calf tenderness as well as Right LE pain  C spine xray unremarkable   7/31- pain controlled- con't regimen 4. Mood: LCSW to follow for evaluation and support.  -antipsychotic agents: N/A 5. Neuropsych: This patient is not fully capable of making decisions on his own behalf. 6. Skin/Wound Care: Routine pressure relief measures 7. Fluids/Electrolytes/Nutrition: Monitor I/Os. Monitor I's and O's  8. HTN: Monitor BP tid--Imdur, lisinopril, cardizem and amlodipine has been on hold.  Cardizem 30 mg qid  Vitals:   02/13/20 0434 02/13/20 1416  BP: (!) 149/76 (!) 137/73  Pulse: 69 89  Resp: 18 18  Temp: 98.6 F (37 C) 98.5 F (36.9 C)  SpO2: 100% 100%   7/31- relatively controlled con't regimen 9. UTI: Resolved  10. COPD: Continue Pulmicort nebs bid. 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 To contact Dr. Katherina Right closer to discharge.  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  Sleep disturbance?: Will monitor sleep wake cycle. Was on ambien and trazodone PTA? Ambien prn for now.   Improving  14. Azotemia due to dysphagia/elevated BUN   Resolved, normal metabolic package 0/34 15. Hyperglycemia?  Resolved 16. Constipation: Resolved, hold on further PRN laxative 17.  Hx depression, has depressed affect which may be part  of cognitive changes post CVA but will trial Lexapro 5mg  , increase to 10mg , monitor effect   7/31- still very flat affect, but just started  LOS: 36 days A FACE TO FACE EVALUATION WAS PERFORMED  Anne [name] 02/13/2020, 3:31 PM

## 2020-02-13 NOTE — Progress Notes (Signed)
Physical Therapy Session Note  Patient Details  Name: Anne [name] MRN: [id number] Date of Birth: Mar 12, 1961  Today's Date: 02/12/2020 PT Individual Time:1550-1615   25 min   Short Term Goals: Week 5:  PT Short Term Goal 1 (Week 5): Pt will maintain sitting balance EOB with supervision assist up to 5 min PT Short Term Goal 2 (Week 5): Pt will transfer to [facility] with mod assist and LRAD PT Short Term Goal 3 (Week 5): pt to perform WC mobility for 50 ft at min A  Skilled Therapeutic Interventions/Progress Updates:   Pt received supine in bed and agreeable to PT at bed level. PT encouraged OOB mobility, but pt declined. LLE hee slides x 10 with AAROM, hip abduction/adduction x 10 with AAROM, pt declined performing bilaterally but was noted to have improved volitional control of the LLE on this day. Trunk NMR to perform partial rolling r and L x 3 bil with max cue to engage LLE to push into rolling R as well as initiation of reach with LLE max encouragement to participate and engage ment NMR throughout session. Pt continues to decline any OOB mobility and left supine in bed with call bell in reach.        Therapy Documentation Precautions:  Precautions Precautions: Other (comment) Precaution Comments: L hemi Restrictions Weight Bearing Restrictions: No Pain: denies     Therapy/Group: Individual Therapy  [name] 02/13/2020, 4:12 AM

## 2020-02-14 NOTE — Plan of Care (Signed)
  Problem: Consults Goal: RH STROKE PATIENT EDUCATION Description: See Patient Education module for education specifics  Outcome: Progressing   Problem: RH BOWEL ELIMINATION Goal: RH STG MANAGE BOWEL WITH ASSISTANCE Description: STG Manage Bowel with  [place]. Outcome: Progressing   Problem: RH BLADDER ELIMINATION Goal: RH STG MANAGE BLADDER WITH ASSISTANCE Description: STG Manage Bladder With  Min Assistance  Outcome: Progressing   Problem: RH SKIN INTEGRITY Goal: RH STG SKIN FREE OF INFECTION/BREAKDOWN Description: Free of breakdown with min assist Outcome: Progressing   Problem: RH SAFETY Goal: RH STG ADHERE TO SAFETY PRECAUTIONS W/ASSISTANCE/DEVICE Description: STG Adhere to Safety Precautions With Min Assistance/Device.  Outcome: Progressing   Problem: RH COGNITION-NURSING Goal: RH STG USES MEMORY AIDS/STRATEGIES W/ASSIST TO PROBLEM SOLVE Description: STG Uses Memory Aids/Strategies With Min Assistance to Problem Solve.  Outcome: Progressing   Problem: RH KNOWLEDGE DEFICIT Goal: RH STG INCREASE KNOWLEDGE OF HYPERTENSION Description: Patient/family will be able to describe how to manage hypertension with cues/handouts Outcome: Progressing Goal: RH STG INCREASE KNOWLEDGE OF DYSPHAGIA/FLUID INTAKE Description: Patient/family will be able to describe safe diet and swallowing techniques with cues handouts Outcome: Progressing Goal: RH STG INCREASE KNOWLEDGE OF STROKE PROPHYLAXIS Description: Patient/family will be able to describe how to prevent stroke including diet and medication management with cues/handouts Outcome: Progressing

## 2020-02-14 NOTE — Progress Notes (Signed)
[place] PHYSICAL MEDICINE & REHABILITATION PROGRESS NOTE   Subjective/Complaints:  Pt focused on watching TV program- LBM yesterday per RN- pt denies pain- says "no problems".      ROS:   Pt denies SOB, abd pain, CP, N/V/C/D, and vision changes    Objective:   No results found. No results for input(s): WBC, HGB, HCT, PLT in the last 72 hours. No results for input(s): NA, K, CL, CO2, GLUCOSE, BUN, CREATININE, CALCIUM in the last 72 hours.  Intake/Output Summary (Last 24 hours) at 02/14/2020 1528 Last data filed at 02/14/2020 1200 Gross per 24 hour  Intake 414 ml  Output --  Net 414 ml     Physical Exam: Vital Signs Blood pressure (!) 85/44, pulse 83, temperature 99.3 F (37.4 C), temperature source Oral, resp. rate 18, weight 87.5 kg, SpO2 100 %.   General: No acute distress-watching TV, focused on tv program, slowed processing, NAD Mood /affect very flat still Heart: RRR Lungs: CTA B/L- no W/R/R- good air movement Abdomen: Soft, NT, ND, (+)BS   Extremities: No clubbing, cyanosis, or edema Skin: No evidence of breakdown, no evidence of rash  Motor: LUE: 2-2+/5 proximal distal, appears unchanged LLE: 2 -/5 proximal to distal  Assessment/Plan: 1. 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  which require 3+ hours per day of interdisciplinary therapy in a comprehensive inpatient rehab setting.  Physiatrist is providing close team supervision and 24 hour management of active medical problems listed below.  Physiatrist and rehab team continue to assess barriers to discharge/monitor patient progress toward functional and medical goals  Care Tool:  Bathing    Body parts bathed by patient: Chest, Abdomen, Front perineal area, Right upper leg, Left upper leg, Buttocks, Face   Body parts bathed by helper: Right lower leg, Left lower leg, Right arm, Left arm     Bathing assist Assist Level: Moderate Assistance - Patient 50 - 74%     Upper Body  Dressing/Undressing Upper body dressing   What is the patient wearing?: Bra, Pull over shirt    Upper body assist Assist Level: Total Assistance - Patient < 25%    Lower Body Dressing/Undressing Lower body dressing      What is the patient wearing?: Incontinence brief, Pants     Lower body assist Assist for lower body dressing: Total Assistance - Patient < 25%     Toileting Toileting    Toileting assist Assist for toileting: 2 Helpers     Transfers Chair/bed transfer  Transfers assist     Chair/bed transfer assist level: Maximal Assistance - Patient 25 - 49%     Locomotion Ambulation   Ambulation assist   Ambulation activity did not occur: Safety/medical concerns  Assist level: Maximal Assistance - Patient 25 - 49% Assistive device: Walker-hemi Max distance: 10   Walk 10 feet activity   Assist  Walk 10 feet activity did not occur: Safety/medical concerns  Assist level: Maximal Assistance - Patient 25 - 49% Assistive device: Walker-hemi   Walk 50 feet activity   Assist Walk 50 feet with 2 turns activity did not occur: Safety/medical concerns         Walk 150 feet activity   Assist Walk 150 feet activity did not occur: Safety/medical concerns         Walk 10 feet on uneven surface  activity   Assist Walk 10 feet on uneven surfaces activity did not occur: Safety/medical concerns         Wheelchair  Assist Will patient use wheelchair at discharge?: Yes Type of Wheelchair: Manual    Wheelchair assist level: Moderate Assistance - Patient 50 - 74% Max wheelchair distance: 150    Wheelchair 50 feet with 2 turns activity    Assist        Assist Level: Moderate Assistance - Patient 50 - 74%   Wheelchair 150 feet activity     Assist      Assist Level: Moderate Assistance - Patient 50 - 74%   Blood pressure (!) 85/44, pulse 83, temperature 99.3 F (37.4 C), temperature source Oral, resp. rate 18, weight 87.5  kg, SpO2 100 %.  Medical Problem List and Plan: 1. Left hemiparesis with right gaze preference, has difficulty tracking beyond midline to the left and continues to have limited verbal output secondary to large right MCA infarct.   Continue CIR , OT,SLP   SNF  Pending FL-2 completed, family now willing to take home if unable to place    PRAFO/WHO qhs 2. Antithrombotics: -DVT/anticoagulation: Pharmaceutical: Lovenox -antiplatelet therapy: On ASA/Plavix 3. Pain Management: Tylenol prn. Sports creme Left calf tenderness as well as Right LE pain  C spine xray unremarkable   8/1- denies pain- con't regimen prn 4. Mood: LCSW to follow for evaluation and support.  -antipsychotic agents: N/A 5. Neuropsych: This patient is not fully capable of making decisions on his own behalf. 6. Skin/Wound Care: Routine pressure relief measures 7. Fluids/Electrolytes/Nutrition: Monitor I/Os. Monitor I's and O's  8. HTN: Monitor BP tid--Imdur, lisinopril, cardizem and amlodipine has been on hold.  Cardizem 30 mg qid  Vitals:   02/14/20 1256 02/14/20 1333  BP: (!) 144/66 (!) 85/44  Pulse: 92 83  Resp: 18 18  Temp: 99.3 F (37.4 C)   SpO2: 100% 100%   8/1- BP labile today -80s to 140s SBP- con't regimen for now. Might need changes 9. UTI: Resolved  10. COPD: Continue Pulmicort nebs bid. 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 To contact Dr. Katherina Right closer to discharge.  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  Sleep disturbance?: Will monitor sleep wake cycle. Was on ambien and trazodone PTA? Ambien prn for now.   Improving  14. Azotemia due to dysphagia/elevated BUN   Resolved, normal metabolic package 1/61 15. Hyperglycemia?  Resolved 16. Constipation: Resolved, hold on further PRN laxative 17.  Hx depression, has depressed affect which may be part of cognitive changes post CVA but will  trial Lexapro 5mg  , increase to 10mg , monitor effect   8/1- just started meds- still very flat.   LOS: 37 days A FACE TO FACE EVALUATION WAS PERFORMED  Solan Vosler 02/14/2020, 3:28 PM

## 2020-02-15 ENCOUNTER — Inpatient Hospital Stay (HOSPITAL_COMMUNITY): Payer: Medicaid Other | Admitting: Occupational Therapy

## 2020-02-15 ENCOUNTER — Inpatient Hospital Stay (HOSPITAL_COMMUNITY): Payer: Medicaid Other | Admitting: Physical Therapy

## 2020-02-15 ENCOUNTER — Inpatient Hospital Stay (HOSPITAL_COMMUNITY): Payer: Medicaid Other | Admitting: Speech Pathology

## 2020-02-15 LAB — BASIC METABOLIC PANEL
Anion gap: 10 (ref 5–15)
BUN: 19 mg/dL (ref 6–20)
CO2: 23 mmol/L (ref 22–32)
Calcium: 10.5 mg/dL — ABNORMAL HIGH (ref 8.9–10.3)
Chloride: 103 mmol/L (ref 98–111)
Creatinine, Ser: 0.76 mg/dL (ref 0.44–1.00)
GFR calc Af Amer: >60 mL/min (ref >60)
GFR calc non Af Amer: >60 mL/min (ref >60)
Glucose, Bld: 96 mg/dL (ref 70–99)
Potassium: 4 mmol/L (ref 3.5–5.1)
Sodium: 136 mmol/L (ref 135–145)

## 2020-02-15 LAB — CBC
HCT: 38.9 % (ref 36.0–46.0)
Hemoglobin: 12.4 g/dL (ref 12.0–15.0)
MCH: 27.6 pg (ref 26.0–34.0)
MCHC: 31.9 g/dL (ref 30.0–36.0)
MCV: 86.4 fL (ref 80.0–100.0)
Platelets: 262 10*3/uL (ref 150–400)
RBC: 4.5 MIL/uL (ref 3.87–5.11)
RDW: 12.8 % (ref 11.5–15.5)
WBC: 8.6 10*3/uL (ref 4.0–10.5)
nRBC: 0 % (ref 0.0–0.2)

## 2020-02-15 NOTE — Progress Notes (Signed)
Occupational Therapy Session Note  Patient Details  Name: Anne [name] MRN: [id number] Date of Birth: 1961/01/03  Today's Date: 02/15/2020 OT Individual Time: 3329-5188 OT Individual Time Calculation (min): 44 min    Short Term Goals: Week 4:  OT Short Term Goal 1 (Week 4): STGs=LTGs secondary to upcoming discharge OT Short Term Goal 1 - Progress (Week 4): Progressing toward goal  Skilled Therapeutic Interventions/Progress Updates:    Pt completed supine to sit EOB with max assist to start session.  She then needed max assist for stand pivot transfer to the wheelchair secondary to increased pushing to the left as well as decreased initiation of sit to stand.  Once in the chair, had her work on washing her face and completion of dressing tasks sit to stand at the sink.  She was able to wash her face with setup and mod instructional cueing for initiation.  Max instructional cueing needed for scanning to the left of midline for location of the washcloth in therapist's hand as well as for all other items utilized during grooming and dressing tasks.  She needed max assist for head turn to the right as well when attempting to assist with removal of clothing from the left arm or when attempting to donn it.  Overall she needed max assist for donning a pullover shirt with total assist +2 (pt 40%) for donning pull up pants.  Therapist provided total assist for donning gripper socks and shoes.  Pt throughout session would continually try to prop her right hand on her forehead and stop attempting tasks, requiring max demonstrational cueing from therapist to maintain sustained attention.  Finished session with pt sitting up in the wheelchair and with the call button and phone in reach.  Safety belt in place as well with half lap tray supporting the LUE.    Therapy Documentation Precautions:  Precautions Precautions: Other (comment) Precaution Comments: L hemi Restrictions Weight Bearing Restrictions:  No Pain: Pain Assessment Pain Scale: Faces Faces Pain Scale: Hurts a little bit Pain Location: Hand Pain Orientation: Left Pain Descriptors / Indicators: Grimacing Pain Onset: With Activity Pain Intervention(s): Repositioned;Emotional support ADL: See Care Tool Section for some details of mobility and selfcare  Therapy/Group: Individual Therapy  Amiylah Anastos OTR/L 02/15/2020, 12:29 PM

## 2020-02-15 NOTE — Progress Notes (Signed)
Physical Therapy Session Note  Patient Details  Name: Anne [name] MRN: [id number] Date of Birth: [birth year]  Today's Date: 02/15/2020 PT Individual Time: 1510-1553 PT Individual Time Calculation (min): 43 min   Short Term Goals: Week 5:  PT Short Term Goal 1 (Week 5): Pt will maintain sitting balance EOB with supervision assist up to 5 min PT Short Term Goal 2 (Week 5): Pt will transfer to [facility] with mod assist and LRAD PT Short Term Goal 3 (Week 5): pt to perform WC mobility for 50 ft at min A  Skilled Therapeutic Interventions/Progress Updates:    Pt received awake, supine in bed and agreeable to therapy session with minimal encouragement. Pt continues to demo R gaze preference and L inattention. Supine>sitting L EOB with max assist for trunk upright due to L lean - pt demos delayed initiation and slow processing requiring significantly increased time to complete mobility task. Sitting EOB cue for R UE support on footboard to prevent L posterior lean/LOB with close supervision. R squat pivot EOB>w/c with max assist of 1 for lifting and pivoting hips with +2 min assist for pivoting hips - blocking L knee and facilitating weight bearing.  Transported to/from gym in w/c for time management and energy conservation. L stand pivot w/c>EOM using hemiwalker on R - heavy mod assist for lifting and mod assist for balance while turning - manual facilitation for L LE stepping and blocking L knee during weightbearing. Sit<>stand to/from EOM x2reps - heavy mod assist for lifting into standing - once in standing +2 assist provided hemiwalker for R UE support and maintained balance with min/mod assist - while standing worked on L visual scanning task to locate and verbalize number/letter on L side of mirror. During seated rest break performed L visual scanning task to read out sequence of numbers. R stand pivot EOM>w/c using hemiwalker with heavy mod assist for lifting into standing and mod assist for balance while  turning - pt automatically transitions to a squat pivot grabbing onto w/c armrest and rotating hips into seat requiring mod assist to ensure pt safety. Transported back to room and pt requesting to remain seated up in w/c stating she is "tired of that be." Pt educated on plan for hands-on family education/training tomorrow morning with her daughter prior to ambulance transport home. Pt left seated in w/c with needs in reach, L UE supported on 1/2 lap tray, and seat belt alarm on.  Therapy Documentation Precautions:  Precautions Precautions: Other (comment) Precaution Comments: L hemi Restrictions Weight Bearing Restrictions: No  Pain:   Reports tenderness to light touch on L hand and arm - careful when assisting with L UE repositioning during mobility tasks.   Therapy/Group: Individual Therapy  Tawana Scale , PT, DPT, CSRS  02/15/2020, 6:10 PM

## 2020-02-15 NOTE — Progress Notes (Signed)
Occupational Therapy Session Note  Patient Details  Name: Fatumata Kashani MRN: [id number] Date of Birth: [birth year]  Today's Date: 02/15/2020 OT Individual Time: 1119-1202 OT Individual Time Calculation (min): 43 min    Skilled Therapeutic Interventions/Progress Updates:    Pt greeted in the w/c, seated at sink with the water running. She was requesting her toothbrush so she could brush her teeth. OT retrieved pts needed items and provided Min A and cuing while engaging in oral care and other grooming tasks, vcs for Lt visual field scanning. Pt then reported needing to use the bathroom. Set pt up for stand pivot and squat pivot to the elevated toilet. Pt ultimately unable to assist therapist during transfer when given encouragement, increased time, and multimodal cuing. Maximove transfer to bed completed with Total A where pt was found to be incontinent of bladder, she declined bedpan use. Left her in care of NT for hygiene/brief change.   Therapy Documentation Precautions:  Precautions Precautions: Other (comment) Precaution Comments: L hemi Restrictions Weight Bearing Restrictions: No     Therapy/Group: Individual Therapy  Ollis Daudelin A Keelin Sheridan 02/15/2020, 4:24 PM

## 2020-02-15 NOTE — Progress Notes (Signed)
Occupational Therapy Discharge Summary  Patient Details  Name: Anne [name] MRN: [id number] Date of Birth: 1961-02-18   Patient has met 2 of 4 long term goals due to improved activity tolerance, improved balance, postural control and improved attention.  Patient to discharge at [facility] Max Assist level.  Patient's daughter has attended hands on family training to provide the necessary assistance at discharge.    Reasons goals not met: [name] level of attention and initiation fluctuates and the last few days of rehab she required more assist with her UB and LB dressing requiring max UB and +2 A Lb.  Recommendation:  Patient will benefit from ongoing skilled OT services in home health setting to continue to advance functional skills in the area of BADL and Reduce care partner burden.  Equipment: hospital bed, hoyer lift  Reasons for discharge: discharge from hospital  Patient/family agrees with progress made and goals achieved: Yes    OT Discharge ADL ADL Eating: Supervision/safety Grooming: Minimal cueing, Minimal assistance Upper Body Bathing: Moderate assistance Lower Body Bathing: Moderate assistance Where Assessed-Lower Body Bathing: Bed level Upper Body Dressing: Maximal assistance Where Assessed-Upper Body Dressing: Wheelchair Lower Body Dressing: Dependent Where Assessed-Lower Body Dressing: Wheelchair Toileting: Dependent Where Assessed-Toileting: Bedside Commode Toilet Transfer: Maximal assistance Toilet Transfer Method: Squat pivot Vision Patient Visual Report: No change from baseline Ocular Range of Motion: Restricted on the left Alignment/Gaze Preference: Gaze right Tracking/Visual Pursuits: Unable to hold eye position out of midline;Impaired - to be further tested in functional context;Decreased smoothness of horizontal tracking;Decreased smoothness of vertical tracking Visual Fields: Left visual field deficit Perception  Perception:  Impaired Inattention/Neglect: Does not attend to left visual field;Does not attend to left side of body Praxis Praxis: Impaired Praxis Impairment Details: Initiation;Motor planning;Ideomotor Cognition Overall Cognitive Status: Impaired/Different from baseline Sustained Attention Impairment: Verbal basic;Functional basic Memory Impairment: Storage deficit;Retrieval deficit;Decreased short term memory Awareness: Impaired Awareness Impairment: Intellectual impairment Behaviors: Perseveration Comments: left inattention, decreased attention and awareness Sensation Sensation Light Touch: Impaired by gross assessment Light Touch Impaired Details: Absent LUE;Impaired LLE Proprioception: Impaired by gross assessment Stereognosis: Impaired by gross assessment Coordination Gross Motor Movements are Fluid and Coordinated: No Fine Motor Movements are Fluid and Coordinated: No Finger Nose Finger Test: Unable to complete with LUE Motor  Motor Motor: Hemiplegia;Abnormal tone;Motor apraxia Mobility  Transfers Sit to Stand: Moderate Assistance - Patient 50-74%  Trunk/Postural Assessment  Postural Control Trunk Control: Poor Righting Reactions: Poor Protective Responses: Poor  Balance Dynamic Sitting Balance Dynamic Sitting - Level of Assistance: 3: Mod assist Extremity/Trunk Assessment RUE Assessment RUE Assessment: Within Functional Limits LUE Assessment Active Range of Motion (AROM) Comments: Slight tricep activation noted 1x in gravity assisted position General Strength Comments: 0/5   Anne [name] 02/16/2020, 3:30 PM

## 2020-02-15 NOTE — Progress Notes (Signed)
[place] PHYSICAL MEDICINE & REHABILITATION PROGRESS NOTE   Subjective/Complaints:   Pt without new issues, pt denies breathing problems or pain Reviewed labs    ROS:   Pt denies SOB, abd pain, CP, N/V/C/D, and vision changes    Objective:   No results found. Recent Labs    02/15/20 0605  WBC 8.6  HGB 12.4  HCT 38.9  PLT 262   Recent Labs    02/15/20 0605  NA 136  K 4.0  CL 103  CO2 23  GLUCOSE 96  BUN 19  CREATININE 0.76  CALCIUM 10.5*    Intake/Output Summary (Last 24 hours) at 02/15/2020 0918 Last data filed at 02/15/2020 0700 Gross per 24 hour  Intake 540 ml  Output --  Net 540 ml     Physical Exam: Vital Signs Blood pressure (!) 140/70, pulse 77, temperature 98.3 F (36.8 C), temperature source Oral, resp. rate 18, weight 87.5 kg, SpO2 98 %.    General: No acute distress Mood and affect are appropriate Heart: Regular rate and rhythm no rubs murmurs or extra sounds Lungs: Clear to auscultation, breathing unlabored, no rales or wheezes Abdomen: Positive bowel sounds, soft nontender to palpation, nondistended Extremities: No clubbing, cyanosis, or edema Skin: No evidence of breakdown, no evidence of rash    Motor: LUE: 2-2+/5 proximal distal, appears unchanged LLE: 2 -/5 proximal to distal  Assessment/Plan: 1. 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  which require 3+ hours per day of interdisciplinary therapy in a comprehensive inpatient rehab setting.  Physiatrist is providing close team supervision and 24 hour management of active medical problems listed below.  Physiatrist and rehab team continue to assess barriers to discharge/monitor patient progress toward functional and medical goals  Care Tool:  Bathing    Body parts bathed by patient: Chest, Abdomen, Front perineal area, Right upper leg, Left upper leg, Buttocks, Face   Body parts bathed by helper: Right lower leg, Left lower leg, Right arm, Left arm     Bathing  assist Assist Level: Moderate Assistance - Patient 50 - 74%     Upper Body Dressing/Undressing Upper body dressing   What is the patient wearing?: Bra, Pull over shirt    Upper body assist Assist Level: Total Assistance - Patient < 25%    Lower Body Dressing/Undressing Lower body dressing      What is the patient wearing?: Incontinence brief, Pants     Lower body assist Assist for lower body dressing: Total Assistance - Patient < 25%     Toileting Toileting    Toileting assist Assist for toileting: 2 Helpers     Transfers Chair/bed transfer  Transfers assist     Chair/bed transfer assist level: Maximal Assistance - Patient 25 - 49%     Locomotion Ambulation   Ambulation assist   Ambulation activity did not occur: Safety/medical concerns  Assist level: Maximal Assistance - Patient 25 - 49% Assistive device: Walker-hemi Max distance: 10   Walk 10 feet activity   Assist  Walk 10 feet activity did not occur: Safety/medical concerns  Assist level: Maximal Assistance - Patient 25 - 49% Assistive device: Walker-hemi   Walk 50 feet activity   Assist Walk 50 feet with 2 turns activity did not occur: Safety/medical concerns         Walk 150 feet activity   Assist Walk 150 feet activity did not occur: Safety/medical concerns         Walk 10 feet on uneven surface  activity   Assist Walk 10 feet on uneven surfaces activity did not occur: Safety/medical concerns         Wheelchair     Assist Will patient use wheelchair at discharge?: Yes Type of Wheelchair: Manual    Wheelchair assist level: Moderate Assistance - Patient 50 - 74% Max wheelchair distance: 150    Wheelchair 50 feet with 2 turns activity    Assist        Assist Level: Moderate Assistance - Patient 50 - 74%   Wheelchair 150 feet activity     Assist      Assist Level: Moderate Assistance - Patient 50 - 74%   Blood pressure (!) 140/70, pulse 77,  temperature 98.3 F (36.8 C), temperature source Oral, resp. rate 18, weight 87.5 kg, SpO2 98 %.  Medical Problem List and Plan: 1. Left hemiparesis with right gaze preference, has difficulty tracking beyond midline to the left and continues to have limited verbal output secondary to large right MCA infarct.   Continue CIR , OT,SLP   SNF  Pending FL-2 completed, family now willing to take home if unable to place    PRAFO/WHO qhs 2. Antithrombotics: -DVT/anticoagulation: Pharmaceutical: Lovenox -antiplatelet therapy: On ASA/Plavix 3. Pain Management: Tylenol prn. Sports creme Left calf tenderness as well as Right LE pain  C spine xray unremarkable   8/1- denies pain- con't regimen prn 4. Mood: LCSW to follow for evaluation and support.  -antipsychotic agents: N/A 5. Neuropsych: This patient is not fully capable of making decisions on his own behalf. 6. Skin/Wound Care: Routine pressure relief measures 7. Fluids/Electrolytes/Nutrition: Monitor I/Os. Monitor I's and O's  8. HTN: Monitor BP tid--Imdur, lisinopril, cardizem and amlodipine has been on hold.  Cardizem 30 mg qid  Vitals:   02/14/20 2338 02/15/20 0506  BP: (!) 143/67 (!) 140/70  Pulse:  77  Resp:  18  Temp:  98.3 F (36.8 C)  SpO2:  98%   8/1- BP labile today -80s to 140s SBP- con't regimen for now. Might need changes 9. UTI: Resolved  10. COPD: Continue Pulmicort nebs bid. 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 To contact Dr. Katherina Right closer to discharge.  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  Sleep disturbance?: Will monitor sleep wake cycle. Was on ambien and trazodone PTA? Ambien prn for now.   Improving  14. Azotemia due to dysphagia/elevated BUN   Resolved, normal metabolic package 2/22 15. Hyperglycemia?  Resolved 16. Constipation: Resolved, hold on further PRN laxative 17.  Hx depression, has  depressed affect which may be part of cognitive changes post CVA but will trial Lexapro 5mg  , increase to 10mg , monitor effect  No major imp[rovement with affect noted, CVA releated cognitive changes (R MCA with aprosodia) may not respond   LOS: 38 days A FACE TO FACE EVALUATION WAS PERFORMED  Charlett Blake 02/15/2020, 9:18 AM

## 2020-02-15 NOTE — Progress Notes (Signed)
Patient ID: Rajanee Schuelke, female   DOB: 26-Feb-1961, 59 y.o.   MRN: [id number]  This SW covering for primary  [place].   SW returned phone call to pt dtr [name] [phone]) who reported that the hospital bed delivered was broken and did not have good mattress. SW returned phone call to speak with pt dtr [name] to discuss. She was unclear on the style of hospital bed that will be delivered. SW explained what is being provided. She asked SW to call back as she was at medical appointment with her son.  SW spoke with [employer] Liaison and Wyatt Portela [employer] rep to inform on above. Will work on resolving issue.   SW called pt dtr Christiana to see if there were any updates and if she heard from [place]. No updates yet. SW provided her with contact information for [facility], and encouraged her to call.   SW faxed PCS referral to [employer] [phone]).  SW waiting on updates from [place] about HHPT/OT/ST/aide referral.   Loralee Pacas, MSW, [place] Office: [phone] Cell: [phone] Fax: [phone]

## 2020-02-15 NOTE — Progress Notes (Signed)
Speech Language Pathology Discharge Summary  Patient Details  Name: Anne [name] MRN: [id number] Date of Birth: 22-Jul-1960  Today's Date: 02/15/2020 SLP Individual Time: 1300-1329 SLP Individual Time Calculation (min): 29 min   Skilled Therapeutic Interventions:  Pt was seen for skilled ST targeting cognitive goals. Pt had not eaten lunch upon arrival, but declined opportunity for SLP to assist her with it. Therefore, SLP facilitated session with administration of [facility] cognitive evaluation as post-test for this admission. Pt scored 7/30, which is indicative of severe neurocognitive impairments. Most noteworthy areas of impairment continue to be left visual neglect, attention, initiation, and short term memory. Pt also required Max A verbal cues for insight/awarenss of her performance on evaluations, as she stated, "I don't know how I did". Pt left laying in bed with alarm set and needs within reach. Continue per current plan of care.       Patient has met 8 of 8 long term goals.  Patient to discharge at overall Mod;Max level.  Reasons goals not met: none   Clinical Impression/Discharge Summary:   Pt made very slow and minimal gains; goals were downgraded during her stay due to limited progress, and therefore she ultimately met 8 out of 8 long term goals. Pt currently requires Mod-Max assist for basic familiar tasks and due to cognitive impairments impacting her attention, problem solving, initiation, short term memory, and awareness. As a result, she will require strict 24/7 supervision and care at discharge for greatest [place]. Her speech intelligibility has improved, only required Min A for use of increased vocal intensity as compensatory strategy to increase intelligibility to ~90% at the phrase and sentence level. Pt is consuming an upgraded dysphagia 3 (mechanical soft) texture diet with thin liquids and is using compensatory and safe swallow strategies with Min-Mod A cueing. Given moderately  severe cognitive deficits and mild dysphagia still present, recommend pt continue to receive skilled ST services upon discharge. Pt and family education is complete at this time.    Care Partner:  Caregiver Able to Provide Assistance: Yes  Type of Caregiver Assistance: Cognitive  Recommendation:  Skilled Nursing facility;24 hour supervision/assistance;Home Health SLP  Rationale for SLP Follow Up: Maximize cognitive function and independence;Reduce caregiver burden;Maximize swallowing safety;Maximize functional communication   Equipment: none   Reasons for discharge: Discharged from hospital   Patient/Family Agrees with Progress Made and Goals Achieved: Yes    Arbutus Leas 02/15/2020, 3:03 PM

## 2020-02-16 ENCOUNTER — Ambulatory Visit (HOSPITAL_COMMUNITY): Payer: Medicaid Other | Admitting: Physical Therapy

## 2020-02-16 ENCOUNTER — Encounter (HOSPITAL_COMMUNITY): Payer: Medicaid Other | Admitting: Speech Pathology

## 2020-02-16 ENCOUNTER — Encounter (HOSPITAL_COMMUNITY): Payer: Medicaid Other | Admitting: Occupational Therapy

## 2020-02-16 MED ORDER — CLOPIDOGREL BISULFATE 75 MG PO TABS: 75.0000 mg | ORAL_TABLET | Freq: Every day | ORAL | 0 refills | Status: AC

## 2020-02-16 MED ORDER — LIDOCAINE 5 % EX PTCH: 1.0000 | MEDICATED_PATCH | CUTANEOUS | 0 refills | Status: AC

## 2020-02-16 MED ORDER — DILTIAZEM HCL 30 MG PO TABS: 30.0000 mg | ORAL_TABLET | Freq: Four times a day (QID) | ORAL | 0 refills | Status: AC

## 2020-02-16 MED ORDER — ASPIRIN 325 MG PO TABS: 325.0000 mg | ORAL_TABLET | Freq: Every day | ORAL | 0 refills | Status: AC

## 2020-02-16 MED ORDER — ADULT MULTIVITAMIN W/MINERALS CH: 1.0000 | ORAL_TABLET | Freq: Every day | ORAL | 0 refills | Status: AC

## 2020-02-16 MED ORDER — MUSCLE RUB 10-15 % EX CREA: 1.0000 "application " | TOPICAL_CREAM | Freq: Two times a day (BID) | CUTANEOUS | 0 refills | Status: AC | PRN

## 2020-02-16 MED ORDER — LISINOPRIL 2.5 MG PO TABS: 2.5000 mg | ORAL_TABLET | Freq: Every day | ORAL | 0 refills | Status: AC

## 2020-02-16 MED ORDER — TRAZODONE HCL 50 MG PO TABS: 25.0000 mg | ORAL_TABLET | Freq: Every evening | ORAL | 0 refills | Status: AC | PRN

## 2020-02-16 MED ORDER — ESCITALOPRAM OXALATE 10 MG PO TABS: 10.0000 mg | ORAL_TABLET | Freq: Every day | ORAL | 0 refills | Status: AC

## 2020-02-16 MED ORDER — MEGESTROL ACETATE 400 MG/10ML PO SUSP: 400.0000 mg | Freq: Two times a day (BID) | ORAL | 2 refills | Status: AC

## 2020-02-16 MED ORDER — ACETAMINOPHEN 325 MG PO TABS: 650.0000 mg | ORAL_TABLET | Freq: Four times a day (QID) | ORAL | Status: AC

## 2020-02-16 MED ORDER — ATORVASTATIN CALCIUM 40 MG PO TABS: 40.0000 mg | ORAL_TABLET | Freq: Every day | ORAL | 0 refills | Status: AC

## 2020-02-16 MED ORDER — FOLIC ACID 1 MG PO TABS: 1.0000 mg | ORAL_TABLET | Freq: Every day | ORAL | 0 refills | Status: AC

## 2020-02-16 MED ORDER — THIAMINE HCL 100 MG PO TABS: 100.0000 mg | ORAL_TABLET | Freq: Every day | ORAL | 0 refills | Status: AC

## 2020-02-16 NOTE — Discharge Instructions (Signed)
Inpatient Rehab Discharge Instructions  [place] Discharge date and time:  02/16/20  Activities/Precautions/ Functional Status: Activity: no lifting, driving, or strenuous exercise till cleared by MD Diet: cardiac diet and diabetic diet--foods need to be soft. Wound Care: none needed    Functional status:  ___ No restrictions     ___ Walk up steps independently _X__ 24/7 supervision/assistance   ___ Walk up steps with assistance ___ Intermittent supervision/assistance  ___ Bathe/dress independently ___ Walk with walker     _X__ Bathe/dress with assistance ___ Walk Independently    ___ Shower independently ___ Walk with assistance    ___ Shower with assistance _X__ No alcohol     ___ Return to work/school ________  COMMUNITY REFERRALS UPON DISCHARGE:    Home Health:   PT    OT     ST      SNA                       Agency: Phone:    Medical Equipment/Items Ordered: hoyer lift, hospital bed, wheelchair                                                 Agency/Supplier: [facility] PATIENT/FAMILY:    Special Instructions: 1. Small bites/small sips. Medications have to be crushed and administered in puree.   STROKE/TIA DISCHARGE INSTRUCTIONS SMOKING Cigarette smoking nearly doubles your risk of having a stroke & is the single most alterable risk factor  If you smoke or have smoked in the last 12 months, you are advised to quit smoking for your health.  Most of the excess cardiovascular risk related to smoking disappears within a year of stopping.  Ask you doctor about anti-smoking medications  [place] Quit Line: 1-800-QUIT NOW  Free Smoking Cessation Classes [phone]  CHOLESTEROL Know your levels; limit fat & cholesterol in your diet  Lipid Panel     Component Value Date/Time   CHOL 184 01/01/2020 0559   TRIG 103 01/02/2020 0626   HDL 38 (L) 01/01/2020 0559   CHOLHDL 4.8 01/01/2020 0559   VLDL 58 (H) 01/01/2020 0559     LDLCALC 88 01/01/2020 0559      Many patients benefit from treatment even if their cholesterol is at goal.  Goal: Total Cholesterol (CHOL) less than 160  Goal:  Triglycerides (TRIG) less than 150  Goal:  HDL greater than 40  Goal:  LDL (LDLCALC) less than 100   BLOOD PRESSURE American Stroke Association blood pressure target is less that 120/80 mm/Hg  Your discharge blood pressure is:  BP: 114/74  Monitor your blood pressure  Limit your salt and alcohol intake  Many individuals will require more than one medication for high blood pressure  DIABETES (A1c is a blood sugar average for last 3 months) Goal HGBA1c is under 7% (HBGA1c is blood sugar average for last 3 months)  Diabetes: No known diagnosis of diabetes    Lab Results  Component Value Date   HGBA1C 5.5 01/01/2020     Your HGBA1c can be lowered with medications, healthy diet, and exercise.  Check your blood sugar as directed by your physician  Call your physician if you experience unexplained or low blood sugars.  PHYSICAL ACTIVITY/REHABILITATION Goal is 30 minutes at least 4 days per week  Activity:  No driving, Therapies: see above Return to work: N/A  Activity decreases your risk of heart attack and stroke and makes your heart stronger.  It helps control your weight and blood pressure; helps you relax and can improve your mood.  Participate in a regular exercise program.  Talk with your doctor about the best form of exercise for you (dancing, walking, swimming, cycling).  DIET/WEIGHT Goal is to maintain a healthy weight  Your discharge diet is:  Diet Order            DIET DYS 3 Room service appropriate? Yes; Fluid consistency: Thin  Diet effective 1000                 liquids Your height is:  5'6" Your current weight is: Weight: 87.5 kg Your Body Mass Index (BMI) is:  BMI (Calculated): 32.4  Following the type of diet specifically designed for you will help prevent another stroke.  Your goal  weight is 155 lbs  Your goal Body Mass Index (BMI) is 19-24.  Healthy food habits can help reduce 3 risk factors for stroke:  High cholesterol, hypertension, and excess weight.  RESOURCES Stroke/Support Group:  Call [phone]   STROKE EDUCATION PROVIDED/REVIEWED AND GIVEN TO PATIENT Stroke warning signs and symptoms How to activate emergency medical system (call 911). Medications prescribed at discharge. Need for follow-up after discharge. Personal risk factors for stroke. Pneumonia vaccine given:  Flu vaccine given:  My questions have been answered, the writing is legible, and I understand these instructions.  I will adhere to these goals & educational materials that have been provided to me after my discharge from the hospital.    My questions have been answered and I understand these instructions. I will adhere to these goals and the provided educational materials after my discharge from the hospital.  Patient/Caregiver Signature _______________________________ Date __________  Clinician Signature _______________________________________ Date __________  Please bring this form and your medication list with you to all your follow-up doctor's appointments.

## 2020-02-16 NOTE — Progress Notes (Signed)
Physical Therapy Discharge Summary  Patient Details  Name: Starkisha Tullis MRN: [id number] Date of Birth: 1961/01/03  Today's Date: 02/16/2020 PT Individual Time: 0903-1004 PT Individual Time Calculation (min): 61 min    Pt denied pain at start and end of session. Pt directed in rolling in bed, mod A for brief change and total assist for donning pants in bed. Pt directed in sit>supine at max A to EOB, VC  For technique and increased initiation, sat EOB min A for dynamic balance with max A to don shirt with extra time to allow pt time to use RUE to assist dressing LUE. Pt directed in stand pivot to chair at mod A x1 with initiation to ascend from bed and block L knee required, VC for step/by/step to fully sit in chair. Pt taken to hall rail in [facility] for time management at total assist. Pt directed in gait training at rail on R side for 25' max A with manual facilitation required and provided for trunk extension, L knee extension, glute activation, weight shifting, and single step VC for limb advancement, manual facilitation for LLE advancement. [name] family then arrived to session for training, educated on PT recommendations of use of hoyer for transfers 2/2 pt's inconsistency with transfer level of assist, she reported she understood and felt comfortable transferring pt from/to WC from/to bed. Pt directed in manual WC mobility with family present with use of RUE and RLE to mobilize for 16' at mod A pt did demonstrate use of RLE to assist. Pt left with family in room, alarm set, in [facility], All needs in reach and in good condition. Call light in hand.  [name] family reports she feels comfortable with pt's assist at home, all equipment use and needs, and has no concerns or questions at this time.    Patient has met 2 of 8 long term goals due to improved activity tolerance, improved balance and improved postural control.  Patient to discharge at a wheelchair level Max Assist.   Patient's care partner is independent to  provide the necessary physical and cognitive assistance at discharge.  Reasons goals not met: pt continues to require max-mod VC for initiation of tasks, attention to tasks and physical assistance for bed mobility, transfers, standing and sitting balance. Sitting balance has improved to supervision for short time lengths however for more than a few minutes pt demonstrated need of CGA for posture VC for corrections and intermittently min A. Pt continues to demonstrated weakness in trunk, core, LLE and LUE.   Recommendation:  Patient will benefit from ongoing skilled PT services in home health setting to continue to advance safe functional mobility, address ongoing impairments in strengthening, increased/improved ROM in RUE, RLE to prevent joint tightness, decreased endurance, overall technique with bed mobility, transfers, and standing balance and sitting balance, and minimize fall risk.  Equipment: manual WC provided, hospital bed , hoyer lift  Reasons for discharge: discharge from hospital  Patient/family agrees with progress made and goals achieved: Yes  PT Discharge Precautions/Restrictions Precautions Precaution Comments: L hemi Vital Signs   Pain   Vision/Perception  Vision - Assessment Ocular Range of Motion: Restricted on the left Alignment/Gaze Preference: Gaze right Tracking/Visual Pursuits: Unable to hold eye position out of midline;Impaired - to be further tested in functional context;Decreased smoothness of horizontal tracking;Decreased smoothness of vertical tracking Perception Perception: Impaired Inattention/Neglect: Does not attend to left visual field;Does not attend to left side of body Praxis Praxis: Impaired Praxis Impairment Details: Initiation;Motor planning;Ideomotor  Cognition Overall  Cognitive Status: Impaired/Different from baseline Sustained Attention Impairment: Verbal basic;Functional basic Memory Impairment: Storage deficit;Retrieval deficit;Decreased  short term memory Awareness: Impaired Awareness Impairment: Intellectual impairment Behaviors: Perseveration Comments: left inattention, decreased attention and awareness Sensation Sensation Light Touch: Impaired by gross assessment Light Touch Impaired Details: Absent LUE;Impaired LLE Proprioception: Impaired by gross assessment Stereognosis: Impaired by gross assessment Coordination Gross Motor Movements are Fluid and Coordinated: No Fine Motor Movements are Fluid and Coordinated: No Finger Nose Finger Test: Unable to complete with LUE Motor  Motor Motor: Hemiplegia;Abnormal tone;Motor apraxia  Mobility Transfers Sit to Stand: Moderate Assistance - Patient 50-74% Squat Pivot Transfers: Maximal Assistance - Patient 25-49% Locomotion     Trunk/Postural Assessment  Postural Control Trunk Control: Poor Righting Reactions: Poor Protective Responses: Poor  Balance Dynamic Sitting Balance Dynamic Sitting - Level of Assistance: 3: Mod assist Extremity Assessment  RUE Assessment RUE Assessment: Within Functional Limits LUE Assessment Active Range of Motion (AROM) Comments: Slight tricep activation noted 1x in gravity assisted position General Strength Comments: 0/5        Junie Panning 02/16/2020, 3:46 PM

## 2020-02-16 NOTE — Progress Notes (Signed)
PHYSICAL MEDICINE & REHABILITATION PROGRESS NOTE   Subjective/Complaints:  Labwork reviewed , pt unaware of d/c     ROS:   Pt denies SOB, abd pain, CP, N/V/C/D, and vision changes    Objective:   No results found. Recent Labs    02/15/20 0605  WBC 8.6  HGB 12.4  HCT 38.9  PLT 262   Recent Labs    02/15/20 0605  NA 136  K 4.0  CL 103  CO2 23  GLUCOSE 96  BUN 19  CREATININE 0.76  CALCIUM 10.5*    Intake/Output Summary (Last 24 hours) at 02/16/2020 0825 Last data filed at 02/16/2020 0600 Gross per 24 hour  Intake 720 ml  Output 11 ml  Net 709 ml     Physical Exam: Vital Signs Blood pressure 114/74, pulse 78, temperature 97.6 F (36.4 C), temperature source Oral, resp. rate 15, weight 87.5 kg, SpO2 98 %.    General: No acute distress Mood and affect are appropriate Heart: Regular rate and rhythm no rubs murmurs or extra sounds Lungs: Clear to auscultation, breathing unlabored, no rales or wheezes Abdomen: Positive bowel sounds, soft nontender to palpation, nondistended Extremities: No clubbing, cyanosis, or edema Skin: No evidence of breakdown, no evidence of rash    Motor: LUE: 2-2+/5 proximal distal, appears unchanged LLE: 2 -/5 proximal to distal  Assessment/Plan: 1. 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   Stable for D/C today F/u PCP in 3-4 weeks F/u PM&R 2 weeks WEB EX See D/C summary See D/C instructions Care Tool:  Bathing    Body parts bathed by patient: Chest, Abdomen, Front perineal area, Right upper leg, Left upper leg, Buttocks, Face   Body parts bathed by helper: Right lower leg, Left lower leg, Right arm, Left arm     Bathing assist Assist Level: Moderate Assistance - Patient 50 - 74% (per most recent staff documentation)     Upper Body Dressing/Undressing Upper body dressing   What is the patient wearing?: Pull over shirt    Upper body assist Assist Level: Total Assistance - Patient < 25%    Lower  Body Dressing/Undressing Lower body dressing      What is the patient wearing?: Incontinence brief, Pants     Lower body assist Assist for lower body dressing: 2 Helpers     Toileting Toileting    Toileting assist Assist for toileting: 2 Helpers (per most recent staff documentation)     Transfers Chair/bed transfer  Transfers assist     Chair/bed transfer assist level: Maximal Assistance - Patient 25 - 49%     Locomotion Ambulation   Ambulation assist   Ambulation activity did not occur: Safety/medical concerns  Assist level: Maximal Assistance - Patient 25 - 49% Assistive device: Walker-hemi Max distance: 10   Walk 10 feet activity   Assist  Walk 10 feet activity did not occur: Safety/medical concerns  Assist level: Maximal Assistance - Patient 25 - 49% Assistive device: Walker-hemi   Walk 50 feet activity   Assist Walk 50 feet with 2 turns activity did not occur: Safety/medical concerns         Walk 150 feet activity   Assist Walk 150 feet activity did not occur: Safety/medical concerns         Walk 10 feet on uneven surface  activity   Assist Walk 10 feet on uneven surfaces activity did not occur: Safety/medical concerns         Wheelchair  Assist Will patient use wheelchair at discharge?: Yes Type of Wheelchair: Manual    Wheelchair assist level: Moderate Assistance - Patient 50 - 74% Max wheelchair distance: 150    Wheelchair 50 feet with 2 turns activity    Assist        Assist Level: Moderate Assistance - Patient 50 - 74%   Wheelchair 150 feet activity     Assist      Assist Level: Moderate Assistance - Patient 50 - 74%   Blood pressure 114/74, pulse 78, temperature 97.6 F (36.4 C), temperature source Oral, resp. rate 15, weight 87.5 kg, SpO2 98 %.  Medical Problem List and Plan: 1. Left hemiparesis with right gaze preference, has difficulty tracking beyond midline to the left and continues  to have limited verbal output secondary to large right MCA infarct.   Continue CIR , OT,SLP   dispo to home with daughter    PRAFO/WHO qhs 2. Antithrombotics: -DVT/anticoagulation: Pharmaceutical: Lovenox -antiplatelet therapy: On ASA/Plavix 3. Pain Management: Tylenol prn. Sports creme Left calf tenderness as well as Right LE pain  C spine xray unremarkable   8/1- denies pain- con't regimen prn 4. Mood: LCSW to follow for evaluation and support.  -antipsychotic agents: N/A 5. Neuropsych: This patient is not fully capable of making decisions on his own behalf. 6. Skin/Wound Care: Routine pressure relief measures 7. Fluids/Electrolytes/Nutrition: Monitor I/Os. Monitor I's and O's  8. HTN: Monitor BP tid--Imdur, lisinopril, cardizem and amlodipine has been on hold.  Cardizem 30 mg qid  Vitals:   02/15/20 2333 02/16/20 0537  BP: 139/78 114/74  Pulse:  78  Resp:  15  Temp:  97.6 F (36.4 C)  SpO2:  98%   Controlled 8/3 9. UTI: Resolved  10. COPD: Continue Pulmicort nebs bid. 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 To contact Dr. Katherina Right closer to discharge.  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  Sleep disturbance?: Will monitor sleep wake cycle. Was on ambien and trazodone PTA? Ambien prn for now.   Improving  14. Azotemia due to dysphagia/elevated BUN   Resolved, normal metabolic package 4/66 15. Hyperglycemia?  Resolved 16. Constipation: Resolved, hold on further PRN laxative 17.  Hx depression, has depressed affect which may be part of cognitive changes post CVA but will trial Lexapro 5mg  , increase to 10mg , monitor effect  No major imp[rovement with affect noted, CVA releated cognitive changes (R MCA with aprosodia) may not respond   LOS: 39 days A FACE TO [place] E Devaris Quirk 02/16/2020, 8:25 AM

## 2020-02-16 NOTE — Discharge Summary (Addendum)
Physician Discharge Summary  Patient ID: Anne [name] MRN: [id number] DOB/AGE: [birth year] 60 y.o.  Admit date: 01/08/2020 Discharge date: 02/16/2020  Discharge Diagnoses:  Principal Problem:   Acute right MCA stroke [facility]) Active Problems:   Elevated BUN   Sleep disturbance   Left foot pain   Hemiparesis affecting left side as late effect of stroke [facility])   Discharged Condition: stable   Significant Diagnostic Studies: DG Cervical Spine Complete  Result Date: 01/26/2020 CLINICAL DATA:  Left neck pain EXAM: CERVICAL SPINE - COMPLETE 4+ VIEW COMPARISON:  None. FINDINGS: Five view radiograph of the cervical spine is limited by poor profiling of the left neural foramina and osseous overlap on a Don toy view. However, there is normal cervical lordosis. No fracture or listhesis of the cervical spine. Vertebral body height has been preserved. There is minimal intervertebral disc space narrowing and endplate remodeling at P5-3 and C6-7 in keeping with changes of mild degenerative disc disease. The spinal canal is widely patent. The prevertebral soft tissues are unremarkable. The right neural foramina appear widely patent. Prominent vascular calcifications are seen in the region of the right carotid bifurcation. IMPRESSION: No cervical fracture or listhesis. Prominent right carotid calcification. Electronically Signed   By: Fidela Salisbury MD   On: 01/26/2020 19:32   DG Swallowing Func-Speech Pathology  Result Date: 01/25/2020 Objective Swallowing Evaluation: Type of Study: MBS-Modified Barium Swallow Study  Patient Details Name: Anne [name] MRN: [id number] Date of Birth: 12-13-1960 Today's Date: 01/25/2020 Past Medical History: Past Medical History: Diagnosis Date . Asthma  . CAD (coronary artery disease)   a. cardiac cath 02/04/2013: tubular 20% stenosis pRCA, tubular 30% stenosis mRCA, medically managed . Coronary vasospasm (HCC)   a. cardiac cath 11/25/2014: mLCx 30%, pRCA 30%, moderate spasm w/ noted  improvement w/ reimaging, recommended CCB, long acting nitrate, smoking cessation, & statin  . Hypertension  . Polysubstance abuse ([place])   a. remote history of crack/cocaine abuse approximately 8-10 years ago, ongoing tobacco abuse since age 36, rare etoh abuse . Tubular adenoma  Past Surgical History: Past Surgical History: Procedure Laterality Date . CARDIAC CATHETERIZATION N/A 11/25/2014  Procedure: Left Heart Cath and Coronary Angiography;  Surgeon: Minna Merritts, MD;  Location: [place] [facility];  Service: Cardiovascular;  Laterality: N/A; . IR CT HEAD LTD  12/31/2019 . IR PERCUTANEOUS ART THROMBECTOMY/INFUSION INTRACRANIAL INC DIAG ANGIO  12/31/2019    . IR PERCUTANEOUS ART THROMBECTOMY/INFUSION INTRACRANIAL INC DIAG ANGIO  12/31/2019 . RADIOLOGY WITH ANESTHESIA Left 12/31/2019  Procedure: RADIOLOGY WITH ANESTHESIA;  Surgeon: Radiologist, Medication, MD;  Location: [place];  Service: Radiology;  Laterality: Left; HPI: Pt is a 59 yo female presenting with L hemiplegia and R gaze preference. CT showed R MCA infarct. CTA revealed occlusion of R ICA. Pt is s/p thrombectomy on 6/17. PMH includes polysubstance abuse, HTN, coronary vasospasms, CAD, asthma. Pt admitted to [facility] 01/08/20.  Assessment / Plan / Recommendation CHL IP CLINICAL IMPRESSIONS 01/25/2020 Clinical Impression Pt presents with improvements in oropharyngeal swallow function in comparison to previous MBSS on 01/07/20, and pt's dysphagia is now characterized primarily by oral phase impairments. Although swallow initiation of thin barium occurred at the level of the pyriform sinuses, sometimes with a delay of 3-4 seconds, pt only intermittently displayed flash penetration, with all penetrates staying above the level of the vocal folds and being ejected from the vestibule during the swallow (PAS scale 2). Bolus size and/or use of straw vs cup for bolus delivery did not appear to influence  airway protection today. No aspiration observed throughout study. [name]  oral phase is characterized by weak lingual manipulation and prolonged mastication of soft solids and decreased bolus cohesion. [name] oral transit and efficiency of mastication was much improved with finely chopped dysphagia 2 solids in comparison to dysphagia 3 soft solids. Pt unable to form cohesive bolus of dual consistency barium pill with thin or barium pill whole in applesauce, therefore, would recommend medications continue to be crushed for administration. However, would also recommend pt upgrade to dysphagia 2 solid textures and thin liquids. Please provide full supervision to ensure use of safe swallow precautions and sustained attention to intake. SLP Visit Diagnosis Dysphagia, oropharyngeal phase (R13.12) Attention and concentration deficit following -- Frontal lobe and executive function deficit following -- Impact on safety and function Mild aspiration risk   CHL IP TREATMENT RECOMMENDATION 01/07/2020 Treatment Recommendations Therapy as outlined in treatment plan below   Prognosis 01/07/2020 Prognosis for Safe Diet Advancement Good Barriers to Reach Goals Cognitive deficits Barriers/Prognosis Comment -- CHL IP DIET RECOMMENDATION 01/25/2020 SLP Diet Recommendations Dysphagia 2 (Fine chop) solids;Thin liquid Liquid Administration via Cup;Straw Medication Administration Crushed with puree Compensations Slow rate;Small sips/bites;Minimize environmental distractions Postural Changes Seated upright at 90 degrees   CHL IP OTHER RECOMMENDATIONS 01/25/2020 Recommended Consults -- Oral Care Recommendations Oral care BID Other Recommendations --   CHL IP FOLLOW UP RECOMMENDATIONS 01/08/2020 Follow up Recommendations Inpatient Rehab   CHL IP FREQUENCY AND DURATION 01/07/2020 Speech Therapy Frequency (ACUTE ONLY) min 2x/week Treatment Duration 2 weeks      CHL IP ORAL PHASE 01/25/2020 Oral Phase -- Oral - Pudding Teaspoon -- Oral - Pudding Cup -- Oral - Honey Teaspoon -- Oral - Honey Cup -- Oral - Nectar Teaspoon -- Oral  - Nectar Cup -- Oral - Nectar Straw -- Oral - Thin Teaspoon -- Oral - Thin Cup -- Oral - Thin Straw -- Oral - Puree Piecemeal swallowing;Decreased bolus cohesion Oral - Mech Soft -- Oral - Regular -- Oral - Multi-Consistency -- Oral - Pill -- Oral Phase - Comment --  CHL IP PHARYNGEAL PHASE 01/25/2020 Pharyngeal Phase Impaired Pharyngeal- Pudding Teaspoon -- Pharyngeal -- Pharyngeal- Pudding Cup -- Pharyngeal -- Pharyngeal- Honey Teaspoon -- Pharyngeal -- Pharyngeal- Honey Cup -- Pharyngeal -- Pharyngeal- Nectar Teaspoon -- Pharyngeal -- Pharyngeal- Nectar Cup NT Pharyngeal -- Pharyngeal- Nectar Straw NT Pharyngeal -- Pharyngeal- Thin Teaspoon -- Pharyngeal -- Pharyngeal- Thin Cup Delayed swallow initiation-pyriform sinuses;Penetration/Aspiration during swallow Pharyngeal Material enters airway, remains ABOVE vocal cords then ejected out Pharyngeal- Thin Straw Delayed swallow initiation-pyriform sinuses Pharyngeal -- Pharyngeal- Puree Delayed swallow initiation-vallecula Pharyngeal -- Pharyngeal- Mechanical Soft Delayed swallow initiation-vallecula Pharyngeal -- Pharyngeal- Regular NT Pharyngeal -- Pharyngeal- Multi-consistency -- Pharyngeal -- Pharyngeal- Pill (No Data) Pharyngeal -- Pharyngeal Comment --  CHL IP CERVICAL ESOPHAGEAL PHASE 01/25/2020 Cervical Esophageal Phase WFL Pudding Teaspoon -- Pudding Cup -- Honey Teaspoon -- Honey Cup -- Nectar Teaspoon -- Nectar Cup -- Nectar Straw -- Thin Teaspoon -- Thin Cup -- Thin Straw -- Puree -- Mechanical Soft -- Regular -- Multi-consistency -- Pill -- Cervical Esophageal Comment -- Arbutus Leas 01/25/2020, 10:19 AM               Labs:  Basic Metabolic Panel: BMP Latest Ref Rng & Units 02/15/2020 02/08/2020 02/01/2020  Glucose 70 - 99 mg/dL 96 84 92  BUN 6 - 20 mg/dL 19 14 21(H)  Creatinine 0.44 - 1.00 mg/dL 0.76 0.78 0.87  Sodium 135 - 145 mmol/L 136 135 135  Potassium 3.5 - 5.1 mmol/L 4.0 4.1 4.1  Chloride 98 - 111 mmol/L 103 107 103  CO2 22 - 32 mmol/L 23 22  23   Calcium 8.9 - 10.3 mg/dL 10.5(H) 10.5(H) 10.9(H)    CBC: CBC Latest Ref Rng & Units 02/15/2020 02/01/2020 01/25/2020  WBC 4.0 - 10.5 K/uL 8.6 8.0 7.6  Hemoglobin 12.0 - 15.0 g/dL 12.4 13.6 12.3  Hematocrit 36 - 46 % 38.9 42.4 38.5  Platelets 150 - 400 K/uL 262 214 228    CBG: No results for input(s): GLUCAP in the last 168 hours.  Brief HPI:   Anne [name] is a 59 y.o. female with history of CAD, coronary spasms, asthma- 2 PPD smoker who was admitted on 12/31/2019 after found in full tub of water with left hemiparesis and right gaze deviation.  UDS was negative.  CTA head/perfusion showed right ICA occlusion at origin in the neck, distal right M1 occlusion and right MCA core infarct with 53 mL penumbra.  She underwent cerebral angio with mechanical thrombectomy and complete recanalization of R-MCA and ACA.  Small to moderate SAH seen in right sylvian fissure post procedure.    MRI brain showed large right MCA infarct and stroke felt to be due to large vessel disease. Dr. Leonie Man recommended ASA 325/Plavix 75 due to severe right ICA stenosis and to follow-up with interventional radiology for ICA stenting once stabilized.  Hospital course significant for UTI with fevers treated with ceftriaxone, bouts of lethargy as well as dysphagia requiring D2, nectar liquids.  She continued to have limitations right hemiplegia with right gaze preference, difficulty tracking beyond midline to the left as well as limited verbal output.  CIR was recommended due to functional decline.   Hospital Course: Jerilynn Dales was admitted to rehab 01/08/2020 for inpatient therapies to consist of PT, ST and OT at least three hours five days a week. Past admission physiatrist, therapy team and rehab RN have worked together to provide customized collaborative inpatient rehab.  Blood pressures were monitored on 3 times daily basis and Cardizem was resumed at admission due to labile blood pressures as well as tachycardia. She  was maintained on subcu Lovenox for DVT prophylaxis during his stay.   Tube feeds were maintained initially but changed to nighttime's to help with p.o. intake. She was maintained on dysphagia 2, nectar liquids with ST addressing dysphagia.  Cortak was discontinued and repeat MBS done on  7/12 and diet advanced to soft, thins but she continued to have poor intake. Megace was added to help with appetite and family advised to offer snacks or supplements between meals to help maintain adequate nutritional status.   She did develop pre-renal azotemia which has resolved with IVF for supplementation briefly. Hyperglycemia due to tube feeds has improved therefore CBG checks were discontinued as hemoglobin A1c 5.5. Lexapro was added for mood stabilization and titrated to 10 mg daily without SE.  She has had issues with neck pain as well as torticollis therefore x-rays of neck done revealing mild degenerative changes but no listhesis of fracture.  Neck pain felt to be MS in nature due to right gaze preference and has been treated with local measures as well as ROM. Respiratory status has been stable. Follow-up CBC showed that reactive leukocytosis has resolved and H&H & platelets to be relatively stable.  Follow up BMET shows that lytes and renal status WNL. She is incontinent of bowel and bladder. She has made slow and limited progress during her rehab stay due  to significant physical as well as cognitive deficits. Insurance has denied SNF or [place] services were declined due to lack of contract with provider.  Family has been educated on care needed.     Rehab course: During patient's stay in rehab weekly team conferences were held to monitor patient's progress, set goals and discuss barriers to discharge. At admission, patient required total assist with mobility and ADL tasks. She exhibited moderate to severe dysarthria, dysphagia and severe cognitive deficits.  She  has had improvement in activity tolerance, balance,  postural control as well as ability to compensate for deficits. She has had improvement in functional use LUE  and LLE as well as improvement in awareness.  She requires max assist with ADL tasks. She requires max assist with verbal cues to sit at EOB, mod to max assist for transfers and is able to propel her wheelchair for 40' with mod assist. She has made slow and minimal gains with severe cognitive deficits--SLUMS score 7/30. She requires mod assist mod to max assist for basic and familiar tasks andmax verbal cues for insight/awareness of deficits.  Hands on family education completed regarding all aspects of care and safety.   Discharge disposition: 01-Home or Self Care  Diet: Soft foods. Cardiac/diabetic restrictions.   Special Instructions: 1.Supervision with meals. Offer supplements between meals and encourage intake.    Discharge Instructions    Ambulatory referral to Physical Medicine Rehab   Complete by: As directed    1-2 weeks TC appointment     Allergies as of 02/16/2020   No Known Allergies     Medication List    STOP taking these medications   albuterol (2.5 MG/3ML) 0.083% nebulizer solution Commonly known as: PROVENTIL   albuterol 108 (90 Base) MCG/ACT inhaler Commonly known as: VENTOLIN HFA   amLODipine 5 MG tablet Commonly known as: NORVASC   aspirin 81 MG EC tablet Replaced by: aspirin 325 MG tablet   diltiazem 120 MG 24 hr capsule Commonly known as: CARDIZEM CD   diltiazem 180 MG 24 hr capsule Commonly known as: CARDIZEM CD   esomeprazole 20 MG capsule Commonly known as: NEXIUM   famotidine 20 MG tablet Commonly known as: PEPCID   isosorbide mononitrate 30 MG 24 hr tablet Commonly known as: IMDUR   metoCLOPramide 10 MG tablet Commonly known as: REGLAN   nitroGLYCERIN 0.4 MG SL tablet Commonly known as: NITROSTAT   ondansetron 4 MG disintegrating tablet Commonly known as: Zofran ODT   zolpidem 10 MG tablet Commonly known as: AMBIEN      TAKE these medications   acetaminophen 325 MG tablet Commonly known as: TYLENOL Take 2 tablets (650 mg total) by mouth every 6 (six) hours.   aspirin 325 MG tablet Take 1 tablet (325 mg total) by mouth daily. Start taking on: February 17, 2020 Replaces: aspirin 81 MG EC tablet   atorvastatin 40 MG tablet Commonly known as: LIPITOR Take 1 tablet (40 mg total) by mouth daily. Start taking on: February 17, 2020   cetirizine 10 MG tablet Commonly known as: ZYRTEC Take 10 mg by mouth daily as needed for allergies.   clopidogrel 75 MG tablet Commonly known as: PLAVIX Take 1 tablet (75 mg total) by mouth daily. Start taking on: February 17, 2020   diltiazem 30 MG tablet Commonly known as: CARDIZEM Take 1 tablet (30 mg total) by mouth every 6 (six) hours.   escitalopram 10 MG tablet Commonly known as: LEXAPRO Take 1 tablet (10 mg total) by mouth  daily. Start taking on: February 17, 2020   Fluticasone-Salmeterol 250-50 MCG/DOSE Aepb Commonly known as: ADVAIR Inhale 1 puff into the lungs 2 (two) times daily.   folic acid 1 MG tablet Commonly known as: FOLVITE Take 1 tablet (1 mg total) by mouth daily. Start taking on: February 17, 2020   lidocaine 5 % Commonly known as: LIDODERM Place 1 patch onto the skin daily. Apply at 8 am and remove at 8 pm daily. Purchase this over the counter   lisinopril 2.5 MG tablet Commonly known as: ZESTRIL Take 1 tablet (2.5 mg total) by mouth daily. Start taking on: February 17, 2020 What changed:   medication strength  how much to take   megestrol 400 MG/10ML suspension Commonly known as: MEGACE Take 10 mLs (400 mg total) by mouth 2 (two) times daily.   multivitamin with minerals Tabs tablet Take 1 tablet by mouth daily. Start taking on: February 17, 2020   Muscle Rub 10-15 % Crea Apply 1 application topically 2 (two) times daily as needed for muscle pain. To right thigh   thiamine 100 MG tablet Take 1 tablet (100 mg total) by mouth daily. Start  taking on: February 17, 2020   traZODone 50 MG tablet Commonly known as: DESYREL Take 0.5-1 tablets (25-50 mg total) by mouth at bedtime as needed for sleep. What changed:   medication strength  how much to take  when to take this  reasons to take this       Follow-up Information    Kirsteins, Luanna Salk, MD Follow up.   Specialty: Physical Medicine and Rehabilitation Why: Office will call you with follow up appointment Contact information: [place] [address] [phone]        [place]. Call.   Why: for post stroke follow up Contact information: [address]     [address] [place] [address] [phone]       Donnie Coffin, MD. Call.   Specialty: Family Medicine Why: for post hospital follow up Contact information: [place] [address] [phone]               Signed: Bary Leriche 02/16/2020, 11:59 AM

## 2020-02-16 NOTE — Progress Notes (Signed)
Patient picked up by ambulance. Patient alert and oriented per usual and ready to leave.

## 2020-02-16 NOTE — Progress Notes (Signed)
Occupational Therapy Session Note  Patient Details  Name: Anne [name] MRN: [id number] Date of Birth: 24-Feb-1961  No charge, brief conversation with pt's daughter while pt receiving nursing care.    Short Term Goals: Week 1:  OT Short Term Goal 1 (Week 1): Pt will complete sit<>stand for ADL with max A. OT Short Term Goal 1 - Progress (Week 1): Not met OT Short Term Goal 2 (Week 1): Pt will locate 3 self-care items on L side with mod cues. OT Short Term Goal 2 - Progress (Week 1): Not met OT Short Term Goal 3 (Week 1): Pt will don shirt with mod A using hemi dressing techniques. OT Short Term Goal 3 - Progress (Week 1): Not met OT Short Term Goal 4 (Week 1): Pt will demonstrate static sitting balance for 30+ seconds with CGA. OT Short Term Goal 4 - Progress (Week 1): Met Week 2:  OT Short Term Goal 1 (Week 2): Pt will transfer onto commode with assist of 1 person. OT Short Term Goal 1 - Progress (Week 2): Not met OT Short Term Goal 2 (Week 2): Pt will don UB clothing with mod A overall. OT Short Term Goal 2 - Progress (Week 2): Met OT Short Term Goal 3 (Week 2): Pt will locate 3 self care items to the L with mod cuing. OT Short Term Goal 3 - Progress (Week 2): Met Week 3:  OT Short Term Goal 1 (Week 3): STGs=LTGs secondary to upcoming discharge OT Short Term Goal 1 - Progress (Week 3): Not met Week 4:  OT Short Term Goal 1 (Week 4): STGs=LTGs secondary to upcoming discharge OT Short Term Goal 1 - Progress (Week 4): Progressing toward goal Week 5:  OT Short Term Goal 1 (Week 5): STG=LTG OT Short Term Goal 2 (Week 5): Pt will tolerate sitting EOB with min A for 5 min in prep for ADL tasks OT Short Term Goal 3 (Week 5): Pt will don shirt in supported seated position with mod A OT Short Term Goal 4 (Week 5): Pt will perform basic stand pivot transfer with max A +1 consistantly  Skilled Therapeutic Interventions/Progress Updates:    Pt scheduled for family education for 30 minutes  today.  Upon entering the room, pt being placed on bed pan by nursing staff. Pt needed to toilet and asked [place] to step outside.   Spoke with daughter about going home and she feels ready to take her home and did not have any other questions or concerns as she participated in family education last week.    Therapy Documentation Precautions:  Precautions Precautions: Other (comment) Precaution Comments: L hemi Restrictions Weight Bearing Restrictions: No                Therapy/Group: Individual Therapy  [place] 02/16/2020, 11:51 AM

## 2020-02-17 ENCOUNTER — Telehealth: Payer: Self-pay | Admitting: Registered Nurse

## 2020-02-17 DIAGNOSIS — I63511 Cerebral infarction due to unspecified occlusion or stenosis of right middle cerebral artery: Secondary | ICD-10-CM

## 2020-02-17 NOTE — Progress Notes (Signed)
Patient ID: Brittain Smithey, female   DOB: April 14, 1961, 59 y.o.   MRN: [id number]  This SW covering for primary  Elizabeth.   02/15/2020-SW faxed PCS referral to [employer] [phone]).  8/3- SW spoke with [employer] stating referral was declined due to managed Medicaid and would need to get approval through insurance for aide service.   Pt daughter preferred for pt to have medical transport home. [facility] ambulance pick up scheduled for 12:30pm. SW met with pt and pt dtr in room to discuss above. SW informed will continue to work on establishing home health for pt. She is aware that there are challenges with HH due to managed care of insurance.SW informed we will continue to follow pt to make efforts to establish [facility] services. When discussing with dtr if she feels she is able to manage patient, she reports she is able to manage.   Loralee Pacas, MSW, [place] Office: [phone] Cell: [phone] Fax: [phone]

## 2020-02-17 NOTE — Telephone Encounter (Signed)
Received a phone call from [facility] requesting for a [facility] referral for Anne [name]. Referral placed.

## 2020-02-18 NOTE — Progress Notes (Addendum)
Patient ID: Anne [name], female   DOB: Jul 21, 1960, 59 y.o.   MRN: [id number]   Sw still attempting to link patient with HH:  [facility] unable to take patient due to not being contracted with patient insurance company.  [facility] unable to take patient due to insurance not being in network.  SW following up with [facility] and [facility]  Update 8/5/201:  [facility] unable to take patient due to staffing being completely full in pt area.  [facility] unable to take patient payer at the moment.

## 2020-02-19 ENCOUNTER — Other Ambulatory Visit: Payer: Self-pay | Admitting: *Deleted

## 2020-02-19 ENCOUNTER — Encounter: Payer: Self-pay | Admitting: *Deleted

## 2020-02-19 NOTE — Patient Outreach (Signed)
Care Coordination - Case Manager  02/20/2020  Anne [name] [birth year] [id number]  Subjective:  Anne [name] is an 59 y.o. year old female who is a primary patient of Aycock, Ngwe A, MD.  Anne [name] was given information about Medicaid Managed Care team care coordination services today. Anne [name] agreed to services and verbal consent obtained  Review of patient status, laboratory and other test data was performed as part of evaluation for provision of services.  Her daughter reports "she has been depressed and we have been trying everything to cheer her up" -on Lexapro 10 mg po daily discharged home to family via ambulance on 02/16/20 without home health and personal care services  Anne [name] states she was informed patient could not be transported via her personal automobile and is inquiring how to get patient to outpatient medical appointments Anne [name] confirms DME in the home- hospital bed, wheelchair, hoyer lift from [employer], daughter reports pending calls from hospital staff to establish home services (especially for the mornings) pending coverage verification/authorization Anne [name] reports skilled nursing facility via insurance coverage was not found prior to hospital discharge Anne [name] reports she has taken off work this week to assist patient at home Anne [name] reports she has called pcp office for assistance with medication, home services- Anne [name] informed that Paulita Cradle is out of the office Anne [name] reports she feels patient and family will be safe at home pending start of home services Patient continues with left sided weakness and is dependent for all care needs Reported has bruise from injection administration  SDOH: SDOH Screenings   Alcohol Screen:    Last Alcohol Screening Score (AUDIT):   Depression (PHQ2-9): Medium Risk   PHQ-2 Score: 10  Financial Resource Strain:    Difficulty of Paying Living Expenses:   Food Insecurity: No Food  Insecurity   Worried About Charity fundraiser in the Last Year: Never true   Ran Out of Food in the Last Year: Never true  Housing:    Last Housing Risk Score:   Physical Activity:    Days of Exercise per Week:    Minutes of Exercise per Session:   Social Connections:    Frequency of Communication with Friends and Family:    Frequency of Social Gatherings with Friends and Family:    Attends Religious Services:    Active Member of Clubs or Organizations:    Attends Music therapist:    Marital Status:   Stress:    Feeling of Stress :   Tobacco Use: High Risk   Smoking Tobacco Use: Current Every Day Smoker   Smokeless Tobacco Use: Never Used  Transportation Needs: Public librarian (Medical): Yes   Lack of Transportation (Non-Medical): Yes   SDOH Interventions     Most Recent Value  SDOH Interventions  SDOH Interventions for the Following Domains Depression, Transportation  Food Insecurity Interventions Intervention Not Indicated  Transportation Interventions Other (Comment)  [THN SW referral ordered, discharged home to family via ambulance]  Depression Interventions/Treatment  --  [on Lexapro 10 mg po daily]      Objective:   No Known Allergies  Medications:  Has all medications except Advair in the home but Anne [name] has called the primary care provider (PCP) office to get assistance with this medication She does have her Albuterol prn emergency medication in the home  No vaccinations received   Medications Reviewed Today    Reviewed by Payton Doughty, CPhT (Pharmacy Technician) on  01/08/20 at 1730  Med List Status: Home Meds Unknown  Medication Order Taking? Sig Documenting Provider Last Dose Status Informant  albuterol (PROVENTIL HFA;VENTOLIN HFA) 108 (90 BASE) MCG/ACT inhaler [id number]  Inhale 2 puffs into the lungs every 6 (six) hours as needed for wheezing or shortness of breath. Enid Baas,  MD  Active Child           Med Note Vickey Sages, Macarthur Critchley   Fri Jan 01, 2020  7:37 PM) found at pt's house   albuterol (PROVENTIL) (2.5 MG/3ML) 0.083% nebulizer solution [id number]  Take 3 mLs (2.5 mg total) by nebulization every 6 (six) hours as needed for wheezing or shortness of breath.  Patient not taking: Reported on 01/01/2020   Enid Baas, MD  Active   amLODipine (NORVASC) 5 MG tablet [id number]  Take 1 tablet (5 mg total) by mouth daily. Sharman Cheek, MD  Active   aspirin EC 81 MG EC tablet [id number]  Take 1 tablet (81 mg total) by mouth daily.  Patient not taking: Reported on 11/03/2016   Enid Baas, MD  Active   cetirizine (ZYRTEC) 10 MG tablet [id number]  Take 10 mg by mouth daily as needed for allergies.  [provider]  Active   diltiazem (CARDIZEM CD) 120 MG 24 hr capsule [id number]  Take 1 capsule (120 mg total) by mouth daily.  Patient not taking: Reported on 11/03/2016   Enid Baas, MD  Active   diltiazem (CARDIZEM CD) 180 MG 24 hr capsule [id number]  Take 180 mg by mouth daily. [provider]  Active Child           Med Note Vickey Sages, Macarthur Critchley   Fri Jan 01, 2020  7:37 PM) Bottle found at pt's house   esomeprazole (NEXIUM) 20 MG capsule [id number]  Take 20 mg by mouth daily at 12 noon. [provider]  Active   famotidine (PEPCID) 20 MG tablet [id number]  Take 1 tablet (20 mg total) by mouth 2 (two) times daily. Sharman Cheek, MD  Active   Fluticasone-Salmeterol (ADVAIR) 250-50 MCG/DOSE Madelin Headings [id number]  Inhale 1 puff into the lungs 2 (two) times daily.  Patient not taking: Reported on 11/03/2016   Enid Baas, MD  Active   isosorbide mononitrate (IMDUR) 30 MG 24 hr tablet [id number]  Take 1 tablet (30 mg total) by mouth daily. Sharman Cheek, MD  Active   lisinopril (PRINIVIL,ZESTRIL) 10 MG tablet [id number]  Take 10 mg by mouth daily. [provider]  Active   metoCLOPramide (REGLAN) 10 MG tablet [id number]  Take  1 tablet (10 mg total) by mouth every 6 (six) hours as needed. Sharman Cheek, MD  Active   nitroGLYCERIN (NITROSTAT) 0.4 MG SL tablet [id number]  Place 1 tablet (0.4 mg total) under the tongue every 5 (five) minutes as needed for chest pain. Enid Baas, MD  Active Child           Med Note Vickey Sages, Macarthur Critchley   Fri Jan 01, 2020  7:37 PM) Bottle found at pt's house   ondansetron (ZOFRAN ODT) 4 MG disintegrating tablet [id number]  Take 1 tablet (4 mg total) by mouth every 8 (eight) hours as needed for nausea or vomiting. Sharman Cheek, MD  Active   traZODone (DESYREL) 100 MG tablet [id number]  Take 100 mg by mouth at bedtime. [provider]  Active Child           Med Note Gorden Harms   Fri Jan 01, 2020  7:37 PM)  Bottle found at pt's house   zolpidem (AMBIEN) 10 MG tablet [id number]  Take 10 mg by mouth at bedtime. [provider]  Active Child           Med Note Payton Doughty   Fri Jan 01, 2020  7:38 PM) #30 filled 11/30/2019 [place] per PMP AWARE - Bottle found at pt's house          Past Medical History:  Diagnosis Date   Asthma    CAD (coronary artery disease)    a. cardiac cath 02/04/2013: tubular 20% stenosis pRCA, tubular 30% stenosis mRCA, medically managed   Coronary vasospasm (HCC)    a. cardiac cath 11/25/2014: mLCx 30%, pRCA 30%, moderate spasm w/ noted improvement w/ reimaging, recommended CCB, long acting nitrate, smoking cessation, & statin    Hypertension    Polysubstance abuse ([place])    a. remote history of crack/cocaine abuse approximately 8-10 years ago, ongoing tobacco abuse since age 27, rare etoh abuse   Tubular adenoma    Patient Active Problem List   Diagnosis Date Noted   Hemiparesis affecting left side as late effect of stroke ([place])    Elevated BUN    Sleep disturbance    Left foot pain    Acute right MCA stroke ([place]) 01/08/2020   Acute lower UTI    Left hemiplegia (HCC)     Acute ischemic stroke ([place])    Dysphagia, post-stroke    Hyperglycemia    Acute respiratory failure with hypoxia ([place])    Stroke (cerebrum) ([place]) 12/31/2019   Polysubstance abuse ([place]) 07/18/2018   Coronary artery disease of native artery of native heart with stable angina pectoris ([place]) 04/15/2018   Other nonspecific abnormal finding of lung field 04/15/2018   Insomnia, unspecified 11/14/2016   CAD (coronary artery disease)    NSTEMI (non-ST elevated myocardial infarction) ([place]) 11/25/2014   Chest pain 11/24/2014   Major depressive disorder, recurrent episode, moderate degree ([place]) 11/10/2014   Family history of colon cancer 12/17/2013   Adenomatous polyp 12/17/2013   Hypertension 10/21/2013   Other disorders of lung 09/22/2013   Chronic obstructive pulmonary disease with (acute) exacerbation ([place]) 07/07/2013   Allergic conjunctivitis 03/13/2012   Acid reflux disease 12/11/2007   Asthma 09/24/2007   Cigarette smoker 09/24/2007    Assessment:  THN Telephone Assessment/Screen for MD & Managed Medicare referral Referral Date:02/18/20 Referral Source: received a phone call from [place] requesting for a [facility] referral for Anne. Sparr. Referral placed- Danella Sensing LPN, phys Medicine Referral Reason: Stroke Insurance: medicaid managed [place] healthy blue  [place] admissions x 2 with ED visit x 1 in the last 6 months  01/08/20-02/16/20 [place] inpatient rehab program 12/31/19 -01/08/20 ED/admission- acute ischemic stroke-Rt MCA territory infarct due to right ICA and MCA and ACA occlusion s/p EVT with TIC13 reperfusion  Outreach attempt #1 successful to the patient's home/mobile number with noted issues with reception Calls X 4 dropped + audio issues + lots of background noises to include children/family members, tv  Patient is able to verify HIPAA, DOB and address She responses to [facility] RN CM with a delay Reviewed and addressed referral to [facility] with patient &  Daughter Anne [name] Anne [name] verified HIPPA (her name, date of birth and [place] as city of residence) and gave permission for [facility] RN CM to speak with her daughter, Anne [name] Request made and completed to change primary outreach number to patient/daughter via daughter mobile number in  Epic   DME: Wheelchair (W/C) , hoyer lift, hospital bed  Appointments: No PCP or neurology follow up appointments scheduled  Pending transportation resource to get to medical appointment considering pt discharged home via ambulance vs personal automobile per daughter 03/08/20 [facility]   Advance Directives: None Consent: THN RN CM reviewed [facility] services with patient. Patient/daughter gave verbal consent for services [facility] telephonic RN CM and THN SW.  THN RN CM interventions THN RN CM began transition of care assessment THN RN CM discussed with Anne [name] during the assessment medicaid guidelines, order needs for home health services, pcp on call services, THN SW services,MD home visits, [facility] RN CM outreached to [facility] on call medical services and was connected with Bubba Camp, FNP by [place] Ropes. Reviewed concerns identified during the assessment. Requested assist with orders for home services.   Seth Bake returned call to [facility] RN CM to update that verbal orders were given to [facility] health senior care Janett Billow at [phone]) with pending services possibly at end of next week Note sent to Dr Clide Deutscher by Seth Bake   [facility] RN CM ordered [facility] SW referral for -Anne Rumery was discharged from [facility] on 02/16/20 via ambulance after tx for CVA with left sided weakness  to home in the care of her daughter, Anne [name] [facility] SW assistance for follow up on PCS via [place], [employer] support system resources, transportation resources to outpatient medical appointments, depression resources and evaluation of other social determinant needs. Her daughter reports "she has been  depressed and we have been trying everything to cheer her up"  Managed Medicaid [place] Healthy Blue coverage Please see RN CM note for further details.  THN RN CM left a message for daughter after contact with Baltazar Najjar and updated her on verbal orders to [facility] health senior care, possible in home assessment/services at end of next week and answered questions [facility] RN CM discussed action plan for worsening stroke signs and symptoms to include increase prolong headache, chest pain, breathing issues confusion, increased weakness, vision changes Encouraged to seek medical attention prn   Plan:  [facility] RN CM will follow up with Anne Colan and family within the next 2-7 business days for further assessment, and assistance with care coordination of home services  [name] encouraged to return a call to [facility] RN CM prn [facility] RN CM sent a Gaffer with [facility] brochure, [employer], [facility] consent form with return envelope and know before you go sheet enclosed for review [facility] MD involvement barriers letter sent   Goals Addressed              This Visit's Progress     Patient Stated     and family will be able to manage stroke and hypertension at home (pt-stated)        CARE PLAN ENTRY (see longitudinal plan of care for additional care plan information)  Objective:   Last practice recorded BP readings:  BP Readings from Last 3 Encounters:  02/16/20 114/74  01/08/20 (!) 159/92  11/03/16 (!) 185/94     Most recent eGFR/CrCl: No results found for: EGFR  No components found for: CRCL  Current Barriers:   Knowledge deficit related to self care management of hypertension  Difficulty obtaining medications  Transportation barriers  Cognitive Deficits  Limited Social Support  Limited physical activity barriers  Knowledge deficit related to self care management of stroke  Case Manager Clinical Goal(s):   Over the next 31  days, patient will not experience hospital admission. Hospital  Admissions in last 6 months = 2  Over the next 90 days, patient will verbalize basic understanding of hypertension disease process and self health management plan as evidenced by BP<140/90  Over the next 14 days patient/family will verbalize initiation of home services (HH/PCS)  Over the next 30 days patient/family will verbalize knowledge of transportation and depression resources  Interventions:   Evaluation of current treatment plan related to hypertension self management and patient's adherence to plan as established by provider.  Discussed plans with patient for ongoing care management follow up and provided patient with direct contact information for care management team  Provided education regarding s/s of stroke and stroke prevention  Transition of care assessment/evaluation of treatment plan related  to stroke self management and patient's adherence to plan as established by provider  Assess for home safety until initiation of services  Outreach to pcp office on call staff Baltazar Najjar on 02/19/20 for orders for verbal home health services and initiation of pending home services via [facility] health senior care [facility] [phone])  Referral to [facility] SW for assistance with local social support, transportation & depression resources, follow up on PCS services and evaluation of other social determinant of health needs  Update patient/family & answer questions  Reviewed action plan to seek medical assistance for worsening signs and symptoms    Patient Self Care Activities:   UNABLE to independently bathe, dress, mobility, administer medications  Calls provider office for new concerns, questions, or BP outside discussed parameters  Adheres to a low sodium diet/DASH diet  Verbalize where to go to receive medical care  Initial goal documentation         Cohen Boettner L. [name], RN, BSN, [place] Coordinator Office number [phone] Mobile number  [phone]  Main THN number [phone] Fax number [phone]

## 2020-02-22 ENCOUNTER — Encounter: Payer: Self-pay | Admitting: *Deleted

## 2020-02-22 ENCOUNTER — Other Ambulatory Visit: Payer: Self-pay | Admitting: *Deleted

## 2020-02-22 ENCOUNTER — Other Ambulatory Visit: Payer: Self-pay

## 2020-02-22 NOTE — Patient Outreach (Signed)
Care Coordination  02/22/2020  Anne [name] [birth year] [id number]  [place] coordination and [facility] outreach to pt/family   Anne [name] with Anne [name] at [facility] [phone]  To inquire about assist for patient  Anne [name] to give a message to [facility] to have a return call to [place] returned a call from [phone] she confirms Anne [name] provides service to [facility] Medicaid Healthy Blue patients She requests clinicals and order to be faxed to her at [phone]  Outreach to PCP/HH orders [facility] RN CM called [facility] [phone] and spoke with  Anne [name] who confirms she has spoken with daughter, Anne [name] sees pt does have a referral for home health listed  Discussed [facility] agreeing to receive referral sent  [facility] RN CM provided [facility] contact numbers and fax number Anne [name] to send a note to Anne [name] (out of the office today) to process the referral on 02/23/20 [facility] RN CM followed up on 02/19/20 on call outreach to the pcp office & interventions  Had Anne [name] to updated Anne [name] that the 02/19/20 agency, [place] [facility], will not be able to assist as confirmed today by [facility] RN CM via telephone, pt with issues getting advair from [employer] and [facility] SW ordered for medical appointment transportation resources [facility] RN CM office number prn No scheduled pcp appointments at this time per [employer] to [place]- pt pharmacy [facility] RN CM called and spoke with Izora Gala at [employer] [phone] about  [place], advair  Izora Gala reports she had one on hold but it had expired as the patient did not pick it up from the pharmacy. Izora Gala confirms Anne [name] need to send another Advair order in to [place] to be filled and processed for pt family to pick up   [facility] RN CM outreached to daughter, Anne [name] to update her  Anne [name], daughter, is able to verify HIPAA ([place] and Accountability Act) identifiers, date of birth (DOB) and address Reviewed and addressed the purpose of the follow up call with the patient's daughter  Anne [name] reports Anne [name] was more "sleepy today" did not want to be active and stayed in her bed. Anne [name] reports Anne [name] continues to tell Anne [name] she is "glad to be home"    Consent: THN ([place]) RN CM reviewed [place] with patient. Patient gave verbal consent for services.  Anne [name] confirms she moved back in with her mother upon discharge to assist Anne [name] confirms Anne Soeder lives in section 8 housing Social determinants of health assessments assessed- No interventions needed as not concerns assessed   [facility] RN CM reviewed all outreaches today to home car agencies, pcp and pharmacy with Anne [name]  [facility] RN CM answered questions for clarity and provided her with contact numbers for [facility] and [facility] SW.  Anne [name] agrees that patient still would benefit from [place] for possible counselor/depression resources  Plan TH RN CM will follow up with patient and daughter within the next 4-7 business days Pt encouraged to return a call to [facility] RN CM prn Routed note to MD Goals Addressed              This Visit's Progress     Patient Stated   .  and family will be able to manage stroke and hypertension at home (pt-stated)  CARE PLAN ENTRY (see longitudinal plan of care for additional care plan information)  Objective:  . Last practice recorded BP readings:  BP Readings from Last 3 Encounters:  02/16/20 114/74  01/08/20 (!) 159/92  11/03/16 (!) 185/94 .   Marland Kitchen Most recent eGFR/CrCl: No results found for: EGFR  No components found for: CRCL  Current Barriers:  Marland Kitchen Knowledge deficit related to self care management of hypertension . Difficulty obtaining medications . Transportation barriers . Cognitive Deficits . Limited Social Support . Limited physical activity  barriers . Knowledge deficit related to self care management of stroke  Case Manager Clinical Goal(s):  Marland Kitchen Over the next 31 days, patient will not experience hospital admission. Hospital Admissions in last 6 months = 2 . Over the next 90 days, patient will verbalize basic understanding of hypertension disease process and self health management plan as evidenced by BP<140/90 . Over the next 14 days patient/family will verbalize initiation of home services (HH/PCS)- progressing, located [facility] who accepts [place] medicaid healthy blue coverage . Over the next 30 days patient/family will verbalize knowledge of transportation and depression resources  Interventions:  . Evaluation of current treatment plan related to hypertension self management and patient's adherence to plan as established by provider. . Discussed plans with patient for ongoing care management follow up and provided patient with direct contact information for care management team . Provided education regarding s/s of stroke and stroke prevention . Transition of care assessment/evaluation of treatment plan related  to stroke self management and patient's adherence to plan as established by provider . Assess for home safety until initiation of services . Outreach to [employer] office on call staff Baltazar Najjar on 02/19/20 for orders for verbal home health services and initiation of pending home services via [facility] senior care [facility]) . Referral to [facility] SW for assistance with local social support, transportation & depression resources, follow up on PCS services and evaluation of other social determinant of health needs . Update patient/family & answer questions . Reviewed action plan to seek medical assistance for worsening signs and symptoms  . Various calls to insurance customer services and agencies to find an in network [facility] agency . Collaboration with pcp office and pt  pharmacy staff   Patient Self Care Activities:   . UNABLE to independently bathe, dress, mobility, administer medications . Calls provider office for new concerns, questions, or BP outside discussed parameters . Adheres to a low sodium diet/DASH diet . Verbalize where to go to receive medical care . Daughter sets up and administer medications to patient  Initial goal documentation          Carlethia Mesquita L. [name], RN, BSN, [place] Coordinator Office number [phone] Mobile number [phone]  Main THN number [phone] Fax number [phone]

## 2020-02-22 NOTE — Patient Outreach (Signed)
[place] [facility]) Care Management  02/22/2020  Rosella Hijazi 09/22/1960 [id number]    [place] coordination pharmacy  Call attempt to [place] at [phone]  No answer Call to [place] [facility] [phone] No one is aware of a Debra at this office   Follow up with [facility] SW staff Spoke with Hilda Blades at [phone] who works with [place] MSW, [place] to review patient case and to be informed that various home health agencies and skilled nursing facility (snf) were outreach to without success Confirmed also that [facility] also declined Managed medicaid coverage  Attempt outreaches to possible [place] blue healthy home health agencies [facility] RN CM began to called a list of online listed in network home health agencies from the [place] healthy blue site Spoke with dellacie at [place] who is unsure if services can be provided for this [place] blue healthy covered patient and will send a note to the nurse Melissa with [facility] RN CM office number for a return call    Clarification of correct in network agencies that cover [place] blue healthy patients [facility] RN CM called  Healthy blue Member Services: [phone] Spoke with Roschelle to get a list of in network home health agencies  She provide [facility], [facility],  [facility], [facility] and [facility]   Further attempts to reach in [place] RN CM will follow up with pt/famioy within 4- 7 business days  Kyler Lerette L. [name], RN, BSN, [place] Coordinator Office number [phone] Mobile number [phone]  Main [facility]N number [phone] Fax number [phone]

## 2020-02-22 NOTE — Patient Outreach (Signed)
[facility]) Care Management  02/22/2020  Anne [name] September 09, 1960 [id number]   [place] coordination- [facility] services outreach  Spoke with April who confirms a call from an on call provider 02/19/20 but confirms no availability to provide services to [name] at this time    Plan Continue to outreach to in network listed providers  Follow up with pt/daughter within 2-7 business days  Jordan L. [name], RN, BSN, [place] Coordinator Office number [phone] Mobile number [phone]  Main [facility] number [phone] Fax number [phone]

## 2020-02-23 ENCOUNTER — Other Ambulatory Visit: Payer: Self-pay | Admitting: *Deleted

## 2020-02-23 NOTE — Patient Outreach (Signed)
[facility]) Care Management  02/23/2020  Anne [name] Feb 18, 1961 [id number]   Pt/family Outreach to [place]   Patient's daughter, Anne [name] and patient called [facility] RN CM  Anne [name] is able to verify HIPAA, DOB and Address  Consent: [facility] RN CM reviewed [facility] services with patient. Patient gave verbal consent for [facility] telephonic RN CM services.  Voiced concern- right groin raised area and constipation Anne [name] reports Ms Goedken is having mild pain when lying on her right side with a noted raised quarter size area that is redden and tender to touch near her pubic or "bikini line" area Ms Manzella is not listed with this concern or a diagnosis of a hernia on her discharge summary nor active problem list With touching the area it can be palpated without lots of pain per daughter With bowel elimination assessment, Anne [name] reports Ms Vaness did have a very good stool on Sunday night 02/21/20 after use of milk of magnesium but did not have a stool on Monday 02/22/20 nor today 02/23/20 yet.  THN RN CM noted no bowel elimination medications on the medication list and discussed routine and prn stool softeners/laxative. Ms Cicio is familiar with Colace as she states in the background that Colace makes her have a bowel movement. [facility]N RN CM also discussed intake of more vegetables versus only meats plus increasing intake of fluids to include warm teas, coffee, etc [name] reports Ms Hanrahan has better success with having a stool while on the  bedside commode [name] purchased out of pocket vs the bedpan. She has mainly had incontinence of bowel and bladder. Anne [name] discusses Ms Rolph is able to stand to get to the bedside commode with assistance but presently does not have a walker to hold on to.      THN RN CM interventions THN RN CM discussed the right bulging area, discussed hernias, evaluated for last stool, discussed home interventions for bowel elimination and provided the 24  hour nurse call center number  Encouraged not to lie on her right side  Discussed the importance of having stools every day or every other day  Discussed the importance of seeking medical assistance if no stool on today or the pain intensity increases related to prevention of other risks of a herniated area  [name] and Ms Symonette voiced understanding   Presently Anne [name] has not had an outreach from the primary care provider (PCP) office staff related to home services   Plan [facility] RN CM will follow up with Ms Towson and family within the next 1-3 business days Pt encouraged to return a call to [facility] RN CM prn Routed note to MD  [place]. [name], RN, BSN, [place] Coordinator Office number [phone] Mobile number [phone]  Main [facility]N number [phone] Fax number [phone]

## 2020-02-24 ENCOUNTER — Inpatient Hospital Stay
Admission: EM | Admit: 2020-02-24 | Discharge: 2020-03-16 | DRG: 175 | Disposition: E | Payer: Medicaid Other | Source: Skilled Nursing Facility | Attending: Family Medicine | Admitting: Family Medicine

## 2020-02-24 ENCOUNTER — Other Ambulatory Visit: Payer: Self-pay

## 2020-02-24 ENCOUNTER — Encounter: Payer: Self-pay | Admitting: Emergency Medicine

## 2020-02-24 ENCOUNTER — Telehealth: Payer: Self-pay

## 2020-02-24 ENCOUNTER — Emergency Department: Payer: Medicaid Other

## 2020-02-24 ENCOUNTER — Encounter: Payer: Self-pay | Admitting: *Deleted

## 2020-02-24 ENCOUNTER — Other Ambulatory Visit: Payer: Self-pay | Admitting: *Deleted

## 2020-02-24 ENCOUNTER — Encounter: Payer: Self-pay | Admitting: Internal Medicine

## 2020-02-24 DIAGNOSIS — I2699 Other pulmonary embolism without acute cor pulmonale: Secondary | ICD-10-CM | POA: Diagnosis not present

## 2020-02-24 DIAGNOSIS — R627 Adult failure to thrive: Secondary | ICD-10-CM | POA: Diagnosis present

## 2020-02-24 DIAGNOSIS — Z66 Do not resuscitate: Secondary | ICD-10-CM | POA: Diagnosis not present

## 2020-02-24 DIAGNOSIS — Z515 Encounter for palliative care: Secondary | ICD-10-CM | POA: Diagnosis not present

## 2020-02-24 DIAGNOSIS — Z6827 Body mass index (BMI) 27.0-27.9, adult: Secondary | ICD-10-CM

## 2020-02-24 DIAGNOSIS — I5033 Acute on chronic diastolic (congestive) heart failure: Secondary | ICD-10-CM | POA: Diagnosis present

## 2020-02-24 DIAGNOSIS — I25118 Atherosclerotic heart disease of native coronary artery with other forms of angina pectoris: Secondary | ICD-10-CM | POA: Diagnosis present

## 2020-02-24 DIAGNOSIS — I252 Old myocardial infarction: Secondary | ICD-10-CM | POA: Diagnosis not present

## 2020-02-24 DIAGNOSIS — Z79899 Other long term (current) drug therapy: Secondary | ICD-10-CM

## 2020-02-24 DIAGNOSIS — E785 Hyperlipidemia, unspecified: Secondary | ICD-10-CM | POA: Diagnosis present

## 2020-02-24 DIAGNOSIS — J441 Chronic obstructive pulmonary disease with (acute) exacerbation: Secondary | ICD-10-CM | POA: Diagnosis present

## 2020-02-24 DIAGNOSIS — I509 Heart failure, unspecified: Secondary | ICD-10-CM

## 2020-02-24 DIAGNOSIS — Z7982 Long term (current) use of aspirin: Secondary | ICD-10-CM

## 2020-02-24 DIAGNOSIS — J9601 Acute respiratory failure with hypoxia: Secondary | ICD-10-CM | POA: Diagnosis present

## 2020-02-24 DIAGNOSIS — I69354 Hemiplegia and hemiparesis following cerebral infarction affecting left non-dominant side: Secondary | ICD-10-CM

## 2020-02-24 DIAGNOSIS — I10 Essential (primary) hypertension: Secondary | ICD-10-CM | POA: Diagnosis present

## 2020-02-24 DIAGNOSIS — A419 Sepsis, unspecified organism: Secondary | ICD-10-CM | POA: Diagnosis present

## 2020-02-24 DIAGNOSIS — Z7189 Other specified counseling: Secondary | ICD-10-CM | POA: Diagnosis not present

## 2020-02-24 DIAGNOSIS — J9602 Acute respiratory failure with hypercapnia: Secondary | ICD-10-CM | POA: Diagnosis present

## 2020-02-24 DIAGNOSIS — R0902 Hypoxemia: Secondary | ICD-10-CM

## 2020-02-24 DIAGNOSIS — Z7902 Long term (current) use of antithrombotics/antiplatelets: Secondary | ICD-10-CM | POA: Diagnosis not present

## 2020-02-24 DIAGNOSIS — F17210 Nicotine dependence, cigarettes, uncomplicated: Secondary | ICD-10-CM | POA: Diagnosis present

## 2020-02-24 DIAGNOSIS — I110 Hypertensive heart disease with heart failure: Secondary | ICD-10-CM | POA: Diagnosis present

## 2020-02-24 DIAGNOSIS — I2692 Saddle embolus of pulmonary artery without acute cor pulmonale: Secondary | ICD-10-CM | POA: Diagnosis present

## 2020-02-24 DIAGNOSIS — F329 Major depressive disorder, single episode, unspecified: Secondary | ICD-10-CM | POA: Diagnosis present

## 2020-02-24 DIAGNOSIS — I959 Hypotension, unspecified: Secondary | ICD-10-CM | POA: Diagnosis not present

## 2020-02-24 DIAGNOSIS — K219 Gastro-esophageal reflux disease without esophagitis: Secondary | ICD-10-CM | POA: Diagnosis present

## 2020-02-24 DIAGNOSIS — N179 Acute kidney failure, unspecified: Secondary | ICD-10-CM | POA: Diagnosis not present

## 2020-02-24 DIAGNOSIS — Z20822 Contact with and (suspected) exposure to covid-19: Secondary | ICD-10-CM | POA: Diagnosis present

## 2020-02-24 DIAGNOSIS — I2510 Atherosclerotic heart disease of native coronary artery without angina pectoris: Secondary | ICD-10-CM | POA: Diagnosis present

## 2020-02-24 DIAGNOSIS — E46 Unspecified protein-calorie malnutrition: Secondary | ICD-10-CM | POA: Diagnosis present

## 2020-02-24 HISTORY — DX: Cerebral infarction, unspecified: I63.9

## 2020-02-24 LAB — COMPREHENSIVE METABOLIC PANEL
ALT: 33 U/L (ref 0–44)
AST: 16 U/L (ref 15–41)
Albumin: 3.7 g/dL (ref 3.5–5.0)
Alkaline Phosphatase: 62 U/L (ref 38–126)
Anion gap: 9 (ref 5–15)
BUN: 17 mg/dL (ref 6–20)
CO2: 22 mmol/L (ref 22–32)
Calcium: 10.2 mg/dL (ref 8.9–10.3)
Chloride: 106 mmol/L (ref 98–111)
Creatinine, Ser: 0.76 mg/dL (ref 0.44–1.00)
GFR calc Af Amer: >60 mL/min (ref >60)
GFR calc non Af Amer: >60 mL/min (ref >60)
Glucose, Bld: 88 mg/dL (ref 70–99)
Potassium: 4.2 mmol/L (ref 3.5–5.1)
Sodium: 137 mmol/L (ref 135–145)
Total Bilirubin: 0.8 mg/dL (ref 0.3–1.2)
Total Protein: 7.2 g/dL (ref 6.5–8.1)

## 2020-02-24 LAB — CBC
HCT: 42 % (ref 36.0–46.0)
Hemoglobin: 13.4 g/dL (ref 12.0–15.0)
MCH: 27.7 pg (ref 26.0–34.0)
MCHC: 31.9 g/dL (ref 30.0–36.0)
MCV: 86.8 fL (ref 80.0–100.0)
Platelets: 176 10*3/uL (ref 150–400)
RBC: 4.84 MIL/uL (ref 3.87–5.11)
RDW: 13.1 % (ref 11.5–15.5)
WBC: 10.6 10*3/uL — ABNORMAL HIGH (ref 4.0–10.5)
nRBC: 0 % (ref 0.0–0.2)

## 2020-02-24 LAB — LIPASE, BLOOD: Lipase: 24 U/L (ref 11–51)

## 2020-02-24 LAB — TROPONIN I (HIGH SENSITIVITY): Troponin I (High Sensitivity): 6 ng/L (ref <18)

## 2020-02-24 LAB — BRAIN NATRIURETIC PEPTIDE: B Natriuretic Peptide: 23.8 pg/mL (ref 0.0–100.0)

## 2020-02-24 LAB — SARS CORONAVIRUS 2 BY RT PCR (HOSPITAL ORDER, PERFORMED IN ~~LOC~~ HOSPITAL LAB): SARS Coronavirus 2: NEGATIVE

## 2020-02-24 MED ORDER — IPRATROPIUM-ALBUTEROL 0.5-2.5 (3) MG/3ML IN SOLN
3.0000 mL | Freq: Once | RESPIRATORY_TRACT | Status: AC
  Administered 2020-02-24: 3 mL via RESPIRATORY_TRACT
  Filled 2020-02-24: qty 3

## 2020-02-24 MED ORDER — ACETAMINOPHEN 650 MG RE SUPP: 650.0000 mg | Freq: Four times a day (QID) | RECTAL | Status: DC | PRN

## 2020-02-24 MED ORDER — ONDANSETRON HCL 4 MG/2ML IJ SOLN
4.0000 mg | Freq: Four times a day (QID) | INTRAMUSCULAR | Status: DC | PRN
  Administered 2020-02-27: 06:00:00 4 mg via INTRAVENOUS
  Filled 2020-02-24: qty 2

## 2020-02-24 MED ORDER — IPRATROPIUM-ALBUTEROL 0.5-2.5 (3) MG/3ML IN SOLN
3.0000 mL | Freq: Four times a day (QID) | RESPIRATORY_TRACT | Status: DC
  Administered 2020-02-24 – 2020-02-25 (×2): 3 mL via RESPIRATORY_TRACT
  Filled 2020-02-24 (×2): qty 3

## 2020-02-24 MED ORDER — CARVEDILOL 6.25 MG PO TABS
6.2500 mg | ORAL_TABLET | Freq: Two times a day (BID) | ORAL | Status: DC
Start: 2020-02-24 — End: 2020-02-24

## 2020-02-24 MED ORDER — ONDANSETRON HCL 4 MG PO TABS
4.0000 mg | ORAL_TABLET | Freq: Four times a day (QID) | ORAL | Status: DC | PRN
  Administered 2020-02-26: 21:00:00 4 mg via ORAL
  Filled 2020-02-24: qty 1

## 2020-02-24 MED ORDER — OXYCODONE HCL 5 MG PO TABS
5.0000 mg | ORAL_TABLET | ORAL | Status: DC | PRN
  Administered 2020-02-24: 19:00:00 5 mg via ORAL
  Filled 2020-02-24: qty 1

## 2020-02-24 MED ORDER — ONDANSETRON HCL 4 MG/2ML IJ SOLN
4.0000 mg | Freq: Once | INTRAMUSCULAR | Status: AC
  Administered 2020-02-24: 4 mg via INTRAVENOUS
  Filled 2020-02-24: qty 2

## 2020-02-24 MED ORDER — FUROSEMIDE 10 MG/ML IJ SOLN
40.0000 mg | Freq: Two times a day (BID) | INTRAMUSCULAR | Status: DC
  Administered 2020-02-25: 40 mg via INTRAVENOUS
  Filled 2020-02-24: qty 4

## 2020-02-24 MED ORDER — ASPIRIN EC 325 MG PO TBEC
325.0000 mg | DELAYED_RELEASE_TABLET | Freq: Every day | ORAL | Status: DC
  Administered 2020-02-24 – 2020-03-02 (×7): 325 mg via ORAL
  Filled 2020-02-24 (×8): qty 1

## 2020-02-24 MED ORDER — FUROSEMIDE 10 MG/ML IJ SOLN
40.0000 mg | Freq: Once | INTRAMUSCULAR | Status: AC
  Administered 2020-02-24: 40 mg via INTRAVENOUS
  Filled 2020-02-24: qty 4

## 2020-02-24 MED ORDER — SODIUM CHLORIDE 0.9 % IV BOLUS
500.0000 mL | Freq: Once | INTRAVENOUS | Status: AC
  Administered 2020-02-24: 500 mL via INTRAVENOUS

## 2020-02-24 MED ORDER — LISINOPRIL 5 MG PO TABS
2.5000 mg | ORAL_TABLET | Freq: Every day | ORAL | Status: DC
  Administered 2020-02-24: 19:00:00 2.5 mg via ORAL
  Filled 2020-02-24: qty 1

## 2020-02-24 MED ORDER — LEVOFLOXACIN IN D5W 750 MG/150ML IV SOLN
750.0000 mg | INTRAVENOUS | Status: DC
  Administered 2020-02-24: 22:00:00 750 mg via INTRAVENOUS
  Filled 2020-02-24: qty 150

## 2020-02-24 MED ORDER — BUDESONIDE 0.25 MG/2ML IN SUSP
0.2500 mg | Freq: Two times a day (BID) | RESPIRATORY_TRACT | Status: DC
  Administered 2020-02-24 – 2020-03-02 (×13): 0.25 mg via RESPIRATORY_TRACT
  Filled 2020-02-24 (×14): qty 2

## 2020-02-24 MED ORDER — FOLIC ACID 1 MG PO TABS
1.0000 mg | ORAL_TABLET | Freq: Every day | ORAL | Status: DC
  Administered 2020-02-24 – 2020-03-02 (×7): 1 mg via ORAL
  Filled 2020-02-24 (×8): qty 1

## 2020-02-24 MED ORDER — ATORVASTATIN CALCIUM 20 MG PO TABS
40.0000 mg | ORAL_TABLET | Freq: Every day | ORAL | Status: DC
  Administered 2020-02-24 – 2020-03-02 (×7): 40 mg via ORAL
  Filled 2020-02-24 (×8): qty 2

## 2020-02-24 MED ORDER — METHYLPREDNISOLONE SODIUM SUCC 40 MG IJ SOLR
40.0000 mg | Freq: Three times a day (TID) | INTRAMUSCULAR | Status: AC
  Administered 2020-02-24 – 2020-02-25 (×3): 40 mg via INTRAVENOUS
  Filled 2020-02-24 (×3): qty 1

## 2020-02-24 MED ORDER — TRAZODONE HCL 50 MG PO TABS
25.0000 mg | ORAL_TABLET | Freq: Every evening | ORAL | Status: DC | PRN
  Administered 2020-02-26 – 2020-02-28 (×2): 50 mg via ORAL
  Filled 2020-02-24 (×3): qty 1

## 2020-02-24 MED ORDER — IOHEXOL 300 MG/ML  SOLN
100.0000 mL | Freq: Once | INTRAMUSCULAR | Status: AC | PRN
  Administered 2020-02-24: 100 mL via INTRAVENOUS

## 2020-02-24 MED ORDER — SENNOSIDES-DOCUSATE SODIUM 8.6-50 MG PO TABS: 1.0000 | ORAL_TABLET | Freq: Every evening | ORAL | Status: DC | PRN

## 2020-02-24 MED ORDER — HYDRALAZINE HCL 20 MG/ML IJ SOLN
10.0000 mg | INTRAMUSCULAR | Status: DC | PRN
  Administered 2020-02-24 – 2020-02-29 (×5): 10 mg via INTRAVENOUS
  Filled 2020-02-24 (×4): qty 1

## 2020-02-24 MED ORDER — MEGESTROL ACETATE 400 MG/10ML PO SUSP
400.0000 mg | Freq: Two times a day (BID) | ORAL | Status: DC
  Administered 2020-02-24 – 2020-02-29 (×9): 400 mg via ORAL
  Filled 2020-02-24 (×13): qty 10

## 2020-02-24 MED ORDER — PREDNISONE 20 MG PO TABS
40.0000 mg | ORAL_TABLET | Freq: Every day | ORAL | Status: AC
  Administered 2020-02-25 – 2020-02-28 (×4): 40 mg via ORAL
  Filled 2020-02-24 (×4): qty 2

## 2020-02-24 MED ORDER — FENTANYL CITRATE (PF) 100 MCG/2ML IJ SOLN
50.0000 ug | Freq: Once | INTRAMUSCULAR | Status: AC
  Administered 2020-02-24: 50 ug via INTRAVENOUS
  Filled 2020-02-24: qty 2

## 2020-02-24 MED ORDER — CLOPIDOGREL BISULFATE 75 MG PO TABS
75.0000 mg | ORAL_TABLET | Freq: Every day | ORAL | Status: DC
  Administered 2020-02-24 – 2020-03-02 (×7): 75 mg via ORAL
  Filled 2020-02-24 (×8): qty 1

## 2020-02-24 MED ORDER — ACETAMINOPHEN 325 MG PO TABS: 650.0000 mg | ORAL_TABLET | Freq: Four times a day (QID) | ORAL | Status: DC | PRN

## 2020-02-24 MED ORDER — HYDRALAZINE HCL 20 MG/ML IJ SOLN
INTRAMUSCULAR | Status: AC
  Filled 2020-02-24: qty 1

## 2020-02-24 MED ORDER — THIAMINE HCL 100 MG PO TABS
100.0000 mg | ORAL_TABLET | Freq: Every day | ORAL | Status: DC
  Administered 2020-02-24 – 2020-03-02 (×7): 100 mg via ORAL
  Filled 2020-02-24 (×8): qty 1

## 2020-02-24 MED ORDER — ENOXAPARIN SODIUM 40 MG/0.4ML ~~LOC~~ SOLN
40.0000 mg | SUBCUTANEOUS | Status: DC
  Administered 2020-02-24 – 2020-02-29 (×6): 40 mg via SUBCUTANEOUS
  Filled 2020-02-24 (×6): qty 0.4

## 2020-02-24 MED ORDER — ESCITALOPRAM OXALATE 10 MG PO TABS
10.0000 mg | ORAL_TABLET | Freq: Every day | ORAL | Status: DC
  Administered 2020-02-24 – 2020-03-02 (×7): 10 mg via ORAL
  Filled 2020-02-24 (×8): qty 1

## 2020-02-24 MED ORDER — TRAZODONE HCL 50 MG PO TABS
25.0000 mg | ORAL_TABLET | Freq: Every evening | ORAL | Status: DC | PRN
Start: 2020-02-24 — End: 2020-02-24

## 2020-02-24 MED ORDER — DILTIAZEM HCL 30 MG PO TABS
30.0000 mg | ORAL_TABLET | Freq: Four times a day (QID) | ORAL | Status: DC
  Administered 2020-02-24 – 2020-02-29 (×15): 30 mg via ORAL
  Filled 2020-02-24 (×17): qty 1

## 2020-02-24 MED ORDER — ARFORMOTEROL TARTRATE 15 MCG/2ML IN NEBU
15.0000 ug | INHALATION_SOLUTION | Freq: Two times a day (BID) | RESPIRATORY_TRACT | Status: DC
  Administered 2020-02-24 – 2020-03-01 (×11): 15 ug via RESPIRATORY_TRACT
  Filled 2020-02-24 (×15): qty 2

## 2020-02-24 NOTE — Patient Outreach (Signed)
Care Coordination - Case Manager  03/07/2020  Anne [name] December 24, 1960 [id number]  Subjective:  Anne [name] is an 59 y.o. year old female who is a primary patient of Aycock, Ngwe A, MD. She was discharged home from hospital on 02/16/20 to [name] home with her daughter, Anne [name].  Anne [name] moved in with [name] to care for her  Skilled nursing and home health services had not been obtained related to insurance coverage and unavailable covered providers/agencies DME at home includes hospital bed, bedside commode (the daughter purchased out of pocket) and hoyer lift * Daughter states a walker and incontinence supplies are needed in the home   Ms. Powley was given information about Medicaid Managed Care team care coordination services today. Heather Delbuono agreed to services and verbal consent obtained  Review of patient status, laboratory and other test data was performed as part of evaluation for provision of services.  [facility] RN CM noted calls from daughter Anne [name] on 03/13/2020 without voice messages prior to [facility] office hours [facility] RN CM received the following referral on 02/22/2020 from [place] from nurse call center: Caller says that her mom has not had a bowel movement in three days.  She has a small knot in the RLQ.  Patient is unable to stand to go to the bedside commode.  Daughter says that the patient is in a lot of pain.  No fever. Daughter says that her mom is supposed to be on home health.  Patient is not able to travel via car.  She is a recent stroke patient.  Protocol Used: Constipation (Adult) Protocol-Based Disposition: See PCP within 3 Days Positive Triage Question:* Unable to have a bowel movement (BM) without laxative or enema * All higher-acuity triage questions were negative Care Advice Discussed:* Liquids* Reasons To Call Back    - No BM for over 4 days    - Constant or increasing abdominal pain    - Abdominal swelling, vomiting or fever occur    - You become  worse. Esaw Grandchild, RN  [facility] RN CM outreached to [place]. Anne [name] is able to verify HIPAA but tearful. Anne [name] reports that Ms Born was sent to ED this am via EMS as had been discussed by [facility] RN CM on 02/23/20 and Nurse call center RN 02/19/2020. Anne [name] voiced "I want my mother back. We were best friends." She states [name] had a stool "right before" EMS arrived but "was in so much pain"  [name] provided support and offered words of comfort as [name], the possibilities of [name] "not getting any better" and her unsuccessfully attempts to encourage [name] to be more active and motivated since her discharge back to [name] home.  Anne [name] reports completing the passive range of motion exercises she and [facility] RN CM discussed on 02/19/20 to prevent contractures. Anne [name] discussed [name] had a noted loss of interest to be active and only remain in her bed.  Anne [name] stated she noted [name] followed the EMS staff direction and was noted to be able to be more mobile without complaints than she had been since her discharge home. "She did what the EMS people told her" Anne [name] discussed that at the end of [name] inpatient rehab stay she had also a period of time that she had stopped participating in therapies. Anne [name] states during the last week she discussed this with [name]  and was informed by [name] she stopped participating in therapies "because you stopped coming to see me."   Anne [name] and [name] discussed [name] history of recurrent, moderate, major depressive disorder related to her behavior at home, wanting Anne [name] to provide lots of care and lack of motivation. [facility] RN CM discussed the [facility] SW referral  Anne [name] reports she works a full time job and has 2 young boys and had to miss visiting for a few days. Anne [name] is the youngest daughter of 2 but the oldest daughter  was "killed" in 2004. Anne [name] is the primary caregiver with occasional help/visits from her deceased sister's father. Anne [name] reports she would like to have Ms Knee return home but questions her ability to provide the care needed by herself without home care support and [name] being motivated to get better  Anne [name] discussed [name] also was beginning to have an odor of her left hand.   Anne [name] is presently at work and voices interest in getting outreaches from hospital staff on the progression of the patient care.  THN RN CM outreached to [facility] regional ED staff [phone] Spoke with Holley Raring to leave a message for the attending nurse to outreach to [place] about the patient's progress  SDOH: SDOH Screenings   Alcohol Screen:   . Last Alcohol Screening Score (AUDIT):   Depression (PHQ2-9): Medium Risk  . PHQ-2 Score: 10  Financial Resource Strain: Low Risk   . Difficulty of Paying Living Expenses: Not very hard  Food Insecurity: No Food Insecurity  . Worried About Charity fundraiser in the Last Year: Never true  . Ran Out of Food in the Last Year: Never true  Housing: Low Risk   . Last Housing Risk Score: 0  Physical Activity: Inactive  . Days of Exercise per Week: 0 days  . Minutes of Exercise per Session: 0 min  Social Connections: Moderately Integrated  . Frequency of Communication with Friends and Family: Twice a week  . Frequency of Social Gatherings with Friends and Family: Twice a week  . Attends Religious Services: 1 to 4 times per year  . Active Member of Clubs or Organizations: No  . Attends Archivist Meetings: 1 to 4 times per year  . Marital Status: Never married  Stress: No Stress Concern Present  . Feeling of Stress : Only a little  Tobacco Use: High Risk  . Smoking Tobacco Use: Current Every Day Smoker  . Smokeless Tobacco Use: Never Used  Transportation Needs: Unmet Transportation Needs  . Lack of Transportation (Medical): Yes  .  Lack of Transportation (Non-Medical): Yes     Objective:    No Known Allergies  Medications:    Medications Reviewed Today    Reviewed by Payton Doughty, CPhT (Pharmacy Technician) on 01/08/20 at 1730  Med List Status: Home Meds Unknown  Medication Order Taking? Sig Documenting Provider Last Dose Status Informant  albuterol (PROVENTIL HFA;VENTOLIN HFA) 108 (90 BASE) MCG/ACT inhaler [id number]  Inhale 2 puffs into the lungs every 6 (six) hours as needed for wheezing or shortness of breath. Gladstone Lighter, MD  Active Child           Med Note Orvan Seen, Sharlette Dense   Fri Jan 01, 2020  7:37 PM) found at pt's house   albuterol (PROVENTIL) (2.5 MG/3ML) 0.083% nebulizer solution [id number]  Take 3 mLs (2.5 mg total) by nebulization every 6 (six) hours as needed for wheezing or shortness of breath.  Patient not taking: Reported on 01/01/2020   Gladstone Lighter, MD  Active   amLODipine (NORVASC) 5 MG tablet [id number]  Take 1 tablet (5 mg total) by mouth daily. Carrie Mew, MD  Active   aspirin EC 81 MG EC tablet [id number]  Take 1 tablet (81 mg total) by mouth daily.  Patient not taking: Reported on 11/03/2016   Gladstone Lighter, MD  Active   cetirizine (ZYRTEC) 10 MG tablet [id number]  Take 10 mg by mouth daily as needed for allergies.  [provider]  Active   diltiazem (CARDIZEM CD) 120 MG 24 hr capsule [id number]  Take 1 capsule (120 mg total) by mouth daily.  Patient not taking: Reported on 11/03/2016   Gladstone Lighter, MD  Active   diltiazem (CARDIZEM CD) 180 MG 24 hr capsule [id number]  Take 180 mg by mouth daily. [provider]  Active Child           Med Note Orvan Seen, Sharlette Dense   Fri Jan 01, 2020  7:37 PM) Bottle found at pt's house   esomeprazole (NEXIUM) 20 MG capsule [id number]  Take 20 mg by mouth daily at 12 noon. [provider]  Active   famotidine (PEPCID) 20 MG tablet [id number]  Take 1 tablet (20 mg total) by mouth 2 (two) times daily.  Carrie Mew, MD  Active   Fluticasone-Salmeterol (ADVAIR) 250-50 MCG/DOSE Graylin Shiver [id number]  Inhale 1 puff into the lungs 2 (two) times daily.  Patient not taking: Reported on 11/03/2016   Gladstone Lighter, MD  Active   isosorbide mononitrate (IMDUR) 30 MG 24 hr tablet [id number]  Take 1 tablet (30 mg total) by mouth daily. Carrie Mew, MD  Active   lisinopril (PRINIVIL,ZESTRIL) 10 MG tablet [id number]  Take 10 mg by mouth daily. [provider]  Active   metoCLOPramide (REGLAN) 10 MG tablet [id number]  Take 1 tablet (10 mg total) by mouth every 6 (six) hours as needed. Carrie Mew, MD  Active   nitroGLYCERIN (NITROSTAT) 0.4 MG SL tablet [id number]  Place 1 tablet (0.4 mg total) under the tongue every 5 (five) minutes as needed for chest pain. Gladstone Lighter, MD  Active Child           Med Note Orvan Seen, Sharlette Dense   Fri Jan 01, 2020  7:37 PM) Bottle found at pt's house   ondansetron (ZOFRAN ODT) 4 MG disintegrating tablet [id number]  Take 1 tablet (4 mg total) by mouth every 8 (eight) hours as needed for nausea or vomiting. Carrie Mew, MD  Active   traZODone (DESYREL) 100 MG tablet [id number]  Take 100 mg by mouth at bedtime. [provider]  Active Child           Med Note Orvan Seen, Sharlette Dense   Fri Jan 01, 2020  7:37 PM) Bottle found at pt's house   zolpidem (AMBIEN) 10 MG tablet [id number]  Take 10 mg by mouth at bedtime. [provider]  Active Child           Med Note Payton Doughty   Fri Jan 01, 2020  7:38 PM) #30 filled 11/30/2019 [place] per PMP AWARE - Bottle found at pt's house             Goals Addressed   None   03/14/2020 1154 [facility]N RN CM left a message for Christian in the referral area of [facility] inquiring about the status of referral to [facility] left [facility] RN  CM office and mobile numbers [place] CM received a message stating Darrick Meigs will send the referral information to the fax  number provided on 02/22/20 in [place] message left for him   Plan:  [facility] RN CM follow up with Ms Koren/Anne [name] after discharge within the next 1-3 business days Pt encouraged to return a call to [facility] RN CM prn Routed note to MD  [place]. [name], RN, BSN, [place] Coordinator Office number [phone] Mobile number [phone]  Main THN number [phone] Fax number [phone]

## 2020-02-24 NOTE — ED Triage Notes (Signed)
Pt here for RLQ pain.  Bed bound at home from previous stroke. Started with pain this AM.  Arrived EMS.  Denies NVD, fever.  Oriented but slow to answer; from stroke.

## 2020-02-24 NOTE — ED Triage Notes (Signed)
First nurse note- no use left side at baseline from CVA.  RLQ pain.  From home, lives with daughter.  Arrived EMS, VSS per EMS. Able to communicate per EMS

## 2020-02-24 NOTE — Patient Outreach (Signed)
Care Coordination  02/15/2020  Anne [name] [birth year] [id number]   [place] coordination- assistance to daughter  Anne [name] is able to verify HIPAA ([place] and Accountability Act) identifiers, date of birth (DOB) and address   Consent: [facility] ([place]) RN CM reviewed [facility] services with patient. Patient gave verbal consent for services.  Assisted daughter, Anne [name] to get an update on patient as she has not been contacted  Completed a conference Outreach with Anne [name] to [phone] and transferred to patient's nurse Re admitted Daughter able to speak with an [place] regional nurse on 1C to inquire about her mother and visitation Daughter requested nursing staff to outreach to her and verified her mobile number  [name]'s questions answered about advance directive. Encouraged her to request assist of a CM/SW during a visit with [name] to get forms completed  Anne [name] voiced her appreciation  [facility] RN CM follow up with [name]/[name] after discharge  Anne [name] encouraged to return a call to [place] CM prn Updated [facility] hospital liaison of re admission   [name]. [name], RN, BSN, [place] Coordinator Office number [phone] Mobile number [phone]  Main THN number [phone] Fax number [phone]

## 2020-02-24 NOTE — ED Provider Notes (Signed)
[facility] Emergency Department Provider Note  ____________________________________________   First MD Initiated Contact with Patient 03/09/2020 1303     (approximate)  I have reviewed the triage vital signs and the nursing notes.   HISTORY  Chief Complaint Abdominal Pain    HPI Anne [name] is a 59 y.o. female  With PMhx polysubstance abuse, CAD, CVA here with weakness, abd pain, SOB. History is somewhat limited 2/2 CVA. However, she reports that she is here because she has felt weak with right-sided abd pain, cough, and sputum production. She has fet more SOB throughout the day. No known sick contacts. No known fevers. Denies any nausea, vomiting, diarrhea. Unable to fully elaborate/describe pain.  Level 5 caveat invoked as remainder of history, ROS, and physical exam limited due to patient's CVA.        Past Medical History:  Diagnosis Date  . Asthma   . CAD (coronary artery disease)    a. cardiac cath 02/04/2013: tubular 20% stenosis pRCA, tubular 30% stenosis mRCA, medically managed  . Coronary vasospasm (HCC)    a. cardiac cath 11/25/2014: mLCx 30%, pRCA 30%, moderate spasm w/ noted improvement w/ reimaging, recommended CCB, long acting nitrate, smoking cessation, & statin   . Hypertension   . Polysubstance abuse ([place])    a. remote history of crack/cocaine abuse approximately 8-10 years ago, ongoing tobacco abuse since age 76, rare etoh abuse  . Stroke ([place])   . Tubular adenoma     Patient Active Problem List   Diagnosis Date Noted  . Acute hypoxemic respiratory failure ([place]) 03/10/2020  . Hemiparesis affecting left side as late effect of stroke ([place])   . Elevated BUN   . Sleep disturbance   . Left foot pain   . Acute right MCA stroke ([place]) 01/08/2020  . Acute lower UTI   . Left hemiplegia ([place])   . Acute ischemic stroke ([place])   . Dysphagia, post-stroke   . Hyperglycemia   . Acute respiratory failure with hypoxia ([place])   . Stroke  (cerebrum) ([place]) 12/31/2019  . Polysubstance abuse ([place]) 07/18/2018  . Coronary artery disease of native artery of native heart with stable angina pectoris ([place]) 04/15/2018  . Other nonspecific abnormal finding of lung field 04/15/2018  . Insomnia, unspecified 11/14/2016  . CAD (coronary artery disease)   . NSTEMI (non-ST elevated myocardial infarction) ([place]) 11/25/2014  . Chest pain 11/24/2014  . Major depressive disorder, recurrent episode, moderate degree ([place]) 11/10/2014  . Family history of colon cancer 12/17/2013  . Adenomatous polyp 12/17/2013  . Hypertension 10/21/2013  . Other disorders of lung 09/22/2013  . Chronic obstructive pulmonary disease with (acute) exacerbation ([place]) 07/07/2013  . Allergic conjunctivitis 03/13/2012  . Acid reflux disease 12/11/2007  . Asthma 09/24/2007  . Cigarette smoker 09/24/2007    Past Surgical History:  Procedure Laterality Date  . CARDIAC CATHETERIZATION N/A 11/25/2014   Procedure: Left Heart Cath and Coronary Angiography;  Surgeon: Minna Merritts, MD;  Location: [facility];  Service: Cardiovascular;  Laterality: N/A;  . IR CT HEAD LTD  12/31/2019  . IR PERCUTANEOUS ART THROMBECTOMY/INFUSION INTRACRANIAL INC DIAG ANGIO  12/31/2019      . IR PERCUTANEOUS ART THROMBECTOMY/INFUSION INTRACRANIAL INC DIAG ANGIO  12/31/2019  . RADIOLOGY WITH ANESTHESIA Left 12/31/2019   Procedure: RADIOLOGY WITH ANESTHESIA;  Surgeon: Radiologist, Medication, MD;  Location: [facility];  Service: Radiology;  Laterality: Left;    Prior to Admission medications   Medication Sig Start Date End  Date Taking? Authorizing Provider  acetaminophen (TYLENOL) 325 MG tablet Take 2 tablets (650 mg total) by mouth every 6 (six) hours. 02/16/20   Love, Ivan Anchors, PA-C  aspirin 325 MG tablet Take 1 tablet (325 mg total) by mouth daily. 02/17/20   Love, Ivan Anchors, PA-C  atorvastatin (LIPITOR) 40 MG tablet Take 1 tablet (40 mg total) by mouth daily. 02/17/20   Love, Ivan Anchors, PA-C    cetirizine (ZYRTEC) 10 MG tablet Take 10 mg by mouth daily as needed for allergies.     [provider]  clopidogrel (PLAVIX) 75 MG tablet Take 1 tablet (75 mg total) by mouth daily. 02/17/20   Love, Ivan Anchors, PA-C  diltiazem (CARDIZEM) 30 MG tablet Take 1 tablet (30 mg total) by mouth every 6 (six) hours. 02/16/20   Love, Ivan Anchors, PA-C  escitalopram (LEXAPRO) 10 MG tablet Take 1 tablet (10 mg total) by mouth daily. 02/17/20   Love, Ivan Anchors, PA-C  Fluticasone-Salmeterol (ADVAIR) 250-50 MCG/DOSE AEPB Inhale 1 puff into the lungs 2 (two) times daily. Patient not taking: Reported on 11/03/2016 11/25/14   Gladstone Lighter, MD  folic acid (FOLVITE) 1 MG tablet Take 1 tablet (1 mg total) by mouth daily. 02/17/20   Love, Ivan Anchors, PA-C  lidocaine (LIDODERM) 5 % Place 1 patch onto the skin daily. Apply at 8 am and remove at 8 pm daily. Purchase this over the counter 02/16/20   Love, Ivan Anchors, PA-C  lisinopril (ZESTRIL) 2.5 MG tablet Take 1 tablet (2.5 mg total) by mouth daily. 02/17/20   Love, Ivan Anchors, PA-C  megestrol (MEGACE) 400 MG/10ML suspension Take 10 mLs (400 mg total) by mouth 2 (two) times daily. 02/16/20   Love, Ivan Anchors, PA-C  Menthol-Methyl Salicylate (MUSCLE RUB) 10-15 % CREA Apply 1 application topically 2 (two) times daily as needed for muscle pain. To right thigh 02/16/20   Love, Ivan Anchors, PA-C  Multiple Vitamin (MULTIVITAMIN WITH MINERALS) TABS tablet Take 1 tablet by mouth daily. 02/17/20   Love, Ivan Anchors, PA-C  thiamine 100 MG tablet Take 1 tablet (100 mg total) by mouth daily. 02/17/20   Love, Ivan Anchors, PA-C  traZODone (DESYREL) 50 MG tablet Take 0.5-1 tablets (25-50 mg total) by mouth at bedtime as needed for sleep. 02/16/20   Bary Leriche, PA-C    Allergies Patient has no known allergies.  Family History  Problem Relation Age of Onset  . Breast cancer Mother 36  . CAD Father 25    Social History Social History   Tobacco Use  . Smoking status: Current Every Day Smoker     Packs/day: 1.00    Years: 45.00    Pack years: 45.00  . Smokeless tobacco: Never Used  Substance Use Topics  . Alcohol use: Yes  . Drug use: No    Review of Systems  Review of Systems  Constitutional: Positive for fatigue. Negative for fever.  HENT: Negative for congestion and sore throat.   Eyes: Negative for visual disturbance.  Respiratory: Positive for cough and shortness of breath.   Cardiovascular: Negative for chest pain.  Gastrointestinal: Positive for abdominal pain and nausea. Negative for diarrhea and vomiting.  Genitourinary: Negative for flank pain.  Musculoskeletal: Negative for back pain and neck pain.  Skin: Negative for rash and wound.  Neurological: Negative for weakness.  All other systems reviewed and are negative.    ____________________________________________  PHYSICAL EXAM:      VITAL SIGNS: ED Triage Vitals  Enc Vitals  Group     BP 02/14/2020 0902 (!) 152/78     Pulse Rate 02/19/2020 0902 83     Resp 02/23/2020 0902 18     Temp 03/13/2020 0902 98 F (36.7 C)     Temp Source 03/12/2020 1141 Oral     SpO2 03/01/2020 0902 95 %     Weight 03/15/2020 0846 150 lb (68 kg)     Height --      Head Circumference --      Peak Flow --      Pain Score --      Pain Loc --      Pain Edu? --      Excl. in [place]? --      Physical Exam Vitals and nursing note reviewed.  Constitutional:      General: She is not in acute distress.    Appearance: She is well-developed.  HENT:     Head: Normocephalic and atraumatic.  Eyes:     Conjunctiva/sclera: Conjunctivae normal.  Cardiovascular:     Rate and Rhythm: Normal rate and regular rhythm.     Heart sounds: Normal heart sounds. No murmur heard.  No friction rub.  Pulmonary:     Effort: Tachypnea and respiratory distress present.     Breath sounds: Decreased breath sounds and rales present. No wheezing.  Abdominal:     General: There is no distension.     Palpations: Abdomen is soft.     Tenderness: There is no  abdominal tenderness.  Musculoskeletal:     Cervical back: Neck supple.  Skin:    General: Skin is warm.     Capillary Refill: Capillary refill takes less than 2 seconds.  Neurological:     Mental Status: She is alert and oriented to person, place, and time.     Motor: No abnormal muscle tone.       ____________________________________________   LABS (all labs ordered are listed, but only abnormal results are displayed)  Labs Reviewed  CBC - Abnormal; Notable for the following components:      Result Value   WBC 10.6 (*)    All other components within normal limits  SARS CORONAVIRUS 2 BY RT PCR (HOSPITAL ORDER, [facility])  LIPASE, BLOOD  COMPREHENSIVE METABOLIC PANEL  BRAIN NATRIURETIC PEPTIDE  URINALYSIS, COMPLETE (UACMP) WITH MICROSCOPIC  HIV ANTIBODY (ROUTINE TESTING W REFLEX)  CBC  BASIC METABOLIC PANEL  TROPONIN I (HIGH SENSITIVITY)    ____________________________________________  EKG: Sinus tachycardia, VR 108. QRS 120, QTc 604. No acute St elevations or depressions.  ________________________________________  RADIOLOGY All imaging, including plain films, CT scans, and ultrasounds, independently reviewed by me, and interpretations confirmed via formal radiology reads.  ED MD interpretation:   CXR: Interstitial opacities diffusely, edema vs atypical PNA CT A/P: Neg for intra-abd pathology, bibasilar interstitial opacities noted  Official radiology report(s): CT ABDOMEN PELVIS W CONTRAST  Result Date: 02/29/2020 CLINICAL DATA:  General abdominal pain. EXAM: CT ABDOMEN AND PELVIS WITH CONTRAST TECHNIQUE: Multidetector CT imaging of the abdomen and pelvis was performed using the standard protocol following bolus administration of intravenous contrast. CONTRAST:  19mL OMNIPAQUE IOHEXOL 300 MG/ML  SOLN COMPARISON:  CT chest 05/22/2019 FINDINGS: Lower chest: Bibasilar interstitial opacities and trace effusions. Probable right greater than  left atelectasis. Hepatobiliary: No focal liver abnormality is seen. No gallstones, gallbladder wall thickening, or biliary dilatation. Pancreas: Unremarkable. No pancreatic ductal dilatation or surrounding inflammatory changes. Spleen: Normal in size without focal abnormality.  Adrenals/Urinary Tract: Stable left 3.2 cm adrenal gland nodule. Small hypodense nodule in the inferior right kidney measuring 0.8 cm, too small to fully characterize. No hydronephrosis. No renal calculi. Urinary bladder unremarkable. Stomach/Bowel: Stomach is within normal limits. Appendix appears normal. No evidence of bowel wall thickening, distention, or inflammatory changes. Colonic diverticula without evidence of diverticulitis. Vascular/Lymphatic: Aortic atherosclerosis. No enlarged abdominal or pelvic lymph nodes. Reproductive: Uterus and bilateral adnexa are unremarkable. Other: No abdominal wall abnormality.  No abdominopelvic ascites. Musculoskeletal: Chronic severe wedge compression deformity at L1. IMPRESSION: 1. No acute intra-abdominal or intrapelvic process. 2. Bibasilar interstitial opacities and trace effusions, possibly reflecting pulmonary edema, infection not excluded. 3. Colonic diverticulosis without evidence of diverticulitis. 4. Aortic atherosclerosis. Aortic Atherosclerosis (ICD10-I70.0). Electronically Signed   By: Audie Pinto M.D.   On: 03/09/2020 14:51   DG Chest Portable 1 View  Result Date: 02/29/2020 CLINICAL DATA:  Shortness of breath EXAM: PORTABLE CHEST 1 VIEW COMPARISON:  01/11/2020 FINDINGS: Interstitial prominence. Probable mild atelectasis at the lung bases. No pleural effusion. No pneumothorax. Normal heart size. IMPRESSION: Interstitial prominence is likely chronic with possible superimposed mild edema. Electronically Signed   By: Macy Mis M.D.   On: 03/11/2020 14:04    ____________________________________________  PROCEDURES   Procedure(s) performed (including Critical  Care):  .1-3 Lead EKG Interpretation Performed by: Duffy Bruce, MD Authorized by: Duffy Bruce, MD     Interpretation: abnormal     ECG rate:  90-110   Rhythm: sinus tachycardia     Ectopy: PVCs     Conduction: normal   Comments:     Indication: resp distress    ____________________________________________  INITIAL IMPRESSION / MDM / ASSESSMENT AND PLAN / ED COURSE  As part of my medical decision making, I reviewed the following data within the [facility] notes reviewed and incorporated, Old chart reviewed, Notes from prior ED visits, and [place] Controlled Substance Database       *Sailor Mottola was evaluated in Emergency Department on 02/14/2020 for the symptoms described in the history of present illness. She was evaluated in the context of the global COVID-19 pandemic, which necessitated consideration that the patient might be at risk for infection with the SARS-CoV-2 virus that causes COVID-19. Institutional protocols and algorithms that pertain to the evaluation of patients at risk for COVID-19 are in a state of rapid change based on information released by regulatory bodies including the CDC and federal and state organizations. These policies and algorithms were followed during the patient's care in the ED.  Some ED evaluations and interventions may be delayed as a result of limited staffing during the pandemic.*     Medical Decision Making:  59 yo F here with abd pain, weakness. History somewhat limited 2/2 CVA, mild confusion. However, suspect sx could be multifactorial 2/2 CHF with abd distension, COPD, or both. No fever or signs to suggest PNA though this is also a consideration. Pt notably hypoxic with b/l rhonchi on arrival, improved on 2L [place]. COVID neg. Labs show minimal leukocytosis, CMP at baseline. Lipase normal. Remainder of labs unremarkable. EKG is nonischemic and she denies chest pain. No asymmetric LE edema or signs of DVT  clinically.  Will admit for hypoxic resp failure, likely 2/2 CHF versus COPD. Lasix, duonebs given.  ____________________________________________  FINAL CLINICAL IMPRESSION(S) / ED DIAGNOSES  Final diagnoses:  COPD exacerbation ([place])  Acute congestive heart failure, unspecified heart failure type ([place])     MEDICATIONS GIVEN DURING THIS VISIT:  Medications  aspirin tablet 325 mg (has no administration in time range)  megestrol (MEGACE) 400 MG/10ML suspension 400 mg (has no administration in time range)  atorvastatin (LIPITOR) tablet 40 mg (has no administration in time range)  diltiazem (CARDIZEM) tablet 30 mg (has no administration in time range)  lisinopril (ZESTRIL) tablet 2.5 mg (has no administration in time range)  escitalopram (LEXAPRO) tablet 10 mg (has no administration in time range)  traZODone (DESYREL) tablet 25-50 mg (has no administration in time range)  clopidogrel (PLAVIX) tablet 75 mg (has no administration in time range)  folic acid (FOLVITE) tablet 1 mg (has no administration in time range)  thiamine tablet 100 mg (has no administration in time range)  enoxaparin (LOVENOX) injection 40 mg (has no administration in time range)  acetaminophen (TYLENOL) tablet 650 mg (has no administration in time range)    Or  acetaminophen (TYLENOL) suppository 650 mg (has no administration in time range)  oxyCODONE (Oxy IR/ROXICODONE) immediate release tablet 5 mg (has no administration in time range)  traZODone (DESYREL) tablet 25 mg (has no administration in time range)  senna-docusate (Senokot-S) tablet 1 tablet (has no administration in time range)  ondansetron (ZOFRAN) tablet 4 mg (has no administration in time range)    Or  ondansetron (ZOFRAN) injection 4 mg (has no administration in time range)  hydrALAZINE (APRESOLINE) injection 10 mg (10 mg Intravenous Given 03/08/2020 1737)  furosemide (LASIX) injection 40 mg (has no administration in time range)  methylPREDNISolone  sodium succinate (SOLU-MEDROL) 40 mg/mL injection 40 mg (has no administration in time range)    Followed by  predniSONE (DELTASONE) tablet 40 mg (has no administration in time range)  ipratropium-albuterol (DUONEB) 0.5-2.5 (3) MG/3ML nebulizer solution 3 mL (has no administration in time range)  arformoterol (BROVANA) nebulizer solution 15 mcg (has no administration in time range)  budesonide (PULMICORT) nebulizer solution 0.25 mg (has no administration in time range)  levofloxacin (LEVAQUIN) IVPB 750 mg (has no administration in time range)  hydrALAZINE (APRESOLINE) 20 MG/ML injection (has no administration in time range)  sodium chloride 0.9 % bolus 500 mL (0 mLs Intravenous Stopped 02/23/2020 1639)  fentaNYL (SUBLIMAZE) injection 50 mcg (50 mcg Intravenous Given 03/07/2020 1400)  ondansetron (ZOFRAN) injection 4 mg (4 mg Intravenous Given 02/18/2020 1400)  iohexol (OMNIPAQUE) 300 MG/ML solution 100 mL (100 mLs Intravenous Contrast Given 03/03/2020 1413)  ipratropium-albuterol (DUONEB) 0.5-2.5 (3) MG/3ML nebulizer solution 3 mL (3 mLs Nebulization Given 02/21/2020 1610)  furosemide (LASIX) injection 40 mg (40 mg Intravenous Given 03/15/2020 1608)     ED Discharge Orders    None       Note:  This document was prepared using Dragon voice recognition software and may include unintentional dictation errors.   Duffy Bruce, MD 02/16/2020 1825

## 2020-02-24 NOTE — Telephone Encounter (Signed)
Sw exploring HH options for pt since d/c.   SW spoke with Tiffany/[facility] and reports will review pt referral again. States it is pending on pt care needs since documentation shows she was admitted to hospital today.   Declined:  [facility]- no staffing in area [facility]-does not have PT or SN staff; call back next Monday

## 2020-02-24 NOTE — Progress Notes (Signed)
Dr Priscella Mann made aware that pts BP and HR trending up, per MD give PRNs or scheduled BP, HR meds that are due

## 2020-02-24 NOTE — H&P (Addendum)
History and Physical    Anne [name] GYB:[id number] DOB: 19-Sep-1960 DOA: 03/03/2020  PCP: Donnie Coffin, MD  Patient coming from: Long-term care facility  I have personally briefly reviewed patient's old medical records in [place]  Chief Complaint: Abdominal pain, weakness  HPI: Anne [name] is a 59 y.o. female with medical history significant of CVA, hypertension, coronary artery disease presents the emergency department for evaluation of abdominal pain associated with weakness and cough.  The patient is post CVA thus little difficult to understand this majority the history is gained by speaking with the ED physician in reading the associated documentation.  Patient reports she is here because she felt weak and more short of breath throughout the day.  No other obvious infectious contacts.  No known sick contacts.  No history of fevers.  No other associated symptoms.  Unable to fully characterize pain.  On my evaluation patient is resting in bed.  She appears chronically ill.  Is difficult to communicate with her secondary to CVA.  However she does understand and nods head yes or no to interview questions.  For the abdominal pain etiology is unclear.  She had a CT abdomen done in the emergency department that did not reveal any acute etiology.  Chest imaging was significant for interstitial edema.  Working diagnosis is acute hypoxic respiratory failure secondary to concomitant COPD and CHF.  ED Course: Patient was given a dose of Lasix and a breathing treatment.  Pain is treated with fentanyl.  Hospitalist contacted for admission.  Review of Systems: As per HPI otherwise 14 point review of systems negative.    Past Medical History:  Diagnosis Date  . Asthma   . CAD (coronary artery disease)    a. cardiac cath 02/04/2013: tubular 20% stenosis pRCA, tubular 30% stenosis mRCA, medically managed  . Coronary vasospasm (HCC)    a. cardiac cath 11/25/2014: mLCx 30%, pRCA 30%, moderate  spasm w/ noted improvement w/ reimaging, recommended CCB, long acting nitrate, smoking cessation, & statin   . Hypertension   . Polysubstance abuse ([place])    a. remote history of crack/cocaine abuse approximately 8-10 years ago, ongoing tobacco abuse since age 89, rare etoh abuse  . Stroke ([place])   . Tubular adenoma     Past Surgical History:  Procedure Laterality Date  . CARDIAC CATHETERIZATION N/A 11/25/2014   Procedure: Left Heart Cath and Coronary Angiography;  Surgeon: Minna Merritts, MD;  Location: [facility];  Service: Cardiovascular;  Laterality: N/A;  . IR CT HEAD LTD  12/31/2019  . IR PERCUTANEOUS ART THROMBECTOMY/INFUSION INTRACRANIAL INC DIAG ANGIO  12/31/2019      . IR PERCUTANEOUS ART THROMBECTOMY/INFUSION INTRACRANIAL INC DIAG ANGIO  12/31/2019  . RADIOLOGY WITH ANESTHESIA Left 12/31/2019   Procedure: RADIOLOGY WITH ANESTHESIA;  Surgeon: Radiologist, Medication, MD;  Location: [place];  Service: Radiology;  Laterality: Left;     reports that she has been smoking. She has a 45.00 pack-year smoking history. She has never used smokeless tobacco. She reports current alcohol use. She reports that she does not use drugs.  No Known Allergies  Family History  Problem Relation Age of Onset  . Breast cancer Mother 67  . CAD Father 48  No family history of congestive heart failure COPD  Prior to Admission medications   Medication Sig Start Date End Date Taking? Authorizing Provider  acetaminophen (TYLENOL) 325 MG tablet Take 2 tablets (650 mg total) by mouth every 6 (six) hours. 02/16/20  Love, Ivan Anchors, PA-C  aspirin 325 MG tablet Take 1 tablet (325 mg total) by mouth daily. 02/17/20   Love, Ivan Anchors, PA-C  atorvastatin (LIPITOR) 40 MG tablet Take 1 tablet (40 mg total) by mouth daily. 02/17/20   Love, Ivan Anchors, PA-C  cetirizine (ZYRTEC) 10 MG tablet Take 10 mg by mouth daily as needed for allergies.     [provider]  clopidogrel (PLAVIX) 75 MG tablet Take 1 tablet  (75 mg total) by mouth daily. 02/17/20   Love, Ivan Anchors, PA-C  diltiazem (CARDIZEM) 30 MG tablet Take 1 tablet (30 mg total) by mouth every 6 (six) hours. 02/16/20   Love, Ivan Anchors, PA-C  escitalopram (LEXAPRO) 10 MG tablet Take 1 tablet (10 mg total) by mouth daily. 02/17/20   Love, Ivan Anchors, PA-C  Fluticasone-Salmeterol (ADVAIR) 250-50 MCG/DOSE AEPB Inhale 1 puff into the lungs 2 (two) times daily. Patient not taking: Reported on 11/03/2016 11/25/14   Gladstone Lighter, MD  folic acid (FOLVITE) 1 MG tablet Take 1 tablet (1 mg total) by mouth daily. 02/17/20   Love, Ivan Anchors, PA-C  lidocaine (LIDODERM) 5 % Place 1 patch onto the skin daily. Apply at 8 am and remove at 8 pm daily. Purchase this over the counter 02/16/20   Love, Ivan Anchors, PA-C  lisinopril (ZESTRIL) 2.5 MG tablet Take 1 tablet (2.5 mg total) by mouth daily. 02/17/20   Love, Ivan Anchors, PA-C  megestrol (MEGACE) 400 MG/10ML suspension Take 10 mLs (400 mg total) by mouth 2 (two) times daily. 02/16/20   Love, Ivan Anchors, PA-C  Menthol-Methyl Salicylate (MUSCLE RUB) 10-15 % CREA Apply 1 application topically 2 (two) times daily as needed for muscle pain. To right thigh 02/16/20   Love, Ivan Anchors, PA-C  Multiple Vitamin (MULTIVITAMIN WITH MINERALS) TABS tablet Take 1 tablet by mouth daily. 02/17/20   Love, Ivan Anchors, PA-C  thiamine 100 MG tablet Take 1 tablet (100 mg total) by mouth daily. 02/17/20   Love, Ivan Anchors, PA-C  traZODone (DESYREL) 50 MG tablet Take 0.5-1 tablets (25-50 mg total) by mouth at bedtime as needed for sleep. 02/16/20   Bary Leriche, PA-C    Physical Exam: Vitals:   02/15/2020 1141 02/25/2020 1530 03/07/2020 1600 02/22/2020 1630  BP: (!) 143/93 (!) 166/76 (!) 164/97 (!) 184/92  Pulse: 96 85 (!) 102 (!) 117  Resp: 18   (!) 25  Temp: 97.9 F (36.6 C)     TempSrc: Oral     SpO2: 94% 97% 96% 93%  Weight:         Vitals:   03/14/2020 1141 03/03/2020 1530 02/27/2020 1600 02/29/2020 1630  BP: (!) 143/93 (!) 166/76 (!) 164/97 (!) 184/92  Pulse: 96 85 (!)  102 (!) 117  Resp: 18   (!) 25  Temp: 97.9 F (36.6 C)     TempSrc: Oral     SpO2: 94% 97% 96% 93%  Weight:      Constitutional: No acute distress.  Appears chronically ill Eyes: PERRL, lids and conjunctivae normal ENMT: Mucous membranes are dry. Posterior pharynx clear of any exudate or lesions.poor dentition.  Neck: normal, supple, no masses, no thyromegaly Respiratory: No accessory muscle use.  Scattered crackles.  Upper airway wheeze.  Normal work of breathing.  4 L nasal cannula Cardiovascular: Tachycardic, regular rhythm, no murmurs, no pedal edema.  Abdomen: Positive bowel sounds, nontender, nondistended Musculoskeletal: Decreased muscle tone diffusely, no obvious contractures, Skin: Scattered excoriations.  No induration Neurologic: Cranial nerves  grossly intact, sensation intact, strength not assessed due to post CVA status psychiatric: Unable to fully assess.  Patient expressed understanding.  Unable to assess orientation  Labs on Admission: I have personally reviewed following labs and imaging studies  CBC: Recent Labs  Lab 03/01/2020 0827  WBC 10.6*  HGB 13.4  HCT 42.0  MCV 86.8  PLT 888   Basic Metabolic Panel: Recent Labs  Lab 02/29/2020 0827  NA 137  K 4.2  CL 106  CO2 22  GLUCOSE 88  BUN 17  CREATININE 0.76  CALCIUM 10.2   GFR: Estimated Creatinine Clearance: 70 mL/min (by C-G formula based on SCr of 0.76 mg/dL). Liver Function Tests: Recent Labs  Lab 03/10/2020 0827  AST 16  ALT 33  ALKPHOS 62  BILITOT 0.8  PROT 7.2  ALBUMIN 3.7   Recent Labs  Lab 03/03/2020 0827  LIPASE 24   No results for input(s): AMMONIA in the last 168 hours. Coagulation Profile: No results for input(s): INR, PROTIME in the last 168 hours. Cardiac Enzymes: No results for input(s): CKTOTAL, CKMB, CKMBINDEX, TROPONINI in the last 168 hours. BNP (last 3 results) No results for input(s): PROBNP in the last 8760 hours. HbA1C: No results for input(s): HGBA1C in the last 72  hours. CBG: No results for input(s): GLUCAP in the last 168 hours. Lipid Profile: No results for input(s): CHOL, HDL, LDLCALC, TRIG, CHOLHDL, LDLDIRECT in the last 72 hours. Thyroid Function Tests: No results for input(s): TSH, T4TOTAL, FREET4, T3FREE, THYROIDAB in the last 72 hours. Anemia Panel: No results for input(s): VITAMINB12, FOLATE, FERRITIN, TIBC, IRON, RETICCTPCT in the last 72 hours. Urine analysis:    Component Value Date/Time   COLORURINE YELLOW 01/02/2020 0244   APPEARANCEUR CLOUDY (A) 01/02/2020 0244   LABSPEC 1.016 01/02/2020 0244   PHURINE 5.0 01/02/2020 0244   GLUCOSEU 50 (A) 01/02/2020 0244   HGBUR SMALL (A) 01/02/2020 0244   BILIRUBINUR NEGATIVE 01/02/2020 0244   KETONESUR NEGATIVE 01/02/2020 0244   PROTEINUR NEGATIVE 01/02/2020 0244   NITRITE POSITIVE (A) 01/02/2020 0244   LEUKOCYTESUR LARGE (A) 01/02/2020 0244    Radiological Exams on Admission: CT ABDOMEN PELVIS W CONTRAST  Result Date: 03/03/2020 CLINICAL DATA:  General abdominal pain. EXAM: CT ABDOMEN AND PELVIS WITH CONTRAST TECHNIQUE: Multidetector CT imaging of the abdomen and pelvis was performed using the standard protocol following bolus administration of intravenous contrast. CONTRAST:  158mL OMNIPAQUE IOHEXOL 300 MG/ML  SOLN COMPARISON:  CT chest 05/22/2019 FINDINGS: Lower chest: Bibasilar interstitial opacities and trace effusions. Probable right greater than left atelectasis. Hepatobiliary: No focal liver abnormality is seen. No gallstones, gallbladder wall thickening, or biliary dilatation. Pancreas: Unremarkable. No pancreatic ductal dilatation or surrounding inflammatory changes. Spleen: Normal in size without focal abnormality. Adrenals/Urinary Tract: Stable left 3.2 cm adrenal gland nodule. Small hypodense nodule in the inferior right kidney measuring 0.8 cm, too small to fully characterize. No hydronephrosis. No renal calculi. Urinary bladder unremarkable. Stomach/Bowel: Stomach is within normal  limits. Appendix appears normal. No evidence of bowel wall thickening, distention, or inflammatory changes. Colonic diverticula without evidence of diverticulitis. Vascular/Lymphatic: Aortic atherosclerosis. No enlarged abdominal or pelvic lymph nodes. Reproductive: Uterus and bilateral adnexa are unremarkable. Other: No abdominal wall abnormality.  No abdominopelvic ascites. Musculoskeletal: Chronic severe wedge compression deformity at L1. IMPRESSION: 1. No acute intra-abdominal or intrapelvic process. 2. Bibasilar interstitial opacities and trace effusions, possibly reflecting pulmonary edema, infection not excluded. 3. Colonic diverticulosis without evidence of diverticulitis. 4. Aortic atherosclerosis. Aortic Atherosclerosis (ICD10-I70.0). Electronically Signed  By: Audie Pinto M.D.   On: 02/28/2020 14:51   DG Chest Portable 1 View  Result Date: 03/01/2020 CLINICAL DATA:  Shortness of breath EXAM: PORTABLE CHEST 1 VIEW COMPARISON:  01/11/2020 FINDINGS: Interstitial prominence. Probable mild atelectasis at the lung bases. No pleural effusion. No pneumothorax. Normal heart size. IMPRESSION: Interstitial prominence is likely chronic with possible superimposed mild edema. Electronically Signed   By: Macy Mis M.D.   On: 03/07/2020 14:04    EKG: Independently reviewed.  Sinus tachycardia  Assessment/Plan Active Problems:   Acute hypoxemic respiratory failure (HCC)  Acute hypoxemic respiratory failure Suspected acute on chronic diastolic congestive heart failure Suspected exacerbation of COPD Patient presented with symptoms consistent with acute decompensated heart failure and concomitant COPD Dependent on 4 L nasal cannula time my evaluation Did not appear to have any baseline oxygen requirement Imaging demonstrative of interstitial edema Per home medication reconciliation patient does not take any diuretics or goal-directed medical therapy for heart failure Does not appear to be on  any scheduled COPD  meds either 2d echo 01/01/20: normal EF.  Grade 2 diastolic dysfunction Plan: Admit inpatient, MedSurg Lasix 40 mg IV twice daily Daily weights Strict I's and O's Reds vest reading on admission and discharge No need to repeat echo at this time Fluid restrict, heart healthy diet Low-dose lisinopril Will not initiate beta-blocker for now given acute respiratory failure IV Solu-Medrol 40 mg every 8 hours x1 day, followed by prednisone 40 mg daily x5 days Scheduled DuoNebs every 6 hours Brovana twice daily Pulmicort twice daily Wean oxygen as tolerated Empiric Levaquin RT consult  Coronary artery disease History of CVA Continue dual antiplatelet therapy  Hypertension lisinopril as above Continue home diltiazem As needed hydralazine  Suspected malnutrition RD consult Continue Megace  Hyperlipidemia Continue home Lipitor  Depression Continue Lexapro   DVT prophylaxis: Lovenox  code Status: Full Family Communication: None at bedside Disposition Plan: Expect return to previous home environment  consults called: None Admission status: Inpatient, MedSurg   Sidney Ace MD [facility]   If 7PM-7AM, please contact night-coverage   03/13/2020, 4:54 PM

## 2020-02-24 NOTE — Progress Notes (Signed)
Patient has yellow  MEWS, hospitalist aware, will continue vitals q2hr x 2 and then q4hr. Patient has scheduled cardizem IV ordered. Will contiue to monitor.

## 2020-02-24 NOTE — ED Notes (Signed)
Pt cleaned and gown/brief changed by this RN and Dee,RN. Pt repositioned in bed. Pt being transported to floor at this time

## 2020-02-24 NOTE — Patient Outreach (Signed)
Care Coordination  03/05/2020  Anne [name] 07-05-1961 [id number]  [place] coordination- Collaboration with IPR SW  [name] RN [name] noted Anne 03/08/2020 Epic note from Anne [name], Inpatient rehab SW [facility] RN [name] called and spoke with Anne [name] about [facility] RN [name] outreach to [employer] customer service and pending assistance from PCP office with referral to [place] shared that there is Anne pending [facility] referral Discussed managed medicaid [facility] RN [name] shared via e-mail the list of in network home health agencies shared and verified by [facility] customer service staff [facility] RN [name] requested possible return call if [facility] agrees to provide service to patient  Anne [name] outreached to update Anne [name] on referral and present ED status, pending labs +, further ED MD evaluation Anne [name] voiced appreciation of outreach  [facility] RN [name] follow up with Ms Hutcherson/Anne [name] after discharge within the next 1-3 business days Pt encouraged to return Anne call to [facility] RN [name] prn  Anne Duford L. [name], RN, BSN, [place] Coordinator Office number [phone] Mobile number [phone]  Main [facility] number [phone] Fax number [phone]

## 2020-02-25 ENCOUNTER — Other Ambulatory Visit: Payer: Self-pay | Admitting: *Deleted

## 2020-02-25 LAB — URINALYSIS, COMPLETE (UACMP) WITH MICROSCOPIC
Bacteria, UA: NONE SEEN
Bilirubin Urine: NEGATIVE
Glucose, UA: NEGATIVE mg/dL
Ketones, ur: NEGATIVE mg/dL
Leukocytes,Ua: NEGATIVE
Nitrite: NEGATIVE
Protein, ur: NEGATIVE mg/dL
RBC / HPF: >50 RBC/hpf — ABNORMAL HIGH (ref 0–5)
Specific Gravity, Urine: 1.023 (ref 1.005–1.030)
Squamous Epithelial / HPF: NONE SEEN (ref 0–5)
pH: 5 (ref 5.0–8.0)

## 2020-02-25 LAB — CBC
HCT: 41.5 % (ref 36.0–46.0)
Hemoglobin: 13.6 g/dL (ref 12.0–15.0)
MCH: 28.2 pg (ref 26.0–34.0)
MCHC: 32.8 g/dL (ref 30.0–36.0)
MCV: 86.1 fL (ref 80.0–100.0)
Platelets: 170 10*3/uL (ref 150–400)
RBC: 4.82 MIL/uL (ref 3.87–5.11)
RDW: 13.2 % (ref 11.5–15.5)
WBC: 10.8 10*3/uL — ABNORMAL HIGH (ref 4.0–10.5)
nRBC: 0 % (ref 0.0–0.2)

## 2020-02-25 LAB — BASIC METABOLIC PANEL
Anion gap: 13 (ref 5–15)
BUN: 23 mg/dL — ABNORMAL HIGH (ref 6–20)
CO2: 22 mmol/L (ref 22–32)
Calcium: 10.9 mg/dL — ABNORMAL HIGH (ref 8.9–10.3)
Chloride: 103 mmol/L (ref 98–111)
Creatinine, Ser: 1.47 mg/dL — ABNORMAL HIGH (ref 0.44–1.00)
GFR calc Af Amer: 45 mL/min — ABNORMAL LOW (ref >60)
GFR calc non Af Amer: 38 mL/min — ABNORMAL LOW (ref >60)
Glucose, Bld: 128 mg/dL — ABNORMAL HIGH (ref 70–99)
Potassium: 4.5 mmol/L (ref 3.5–5.1)
Sodium: 138 mmol/L (ref 135–145)

## 2020-02-25 LAB — HIV ANTIBODY (ROUTINE TESTING W REFLEX): HIV Screen 4th Generation wRfx: NONREACTIVE

## 2020-02-25 MED ORDER — SODIUM CHLORIDE 0.9 % IV SOLN
100.0000 mg | Freq: Two times a day (BID) | INTRAVENOUS | Status: AC
  Administered 2020-02-26 – 2020-02-29 (×8): 100 mg via INTRAVENOUS
  Filled 2020-02-25 (×9): qty 100

## 2020-02-25 MED ORDER — ADULT MULTIVITAMIN W/MINERALS CH
1.0000 | ORAL_TABLET | Freq: Every day | ORAL | Status: DC
  Administered 2020-02-26 – 2020-03-02 (×5): 1 via ORAL
  Filled 2020-02-25 (×6): qty 1

## 2020-02-25 MED ORDER — IPRATROPIUM-ALBUTEROL 0.5-2.5 (3) MG/3ML IN SOLN
3.0000 mL | Freq: Two times a day (BID) | RESPIRATORY_TRACT | Status: DC
  Administered 2020-02-25 – 2020-03-02 (×11): 3 mL via RESPIRATORY_TRACT
  Filled 2020-02-25 (×12): qty 3

## 2020-02-25 MED ORDER — IPRATROPIUM-ALBUTEROL 0.5-2.5 (3) MG/3ML IN SOLN: 3.0000 mL | Freq: Four times a day (QID) | RESPIRATORY_TRACT | Status: DC | PRN

## 2020-02-25 MED ORDER — SODIUM CHLORIDE 0.9 % IV SOLN
1.0000 g | INTRAVENOUS | Status: AC
  Administered 2020-02-26 – 2020-02-29 (×4): 1 g via INTRAVENOUS
  Filled 2020-02-25: qty 10
  Filled 2020-02-25 (×2): qty 1
  Filled 2020-02-25: qty 10

## 2020-02-25 MED ORDER — LEVOFLOXACIN IN D5W 750 MG/150ML IV SOLN: 750.0000 mg | INTRAVENOUS | Status: DC

## 2020-02-25 MED ORDER — ENSURE ENLIVE PO LIQD
237.0000 mL | Freq: Three times a day (TID) | ORAL | Status: DC
  Administered 2020-02-25 – 2020-03-02 (×8): 237 mL via ORAL

## 2020-02-25 NOTE — Progress Notes (Signed)
Pt worked with PT today. Pt was able to tolerate transport from bed to chair. Will cont to monitor.

## 2020-02-25 NOTE — Progress Notes (Signed)
OT Cancellation Note  Patient Details Name: Anne [name] MRN: [id number] DOB: [birth year]   Cancelled Treatment:    Reason Eval/Treat Not Completed: Other (comment)  RN and CNA in room placing/adjusting lines/leads at this time. Will f/u for OT evaluation as able.   Gerrianne Scale, [place], OTR/L ascom [phone] 02/25/20, 3:00 PM

## 2020-02-25 NOTE — Evaluation (Signed)
Physical Therapy Evaluation Patient Details Name: Anne [name] MRN: [id number] DOB: October 21, 1960 Today's Date: 02/25/2020   History of Present Illness  Anne [name] is a 31yoF who comes to [facility] on 8/11 c RLQ pain. PMH: CAD, asthmas, tobacco abuse, polysubstance abuse,major depressive disorder.  Pt was recently at [facility] in [place] after CVA returned to home on 8/3, whereas paysource complications precluded option of STR/SNF/HHPT. ABD pain cleared, no imaging findings. After further workup, pt admitted c acute hypoxic respiratory failure secondary to concomitant COPD and CHF.  Clinical Impression  Pt admitted with above diagnosis. Pt currently with functional limitations due to the deficits listed below (see "PT Problem List"). Upon entry, pt in bed, awake and agreeable to participate. Pt somewhat withdrawn, responds verbally only intermittently using very few words. Pt tearful and sad in much of session, consistent with RN report of earlier in day. Pt continues to demonstrate Left sided neglect and Left hemiplegia consistent with rehab notes from last week, no evidence of recent change. Pt denies any new deficits since DC to home. MaxA required for supine to sitting, maxA +1-2 for transfers. Pt follows commands well and initiates when cued, but does have motoric limitations, particularly for forward lean prior to standing. Pt is lift appropriate for OOB transfers with nursing. Functional mobility assessment demonstrates increased effort/time requirements, poor tolerance, and need for physical assistance, whereas the patient performed these at a higher level of independence PTA. Pt will benefit from skilled PT intervention to increase independence and safety with basic mobility in preparation for discharge to the venue listed below.       Follow Up Recommendations Home health PT;Supervision/Assistance - 24 hour    Equipment Recommendations       Recommendations for Other Services        Precautions / Restrictions Precautions Precautions: Fall Precaution Comments: LUE hemiplegia, Left negrlect      Mobility  Bed Mobility Overal bed mobility: Needs Assistance Bed Mobility: Supine to Sit     Supine to sit: Max assist        Transfers Overall transfer level: Needs assistance   Transfers: Squat Pivot Transfers;Sit to/from Stand Sit to Stand: Max assist Stand pivot transfers: Max assist          Ambulation/Gait                Stairs            Wheelchair Mobility    Modified Rankin (Stroke Patients Only)       Balance                                             Pertinent Vitals/Pain Pain Assessment: No/denies pain    Home Living Family/patient expects to be discharged to:: Private residence Living Arrangements: Children Available Help at Discharge: Family;Available 24 hours/day Type of Home: House Home Access: Stairs to enter Entrance Stairs-Rails: None Entrance Stairs-Number of Steps: 3 Home Layout: One level Home Equipment:  (no caregiver available for updates; pt recently DC to home 1 week ago with DME; will defer to recent care management note for DME needs)      Prior Function Level of Independence: Needs assistance   Gait / Transfers Assistance Needed: nonambulatory; hoyerlift in home since DC  ADL's / Homemaking Assistance Needed: needs assist        Hand Dominance  Extremity/Trunk Assessment   Upper Extremity Assessment Upper Extremity Assessment: LUE deficits/detail LUE Deficits / Details: hypertonic hemiplegia    Lower Extremity Assessment Lower Extremity Assessment: LLE deficits/detail LLE Deficits / Details: hemiplegia, neglect issues, ataxia LLE Coordination: decreased gross motor;decreased fine motor       Communication   Communication: Receptive difficulties;Expressive difficulties  Cognition Arousal/Alertness: Awake/alert Behavior During Therapy:  (tearful and  withdrawn throughout.) Overall Cognitive Status: Difficult to assess                                        General Comments      Exercises     Assessment/Plan    PT Assessment Patient needs continued PT services  PT Problem List Decreased strength;Decreased range of motion;Decreased cognition;Decreased activity tolerance;Decreased balance;Decreased mobility;Decreased knowledge of precautions;Cardiopulmonary status limiting activity;Decreased safety awareness       PT Treatment Interventions DME instruction;Balance training;Gait training;Neuromuscular re-education;Cognitive remediation;Stair training;Patient/family education;Therapeutic activities;Therapeutic exercise;Functional mobility training;Wheelchair mobility training    PT Goals (Current goals can be found in the Care Plan section)  Acute Rehab PT Goals PT Goal Formulation: Patient unable to participate in goal setting Time For Goal Achievement: 03/10/20    Frequency Min 2X/week   Barriers to discharge        Co-evaluation               AM-PAC PT "6 Clicks" Mobility  Outcome Measure Help needed turning from your back to your side while in a flat bed without using bedrails?: Total Help needed moving from lying on your back to sitting on the side of a flat bed without using bedrails?: Total Help needed moving to and from a bed to a chair (including a wheelchair)?: Total Help needed standing up from a chair using your arms (e.g., wheelchair or bedside chair)?: Total Help needed to walk in hospital room?: Total Help needed climbing 3-5 steps with a railing? : Total 6 Click Score: 6    End of Session Equipment Utilized During Treatment: Gait belt Activity Tolerance: Patient tolerated treatment well Patient left: in chair;with chair alarm set;with call bell/phone within reach Nurse Communication: Mobility status PT Visit Diagnosis: Unsteadiness on feet (R26.81);Other abnormalities of gait and  mobility (R26.89);Difficulty in walking, not elsewhere classified (R26.2)    Time: 9485-4627 PT Time Calculation (min) (ACUTE ONLY): 23 min   Charges:   PT Evaluation $PT Eval High Complexity: 1 High PT Treatments $Therapeutic Exercise: 8-22 mins        12:44 PM, 02/25/20 Anne [name], PT, DPT Physical Therapist - [facility]  [phone] ([place])    Parker C 02/25/2020, 12:41 PM

## 2020-02-25 NOTE — Patient Outreach (Signed)
[facility]) Care Management  02/25/2020  Shawnae Rembold [birth year] [id number]   CSW received referral for depression, community resource linking and PCS.  CSW made contact with pt's daughter, [name], by phone and confirmed pt's identity.  CSW introduced self, role and reason for call.  Per daughter, pt was working a full time job prior to her stroke just a few months ago.  "She was independent and is now total care".  [name] states she was released from hospital last Tuesday and due to uncertain barriers, the Home Health had not been out and DME was delayed; "So I had to go out and get some of it".  [name] is working 2 jobs and raising 2 kids while also attempting to care for pt at home. She does not want to place her mom in SNF,however needs help.  "I can't do this alone".  She does have an uncle who helps out while she works.  Pt has Medicaid and would benefit from applying for Disability as well as for PCS personal care services in the home.  CSW advised daughter of the process for this to occur and upon learning pt was readmitted to hospital on yesterday, was encouraged to speak further to the hospital team about dc plans, needs and her concerns.    CSW will also attempt to outreach inpatient team as well to collaborate on pt needs.   Eduard Clos, MSW, [place] Worker  [place] [phone]

## 2020-02-25 NOTE — Progress Notes (Signed)
PROGRESS NOTE    Anne [name]  OQH:[id number] DOB: 1961-03-07 DOA: 03/03/2020 PCP: Donnie Coffin, MD    Brief Narrative: HPI: Anne [name] is a 59 y.o. female with medical history significant of CVA, hypertension, coronary artery disease presents the emergency department for evaluation of abdominal pain associated with weakness and cough.  The patient is post CVA thus little difficult to understand this majority the history is gained by speaking with the ED physician in reading the associated documentation.  Patient reports she is here because she felt weak and more short of breath throughout the day.  No other obvious infectious contacts.  No known sick contacts.  No history of fevers.  No other associated symptoms.  Unable to fully characterize pain.  On my evaluation patient is resting in bed.  She appears chronically ill.  Is difficult to communicate with her secondary to CVA.  However she does understand and nods head yes or no to interview questions.  For the abdominal pain etiology is unclear.  She had a CT abdomen done in the emergency department that did not reveal any acute etiology.  Chest imaging was significant for interstitial edema.  Working diagnosis is acute hypoxic respiratory failure secondary to concomitant COPD and CHF.  8/12: Patient is much more awake today.  Communicative.  No slurred speech.  Remains on 3 to 4 L nasal cannula.   Assessment & Plan:   Active Problems:   Acute hypoxemic respiratory failure (HCC)  Acute hypoxemic respiratory failure Mild acute on chronic diastolic congestive heart failure Suspected exacerbation of COPD Patient presented with symptoms consistent with acute decompensated heart failure and concomitant COPD Improving over interval.  Patient did not appear markedly hypervolemic.  Believe that majority of symptoms driven by underlying COPD. 2d echo 01/01/20: normal EF.  Grade 2 diastolic dysfunction Plan: Continue bronchodilator  therapy Continue p.o. prednisone 40 mg daily x5 days Wean oxygen as tolerated Continue p.o. Levaquin Hold Lasix due to AKI Hold lisinopril due to AKI Respiratory therapy following  Acute kidney injury Suspect prerenal azotemia in the setting of diuresis Hold Lasix and lisinopril for now Avoid nephrotoxins Recheck creatinine in the morning  Coronary artery disease History of CVA Continue dual antiplatelet therapy  Hypertension Lisinopril on hold as above Continue home diltiazem As needed hydralazine  Suspected malnutrition RD consult Continue Megace  Hyperlipidemia Continue home Lipitor  Depression Continue Lexapro    DVT prophylaxis: Lovenox Code Status: Full Family Communication: Attempted to call patient's daughter [name] via phone [phone].  Phone turned off.  Mailbox full. Disposition Plan: Status is: Inpatient  Remains inpatient appropriate because:Inpatient level of care appropriate due to severity of illness   Dispo: The patient is from: Home              Anticipated d/c is to: SNF              Anticipated d/c date is: 3 days              Patient currently is not medically stable to d/c.  Still with acute hypoxic respiratory failure in the setting of acute decompensated COPD and possible mild CHF.  Suspect patient will need 3 days of an additional inpatient monitoring prior to disposition planning       Consultants:   None  Procedures:   None  Antimicrobials:   Levaquin   Subjective: Seen and examined.  Much more awake today.  Endorses back pain.  Objective: Vitals:   02/25/20 0025 02/25/20  0433 02/25/20 0451 02/25/20 0950  BP: 129/71 (!) 106/58  (!) 116/59  Pulse: (!) 124 98 100 (!) 102  Resp: 20 18  18   Temp: 99 F (37.2 C) (!) 97.5 F (36.4 C)  98.2 F (36.8 C)  TempSrc: Oral     SpO2: 92% (!) 87% 91% 90%  Weight:      Height:        Intake/Output Summary (Last 24 hours) at 02/25/2020 1221 Last data filed at  02/25/2020 0630 Gross per 24 hour  Intake 150 ml  Output 500 ml  Net -350 ml   Filed Weights   03/13/2020 0846 03/07/2020 1905  Weight: 68 kg 83.4 kg    Examination:  General: No apparent distress.  Patient appears chronically ill HEENT: Normocephalic, atraumatic Neck, supple, trachea midline, no tenderness Heart: Regular rate and rhythm, S1/S2 normal, no murmurs Lungs: Bibasilar crackles.  Normal work of breathing.  Abdomen: Soft, nontender, nondistended, positive bowel sounds Extremities: Atraumatic.  No clubbing.  Left-sided hemiparesis.  Decreased muscle tone Skin: No rashes or lesions, normal color Neurologic: Left-sided hemiparesis, sensation intact, alert and oriented x3 Psychiatric: Normal affect     Data Reviewed: I have personally reviewed following labs and imaging studies  CBC: Recent Labs  Lab 02/19/2020 0827 02/25/20 0421  WBC 10.6* 10.8*  HGB 13.4 13.6  HCT 42.0 41.5  MCV 86.8 86.1  PLT 176 229   Basic Metabolic Panel: Recent Labs  Lab 02/28/2020 0827 02/25/20 0421  NA 137 138  K 4.2 4.5  CL 106 103  CO2 22 22  GLUCOSE 88 128*  BUN 17 23*  CREATININE 0.76 1.47*  CALCIUM 10.2 10.9*   GFR: Estimated Creatinine Clearance: 46.1 mL/min (A) (by C-G formula based on SCr of 1.47 mg/dL (H)). Liver Function Tests: Recent Labs  Lab 02/27/2020 0827  AST 16  ALT 33  ALKPHOS 62  BILITOT 0.8  PROT 7.2  ALBUMIN 3.7   Recent Labs  Lab 02/27/2020 0827  LIPASE 24   No results for input(s): AMMONIA in the last 168 hours. Coagulation Profile: No results for input(s): INR, PROTIME in the last 168 hours. Cardiac Enzymes: No results for input(s): CKTOTAL, CKMB, CKMBINDEX, TROPONINI in the last 168 hours. BNP (last 3 results) No results for input(s): PROBNP in the last 8760 hours. HbA1C: No results for input(s): HGBA1C in the last 72 hours. CBG: No results for input(s): GLUCAP in the last 168 hours. Lipid Profile: No results for input(s): CHOL, HDL,  LDLCALC, TRIG, CHOLHDL, LDLDIRECT in the last 72 hours. Thyroid Function Tests: No results for input(s): TSH, T4TOTAL, FREET4, T3FREE, THYROIDAB in the last 72 hours. Anemia Panel: No results for input(s): VITAMINB12, FOLATE, FERRITIN, TIBC, IRON, RETICCTPCT in the last 72 hours. Sepsis Labs: No results for input(s): PROCALCITON, LATICACIDVEN in the last 168 hours.  Recent Results (from the past 240 hour(s))  SARS Coronavirus 2 by RT PCR (hospital order, performed in [facility] hospital lab) Nasopharyngeal Nasopharyngeal Swab     Status: None   Collection Time: 02/23/2020  1:22 PM   Specimen: Nasopharyngeal Swab  Result Value Ref Range Status   SARS Coronavirus 2 NEGATIVE NEGATIVE Final    Comment: (NOTE) SARS-CoV-2 target nucleic acids are NOT DETECTED.  The SARS-CoV-2 RNA is generally detectable in upper and lower respiratory specimens during the acute phase of infection. The lowest concentration of SARS-CoV-2 viral copies this assay can detect is 250 copies / mL. A negative result does not preclude SARS-CoV-2 infection and  should not be used as the sole basis for treatment or other patient management decisions.  A negative result may occur with improper specimen collection / handling, submission of specimen other than nasopharyngeal swab, presence of viral mutation(s) within the areas targeted by this assay, and inadequate number of viral copies (<250 copies / mL). A negative result must be combined with clinical observations, patient history, and epidemiological information.  Fact Sheet for Patients:   StrictlyIdeas.no  Fact Sheet for Healthcare Providers: BankingDealers.co.za  This test is not yet approved or  cleared by the [place] FDA and has been authorized for detection and/or diagnosis of SARS-CoV-2 by FDA under an Emergency Use Authorization (EUA).  This EUA will remain in effect (meaning this test can be used) for  the duration of the COVID-19 declaration under Section 564(b)(1) of the Act, 21 U.S.C. section 360bbb-3(b)(1), unless the authorization is terminated or revoked sooner.  Performed at [facility], [address] [place], [place] [address]          Radiology Studies: CT ABDOMEN PELVIS W CONTRAST  Result Date: 02/15/2020 CLINICAL DATA:  General abdominal pain. EXAM: CT ABDOMEN AND PELVIS WITH CONTRAST TECHNIQUE: Multidetector CT imaging of the abdomen and pelvis was performed using the standard protocol following bolus administration of intravenous contrast. CONTRAST:  126mL OMNIPAQUE IOHEXOL 300 MG/ML  SOLN COMPARISON:  CT chest 05/22/2019 FINDINGS: Lower chest: Bibasilar interstitial opacities and trace effusions. Probable right greater than left atelectasis. Hepatobiliary: No focal liver abnormality is seen. No gallstones, gallbladder wall thickening, or biliary dilatation. Pancreas: Unremarkable. No pancreatic ductal dilatation or surrounding inflammatory changes. Spleen: Normal in size without focal abnormality. Adrenals/Urinary Tract: Stable left 3.2 cm adrenal gland nodule. Small hypodense nodule in the inferior right kidney measuring 0.8 cm, too small to fully characterize. No hydronephrosis. No renal calculi. Urinary bladder unremarkable. Stomach/Bowel: Stomach is within normal limits. Appendix appears normal. No evidence of bowel wall thickening, distention, or inflammatory changes. Colonic diverticula without evidence of diverticulitis. Vascular/Lymphatic: Aortic atherosclerosis. No enlarged abdominal or pelvic lymph nodes. Reproductive: Uterus and bilateral adnexa are unremarkable. Other: No abdominal wall abnormality.  No abdominopelvic ascites. Musculoskeletal: Chronic severe wedge compression deformity at L1. IMPRESSION: 1. No acute intra-abdominal or intrapelvic process. 2. Bibasilar interstitial opacities and trace effusions, possibly reflecting pulmonary edema, infection  not excluded. 3. Colonic diverticulosis without evidence of diverticulitis. 4. Aortic atherosclerosis. Aortic Atherosclerosis (ICD10-I70.0). Electronically Signed   By: Audie Pinto M.D.   On: 02/22/2020 14:51   DG Chest Portable 1 View  Result Date: 02/22/2020 CLINICAL DATA:  Shortness of breath EXAM: PORTABLE CHEST 1 VIEW COMPARISON:  01/11/2020 FINDINGS: Interstitial prominence. Probable mild atelectasis at the lung bases. No pleural effusion. No pneumothorax. Normal heart size. IMPRESSION: Interstitial prominence is likely chronic with possible superimposed mild edema. Electronically Signed   By: Macy Mis M.D.   On: 02/17/2020 14:04        Scheduled Meds: . arformoterol  15 mcg Nebulization BID  . aspirin EC  325 mg Oral Daily  . atorvastatin  40 mg Oral Daily  . budesonide (PULMICORT) nebulizer solution  0.25 mg Nebulization BID  . clopidogrel  75 mg Oral Daily  . diltiazem  30 mg Oral Q6H  . enoxaparin (LOVENOX) injection  40 mg Subcutaneous Q24H  . escitalopram  10 mg Oral Daily  . folic acid  1 mg Oral Daily  . ipratropium-albuterol  3 mL Nebulization BID  . megestrol  400 mg Oral BID  . predniSONE  40 mg Oral Q breakfast  . thiamine  100 mg Oral Daily   Continuous Infusions: . [START ON 02/26/2020] levofloxacin (LEVAQUIN) IV       LOS: 1 day    Time spent: 25 minutes    Sidney Ace, MD [facility] Pager [phone]  If 7PM-7AM, please contact night-coverage 02/25/2020, 12:21 PM

## 2020-02-25 NOTE — Progress Notes (Addendum)
   03/15/2020 1845  Assess: MEWS Score  Temp 99 F (37.2 C)  BP (!) 162/97  Pulse Rate (!) 125  Resp (!) 24  SpO2 96 %  Assess: MEWS Score  MEWS Temp 0  MEWS Systolic 0  MEWS Pulse 2  MEWS RR 1  MEWS LOC 0  MEWS Score 3  MEWS Score Color Yellow  Assess: if the MEWS score is Yellow or Red  Were vital signs taken at a resting state? Yes  Focused Assessment Change from prior assessment (see assessment flowsheet)  Early Detection of Sepsis Score *See Row Information* Low  MEWS guidelines implemented *See Row Information* Yes  Treat  MEWS Interventions Administered prn meds/treatments;Administered scheduled meds/treatments  Take Vital Signs  Increase Vital Sign Frequency  Yellow: Q 2hr X 2 then Q 4hr X 2, if remains yellow, continue Q 4hrs  Escalate  MEWS: Escalate Yellow: discuss with charge nurse/RN and consider discussing with provider and RRT  Notify: Charge Nurse/RN  Name of Charge Nurse/RN Notified Antavious Spanos/Dedra  Date Charge Nurse/RN Notified 02/25/20  Time Charge Nurse/RN Notified 2481  Notify: Provider  Provider Name/Title Sreenath  Date Provider Notified 03/05/2020  Time Provider Notified 1844  Notification Type Page  Notification Reason Change in status  Response Other (Comment)  Date of Provider Response 03/13/2020  Time of Provider Response [phone]

## 2020-02-25 NOTE — Progress Notes (Signed)
Paged the doctor about bladder scan results. At 1321, bladder scan showed 163 ml and at 1720 showed 191 ml of urine. Doctor stated not to straight cath unless pt is experiencing supra pubic pain and/or bladder can shows 350 ml or more. Pt denies supra pubic pain at this time.

## 2020-02-25 NOTE — Progress Notes (Signed)
Initial Nutrition Assessment  DOCUMENTATION CODES:   Not applicable  INTERVENTION:   Ensure Enlive po TID, each supplement provides 350 kcal and 20 grams of protein  MVI daily   Dysphagia 3 diet   NUTRITION DIAGNOSIS:   Inadequate oral intake related to acute illness as evidenced by meal completion < 25%.  GOAL:   Patient will meet greater than or equal to 90% of their needs  MONITOR:   PO intake, Supplement acceptance, Labs, Weight trends, Skin, I & O's  REASON FOR ASSESSMENT:   Consult Assessment of nutrition requirement/status  ASSESSMENT:   59 y.o. female with medical history significant of CVA, hypertension, coronary artery disease presents the emergency department for evaluation of abdominal pain associated with weakness and cough.   Met with pt in room today. Pt is a poor historian and unable to provide much history. Pt reports poor appetite and oral intake pta. Pt reports that she is not eating well in the hospital either. Per chart, pt eating <25% of meals during her last admit. Pt did require cortrak tube placement and tube feeds while in rehab. Pt was eventually approved for dysphagia 3 diet by SLP but continued to have poor oral intake. Pt was drinking supplements during her last admit. RD will add supplements and MVI to help pt meet her estimated needs. RD will also change pt over to a dysphagia 3 diet. Per chart, pt down 18lbs(8%) in 2 months; this is significant.    Medications reviewed and include: aspirin, plavix, lovenox, folic acid, megace, prednisone, thiamine, ceftriaxone, doxycycline   Labs reviewed: BUN 23(H), creat 1.47(H), Ca 10.9(H) Wbc- 10.8(H)  NUTRITION - FOCUSED PHYSICAL EXAM:    Most Recent Value  Orbital Region No depletion  Upper Arm Region No depletion  Thoracic and Lumbar Region No depletion  Buccal Region No depletion  Temple Region No depletion  Clavicle Bone Region No depletion  Clavicle and Acromion Bone Region No depletion   Scapular Bone Region No depletion  Dorsal Hand No depletion  Patellar Region No depletion  Anterior Thigh Region No depletion  Posterior Calf Region No depletion  Edema (RD Assessment) None  Hair Reviewed  Eyes Reviewed  Mouth Reviewed  Skin Reviewed  Nails Reviewed     Diet Order:   Diet Order            DIET DYS 3 Room service appropriate? Yes; Fluid consistency: Thin; Fluid restriction: 1200 mL Fluid  Diet effective now                EDUCATION NEEDS:   Education needs have been addressed  Skin:  Skin Assessment: Reviewed RN Assessment (ecchymosis)  Last BM:  8/11  Height:   Ht Readings from Last 1 Encounters:  02/23/2020 _0  (1.727 m)    Weight:   Wt Readings from Last 1 Encounters:  03/12/2020 83.4 kg    Ideal Body Weight:  63.6 kg  BMI:  Body mass index is 27.96 kg/m.  Estimated Nutritional Needs:   Kcal:  1900-2200kcal/day  Protein:  95-110g/day  Fluid:  1236m per MD  CKoleen DistanceMS, RD, LDN Please refer to [facility] and/or RD on-call/weekend/after hours pager

## 2020-02-25 NOTE — Progress Notes (Signed)
Patient not voided this shift, In and Out perform per PRN order, patient tolerated well, will continue to monitor.

## 2020-02-25 NOTE — TOC Progression Note (Addendum)
Transition of Care [facility]) - Progression Note    Patient Details  Name: Rivka Baune MRN: [id number] Date of Birth: April 21, 1961  Transition of [facility]) CM/SW Contact  Reyanne Hussar, Gardiner Rhyme, LCSW Phone Number: 02/25/2020, 2:29 PM  Clinical Narrative:   Spoke with daughter_[name]-via telephone to discuss numerous concerns see Eduard Clos note below. She is trying her best for her Mom, working two jobs and has two small children. She needs home health and PCS. Have reached out to home health agencies and will call number daughter gave me to talk with [place] [facility] care? Will see about signing up for Medicaid transportation. Will call daughter to give her Alleen Borne Medicaid transport [phone], have given to daughter. Will try to get MD to complete PCS form while here. Will continue to work on discharge needs. Did speak with Doreene Eland SW and she will continue to work on needs and follow once DC.  3:33 pm [facility] are trying to take her referral-PT,OT,RN. MD will place order. Can see next week.        Expected Discharge Plan and Services                                                 Social Determinants of Health (SDOH) Interventions    Readmission Risk Interventions No flowsheet data found.

## 2020-02-26 ENCOUNTER — Ambulatory Visit: Payer: Self-pay | Admitting: *Deleted

## 2020-02-26 ENCOUNTER — Other Ambulatory Visit: Payer: Self-pay | Admitting: *Deleted

## 2020-02-26 LAB — CBC WITH DIFFERENTIAL/PLATELET
Abs Immature Granulocytes: 0.12 10*3/uL — ABNORMAL HIGH (ref 0.00–0.07)
Basophils Absolute: 0 10*3/uL (ref 0.0–0.1)
Basophils Relative: 0 %
Eosinophils Absolute: 0 10*3/uL (ref 0.0–0.5)
Eosinophils Relative: 0 %
HCT: 40.2 % (ref 36.0–46.0)
Hemoglobin: 13.1 g/dL (ref 12.0–15.0)
Immature Granulocytes: 1 %
Lymphocytes Relative: 7 %
Lymphs Abs: 1.3 10*3/uL (ref 0.7–4.0)
MCH: 28.4 pg (ref 26.0–34.0)
MCHC: 32.6 g/dL (ref 30.0–36.0)
MCV: 87.2 fL (ref 80.0–100.0)
Monocytes Absolute: 0.8 10*3/uL (ref 0.1–1.0)
Monocytes Relative: 4 %
Neutro Abs: 17.9 10*3/uL — ABNORMAL HIGH (ref 1.7–7.7)
Neutrophils Relative %: 88 %
Platelets: 200 10*3/uL (ref 150–400)
RBC: 4.61 MIL/uL (ref 3.87–5.11)
RDW: 13.2 % (ref 11.5–15.5)
WBC: 20.1 10*3/uL — ABNORMAL HIGH (ref 4.0–10.5)
nRBC: 0 % (ref 0.0–0.2)

## 2020-02-26 LAB — BASIC METABOLIC PANEL
Anion gap: 12 (ref 5–15)
BUN: 44 mg/dL — ABNORMAL HIGH (ref 6–20)
CO2: 20 mmol/L — ABNORMAL LOW (ref 22–32)
Calcium: 11 mg/dL — ABNORMAL HIGH (ref 8.9–10.3)
Chloride: 101 mmol/L (ref 98–111)
Creatinine, Ser: 1.36 mg/dL — ABNORMAL HIGH (ref 0.44–1.00)
GFR calc Af Amer: 49 mL/min — ABNORMAL LOW (ref >60)
GFR calc non Af Amer: 42 mL/min — ABNORMAL LOW (ref >60)
Glucose, Bld: 117 mg/dL — ABNORMAL HIGH (ref 70–99)
Potassium: 3.9 mmol/L (ref 3.5–5.1)
Sodium: 133 mmol/L — ABNORMAL LOW (ref 135–145)

## 2020-02-26 NOTE — Patient Outreach (Addendum)
   Care Coordination - Case Manager  02/26/2020  Haani Hodgman 02/18/1961 [id number]  Subjective:  Shatika Vi is an 59 y.o. year old female who is a primary patient of Aycock, Ngwe A, MD.  Ms. Vawter was given information about Medicaid Managed Care team care coordination services today. Kevia Payne agreed to services and verbal consent obtained  Review of patient status, laboratory and other test data was performed as part of evaluation for provision of services.  SDOH: SDOH Screenings   Alcohol Screen:   . Last Alcohol Screening Score (AUDIT):   Depression (PHQ2-9): Medium Risk  . PHQ-2 Score: 10  Financial Resource Strain: Low Risk   . Difficulty of Paying Living Expenses: Not very hard  Food Insecurity: No Food Insecurity  . Worried About Charity fundraiser in the Last Year: Never true  . Ran Out of Food in the Last Year: Never true  Housing: Low Risk   . Last Housing Risk Score: 0  Physical Activity: Inactive  . Days of Exercise per Week: 0 days  . Minutes of Exercise per Session: 0 min  Social Connections: Moderately Integrated  . Frequency of Communication with Friends and Family: Twice a week  . Frequency of Social Gatherings with Friends and Family: Twice a week  . Attends Religious Services: 1 to 4 times per year  . Active Member of Clubs or Organizations: No  . Attends Archivist Meetings: 1 to 4 times per year  . Marital Status: Never married  Stress: No Stress Concern Present  . Feeling of Stress : Only a little  Tobacco Use: High Risk  . Smoking Tobacco Use: Current Every Day Smoker  . Smokeless Tobacco Use: Never Used  Transportation Needs: Unmet Transportation Needs  . Lack of Transportation (Medical): Yes  . Lack of Transportation (Non-Medical): Yes     Objective:    No Known Allergies  Assessment:  Ad[facility] to [facility] since 02/28/2020  On 02/26/2020 [facility] RN CM sent an in basket message to [place] SW to  update on [facility] interventions for patient and reported outpatient needs Encouraged return outreach to [place] if [facility] RN CM is able to assist [facility] RN CM updated hospital SW of pending referral of home care from [facility] care [phone] office number,   [phone] Fax number,  Contact person doris at [phone] Also updated hospital SW of daughter requested assistance for a walker and incontinence supplies to assist also if she goes home  Also a renewal/new Rx for her advair rx with [facility]  The patient had a Rx sent by pcp to the [facility] in June 2020 but never picked it up    Collaboration with [facility] SW on 02/26/20 0830  An outreach attempt to [place] care Doric [phone] No answer. [facility] RN CM left HIPAA [facility] Portability and Accountability Act) compliant voicemail message along with CM's contact info.     Plan: [facility] RN CM follow up with Ms Madole/[name] after discharge   [place]. [name], RN, BSN, [place] Coordinator Office number [phone] Mobile number [phone]  Main [facility] number [phone] Fax number [phone]

## 2020-02-26 NOTE — Evaluation (Signed)
Occupational Therapy Evaluation Patient Details Name: Anne [name] MRN: [id number] DOB: 1961/05/08 Today's Date: 02/26/2020    History of Present Illness Anne [name] is a 71yoF who comes to [facility] on 8/11 c RLQ pain. PMH: CAD, asthmas, tobacco abuse, polysubstance abuse,major depressive disorder.  Pt was recently at [facility] in [place] after CVA returned to home on 8/3, whereas paysource complications precluded option of STR/SNF/HHPT. ABD pain cleared, no imaging findings. After further workup, pt admitted c acute hypoxic respiratory failure secondary to concomitant COPD and CHF.   Clinical Impression   Pt was seen for OT evaluation this date. Prior to hospital admission, pt had just returned home from CIR with DME and dtr was attempting to assist but has two jobs and two small children. Currently pt demonstrates impairments as described below (See OT problem list) which functionally limit her ability to perform ADL/self-care tasks. Pt currently requires MAX A with bed mobility (rolling), MAX A with UB ADLs and MAX to TOTAL A with LB ADLs at bed level.  Pt would benefit from skilled OT to address noted impairments and functional limitations (see below for any additional details) in order to maximize safety and independence while minimizing falls risk and caregiver burden. Upon hospital discharge, recommend SNF to maximize pt safety and return to PLOF.     Follow Up Recommendations  SNF;Supervision/Assistance - 24 hour    Equipment Recommendations  Other (comment) (defer to next level of care, appears pt has necessary equipment for home)    Recommendations for Other Services       Precautions / Restrictions Precautions Precautions: Fall Precaution Comments: LUE hemiplegia, Left neglect Restrictions Weight Bearing Restrictions: No      Mobility Bed Mobility Overal bed mobility: Needs Assistance Bed Mobility: Rolling Rolling: Max assist;+2 for physical assistance         General  bed mobility comments: TOTAL A with R lateral roll, Makes good effort with MAX A to L lateral roll  Transfers                 General transfer comment: NT    Balance                                           ADL either performed or assessed with clinical judgement   ADL Overall ADL's : Needs assistance/impaired                                       General ADL Comments: MAX A with most bed level UB ADLs and MAX to TOTAL A with most bed level LB ADLs (does make some effort to raise L LE despite veery low strength and fxl ROM). Pt requires MOD verbal/tactile cues to sequence tasks, little to no task initiation. Able to use R UE to contribute to anterior peri care at bed level     Vision Patient Visual Report: Other (comment) (pt with some appearance of L neglect, benefits frmo tactile cue to encourage visual attention to task.)       Perception     Praxis      Pertinent Vitals/Pain Pain Assessment: No/denies pain     Hand Dominance Right   Extremity/Trunk Assessment Upper Extremity Assessment Upper Extremity Assessment: LUE deficits/detail;RUE deficits/detail RUE Deficits / Details: ROM WFL, but pt  tends to leave in flexed shoulder and elbow position with hand behind head. Strength grossly 4/5, but difficult to assess d/t decreased cue following/sequencing LUE Deficits / Details: hypertonic hemiplegia LUE Coordination: decreased fine motor;decreased gross motor   Lower Extremity Assessment Lower Extremity Assessment: Defer to PT evaluation;LLE deficits/detail LLE Deficits / Details: hemiplegia, neglect issues, ataxia LLE Coordination: decreased gross motor;decreased fine motor       Communication Communication Communication: Receptive difficulties;Expressive difficulties   Cognition Arousal/Alertness: Awake/alert Behavior During Therapy:  (tearful/withdrawn) Overall Cognitive Status: Difficult to assess                                      General Comments  NT    Exercises Other Exercises Other Exercises: OT facilitates ed with pt re: role of OT. Pt with limited responses/limited engagement throughout. Difficult to assess understanding   Shoulder Instructions      Home Living Family/patient expects to be discharged to:: Private residence Living Arrangements: Children Available Help at Discharge: Family;Available 24 hours/day Type of Home: House Home Access: Stairs to enter [employer] of Steps: 3 Entrance Stairs-Rails: None Home Layout: One level     Bathroom Shower/Tub: Teacher, early years/pre: Standard     Home Equipment: Other (comment)   Additional Comments: no caregiver available for updates; pt recently DC to home 1 week ago with DME; will defer to recent care management note for DME needs  Lives With: Family    Prior Functioning/Environment Level of Independence: Needs assistance  Gait / Transfers Assistance Needed: nonambulatory; hoyerlift in home since DC ADL's / Homemaking Assistance Needed: needs assist   Comments: Was working prior to previous hospitalization        OT Problem List: Decreased strength;Decreased range of motion;Decreased activity tolerance;Impaired balance (sitting and/or standing);Decreased coordination;Decreased cognition;Impaired vision/perception;Decreased safety awareness;Decreased knowledge of use of DME or AE;Impaired tone;Impaired UE functional use;Cardiopulmonary status limiting activity      OT Treatment/Interventions: Self-care/ADL training;Therapeutic exercise;DME and/or AE instruction;Therapeutic activities;Balance training;Patient/family education;Energy conservation    OT Goals(Current goals can be found in the care plan section) Acute Rehab OT Goals Patient Stated Goal: none stated OT Goal Formulation: Patient unable to participate in goal setting Time For Goal Achievement: 03/11/20 Potential to Achieve  Goals: Fair  OT Frequency: Min 1X/week   Barriers to D/C:            Co-evaluation              AM-PAC OT "6 Clicks" Daily Activity     Outcome Measure Help from another person eating meals?: A Little Help from another person taking care of personal grooming?: A Little Help from another person toileting, which includes using toliet, bedpan, or urinal?: A Lot Help from another person bathing (including washing, rinsing, drying)?: A Lot Help from another person to put on and taking off regular upper body clothing?: A Little Help from another person to put on and taking off regular lower body clothing?: A Lot 6 Click Score: 15   End of Session Nurse Communication: Other (comment) (commication to CNA re: purewick replacement)  Activity Tolerance: Patient tolerated treatment well Patient left: in bed;with call bell/phone within reach;with bed alarm set  OT Visit Diagnosis: Other abnormalities of gait and mobility (R26.89);Muscle weakness (generalized) (M62.81)                Time: 5035-4656 OT Time Calculation (min): 47  min Charges:  OT General Charges $OT Visit: 1 Visit OT Evaluation $OT Eval Moderate Complexity: 1 Mod OT Treatments $Self Care/Home Management : 8-22 mins $Therapeutic Activity: 8-22 mins  Gerrianne Scale, MS, OTR/L ascom [phone] 02/26/20, 1:45 PM

## 2020-02-26 NOTE — Progress Notes (Signed)
PROGRESS NOTE    Imagine Nest  JIR:[id number] DOB: 03/25/1961 DOA: 03/11/2020 PCP: Donnie Coffin, MD    Brief Narrative: HPI: Anne [name] is a 59 y.o. female with medical history significant of CVA, hypertension, coronary artery disease presents the emergency department for evaluation of abdominal pain associated with weakness and cough.  The patient is post CVA thus little difficult to understand this majority the history is gained by speaking with the ED physician in reading the associated documentation.  Patient reports she is here because she felt weak and more short of breath throughout the day.  No other obvious infectious contacts.  No known sick contacts.  No history of fevers.  No other associated symptoms.  Unable to fully characterize pain.  On my evaluation patient is resting in bed.  She appears chronically ill.  Is difficult to communicate with her secondary to CVA.  However she does understand and nods head yes or no to interview questions.  For the abdominal pain etiology is unclear.  She had a CT abdomen done in the emergency department that did not reveal any acute etiology.  Chest imaging was significant for interstitial edema.  Working diagnosis is acute hypoxic respiratory failure secondary to concomitant COPD and CHF.  8/12: Patient is much more awake today.  Communicative.  No slurred speech.  Remains on 3 to 4 L nasal cannula.  8/13: Patient seen and examined.  A little bit lethargic but respiratory status appears improved.  Now on 3 L last nasal cannula.  Saturating adequately.   Assessment & Plan:   Active Problems:   Acute hypoxemic respiratory failure (HCC)  Acute hypoxemic respiratory failure Mild acute on chronic diastolic congestive heart failure Suspected exacerbation of COPD Patient presented with symptoms consistent with acute decompensated heart failure and concomitant COPD Improving over interval.  Patient did not appear markedly hypervolemic.   Believe that majority of symptoms driven by underlying COPD. 2d echo 01/01/20: normal EF.  Grade 2 diastolic dysfunction Plan: Continue bronchodilator therapy Continue p.o. prednisone 40 mg daily x5 days (day 2) Wean oxygen as tolerated.  Patient did not have a home required Continue p.o. Levaquin Hold Lasix due to AKI Hold lisinopril due to AKI Respiratory therapy following  Acute kidney injury, improving Suspect prerenal azotemia in the setting of diuresis Hold Lasix and lisinopril for now Avoid nephrotoxins Recheck creatinine in the morning  Coronary artery disease History of CVA Continue dual antiplatelet therapy  Hypertension Lisinopril on hold as above Continue home diltiazem As needed hydralazine  Suspected malnutrition RD consult Continue Megace  Hyperlipidemia Continue home Lipitor  Depression Continue Lexapro    DVT prophylaxis: Lovenox Code Status: Full Family Communication:  daughter [name] via phone [phone] on 8/12 Disposition Plan: Status is: Inpatient  Remains inpatient appropriate because:Inpatient level of care appropriate due to severity of illness   Dispo: The patient is from: Home              Anticipated d/c is to: Home with home health              Anticipated d/c date is: 2 days              Patient currently is not medically stable to d/c.   Still with acute hypoxic respiratory failure presumed secondary to decompensated COPD.  Anticipate 24 to 48 hours more of inpatient monitoring and treatment prior to disposition planning.  If we are we able to wean entirely from supplemental oxygen and respiratory status  returned to baseline we can discharge    Consultants:   None  Procedures:   None  Antimicrobials:   Levaquin   Subjective: Seen and examined.  Complains of some cough today.  Objective: Vitals:   02/26/20 0622 02/26/20 0730 02/26/20 0733 02/26/20 0758  BP: 130/68   (!) 101/51  Pulse: 94   97  Resp: 20    17  Temp: 98.6 F (37 C)   98.2 F (36.8 C)  TempSrc: Oral     SpO2: 97% 96% 96% 93%  Weight: 83.9 kg     Height:        Intake/Output Summary (Last 24 hours) at 02/26/2020 1002 Last data filed at 02/26/2020 0500 Gross per 24 hour  Intake --  Output 300 ml  Net -300 ml   Filed Weights   02/25/2020 0846 02/14/2020 1905 02/26/20 0622  Weight: 68 kg 83.4 kg 83.9 kg    Examination:  General: No acute distress.  Patient appears chronically ill HEENT: Normocephalic, atraumatic, poor dentition Neck, supple, trachea midline, no tenderness Heart: Regular rate and rhythm, S1/S2 normal, no murmurs Lungs: Bibasilar crackles.  Normal work of breathing.  3 L nasal cannula Abdomen: Soft, nontender, nondistended, positive bowel sounds Extremities: Left side hemiparesis.  Decreased muscle tone Skin: No rashes or lesions, normal color Neurologic: Left hemiparesis.  Sensation intact.  Alert and oriented x3 Psychiatric: Flattened affect      Data Reviewed: I have personally reviewed following labs and imaging studies  CBC: Recent Labs  Lab 02/23/2020 0827 02/25/20 0421 02/26/20 0417  WBC 10.6* 10.8* 20.1*  NEUTROABS  --   --  17.9*  HGB 13.4 13.6 13.1  HCT 42.0 41.5 40.2  MCV 86.8 86.1 87.2  PLT 176 170 034   Basic Metabolic Panel: Recent Labs  Lab 03/05/2020 0827 02/25/20 0421 02/26/20 0417  NA 137 138 133*  K 4.2 4.5 3.9  CL 106 103 101  CO2 22 22 20*  GLUCOSE 88 128* 117*  BUN 17 23* 44*  CREATININE 0.76 1.47* 1.36*  CALCIUM 10.2 10.9* 11.0*   GFR: Estimated Creatinine Clearance: 49.9 mL/min (A) (by C-G formula based on SCr of 1.36 mg/dL (H)). Liver Function Tests: Recent Labs  Lab 02/21/2020 0827  AST 16  ALT 33  ALKPHOS 62  BILITOT 0.8  PROT 7.2  ALBUMIN 3.7   Recent Labs  Lab 02/14/2020 0827  LIPASE 24   No results for input(s): AMMONIA in the last 168 hours. Coagulation Profile: No results for input(s): INR, PROTIME in the last 168 hours. Cardiac  Enzymes: No results for input(s): CKTOTAL, CKMB, CKMBINDEX, TROPONINI in the last 168 hours. BNP (last 3 results) No results for input(s): PROBNP in the last 8760 hours. HbA1C: No results for input(s): HGBA1C in the last 72 hours. CBG: No results for input(s): GLUCAP in the last 168 hours. Lipid Profile: No results for input(s): CHOL, HDL, LDLCALC, TRIG, CHOLHDL, LDLDIRECT in the last 72 hours. Thyroid Function Tests: No results for input(s): TSH, T4TOTAL, FREET4, T3FREE, THYROIDAB in the last 72 hours. Anemia Panel: No results for input(s): VITAMINB12, FOLATE, FERRITIN, TIBC, IRON, RETICCTPCT in the last 72 hours. Sepsis Labs: No results for input(s): PROCALCITON, LATICACIDVEN in the last 168 hours.  Recent Results (from the past 240 hour(s))  SARS Coronavirus 2 by RT PCR (hospital order, performed in [facility] hospital lab) Nasopharyngeal Nasopharyngeal Swab     Status: None   Collection Time: 02/17/2020  1:22 PM   Specimen: Nasopharyngeal Swab  Result Value Ref Range Status   SARS Coronavirus 2 NEGATIVE NEGATIVE Final    Comment: (NOTE) SARS-CoV-2 target nucleic acids are NOT DETECTED.  The SARS-CoV-2 RNA is generally detectable in upper and lower respiratory specimens during the acute phase of infection. The lowest concentration of SARS-CoV-2 viral copies this assay can detect is 250 copies / mL. A negative result does not preclude SARS-CoV-2 infection and should not be used as the sole basis for treatment or other patient management decisions.  A negative result may occur with improper specimen collection / handling, submission of specimen other than nasopharyngeal swab, presence of viral mutation(s) within the areas targeted by this assay, and inadequate number of viral copies (<250 copies / mL). A negative result must be combined with clinical observations, patient history, and epidemiological information.  Fact Sheet for Patients:    StrictlyIdeas.no  Fact Sheet for Healthcare Providers: BankingDealers.co.za  This test is not yet approved or  cleared by the [place] FDA and has been authorized for detection and/or diagnosis of SARS-CoV-2 by FDA under an Emergency Use Authorization (EUA).  This EUA will remain in effect (meaning this test can be used) for the duration of the COVID-19 declaration under Section 564(b)(1) of the Act, 21 U.S.C. section 360bbb-3(b)(1), unless the authorization is terminated or revoked sooner.  Performed at [facility], [address], [place], [place] [address]          Radiology Studies: CT ABDOMEN PELVIS W CONTRAST  Result Date: 02/25/2020 CLINICAL DATA:  General abdominal pain. EXAM: CT ABDOMEN AND PELVIS WITH CONTRAST TECHNIQUE: Multidetector CT imaging of the abdomen and pelvis was performed using the standard protocol following bolus administration of intravenous contrast. CONTRAST:  178mL OMNIPAQUE IOHEXOL 300 MG/ML  SOLN COMPARISON:  CT chest 05/22/2019 FINDINGS: Lower chest: Bibasilar interstitial opacities and trace effusions. Probable right greater than left atelectasis. Hepatobiliary: No focal liver abnormality is seen. No gallstones, gallbladder wall thickening, or biliary dilatation. Pancreas: Unremarkable. No pancreatic ductal dilatation or surrounding inflammatory changes. Spleen: Normal in size without focal abnormality. Adrenals/Urinary Tract: Stable left 3.2 cm adrenal gland nodule. Small hypodense nodule in the inferior right kidney measuring 0.8 cm, too small to fully characterize. No hydronephrosis. No renal calculi. Urinary bladder unremarkable. Stomach/Bowel: Stomach is within normal limits. Appendix appears normal. No evidence of bowel wall thickening, distention, or inflammatory changes. Colonic diverticula without evidence of diverticulitis. Vascular/Lymphatic: Aortic atherosclerosis. No enlarged  abdominal or pelvic lymph nodes. Reproductive: Uterus and bilateral adnexa are unremarkable. Other: No abdominal wall abnormality.  No abdominopelvic ascites. Musculoskeletal: Chronic severe wedge compression deformity at L1. IMPRESSION: 1. No acute intra-abdominal or intrapelvic process. 2. Bibasilar interstitial opacities and trace effusions, possibly reflecting pulmonary edema, infection not excluded. 3. Colonic diverticulosis without evidence of diverticulitis. 4. Aortic atherosclerosis. Aortic Atherosclerosis (ICD10-I70.0). Electronically Signed   By: Audie Pinto M.D.   On: 03/10/2020 14:51   DG Chest Portable 1 View  Result Date: 02/20/2020 CLINICAL DATA:  Shortness of breath EXAM: PORTABLE CHEST 1 VIEW COMPARISON:  01/11/2020 FINDINGS: Interstitial prominence. Probable mild atelectasis at the lung bases. No pleural effusion. No pneumothorax. Normal heart size. IMPRESSION: Interstitial prominence is likely chronic with possible superimposed mild edema. Electronically Signed   By: Macy Mis M.D.   On: 02/16/2020 14:04        Scheduled Meds: . arformoterol  15 mcg Nebulization BID  . aspirin EC  325 mg Oral Daily  . atorvastatin  40 mg Oral Daily  . budesonide (PULMICORT)  nebulizer solution  0.25 mg Nebulization BID  . clopidogrel  75 mg Oral Daily  . diltiazem  30 mg Oral Q6H  . enoxaparin (LOVENOX) injection  40 mg Subcutaneous Q24H  . escitalopram  10 mg Oral Daily  . feeding supplement (ENSURE ENLIVE)  237 mL Oral TID BM  . folic acid  1 mg Oral Daily  . ipratropium-albuterol  3 mL Nebulization BID  . megestrol  400 mg Oral BID  . multivitamin with minerals  1 tablet Oral Daily  . predniSONE  40 mg Oral Q breakfast  . thiamine  100 mg Oral Daily   Continuous Infusions: . cefTRIAXone (ROCEPHIN)  IV    . doxycycline (VIBRAMYCIN) IV       LOS: 2 days    Time spent: 25 minutes    Sidney Ace, MD [facility] Pager [phone]  If 7PM-7AM,  please contact night-coverage 02/26/2020, 10:02 AM

## 2020-02-27 ENCOUNTER — Inpatient Hospital Stay: Payer: Medicaid Other

## 2020-02-27 LAB — CBC WITH DIFFERENTIAL/PLATELET
Abs Immature Granulocytes: 0.15 10*3/uL — ABNORMAL HIGH (ref 0.00–0.07)
Basophils Absolute: 0 10*3/uL (ref 0.0–0.1)
Basophils Relative: 0 %
Eosinophils Absolute: 0 10*3/uL (ref 0.0–0.5)
Eosinophils Relative: 0 %
HCT: 43.1 % (ref 36.0–46.0)
Hemoglobin: 14.5 g/dL (ref 12.0–15.0)
Immature Granulocytes: 1 %
Lymphocytes Relative: 10 %
Lymphs Abs: 1.9 10*3/uL (ref 0.7–4.0)
MCH: 28.2 pg (ref 26.0–34.0)
MCHC: 33.6 g/dL (ref 30.0–36.0)
MCV: 83.7 fL (ref 80.0–100.0)
Monocytes Absolute: 1 10*3/uL (ref 0.1–1.0)
Monocytes Relative: 5 %
Neutro Abs: 15.1 10*3/uL — ABNORMAL HIGH (ref 1.7–7.7)
Neutrophils Relative %: 84 %
Platelets: 253 10*3/uL (ref 150–400)
RBC: 5.15 MIL/uL — ABNORMAL HIGH (ref 3.87–5.11)
RDW: 13.1 % (ref 11.5–15.5)
WBC: 18.1 10*3/uL — ABNORMAL HIGH (ref 4.0–10.5)
nRBC: 0 % (ref 0.0–0.2)

## 2020-02-27 LAB — BASIC METABOLIC PANEL
Anion gap: 12 (ref 5–15)
BUN: 40 mg/dL — ABNORMAL HIGH (ref 6–20)
CO2: 23 mmol/L (ref 22–32)
Calcium: 11.4 mg/dL — ABNORMAL HIGH (ref 8.9–10.3)
Chloride: 103 mmol/L (ref 98–111)
Creatinine, Ser: 0.86 mg/dL (ref 0.44–1.00)
GFR calc Af Amer: >60 mL/min (ref >60)
GFR calc non Af Amer: >60 mL/min (ref >60)
Glucose, Bld: 121 mg/dL — ABNORMAL HIGH (ref 70–99)
Potassium: 4.3 mmol/L (ref 3.5–5.1)
Sodium: 138 mmol/L (ref 135–145)

## 2020-02-27 LAB — BLOOD GAS, ARTERIAL
Acid-Base Excess: 2.3 mmol/L — ABNORMAL HIGH (ref 0.0–2.0)
Allens test (pass/fail): POSITIVE — AB
Bicarbonate: 25.1 mmol/L (ref 20.0–28.0)
FIO2: 0.32
O2 Saturation: 94.3 %
Patient temperature: 37
pCO2 arterial: 33 mmHg (ref 32.0–48.0)
pH, Arterial: 7.49 — ABNORMAL HIGH (ref 7.350–7.450)
pO2, Arterial: 66 mmHg — ABNORMAL LOW (ref 83.0–108.0)

## 2020-02-27 MED ORDER — LISINOPRIL 5 MG PO TABS
5.0000 mg | ORAL_TABLET | Freq: Every day | ORAL | Status: DC
  Administered 2020-02-27 – 2020-03-02 (×4): 5 mg via ORAL
  Filled 2020-02-27 (×5): qty 1

## 2020-02-27 MED ORDER — PROMETHAZINE HCL 25 MG/ML IJ SOLN
12.5000 mg | Freq: Four times a day (QID) | INTRAMUSCULAR | Status: DC | PRN
  Administered 2020-02-27 (×2): 12.5 mg via INTRAVENOUS
  Filled 2020-02-27 (×2): qty 1

## 2020-02-27 NOTE — Progress Notes (Signed)
PROGRESS NOTE    Anne [name]  WNU:[id number] DOB: Mar 11, 1961 DOA: 02/26/2020 PCP: Donnie Coffin, MD    Brief Narrative: HPI: Anne [name] is a 59 y.o. female with medical history significant of CVA, hypertension, coronary artery disease presents the emergency department for evaluation of abdominal pain associated with weakness and cough.  The patient is post CVA thus little difficult to understand this majority the history is gained by speaking with the ED physician in reading the associated documentation.  Patient reports she is here because she felt weak and more short of breath throughout the day.  No other obvious infectious contacts.  No known sick contacts.  No history of fevers.  No other associated symptoms.  Unable to fully characterize pain.  On my evaluation patient is resting in bed.  She appears chronically ill.  Is difficult to communicate with her secondary to CVA.  However she does understand and nods head yes or no to interview questions.  For the abdominal pain etiology is unclear.  She had a CT abdomen done in the emergency department that did not reveal any acute etiology.  Chest imaging was significant for interstitial edema.  Working diagnosis is acute hypoxic respiratory failure secondary to concomitant COPD and CHF.  8/12: Patient is much more awake today.  Communicative.  No slurred speech.  Remains on 3 to 4 L nasal cannula.  8/13: Patient seen and examined.  A little bit lethargic but respiratory status appears improved.  Now on 3 L last nasal cannula.  Saturating adequately.  8/14: Patient seen and examined.  More lethargic this morning.  No change in saturations.  Continues to require 3 L nasal cannula.  Breath sounds improving.  Per bedside RN patient was unable to sleep well last night.   Assessment & Plan:   Active Problems:   Acute hypoxemic respiratory failure (HCC)  Acute hypoxemic respiratory failure Mild acute on chronic diastolic congestive heart  failure Suspected exacerbation of COPD Patient presented with symptoms consistent with acute decompensated heart failure and concomitant COPD Improving over interval.  Patient did not appear markedly hypervolemic.  Believe that majority of symptoms driven by underlying COPD. 2d echo 01/01/20: normal EF.  Grade 2 diastolic dysfunction Plan: Continue bronchodilator therapy Continue p.o. prednisone 40 mg daily x5 days (day 3) Wean oxygen as tolerated.  Patient did not have a home requirement Continue Levaquin Hold Lasix Restart home lisinopril Respiratory therapy following Target saturation 88 to 92%  Acute kidney injury, resolved Suspect prerenal azotemia in the setting of diuresis Lasix on hold Lisinopril restarted Avoid nephrotoxins Recheck creatinine in the morning  Coronary artery disease History of CVA Continue dual antiplatelet therapy  Hypertension Lisinopril restarted Continue home diltiazem As needed hydralazine  Suspected malnutrition RD consult Continue Megace  Hyperlipidemia Continue home Lipitor  Depression Continue Lexapro    DVT prophylaxis: Lovenox Code Status: Full Family Communication:  daughter [name] via phone [phone] on 8/14 Disposition Plan: Status is: Inpatient  Remains inpatient appropriate because:Inpatient level of care appropriate due to severity of illness   Dispo: The patient is from: Home              Anticipated d/c is to: Home with home health              Anticipated d/c date is: 2 days              Patient currently is not medically stable to d/c.   Still with acute hypoxic respiratory failure secondary  to decompensated COPD.  Also a little bit lethargic this morning.  Unclear etiology.  Attempt to wean entirely from supplemental oxygen.  Target saturation 88 to 92%.    Consultants:   None  Procedures:   None  Antimicrobials:   Levaquin   Subjective: Seen and examined.  Lethargic today.  Did not  participate in interview.  Objective: Vitals:   02/26/20 2343 02/27/20 0500 02/27/20 0541 02/27/20 0854  BP: (!) 159/87  (!) 182/84 (!) 162/76  Pulse: (!) 104  (!) 102 (!) 102  Resp: 18  18 17   Temp: 98.4 F (36.9 C)  98.2 F (36.8 C) 98.2 F (36.8 C)  TempSrc: Oral  Oral   SpO2: 91%  99% 97%  Weight:  82.3 kg    Height:        Intake/Output Summary (Last 24 hours) at 02/27/2020 1008 Last data filed at 02/27/2020 0957 Gross per 24 hour  Intake 600 ml  Output 1100 ml  Net -500 ml   Filed Weights   03/03/2020 1905 02/26/20 0622 02/27/20 0500  Weight: 83.4 kg 83.9 kg 82.3 kg    Examination:  General: No acute distress.  Patient appears chronically ill.  Lethargic HEENT: Normocephalic, atraumatic, poor dentition Neck, supple, trachea midline, no tenderness Heart: Tachycardic, regular rhythm, no murmurs Lungs: Bibasilar crackles.  Normal work of breathing.  3 L nasal cannula Abdomen: Soft, nontender, nondistended, positive bowel sounds Extremities: Left side hemiparesis.  Decreased muscle tone Skin: No rashes or lesions, normal color Neurologic: Left hemiparesis.  Sensation intact.  Unable to assess orientation Psychiatric: Lethargic.  Unable to assess     Data Reviewed: I have personally reviewed following labs and imaging studies  CBC: Recent Labs  Lab 02/14/2020 0827 02/25/20 0421 02/26/20 0417 02/27/20 0804  WBC 10.6* 10.8* 20.1* 18.1*  NEUTROABS  --   --  17.9* 15.1*  HGB 13.4 13.6 13.1 14.5  HCT 42.0 41.5 40.2 43.1  MCV 86.8 86.1 87.2 83.7  PLT 176 170 200 212   Basic Metabolic Panel: Recent Labs  Lab 02/15/2020 0827 02/25/20 0421 02/26/20 0417 02/27/20 0804  NA 137 138 133* 138  K 4.2 4.5 3.9 4.3  CL 106 103 101 103  CO2 22 22 20* 23  GLUCOSE 88 128* 117* 121*  BUN 17 23* 44* 40*  CREATININE 0.76 1.47* 1.36* 0.86  CALCIUM 10.2 10.9* 11.0* 11.4*   GFR: Estimated Creatinine Clearance: 78.3 mL/min (by C-G formula based on SCr of 0.86  mg/dL). Liver Function Tests: Recent Labs  Lab 03/10/2020 0827  AST 16  ALT 33  ALKPHOS 62  BILITOT 0.8  PROT 7.2  ALBUMIN 3.7   Recent Labs  Lab 03/06/2020 0827  LIPASE 24   No results for input(s): AMMONIA in the last 168 hours. Coagulation Profile: No results for input(s): INR, PROTIME in the last 168 hours. Cardiac Enzymes: No results for input(s): CKTOTAL, CKMB, CKMBINDEX, TROPONINI in the last 168 hours. BNP (last 3 results) No results for input(s): PROBNP in the last 8760 hours. HbA1C: No results for input(s): HGBA1C in the last 72 hours. CBG: No results for input(s): GLUCAP in the last 168 hours. Lipid Profile: No results for input(s): CHOL, HDL, LDLCALC, TRIG, CHOLHDL, LDLDIRECT in the last 72 hours. Thyroid Function Tests: No results for input(s): TSH, T4TOTAL, FREET4, T3FREE, THYROIDAB in the last 72 hours. Anemia Panel: No results for input(s): VITAMINB12, FOLATE, FERRITIN, TIBC, IRON, RETICCTPCT in the last 72 hours. Sepsis Labs: No results for input(s): PROCALCITON,  LATICACIDVEN in the last 168 hours.  Recent Results (from the past 240 hour(s))  SARS Coronavirus 2 by RT PCR (hospital order, performed in [facility] hospital lab) Nasopharyngeal Nasopharyngeal Swab     Status: None   Collection Time: 02/23/2020  1:22 PM   Specimen: Nasopharyngeal Swab  Result Value Ref Range Status   SARS Coronavirus 2 NEGATIVE NEGATIVE Final    Comment: (NOTE) SARS-CoV-2 target nucleic acids are NOT DETECTED.  The SARS-CoV-2 RNA is generally detectable in upper and lower respiratory specimens during the acute phase of infection. The lowest concentration of SARS-CoV-2 viral copies this assay can detect is 250 copies / mL. A negative result does not preclude SARS-CoV-2 infection and should not be used as the sole basis for treatment or other patient management decisions.  A negative result may occur with improper specimen collection / handling, submission of specimen  other than nasopharyngeal swab, presence of viral mutation(s) within the areas targeted by this assay, and inadequate number of viral copies (<250 copies / mL). A negative result must be combined with clinical observations, patient history, and epidemiological information.  Fact Sheet for Patients:   StrictlyIdeas.no  Fact Sheet for Healthcare Providers: BankingDealers.co.za  This test is not yet approved or  cleared by the [place] FDA and has been authorized for detection and/or diagnosis of SARS-CoV-2 by FDA under an Emergency Use Authorization (EUA).  This EUA will remain in effect (meaning this test can be used) for the duration of the COVID-19 declaration under Section 564(b)(1) of the Act, 21 U.S.C. section 360bbb-3(b)(1), unless the authorization is terminated or revoked sooner.  Performed at [facility], [address], [place], [place] [address]          Radiology Studies: No results found.      Scheduled Meds: . arformoterol  15 mcg Nebulization BID  . aspirin EC  325 mg Oral Daily  . atorvastatin  40 mg Oral Daily  . budesonide (PULMICORT) nebulizer solution  0.25 mg Nebulization BID  . clopidogrel  75 mg Oral Daily  . diltiazem  30 mg Oral Q6H  . enoxaparin (LOVENOX) injection  40 mg Subcutaneous Q24H  . escitalopram  10 mg Oral Daily  . feeding supplement (ENSURE ENLIVE)  237 mL Oral TID BM  . folic acid  1 mg Oral Daily  . ipratropium-albuterol  3 mL Nebulization BID  . megestrol  400 mg Oral BID  . multivitamin with minerals  1 tablet Oral Daily  . predniSONE  40 mg Oral Q breakfast  . thiamine  100 mg Oral Daily   Continuous Infusions: . cefTRIAXone (ROCEPHIN)  IV Stopped (02/26/20 1343)  . doxycycline (VIBRAMYCIN) IV Stopped (02/26/20 2301)     LOS: 3 days    Time spent: 25 minutes    Sidney Ace, MD [facility] Pager [phone]  If 7PM-7AM, please  contact night-coverage 02/27/2020, 10:08 AM

## 2020-02-27 NOTE — Progress Notes (Signed)
Pt refuse cardizem. NP aware. PRN hydralazine given for BP 182/84.

## 2020-02-28 LAB — CBC WITH DIFFERENTIAL/PLATELET
Abs Immature Granulocytes: 0.11 10*3/uL — ABNORMAL HIGH (ref 0.00–0.07)
Basophils Absolute: 0 10*3/uL (ref 0.0–0.1)
Basophils Relative: 0 %
Eosinophils Absolute: 0 10*3/uL (ref 0.0–0.5)
Eosinophils Relative: 0 %
HCT: 44.9 % (ref 36.0–46.0)
Hemoglobin: 14.9 g/dL (ref 12.0–15.0)
Immature Granulocytes: 1 %
Lymphocytes Relative: 12 %
Lymphs Abs: 1.9 10*3/uL (ref 0.7–4.0)
MCH: 28.1 pg (ref 26.0–34.0)
MCHC: 33.2 g/dL (ref 30.0–36.0)
MCV: 84.7 fL (ref 80.0–100.0)
Monocytes Absolute: 1.2 10*3/uL — ABNORMAL HIGH (ref 0.1–1.0)
Monocytes Relative: 7 %
Neutro Abs: 13.2 10*3/uL — ABNORMAL HIGH (ref 1.7–7.7)
Neutrophils Relative %: 80 %
Platelets: 257 10*3/uL (ref 150–400)
RBC: 5.3 MIL/uL — ABNORMAL HIGH (ref 3.87–5.11)
RDW: 13.2 % (ref 11.5–15.5)
WBC: 16.4 10*3/uL — ABNORMAL HIGH (ref 4.0–10.5)
nRBC: 0 % (ref 0.0–0.2)

## 2020-02-28 LAB — BASIC METABOLIC PANEL
Anion gap: 12 (ref 5–15)
BUN: 32 mg/dL — ABNORMAL HIGH (ref 6–20)
CO2: 24 mmol/L (ref 22–32)
Calcium: 11.5 mg/dL — ABNORMAL HIGH (ref 8.9–10.3)
Chloride: 107 mmol/L (ref 98–111)
Creatinine, Ser: 0.74 mg/dL (ref 0.44–1.00)
GFR calc Af Amer: >60 mL/min (ref >60)
GFR calc non Af Amer: >60 mL/min (ref >60)
Glucose, Bld: 110 mg/dL — ABNORMAL HIGH (ref 70–99)
Potassium: 4.2 mmol/L (ref 3.5–5.1)
Sodium: 143 mmol/L (ref 135–145)

## 2020-02-28 LAB — TROPONIN I (HIGH SENSITIVITY)
Troponin I (High Sensitivity): 24 ng/L — ABNORMAL HIGH (ref <18)
Troponin I (High Sensitivity): 31 ng/L — ABNORMAL HIGH (ref <18)

## 2020-02-28 MED ORDER — DILTIAZEM HCL 25 MG/5ML IV SOLN
10.0000 mg | Freq: Once | INTRAVENOUS | Status: AC
  Administered 2020-02-28: 10 mg via INTRAVENOUS
  Filled 2020-02-28: qty 5

## 2020-02-28 MED ORDER — LABETALOL HCL 5 MG/ML IV SOLN
10.0000 mg | INTRAVENOUS | Status: DC | PRN
  Administered 2020-02-28: 20:00:00 10 mg via INTRAVENOUS
  Filled 2020-02-28: qty 4

## 2020-02-28 MED ORDER — SODIUM CHLORIDE 0.9 % IV SOLN
INTRAVENOUS | Status: DC | PRN
  Administered 2020-02-28: 21:00:00 250 mL via INTRAVENOUS

## 2020-02-28 NOTE — Progress Notes (Signed)
   02/28/20 2304  Assess: MEWS Score  BP 137/80  Pulse Rate (!) 104  SpO2 95 %  O2 Device Nasal Cannula  O2 Flow Rate (L/min) 2 L/min  Assess: MEWS Score  MEWS Temp 0  MEWS Systolic 0  MEWS Pulse 1  MEWS RR 0  MEWS LOC 0  MEWS Score 1  MEWS Score Color Green  Assess: if the MEWS score is Yellow or Red  Were vital signs taken at a resting state? Yes  Focused Assessment No change from prior assessment  Early Detection of Sepsis Score *See Row Information* Medium  MEWS guidelines implemented *See Row Information* No, previously red, continue vital signs every 4 hours  Document  Patient Outcome Other (Comment) (Continue to monitor Pt will be Q 4hr vitals x4)

## 2020-02-28 NOTE — Progress Notes (Signed)
Physical Therapy Treatment Patient Details Name: Anne [name] MRN: [id number] DOB: Apr 24, 1961 Today's Date: 02/28/2020    History of Present Illness Anne [name] is a 72yoF who comes to [facility] on 8/11 c RLQ pain. PMH: CAD, asthmas, tobacco abuse, polysubstance abuse,major depressive disorder.  Pt was recently at [facility] in [place] after CVA returned to home on 8/3, whereas paysource complications precluded option of STR/SNF/HHPT. ABD pain cleared, no imaging findings. After further workup, pt admitted c acute hypoxic respiratory failure secondary to concomitant COPD and CHF.    PT Comments    Pt ready for session.  Upon arrival to room asks for juice.  She is able to drink all of her juice and 1/2 for her boost drink with assist during session.  Participated in exercises as described below.  During ex she is noted to be inc urine despite external cath. +2 assist for care rolling left/right.  New pads and gown given.   Follow Up Recommendations  Home health PT;Supervision/Assistance - 24 hour     Equipment Recommendations       Recommendations for Other Services       Precautions / Restrictions Precautions Precautions: Fall Precaution Comments: LUE hemiplegia, Left neglect Restrictions Weight Bearing Restrictions: No    Mobility  Bed Mobility Overal bed mobility: Needs Assistance Bed Mobility: Rolling Rolling: Min assist;Mod assist;+2 for physical assistance            Transfers                    Ambulation/Gait                 Stairs             Wheelchair Mobility    Modified Rankin (Stroke Patients Only)       Balance                                            Cognition Arousal/Alertness: Awake/alert Behavior During Therapy: Flat affect Overall Cognitive Status: Difficult to assess                                        Exercises Other Exercises Other Exercises: supine AA/PROM BLE ankle  pumps, SLR, heel slides, ab/add x 10    General Comments        Pertinent Vitals/Pain Pain Assessment: No/denies pain    Home Living                      Prior Function            PT Goals (current goals can now be found in the care plan section) Progress towards PT goals: Progressing toward goals    Frequency    Min 2X/week      PT Plan Current plan remains appropriate    Co-evaluation              AM-PAC PT "6 Clicks" Mobility   Outcome Measure  Help needed turning from your back to your side while in a flat bed without using bedrails?: Total Help needed moving from lying on your back to sitting on the side of a flat bed without using bedrails?: Total Help needed moving to and from a bed to a chair (including  a wheelchair)?: Total Help needed standing up from a chair using your arms (e.g., wheelchair or bedside chair)?: Total Help needed to walk in hospital room?: Total Help needed climbing 3-5 steps with a railing? : Total 6 Click Score: 6    End of Session   Activity Tolerance: Patient tolerated treatment well Patient left: in bed;with call bell/phone within reach;with bed alarm set Nurse Communication: Mobility status       Time: 1164-3539 PT Time Calculation (min) (ACUTE ONLY): 23 min  Charges:  $Therapeutic Exercise: 8-22 mins $Therapeutic Activity: 8-22 mins                    Chesley Noon, PTA 02/28/20, 12:49 PM

## 2020-02-28 NOTE — Progress Notes (Signed)
PROGRESS NOTE    Anne [name]  ALP:[id number] DOB: [birth year] DOA: 03/09/2020 PCP: Donnie Coffin, MD    Brief Narrative: HPI: Anne [name] is a 59 y.o. female with medical history significant of CVA, hypertension, coronary artery disease presents the emergency department for evaluation of abdominal pain associated with weakness and cough.  The patient is post CVA thus little difficult to understand this majority the history is gained by speaking with the ED physician in reading the associated documentation.  Patient reports she is here because she felt weak and more short of breath throughout the day.  No other obvious infectious contacts.  No known sick contacts.  No history of fevers.  No other associated symptoms.  Unable to fully characterize pain.  On my evaluation patient is resting in bed.  She appears chronically ill.  Is difficult to communicate with her secondary to CVA.  However she does understand and nods head yes or no to interview questions.  For the abdominal pain etiology is unclear.  She had a CT abdomen done in the emergency department that did not reveal any acute etiology.  Chest imaging was significant for interstitial edema.  Working diagnosis is acute hypoxic respiratory failure secondary to concomitant COPD and CHF.  8/12: Patient is much more awake today.  Communicative.  No slurred speech.  Remains on 3 to 4 L nasal cannula.  8/13: Patient seen and examined.  A little bit lethargic but respiratory status appears improved.  Now on 3 L last nasal cannula.  Saturating adequately.  8/14: Patient seen and examined.  More lethargic this morning.  No change in saturations.  Continues to require 3 L nasal cannula.  Breath sounds improving.  Per bedside RN patient was unable to sleep well last night.  8/15: Patient seen and examined.  Remains very lethargic.  Oxygen status actually improving.  Titrated down to 2 L nasal cannula.  Breath sounds also improving.  Patient seems  almost catatonic.  She does open eyes but very limited participation in interview.  I related that her oral intake has been very poor and that if she continues to not eat food after consider a Dobbhoff and initiation of tube feeds.  I also asked her about depressive symptoms which she did endorse.  I have called consultation with psychiatry.  Assessment & Plan:   Active Problems:   Acute hypoxemic respiratory failure (HCC)  Acute hypoxemic respiratory failure Mild acute on chronic diastolic congestive heart failure Suspected exacerbation of COPD Patient presented with symptoms consistent with acute decompensated heart failure and concomitant COPD Improving over interval.  Patient did not appear markedly hypervolemic.  Believe that majority of symptoms driven by underlying COPD. 2d echo 01/01/20: normal EF.  Grade 2 diastolic dysfunction Plan: Continue bronchodilator therapy Continue p.o. prednisone 40 mg daily x5 days (day 4) Wean oxygen as tolerated.  Patient did not have a home requirement Currently down to 2 L nasal cannula Continue Levaquin Hold Lasix Continue home lisinopril Respiratory therapy following Target saturation 88 to 92%  Depression Continue Lexapro Patient appears very depressed.  She is lying in bed constantly.  She does not participate in interview.  She is not eating any food.  I relate that with continued poor p.o. intake we would likely have to place a Dobbhoff and initiate tube feeds.  She does not give me response to this.  She does endorse depression when asked repeatedly.  I have called consultation with psychiatry.  Recommendations appreciated.  Acute kidney  injury, resolved Suspect prerenal azotemia in the setting of diuresis Lasix on hold Lisinopril restarted Avoid nephrotoxins  Coronary artery disease History of CVA Continue dual antiplatelet therapy  Hypertension Lisinopril restarted Continue home diltiazem As needed hydralazine  Suspected  malnutrition RD consult Continue Megace  Hyperlipidemia Continue home Lipitor  DVT prophylaxis: Lovenox Code Status: Full Family Communication:  daughter [name] via phone [phone] on 8/15 Disposition Plan: Status is: Inpatient  Remains inpatient appropriate because:Inpatient level of care appropriate due to severity of illness   Dispo: The patient is from: Home              Anticipated d/c is to: Home with home health              Anticipated d/c date is: 2 days              Patient currently is not medically stable to d/c.   Patient has improved substantially from a respiratory standpoint.  Metabolic profile largely within normal limits.  However the patient is very depressed and lethargic.  Seems very withdrawn.  Not eating.  Intermittently refusing medications.  Etiology of this is unclear.  No clearly identifiable metabolic physiologic causes.  Suspect psychiatric component.  I called consultation with psychiatry.  Recommendations greatly appreciated.  Consultants:   None  Procedures:   None  Antimicrobials:   Levaquin   Subjective: Seen and examined.  Lethargic today.  Did not participate in interview.  Objective: Vitals:   02/28/20 0048 02/28/20 0500 02/28/20 0505 02/28/20 0800  BP: (!) 180/84  (!) 187/89 (!) 160/117  Pulse: (!) 109  (!) 107   Resp: 16  16   Temp: 98.8 F (37.1 C)  98.5 F (36.9 C) 98.8 F (37.1 C)  TempSrc: Oral  Oral Oral  SpO2: 99%  99%   Weight:  82.1 kg    Height:        Intake/Output Summary (Last 24 hours) at 02/28/2020 1043 Last data filed at 02/28/2020 0635 Gross per 24 hour  Intake --  Output 1000 ml  Net -1000 ml   Filed Weights   02/26/20 0622 02/27/20 0500 02/28/20 0500  Weight: 83.9 kg 82.3 kg 82.1 kg    Examination:  General: No acute distress.  Very withdrawn.  Chronically ill HEENT: Normocephalic, atraumatic, poor dentition Neck, supple, trachea midline, no tenderness Heart: Tachycardic, regular  rhythm, no murmurs Lungs: Bibasilar crackles.  Normal work of breathing.  2 L nasal cannula Abdomen: Soft, nontender, nondistended, positive bowel sounds Extremities: Left side hemiparesis.  Decreased muscle tone Skin: No rashes or lesions, normal color Neurologic: Left hemiparesis.  Sensation intact.  Unable to assess orientation Psychiatric: Depressed, withdrawn     Data Reviewed: I have personally reviewed following labs and imaging studies  CBC: Recent Labs  Lab 03/03/2020 0827 02/25/20 0421 02/26/20 0417 02/27/20 0804 02/28/20 0603  WBC 10.6* 10.8* 20.1* 18.1* 16.4*  NEUTROABS  --   --  17.9* 15.1* 13.2*  HGB 13.4 13.6 13.1 14.5 14.9  HCT 42.0 41.5 40.2 43.1 44.9  MCV 86.8 86.1 87.2 83.7 84.7  PLT 176 170 200 253 500   Basic Metabolic Panel: Recent Labs  Lab 03/05/2020 0827 02/25/20 0421 02/26/20 0417 02/27/20 0804 02/28/20 0603  NA 137 138 133* 138 143  K 4.2 4.5 3.9 4.3 4.2  CL 106 103 101 103 107  CO2 22 22 20* 23 24  GLUCOSE 88 128* 117* 121* 110*  BUN 17 23* 44* 40* 32*  CREATININE  0.76 1.47* 1.36* 0.86 0.74  CALCIUM 10.2 10.9* 11.0* 11.4* 11.5*   GFR: Estimated Creatinine Clearance: 84.1 mL/min (by C-G formula based on SCr of 0.74 mg/dL). Liver Function Tests: Recent Labs  Lab 02/23/2020 0827  AST 16  ALT 33  ALKPHOS 62  BILITOT 0.8  PROT 7.2  ALBUMIN 3.7   Recent Labs  Lab 02/23/2020 0827  LIPASE 24   No results for input(s): AMMONIA in the last 168 hours. Coagulation Profile: No results for input(s): INR, PROTIME in the last 168 hours. Cardiac Enzymes: No results for input(s): CKTOTAL, CKMB, CKMBINDEX, TROPONINI in the last 168 hours. BNP (last 3 results) No results for input(s): PROBNP in the last 8760 hours. HbA1C: No results for input(s): HGBA1C in the last 72 hours. CBG: No results for input(s): GLUCAP in the last 168 hours. Lipid Profile: No results for input(s): CHOL, HDL, LDLCALC, TRIG, CHOLHDL, LDLDIRECT in the last 72  hours. Thyroid Function Tests: No results for input(s): TSH, T4TOTAL, FREET4, T3FREE, THYROIDAB in the last 72 hours. Anemia Panel: No results for input(s): VITAMINB12, FOLATE, FERRITIN, TIBC, IRON, RETICCTPCT in the last 72 hours. Sepsis Labs: No results for input(s): PROCALCITON, LATICACIDVEN in the last 168 hours.  Recent Results (from the past 240 hour(s))  SARS Coronavirus 2 by RT PCR (hospital order, performed in [facility] hospital lab) Nasopharyngeal Nasopharyngeal Swab     Status: None   Collection Time: 03/13/2020  1:22 PM   Specimen: Nasopharyngeal Swab  Result Value Ref Range Status   SARS Coronavirus 2 NEGATIVE NEGATIVE Final    Comment: (NOTE) SARS-CoV-2 target nucleic acids are NOT DETECTED.  The SARS-CoV-2 RNA is generally detectable in upper and lower respiratory specimens during the acute phase of infection. The lowest concentration of SARS-CoV-2 viral copies this assay can detect is 250 copies / mL. A negative result does not preclude SARS-CoV-2 infection and should not be used as the sole basis for treatment or other patient management decisions.  A negative result may occur with improper specimen collection / handling, submission of specimen other than nasopharyngeal swab, presence of viral mutation(s) within the areas targeted by this assay, and inadequate number of viral copies (<250 copies / mL). A negative result must be combined with clinical observations, patient history, and epidemiological information.  Fact Sheet for Patients:   StrictlyIdeas.no  Fact Sheet for Healthcare Providers: BankingDealers.co.za  This test is not yet approved or  cleared by the [place] FDA and has been authorized for detection and/or diagnosis of SARS-CoV-2 by FDA under an Emergency Use Authorization (EUA).  This EUA will remain in effect (meaning this test can be used) for the duration of the COVID-19 declaration under  Section 564(b)(1) of the Act, 21 U.S.C. section 360bbb-3(b)(1), unless the authorization is terminated or revoked sooner.  Performed at [facility], [address] [place], [place] [address]          Radiology Studies: CT HEAD WO CONTRAST  Result Date: 02/27/2020 CLINICAL DATA:  Altered mental status. EXAM: CT HEAD WITHOUT CONTRAST TECHNIQUE: Contiguous axial images were obtained from the base of the skull through the vertex without intravenous contrast. COMPARISON:  December 31, 2019 FINDINGS: Brain: There is mild cerebral atrophy with widening of the extra-axial spaces and ventricular dilatation. There are areas of decreased attenuation within the white matter tracts of the supratentorial brain, consistent with microvascular disease changes. A chronic left basal ganglia lacunar infarct is noted. Small areas of cortical encephalomalacia, with adjacent chronic white matter  low attenuation, are seen within the right parietal lobe. Vascular: No hyperdense vessel or unexpected calcification. Skull: Normal. Negative for fracture or focal lesion. Sinuses/Orbits: No acute finding. Other: None. IMPRESSION: 1. Generalized cerebral atrophy. 2. Chronic left basal ganglia lacunar infarct. 3. Small areas of cortical encephalomalacia, with adjacent chronic white matter low attenuation, are seen within the right parietal lobe which may represent sequelae associated with a chronic infarct. 4. No acute intracranial abnormality. Electronically Signed   By: Virgina Norfolk M.D.   On: 02/27/2020 18:44        Scheduled Meds: . arformoterol  15 mcg Nebulization BID  . aspirin EC  325 mg Oral Daily  . atorvastatin  40 mg Oral Daily  . budesonide (PULMICORT) nebulizer solution  0.25 mg Nebulization BID  . clopidogrel  75 mg Oral Daily  . diltiazem  30 mg Oral Q6H  . enoxaparin (LOVENOX) injection  40 mg Subcutaneous Q24H  . escitalopram  10 mg Oral Daily  . feeding supplement (ENSURE ENLIVE)  237  mL Oral TID BM  . folic acid  1 mg Oral Daily  . ipratropium-albuterol  3 mL Nebulization BID  . lisinopril  5 mg Oral Daily  . megestrol  400 mg Oral BID  . multivitamin with minerals  1 tablet Oral Daily  . thiamine  100 mg Oral Daily   Continuous Infusions: . cefTRIAXone (ROCEPHIN)  IV 1 g (02/28/20 1025)  . doxycycline (VIBRAMYCIN) IV 100 mg (02/27/20 2349)     LOS: 4 days    Time spent: 25 minutes    Sidney Ace, MD [facility] Pager [phone]  If 7PM-7AM, please contact night-coverage 02/28/2020, 10:43 AM

## 2020-02-28 NOTE — Progress Notes (Signed)
Pt at current has returned to green mews. Pt refusing PO meds d/t nausea. Med refused include cardizem. NP made aware of refusal of cardizem with first mews 2 hr follow up. No new orders at this time. Continuing to monitor pt in according to mews protocol.    02/27/20 2041  Assess: MEWS Score  Temp 98.8 F (37.1 C)  BP (!) 166/94  Pulse Rate (!) 127  Resp 16  Level of Consciousness Alert  SpO2 97 %  O2 Device Nasal Cannula  O2 Flow Rate (L/min) 3 L/min  Assess: MEWS Score  MEWS Temp 0  MEWS Systolic 0  MEWS Pulse 2  MEWS RR 0  MEWS LOC 0  MEWS Score 2  MEWS Score Color Yellow  Assess: if the MEWS score is Yellow or Red  Were vital signs taken at a resting state? Yes  Focused Assessment No change from prior assessment (see shift assessment)  Early Detection of Sepsis Score *See Row Information* Medium  MEWS guidelines implemented *See Row Information* Yes  Treat  MEWS Interventions Other (Comment) (continue to monitor)  Take Vital Signs  Increase Vital Sign Frequency  Yellow: Q 2hr X 2 then Q 4hr X 2, if remains yellow, continue Q 4hrs  Escalate  MEWS: Escalate Yellow: discuss with charge nurse/RN and consider discussing with provider and RRT  Notify: Charge Nurse/RN  Name of Charge Nurse/RN Notified Kennyth Lose Page RN  Date Charge Nurse/RN Notified 02/27/20  Time Charge Nurse/RN Notified [phone]

## 2020-02-28 NOTE — Progress Notes (Signed)
   02/28/20 2051  Assess: MEWS Score  Temp 98.1 F (36.7 C)  BP (!) 167/88  Pulse Rate 89  Resp (!) 22  SpO2 98 %  O2 Device Nasal Cannula  Assess: MEWS Score  MEWS Temp 0  MEWS Systolic 0  MEWS Pulse 0  MEWS RR 1  MEWS LOC 0  MEWS Score 1  MEWS Score Color Green  Assess: if the MEWS score is Yellow or Red  Were vital signs taken at a resting state? Yes  Focused Assessment No change from prior assessment  Early Detection of Sepsis Score *See Row Information* Medium  MEWS guidelines implemented *See Row Information* No, previously red, continue vital signs every 4 hours  Treat  MEWS Interventions Administered scheduled meds/treatments;Administered prn meds/treatments  Pain Scale 0-10  Pain Score 0  Complains of Other (Comment) (elevated HR )  Interventions Medication (see MAR)  Per NP BP needs to be below 160. Will administer PRN hydralazine

## 2020-02-28 NOTE — Progress Notes (Signed)
   02/28/20 2159  Assess: MEWS Score  Temp 98.9 F (37.2 C)  BP (!) 144/71  Pulse Rate 99  Resp 18  SpO2 98 %  O2 Device Nasal Cannula  O2 Flow Rate (L/min) 2 L/min  Assess: MEWS Score  MEWS Temp 0  MEWS Systolic 0  MEWS Pulse 0  MEWS RR 0  MEWS LOC 0  MEWS Score 0  MEWS Score Color Green  Assess: if the MEWS score is Yellow or Red  Were vital signs taken at a resting state? Yes  Focused Assessment No change from prior assessment  Early Detection of Sepsis Score *See Row Information* Medium  MEWS guidelines implemented *See Row Information* No, previously red, continue vital signs every 4 hours  Continue with q 1 hr vital x1

## 2020-02-28 NOTE — Progress Notes (Signed)
   02/28/20 1848  Assess: MEWS Score  Temp 98.2 F (36.8 C)  BP (!) 204/93  Pulse Rate (!) 118  ECG Heart Rate (!) 118  Resp (!) 24  SpO2 94 %  O2 Device Nasal Cannula  O2 Flow Rate (L/min) 2 L/min  Assess: MEWS Score  MEWS Temp 0  MEWS Systolic 2  MEWS Pulse 2  MEWS RR 1  MEWS LOC 0  MEWS Score 5  MEWS Score Color Red  Assess: if the MEWS score is Yellow or Red  Were vital signs taken at a resting state? Yes  Focused Assessment No change from prior assessment  Early Detection of Sepsis Score *See Row Information* Medium  MEWS guidelines implemented *See Row Information* Yes  Treat  MEWS Interventions Administered prn meds/treatments;Escalated (See documentation below)  Take Vital Signs  Increase Vital Sign Frequency  Red: Q 1hr X 4 then Q 4hr X 4, if remains red, continue Q 4hrs  Escalate  MEWS: Escalate Red: discuss with charge nurse/RN and provider, consider discussing with RRT  Notify: Charge Nurse/RN  Name of Charge Nurse/RN Notified Lucious Groves RN  Date Charge Nurse/RN Notified 02/28/20  Time Charge Nurse/RN Notified 1853  Notify: Provider  Provider Name/Title Dr Priscella Mann  Date Provider Notified 02/28/20  Time Provider Notified 1858  Notification Type Page  Notification Reason Change in status  Response See new orders  Document  Patient Outcome Other (Comment) (will continue to monitor after IV BP meds)  Progress note created (see row info) Yes

## 2020-02-28 NOTE — Progress Notes (Signed)
   02/28/20 2122  Assess: MEWS Score  Temp 98.2 F (36.8 C)  BP (!) 149/71  Pulse Rate 96  Resp 19  SpO2 97 %  O2 Device Nasal Cannula  O2 Flow Rate (L/min) 2 L/min  Assess: MEWS Score  MEWS Temp 0  MEWS Systolic 0  MEWS Pulse 0  MEWS RR 0  MEWS LOC 0  MEWS Score 0  MEWS Score Color Green  [name] BP improved

## 2020-02-28 NOTE — Care Management (Signed)
Received secure chart from bedside RN at 1858 the patient was now I read and use.  Reason for mews elevation was hypertensive urgency and tachycardia.  Blood pressure is steadily been rising throughout the day.  Patient intermittently refuses medications due to nausea.  Will administer 10 mg of IV labetalol and 10 mg of IV Cardizem.  As needed hydralazine is also on board.  Will place transfer orders to progressive care unit.  If blood pressure remains markedly elevated despite these interventions can consider transfer to stepdown unit and initiation of Cardene infusion for blood pressure control.  At this time will transfer to PCU for closer monitoring.  Patient remains at high risk for clinical decompensation.  Ralene Muskrat MD

## 2020-02-28 NOTE — Progress Notes (Signed)
   02/28/20 1445  Assess: MEWS Score  BP (!) 175/80  Pulse Rate (!) 125  ECG Heart Rate (!) 126  Resp 20  Level of Consciousness Alert  SpO2 94 %  O2 Device Room Air  O2 Flow Rate (L/min) 2 L/min  Assess: MEWS Score  MEWS Temp 0  MEWS Systolic 0  MEWS Pulse 2  MEWS RR 0  MEWS LOC 0  MEWS Score 2  MEWS Score Color Yellow  Assess: if the MEWS score is Yellow or Red  Were vital signs taken at a resting state? Yes  Focused Assessment No change from prior assessment  Early Detection of Sepsis Score *See Row Information* Medium  MEWS guidelines implemented *See Row Information* Yes  Treat  MEWS Interventions Administered scheduled meds/treatments;Escalated (See documentation below)  Pain Scale 0-10  Pain Score 0  Take Vital Signs  Increase Vital Sign Frequency  Yellow: Q 2hr X 2 then Q 4hr X 2, if remains yellow, continue Q 4hrs  Escalate  MEWS: Escalate Yellow: discuss with charge nurse/RN and consider discussing with provider and RRT  Notify: Charge Nurse/RN  Name of Charge Nurse/RN Notified Malka Sinwany RN  Date Charge Nurse/RN Notified 02/28/20  Time Charge Nurse/RN Notified 1450  Document  Patient Outcome Other (Comment) (continue to monitor HR after medication)  Progress note created (see row info) Yes

## 2020-02-29 LAB — CBC WITH DIFFERENTIAL/PLATELET
Abs Immature Granulocytes: 0.21 10*3/uL — ABNORMAL HIGH (ref 0.00–0.07)
Basophils Absolute: 0 10*3/uL (ref 0.0–0.1)
Basophils Relative: 0 %
Eosinophils Absolute: 0 10*3/uL (ref 0.0–0.5)
Eosinophils Relative: 0 %
HCT: 47.2 % — ABNORMAL HIGH (ref 36.0–46.0)
Hemoglobin: 15.1 g/dL — ABNORMAL HIGH (ref 12.0–15.0)
Immature Granulocytes: 1 %
Lymphocytes Relative: 15 %
Lymphs Abs: 2.8 10*3/uL (ref 0.7–4.0)
MCH: 28 pg (ref 26.0–34.0)
MCHC: 32 g/dL (ref 30.0–36.0)
MCV: 87.4 fL (ref 80.0–100.0)
Monocytes Absolute: 1.5 10*3/uL — ABNORMAL HIGH (ref 0.1–1.0)
Monocytes Relative: 8 %
Neutro Abs: 13.5 10*3/uL — ABNORMAL HIGH (ref 1.7–7.7)
Neutrophils Relative %: 76 %
Platelets: 263 10*3/uL (ref 150–400)
RBC: 5.4 MIL/uL — ABNORMAL HIGH (ref 3.87–5.11)
RDW: 13.5 % (ref 11.5–15.5)
WBC: 18 10*3/uL — ABNORMAL HIGH (ref 4.0–10.5)
nRBC: 0 % (ref 0.0–0.2)

## 2020-02-29 LAB — BASIC METABOLIC PANEL
Anion gap: 13 (ref 5–15)
BUN: 30 mg/dL — ABNORMAL HIGH (ref 6–20)
CO2: 21 mmol/L — ABNORMAL LOW (ref 22–32)
Calcium: 11.5 mg/dL — ABNORMAL HIGH (ref 8.9–10.3)
Chloride: 108 mmol/L (ref 98–111)
Creatinine, Ser: 0.61 mg/dL (ref 0.44–1.00)
GFR calc Af Amer: >60 mL/min (ref >60)
GFR calc non Af Amer: >60 mL/min (ref >60)
Glucose, Bld: 104 mg/dL — ABNORMAL HIGH (ref 70–99)
Potassium: 4 mmol/L (ref 3.5–5.1)
Sodium: 142 mmol/L (ref 135–145)

## 2020-02-29 LAB — TSH: TSH: 0.456 u[IU]/mL (ref 0.350–4.500)

## 2020-02-29 LAB — TROPONIN I (HIGH SENSITIVITY)
Troponin I (High Sensitivity): 24 ng/L — ABNORMAL HIGH (ref <18)
Troponin I (High Sensitivity): 40 ng/L — ABNORMAL HIGH (ref <18)

## 2020-02-29 MED ORDER — LABETALOL HCL 5 MG/ML IV SOLN
10.0000 mg | INTRAVENOUS | Status: DC | PRN
  Administered 2020-02-29 – 2020-03-01 (×3): 10 mg via INTRAVENOUS
  Filled 2020-02-29 (×3): qty 4

## 2020-02-29 MED ORDER — CYPROHEPTADINE HCL 2 MG/5ML PO SYRP
2.0000 mg | ORAL_SOLUTION | Freq: Three times a day (TID) | ORAL | Status: DC
  Administered 2020-02-29 – 2020-03-02 (×3): 2 mg via ORAL
  Filled 2020-02-29 (×8): qty 5

## 2020-02-29 MED ORDER — DILTIAZEM HCL ER COATED BEADS 120 MG PO CP24
120.0000 mg | ORAL_CAPSULE | Freq: Every day | ORAL | Status: DC
  Administered 2020-02-29 – 2020-03-02 (×2): 120 mg via ORAL
  Filled 2020-02-29 (×3): qty 1

## 2020-02-29 MED ORDER — OLANZAPINE 2.5 MG PO TABS
1.2500 mg | ORAL_TABLET | Freq: Every day | ORAL | Status: DC
  Administered 2020-02-29: 22:00:00 1.25 mg via ORAL
  Filled 2020-02-29 (×3): qty 0.5

## 2020-02-29 NOTE — Progress Notes (Signed)
°   02/29/20 0529  Assess: MEWS Score  Temp 98.7 F (37.1 C)  BP (!) 144/71  Pulse Rate (!) 124  Resp 16  SpO2 96 %  O2 Device Nasal Cannula  Patient Activity (if Appropriate) In bed  O2 Flow Rate (L/min) 2 L/min  Assess: MEWS Score  MEWS Temp 0  MEWS Systolic 0  MEWS Pulse 2  MEWS RR 0  MEWS LOC 0  MEWS Score 2  MEWS Score Color Yellow  Assess: if the MEWS score is Yellow or Red  Were vital signs taken at a resting state? Yes  Focused Assessment Change from prior assessment (see assessment flowsheet)  Early Detection of Sepsis Score *See Row Information* Medium  MEWS guidelines implemented *See Row Information* No, previously red, continue vital signs every 4 hours  Treat  MEWS Interventions Administered scheduled meds/treatments  Complains of Other (Comment) (Elevated HR)  Interventions Medication (see MAR)  Notify: Charge Nurse/RN  Name of Charge Nurse/RN Notified Jackie, RN  Date Charge Nurse/RN Notified 02/29/20  Time Charge Nurse/RN Notified 0532  Notify: Provider  Provider Name/Title Sharion Settler, NP   Date Provider Notified 02/29/20  Time Provider Notified [phone]  Notification Type Page  Notification Reason Change in status  Response No new orders (Give scheduled cardizem)  Date of Provider Response 02/29/20  Time of Provider Response 0533  Pt back to yellow MEWS r/t HR. Per Hassan Rowan, NP give po cardizem scheduled for 6am

## 2020-02-29 NOTE — Progress Notes (Signed)
   02/29/20 0400  Assess: MEWS Score  Temp 98.1 F (36.7 C)  BP (!) 173/94  Pulse Rate (!) 109  Resp 20  SpO2 99 %  O2 Device Nasal Cannula  O2 Flow Rate (L/min) 2 L/min  Assess: MEWS Score  MEWS Temp 0  MEWS Systolic 0  MEWS Pulse 1  MEWS RR 0  MEWS LOC 0  MEWS Score 1  MEWS Score Color Green  Assess: if the MEWS score is Yellow or Red  Were vital signs taken at a resting state? Yes  Focused Assessment No change from prior assessment  Early Detection of Sepsis Score *See Row Information* Medium  MEWS guidelines implemented *See Row Information* No, previously red, continue vital signs every 4 hours  Treat  MEWS Interventions Administered prn meds/treatments  Pain Scale 0-10  Pain Score 0  Complains of Other (Comment) (elevated BP)  Interventions Medication (see MAR)  PRN hydralazine given per parameters. Will continue to monitor

## 2020-02-29 NOTE — Progress Notes (Signed)
Patient ID: Anne [name], female   DOB: [birth year], 59 y.o.   MRN: [id number]    Rama Anne Fu MD Psychiatry note Very Brief entry  Again tried daughter where number is continuously busy, possibly off hook and or disconnected  Dr. Priscella Mann had contacted her and she is aware of general issues  I started Periactin and Very low dose zyprexa for mood and appetite as she  Might aspirate if too sedated at night .  She remains very ill depressed and distraught  Left message with CW Gina to call so that we can see if she has benefits and ability to go to NH as home health and all most likely will not suffice in this situation and she needs perm placement

## 2020-02-29 NOTE — Progress Notes (Addendum)
PROGRESS NOTE    Anne [name]  ZOX:[id number] DOB: 06-06-1961 DOA: 02/27/2020 PCP: Donnie Coffin, MD    Brief Narrative: HPI: Anne [name] is a 59 y.o. female with medical history significant of CVA, hypertension, coronary artery disease presents the emergency department for evaluation of abdominal pain associated with weakness and cough.  The patient is post CVA thus little difficult to understand this majority the history is gained by speaking with the ED physician in reading the associated documentation.  Patient reports she is here because she felt weak and more short of breath throughout the day.  No other obvious infectious contacts.  No known sick contacts.  No history of fevers.  No other associated symptoms.  Unable to fully characterize pain.  On my evaluation patient is resting in bed.  She appears chronically ill.  Is difficult to communicate with her secondary to CVA.  However she does understand and nods head yes or no to interview questions.  For the abdominal pain etiology is unclear.  She had a CT abdomen done in the emergency department that did not reveal any acute etiology.  Chest imaging was significant for interstitial edema.  Working diagnosis is acute hypoxic respiratory failure secondary to concomitant COPD and CHF.  8/12: Patient is much more awake today.  Communicative.  No slurred speech.  Remains on 3 to 4 L nasal cannula.  8/13: Patient seen and examined.  A little bit lethargic but respiratory status appears improved.  Now on 3 L last nasal cannula.  Saturating adequately.  8/14: Patient seen and examined.  More lethargic this morning.  No change in saturations.  Continues to require 3 L nasal cannula.  Breath sounds improving.  Per bedside RN patient was unable to sleep well last night.  8/15: Patient seen and examined.  Remains very lethargic.  Oxygen status actually improving.  Titrated down to 2 L nasal cannula.  Breath sounds also improving.  Patient seems  almost catatonic.  She does open eyes but very limited participation in interview.  I related that her oral intake has been very poor and that if she continues to not eat food after consider a Dobbhoff and initiation of tube feeds.  I also asked her about depressive symptoms which she did endorse.  I have called consultation with psychiatry.  8/16: Patient seen and examined.  Remains lethargic but improved over interval.  Breath sounds improving.  Oxygen status stabilizing.  Oral intake remains poor.  Psychiatric consultation called.  Discussed with Dr. Janese Banks.  Formal consultation note pending.  He was able to get a hold of family yesterday to discuss her presentation prior to starting any pharmacologic agents.  Assessment & Plan:   Active Problems:   Acute hypoxemic respiratory failure (HCC)  Acute hypoxemic respiratory failure Mild acute on chronic diastolic congestive heart failure Suspected exacerbation of COPD Patient presented with symptoms consistent with acute decompensated heart failure and concomitant COPD Improving over interval.  Patient did not appear markedly hypervolemic.  Believe that majority of symptoms driven by underlying COPD. 2d echo 01/01/20: normal EF.  Grade 2 diastolic dysfunction Plan: Continue bronchodilator therapy Continue p.o. prednisone 40 mg daily x5 days (day 5).  Stop after today's dose Wean oxygen as tolerated.  Patient did not have a home requirement Currently down to 2 L nasal cannula Last dose of antibiotic today Hold Lasix Continue home lisinopril Respiratory therapy following Target saturation 88 to 92%  Depression Continue Lexapro Patient appears very depressed.  She is  lying in bed constantly.  She does not participate in interview.  She is not eating any food.  I relate that with continued poor p.o. intake we would likely have to place a Dobbhoff and initiate tube feeds. -She is a little bit more awake today so again encouraged p.o. intake.  I also  encouraged her to speak to her daughter when she tries to call over the phone.  I spoke to the daughter yesterday.  Stated the patient does not picking up her phone when she calls. -Cycle try to reach out to patient's daughter to discuss presentation and will consider starting psychiatric medications -I will consult called consultation with palliative care.  I think the patient would benefit from goals of care discussion and potentially outpatient palliative care referral  Acute kidney injury, resolved Suspect prerenal azotemia in the setting of diuresis Lasix on hold Lisinopril restarted Avoid nephrotoxins  Coronary artery disease History of CVA Continue dual antiplatelet therapy  Hypertension Lisinopril restarted Continue home diltiazem As needed hydralazine  Suspected malnutrition RD consult Continue Megace  Hyperlipidemia Continue home Lipitor  DVT prophylaxis: Lovenox Code Status: Full Family Communication:  daughter [name] via phone [phone] on 8/15 Disposition Plan: Status is: Inpatient  Remains inpatient appropriate because:Inpatient level of care appropriate due to severity of illness   Dispo: The patient is from: Home              Anticipated d/c is to: SNF              Anticipated d/c date is: 3 days              Patient currently is not medically stable to d/c.   Patient is improved from respiratory standpoint.  Still with a small oxygen requirement.  IV feeling we can titrate this off within 24 hours.  Patient still with adult failure to thrive and poor nutrition.  Consultation: Psychiatry.  Formal recommendations pending.  Consultation called with palliative care.  Appreciate recommendations.        Consultants:   None  Procedures:   None  Antimicrobials:   Levaquin   Subjective: Patient seen and examined.  A little bit more awake today.  Again endorses weakness.  Not much participation in interview.  Objective: Vitals:    02/29/20 0400 02/29/20 0500 02/29/20 0529 02/29/20 0813  BP: (!) 173/94  (!) 144/71 (!) 167/85  Pulse: (!) 109  (!) 124 (!) 123  Resp: 20  16 16   Temp: 98.1 F (36.7 C)  98.7 F (37.1 C) 98.2 F (36.8 C)  TempSrc: Oral  Oral   SpO2: 99%  96% 96%  Weight:  81.9 kg    Height:        Intake/Output Summary (Last 24 hours) at 02/29/2020 1004 Last data filed at 02/29/2020 0452 Gross per 24 hour  Intake 1246.75 ml  Output 500 ml  Net 746.75 ml   Filed Weights   02/27/20 0500 02/28/20 0500 02/29/20 0500  Weight: 82.3 kg 82.1 kg 81.9 kg    Examination:  General: Lethargic, depressed, flattened HEENT: Normocephalic, atraumatic Neck, supple, trachea midline, no tenderness Heart: Tachycardic, regular rhythm, no murmurs Lungs: Bibasilar crackles.  No wheeze.  2 L Abdomen: Soft, nontender, nondistended, positive bowel sounds Extremities: Normal, atraumatic, no clubbing or cyanosis, normal muscle tone Skin: No rashes or lesions, normal color Neurologic: Right-sided deficit consistent with prior stroke.  Lethargic.  Oriented x2 Psychiatric: Flattened affect      Data Reviewed: I have personally  reviewed following labs and imaging studies  CBC: Recent Labs  Lab 02/25/20 0421 02/26/20 0417 02/27/20 0804 02/28/20 0603 02/29/20 0441  WBC 10.8* 20.1* 18.1* 16.4* 18.0*  NEUTROABS  --  17.9* 15.1* 13.2* 13.5*  HGB 13.6 13.1 14.5 14.9 15.1*  HCT 41.5 40.2 43.1 44.9 47.2*  MCV 86.1 87.2 83.7 84.7 87.4  PLT 170 200 253 257 956   Basic Metabolic Panel: Recent Labs  Lab 02/25/20 0421 02/26/20 0417 02/27/20 0804 02/28/20 0603 02/29/20 0441  NA 138 133* 138 143 142  K 4.5 3.9 4.3 4.2 4.0  CL 103 101 103 107 108  CO2 22 20* 23 24 21*  GLUCOSE 128* 117* 121* 110* 104*  BUN 23* 44* 40* 32* 30*  CREATININE 1.47* 1.36* 0.86 0.74 0.61  CALCIUM 10.9* 11.0* 11.4* 11.5* 11.5*   GFR: Estimated Creatinine Clearance: 83.9 mL/min (by C-G formula based on SCr of 0.61 mg/dL). Liver  Function Tests: Recent Labs  Lab 02/17/2020 0827  AST 16  ALT 33  ALKPHOS 62  BILITOT 0.8  PROT 7.2  ALBUMIN 3.7   Recent Labs  Lab 03/05/2020 0827  LIPASE 24   No results for input(s): AMMONIA in the last 168 hours. Coagulation Profile: No results for input(s): INR, PROTIME in the last 168 hours. Cardiac Enzymes: No results for input(s): CKTOTAL, CKMB, CKMBINDEX, TROPONINI in the last 168 hours. BNP (last 3 results) No results for input(s): PROBNP in the last 8760 hours. HbA1C: No results for input(s): HGBA1C in the last 72 hours. CBG: No results for input(s): GLUCAP in the last 168 hours. Lipid Profile: No results for input(s): CHOL, HDL, LDLCALC, TRIG, CHOLHDL, LDLDIRECT in the last 72 hours. Thyroid Function Tests: Recent Labs    02/29/20 0441  TSH 0.456   Anemia Panel: No results for input(s): VITAMINB12, FOLATE, FERRITIN, TIBC, IRON, RETICCTPCT in the last 72 hours. Sepsis Labs: No results for input(s): PROCALCITON, LATICACIDVEN in the last 168 hours.  Recent Results (from the past 240 hour(s))  SARS Coronavirus 2 by RT PCR (hospital order, performed in [name] hospital lab) Nasopharyngeal Nasopharyngeal Swab     Status: None   Collection Time: 02/23/2020  1:22 PM   Specimen: Nasopharyngeal Swab  Result Value Ref Range Status   SARS Coronavirus 2 NEGATIVE NEGATIVE Final    Comment: (NOTE) SARS-CoV-2 target nucleic acids are NOT DETECTED.  The SARS-CoV-2 RNA is generally detectable in upper and lower respiratory specimens during the acute phase of infection. The lowest concentration of SARS-CoV-2 viral copies this assay can detect is 250 copies / mL. A negative result does not preclude SARS-CoV-2 infection and should not be used as the sole basis for treatment or other patient management decisions.  A negative result may occur with improper specimen collection / handling, submission of specimen other than nasopharyngeal swab, presence of viral mutation(s)  within the areas targeted by this assay, and inadequate number of viral copies (<250 copies / mL). A negative result must be combined with clinical observations, patient history, and epidemiological information.  Fact Sheet for Patients:   StrictlyIdeas.no  Fact Sheet for Healthcare Providers: BankingDealers.co.za  This test is not yet approved or  cleared by the [place] FDA and has been authorized for detection and/or diagnosis of SARS-CoV-2 by FDA under an Emergency Use Authorization (EUA).  This EUA will remain in effect (meaning this test can be used) for the duration of the COVID-19 declaration under Section 564(b)(1) of the Act, 21 U.S.C. section 360bbb-3(b)(1), unless the  authorization is terminated or revoked sooner.  Performed at [facility], [address] [place],  [address]          Radiology Studies: CT HEAD WO CONTRAST  Result Date: 02/27/2020 CLINICAL DATA:  Altered mental status. EXAM: CT HEAD WITHOUT CONTRAST TECHNIQUE: Contiguous axial images were obtained from the base of the skull through the vertex without intravenous contrast. COMPARISON:  December 31, 2019 FINDINGS: Brain: There is mild cerebral atrophy with widening of the extra-axial spaces and ventricular dilatation. There are areas of decreased attenuation within the white matter tracts of the supratentorial brain, consistent with microvascular disease changes. A chronic left basal ganglia lacunar infarct is noted. Small areas of cortical encephalomalacia, with adjacent chronic white matter low attenuation, are seen within the right parietal lobe. Vascular: No hyperdense vessel or unexpected calcification. Skull: Normal. Negative for fracture or focal lesion. Sinuses/Orbits: No acute finding. Other: None. IMPRESSION: 1. Generalized cerebral atrophy. 2. Chronic left basal ganglia lacunar infarct. 3. Small areas of cortical encephalomalacia,  with adjacent chronic white matter low attenuation, are seen within the right parietal lobe which may represent sequelae associated with a chronic infarct. 4. No acute intracranial abnormality. Electronically Signed   By: Virgina Norfolk M.D.   On: 02/27/2020 18:44        Scheduled Meds: . arformoterol  15 mcg Nebulization BID  . aspirin EC  325 mg Oral Daily  . atorvastatin  40 mg Oral Daily  . budesonide (PULMICORT) nebulizer solution  0.25 mg Nebulization BID  . clopidogrel  75 mg Oral Daily  . diltiazem  120 mg Oral Daily  . enoxaparin (LOVENOX) injection  40 mg Subcutaneous Q24H  . escitalopram  10 mg Oral Daily  . feeding supplement (ENSURE ENLIVE)  237 mL Oral TID BM  . folic acid  1 mg Oral Daily  . ipratropium-albuterol  3 mL Nebulization BID  . lisinopril  5 mg Oral Daily  . megestrol  400 mg Oral BID  . multivitamin with minerals  1 tablet Oral Daily  . thiamine  100 mg Oral Daily   Continuous Infusions: . sodium chloride Stopped (02/29/20 0526)  . cefTRIAXone (ROCEPHIN)  IV Stopped (02/28/20 1056)  . doxycycline (VIBRAMYCIN) IV Stopped (02/29/20 0526)     LOS: 5 days    Time spent: 25 minutes    Sidney Ace, MD [facility] Pager [phone]  If 7PM-7AM, please contact night-coverage 02/29/2020, 10:04 AM

## 2020-02-29 NOTE — TOC Initial Note (Signed)
Transition of Care [facility]) - Initial/Assessment Note    Patient Details  Name: Anne [name] MRN: [id number] Date of Birth: 1960-09-13  [facility] [place] [facility] CM/SW Contact:    Shelbie Hutching, RN Phone Number: 02/29/2020, 4:35 PM  Clinical Narrative:                 Patient is from home with her daughter, Anne [name].  Patient recently discharged from [facility] after having a stroke.  Patient has deficits from the stroke on the left side.  Patient also has COPD and CHF on acute O2 at 2L currently.   RNCM discussed discharge planning with daughter today via phone.  Daughter would like for the patient to go home with her at discharge with home health services and PCS services.  It looks like patient will have PCS services through [place] will attempt to find a home health agency to accept for skilled services.   Patient has all needed equipment at home, walker, wheelchair, hospital bed, wheelchair and hoyer lift.   Palliative care has been consulted.  RNCM discussed patient going home with OP Palliative, daughter is not ready to discuss that at this time.  MD will call and update daughter tomorrow.   RNCM will cont to follow and assist with discharge.   Expected Discharge Plan: [place] Barriers to Discharge: Continued Medical Work up   Patient Goals and CMS Choice Patient states their goals for this hospitalization and ongoing recovery are:: Patient's daughter would like for her to come home at discharge CMS Medicare.gov Compare Post Acute Care list provided to:: Patient Represenative (must comment) Choice offered to / list presented to : Adult Children  Expected Discharge Plan and Services Expected Discharge Plan: [place] In-house Referral: Hospice / Palliative Care Discharge Planning Services: CM Consult Post Acute Care Choice: [place] arrangements for the past 2 months: Single Family Home                                       Prior Living Arrangements/Services Living arrangements for the past 2 months: Single Family Home Lives with:: Adult Children (daughter) Patient language and need for interpreter reviewed:: Yes Do you feel safe going back to the place where you live?: Yes      Need for Family Participation in Patient Care: Yes (Comment) (previous stroke, COPD, CHF) Care giver support system in place?: Yes (comment) (daughter) Current home services: DME (hospital bed, bedside commode, wheelchair, walker, hoyer lift) Criminal Activity/Legal Involvement Pertinent to Current Situation/Hospitalization: No - Comment as needed  Activities of Daily Living Home Assistive Devices/Equipment: Wheelchair, [employer] fast streamer ADL Screening (condition at time of admission) Patient's cognitive ability adequate to safely complete daily activities?: Yes Is the patient deaf or have difficulty hearing?: No Does the patient have difficulty seeing, even when wearing glasses/contacts?: No Does the patient have difficulty concentrating, remembering, or making decisions?: No Patient able to express need for assistance with ADLs?: Yes Does the patient have difficulty dressing or bathing?: Yes Independently performs ADLs?: No Communication: Independent Dressing (OT): Needs assistance Is this a change from baseline?: Pre-admission baseline Grooming: Needs assistance Is this a change from baseline?: Pre-admission baseline Feeding: Independent Bathing: Needs assistance Is this a change from baseline?: Pre-admission baseline Toileting: Needs assistance Is this a change from baseline?: Pre-admission baseline In/Out Bed: Needs assistance Is this a change  from baseline?: Pre-admission baseline Walks in Home: Needs assistance Is this a change from baseline?: Pre-admission baseline Does the patient have difficulty walking or climbing stairs?: Yes Weakness of Legs: Both Weakness of Arms/Hands: Both  Permission  Sought/Granted Permission sought to share information with : Case Manager, Family Supports    Share Information with NAME: Anne [name]     Permission granted to share info w Relationship: daughter     Emotional Assessment       Orientation: : Oriented to Self, Oriented to Place, Oriented to  Time, Oriented to Situation Alcohol / Substance Use: Not Applicable Psych Involvement: No (comment)  Admission diagnosis:  COPD exacerbation ([place]) [J44.1] Acute hypoxemic respiratory failure (HCC) [J96.01] Acute congestive heart failure, unspecified heart failure type ([place]) [I50.9] Patient Active Problem List   Diagnosis Date Noted  . Acute hypoxemic respiratory failure ([place]) 03/15/2020  . Hemiparesis affecting left side as late effect of stroke ([place])   . Elevated BUN   . Sleep disturbance   . Left foot pain   . Acute right MCA stroke ([place]) 01/08/2020  . Acute lower UTI   . Left hemiplegia ([place])   . Acute ischemic stroke ([place])   . Dysphagia, post-stroke   . Hyperglycemia   . Acute respiratory failure with hypoxia ([place])   . Stroke (cerebrum) ([place]) 12/31/2019  . Polysubstance abuse ([place]) 07/18/2018  . Coronary artery disease of native artery of native heart with stable angina pectoris ([place]) 04/15/2018  . Other nonspecific abnormal finding of lung field 04/15/2018  . Insomnia, unspecified 11/14/2016  . CAD (coronary artery disease)   . NSTEMI (non-ST elevated myocardial infarction) ([place]) 11/25/2014  . Chest pain 11/24/2014  . Major depressive disorder, recurrent episode, moderate degree ([place]) 11/10/2014  . Family history of colon cancer 12/17/2013  . Adenomatous polyp 12/17/2013  . Hypertension 10/21/2013  . Other disorders of lung 09/22/2013  . Chronic obstructive pulmonary disease with (acute) exacerbation ([place]) 07/07/2013  . Allergic conjunctivitis 03/13/2012  . Acid reflux disease 12/11/2007  . Asthma 09/24/2007  . Cigarette smoker 09/24/2007   PCP:  Donnie Coffin,  MD Pharmacy:   [place], [place] [facility] [address] [place] [address] Phone: [phone] Fax: [phone]     Social Determinants of Health (SDOH) Interventions    Readmission Risk Interventions No flowsheet data found.

## 2020-03-01 ENCOUNTER — Other Ambulatory Visit: Payer: Self-pay | Admitting: *Deleted

## 2020-03-01 ENCOUNTER — Inpatient Hospital Stay: Payer: Medicaid Other

## 2020-03-01 ENCOUNTER — Inpatient Hospital Stay
Admit: 2020-03-01 | Discharge: 2020-03-01 | Disposition: A | Discharge status: Home / Self Care | Payer: Medicaid Other | Attending: Internal Medicine | Admitting: Internal Medicine

## 2020-03-01 ENCOUNTER — Encounter: Payer: Self-pay | Admitting: Internal Medicine

## 2020-03-01 DIAGNOSIS — Z515 Encounter for palliative care: Secondary | ICD-10-CM

## 2020-03-01 DIAGNOSIS — J9601 Acute respiratory failure with hypoxia: Secondary | ICD-10-CM

## 2020-03-01 DIAGNOSIS — Z7189 Other specified counseling: Secondary | ICD-10-CM

## 2020-03-01 LAB — HEPARIN LEVEL (UNFRACTIONATED): Heparin Unfractionated: 1.57 IU/mL — ABNORMAL HIGH (ref 0.30–0.70)

## 2020-03-01 LAB — PROTIME-INR
INR: 1.4 — ABNORMAL HIGH (ref 0.8–1.2)
Prothrombin Time: 16.2 seconds — ABNORMAL HIGH (ref 11.4–15.2)

## 2020-03-01 LAB — CBC WITH DIFFERENTIAL/PLATELET
Abs Immature Granulocytes: 0.16 10*3/uL — ABNORMAL HIGH (ref 0.00–0.07)
Basophils Absolute: 0 10*3/uL (ref 0.0–0.1)
Basophils Relative: 0 %
Eosinophils Absolute: 0.1 10*3/uL (ref 0.0–0.5)
Eosinophils Relative: 1 %
HCT: 48.3 % — ABNORMAL HIGH (ref 36.0–46.0)
Hemoglobin: 15.4 g/dL — ABNORMAL HIGH (ref 12.0–15.0)
Immature Granulocytes: 1 %
Lymphocytes Relative: 23 %
Lymphs Abs: 3.2 10*3/uL (ref 0.7–4.0)
MCH: 28.2 pg (ref 26.0–34.0)
MCHC: 31.9 g/dL (ref 30.0–36.0)
MCV: 88.3 fL (ref 80.0–100.0)
Monocytes Absolute: 1.1 10*3/uL — ABNORMAL HIGH (ref 0.1–1.0)
Monocytes Relative: 8 %
Neutro Abs: 9.4 10*3/uL — ABNORMAL HIGH (ref 1.7–7.7)
Neutrophils Relative %: 67 %
Platelets: 238 10*3/uL (ref 150–400)
RBC: 5.47 MIL/uL — ABNORMAL HIGH (ref 3.87–5.11)
RDW: 13.6 % (ref 11.5–15.5)
WBC: 13.9 10*3/uL — ABNORMAL HIGH (ref 4.0–10.5)
nRBC: 0 % (ref 0.0–0.2)

## 2020-03-01 LAB — GLUCOSE, CAPILLARY
Glucose-Capillary: 113 mg/dL — ABNORMAL HIGH (ref 70–99)
Glucose-Capillary: 97 mg/dL (ref 70–99)

## 2020-03-01 LAB — MRSA PCR SCREENING: MRSA by PCR: NEGATIVE

## 2020-03-01 LAB — APTT: aPTT: 26 seconds (ref 24–36)

## 2020-03-01 MED ORDER — FUROSEMIDE 10 MG/ML IJ SOLN
40.0000 mg | Freq: Once | INTRAMUSCULAR | Status: AC
  Administered 2020-03-01: 40 mg via INTRAVENOUS
  Filled 2020-03-01: qty 4

## 2020-03-01 MED ORDER — HEPARIN BOLUS VIA INFUSION
4000.0000 [IU] | Freq: Once | INTRAVENOUS | Status: AC
  Administered 2020-03-01: 4000 [IU] via INTRAVENOUS
  Filled 2020-03-01: qty 4000

## 2020-03-01 MED ORDER — IOHEXOL 350 MG/ML SOLN
75.0000 mL | Freq: Once | INTRAVENOUS | Status: AC | PRN
  Administered 2020-03-01: 75 mL via INTRAVENOUS

## 2020-03-01 MED ORDER — ORAL CARE MOUTH RINSE
15.0000 mL | Freq: Two times a day (BID) | OROMUCOSAL | Status: DC
  Administered 2020-03-02: 15 mL via OROMUCOSAL

## 2020-03-01 MED ORDER — HEPARIN (PORCINE) 25000 UT/250ML-% IV SOLN
1300.0000 [IU]/h | INTRAVENOUS | Status: DC
  Administered 2020-03-01: 1300 [IU]/h via INTRAVENOUS
  Filled 2020-03-01: qty 250

## 2020-03-01 MED ORDER — SODIUM CHLORIDE 0.9 % IV SOLN
3.0000 g | Freq: Four times a day (QID) | INTRAVENOUS | Status: DC
  Administered 2020-03-01: 3 g via INTRAVENOUS
  Filled 2020-03-01 (×2): qty 8
  Filled 2020-03-01: qty 3
  Filled 2020-03-01 (×2): qty 8

## 2020-03-01 MED ORDER — CHLORHEXIDINE GLUCONATE 0.12 % MT SOLN
15.0000 mL | Freq: Two times a day (BID) | OROMUCOSAL | Status: DC
  Administered 2020-03-01 – 2020-03-02 (×2): 15 mL via OROMUCOSAL
  Filled 2020-03-01: qty 15

## 2020-03-01 MED ORDER — HEPARIN (PORCINE) 25000 UT/250ML-% IV SOLN: 1100.0000 [IU]/h | INTRAVENOUS | Status: DC

## 2020-03-01 MED ORDER — LACTATED RINGERS IV SOLN: INTRAVENOUS | Status: DC

## 2020-03-01 MED ORDER — CHLORHEXIDINE GLUCONATE CLOTH 2 % EX PADS
6.0000 | MEDICATED_PAD | Freq: Every day | CUTANEOUS | Status: DC
  Administered 2020-03-01: 6 via TOPICAL

## 2020-03-01 NOTE — Progress Notes (Signed)
OT Cancellation Note  Patient Details Name: Anne [name] MRN: [id number] DOB: Nov 14, 1960   Cancelled Treatment:    Reason Eval/Treat Not Completed: Medical issues which prohibited therapy  Pt transferred to CCU d/t severe hypoxia and potential for PE. D/t change in medical status, will complete OT order at this time. Please re-consult if/when pt becomes appropriate for therapy participation. Thank you.  Gerrianne Scale, [place], OTR/L ascom [phone] 03/01/20, 1:58 PM

## 2020-03-01 NOTE — Significant Event (Signed)
Rapid Response Event Note   Reason for Call : hypoxia, increased oxygen requirements   Initial Focused Assessment: pt laying in bed, lethargic, on NRB      Interventions: Patient brought back to ICU for bipap   Plan of Care:     Event Summary:   MD Notified:  Call Time: Arrival Time: End Time:  Mila Merry, RN

## 2020-03-01 NOTE — Progress Notes (Addendum)
ANTICOAGULATION CONSULT NOTE   Pharmacy Consult for Heparin infusoin Indication: high clinical suspicion pulmonary embolus  No Known Allergies  Patient Measurements: Height: 5\' 8"  (172.7 cm) Weight: 82 kg (180 lb 12.4 oz) IBW/kg (Calculated) : 63.9 Heparin Dosing Weight: 80.5 kg  Vital Signs: Temp: 98.2 F (36.8 C) (08/17 1900) Temp Source: Axillary (08/17 1900) BP: 129/112 (08/17 2000) Pulse Rate: 136 (08/17 2000)  Labs: Recent Labs    02/28/20 0603 02/28/20 0603 02/28/20 2002 02/28/20 2125 02/29/20 0028 02/29/20 0441 02/29/20 1411 03/01/20 0544 03/01/20 1338 03/01/20 2041  HGB 14.9   < >  --   --   --  15.1*  --  15.4*  --   --   HCT 44.9  --   --   --   --  47.2*  --  48.3*  --   --   PLT 257  --   --   --   --  263  --  238  --   --   APTT  --   --   --   --   --   --   --   --  26  --   LABPROT  --   --   --   --   --   --   --   --  16.2*  --   INR  --   --   --   --   --   --   --   --  1.4*  --   HEPARINUNFRC  --   --   --   --   --   --   --   --   --  1.57*  CREATININE 0.74  --   --   --   --  0.61  --   --   --   --   TROPONINIHS  --   --    < > 31* 24*  --  40*  --   --   --    < > = values in this interval not displayed.    Estimated Creatinine Clearance: 83.9 mL/min (by C-G formula based on SCr of 0.61 mg/dL).   Medical History: Past Medical History:  Diagnosis Date  . Asthma   . CAD (coronary artery disease)    a. cardiac cath 02/04/2013: tubular 20% stenosis pRCA, tubular 30% stenosis mRCA, medically managed  . Coronary vasospasm (HCC)    a. cardiac cath 11/25/2014: mLCx 30%, pRCA 30%, moderate spasm w/ noted improvement w/ reimaging, recommended CCB, long acting nitrate, smoking cessation, & statin   . Hypertension   . Polysubstance abuse ([place])    a. remote history of crack/cocaine abuse approximately 8-10 years ago, ongoing tobacco abuse since age 40, rare etoh abuse  . Stroke ([place])   . Tubular adenoma      Assessment: Pharmacy has been  consulted for heparin dosing and monitoring for a 59yo female for high clinical suspicion of PE. PMH significant for acute ischemic CVA (12/31/19), CAD, HTN. Patient was previously on prophylactic dosing of Lovenox. Per chart review, patient has no anticoagulation prior to admission.   MD ordered CT to rule out PE. Patient is tachycardic; was on 4L [place] and is now on Bi-PAP  Goal of Therapy:  Heparin level 0.3-0.7 units/ml Monitor platelets by anticoagulation protocol: Yes   Plan:  8/17 @ 2201 HL 1.57. Supratherapeutic. Confirmed with nursing no bleeding concerns/events. Will HOLD the heparin infusion for 1 hr.  AFTER 1 hr, will reduce the heparin infusion rate to 1100 units/hr. Nursing notified of changes.  Check anti-Xa level in 6 hours and daily while on heparin Continue to monitor H&H and platelets    Rowland Lathe, PharmD 03/01/2020 10:12 PM

## 2020-03-01 NOTE — Progress Notes (Signed)
OT Cancellation Note  Patient Details Name: Anne [name] MRN: [id number] DOB: 12-10-1960   Cancelled Treatment:    Reason Eval/Treat Not Completed: Medical issues which prohibited therapy  Rapid response called for pt d/t decreased O2 saturations, requiring non re-breather at 15L. Will hold OT at this time and f/u for treatment as able/as pt more medically appropriate. Thank you.  Gerrianne Scale, [place], OTR/L ascom [phone] 03/01/20, 11:42 AM

## 2020-03-01 NOTE — Progress Notes (Signed)
Called sarah  rn ccu to give report on pt  But she said she was fine. Dr Priscella Mann had given  Her an update at bedside  Before pt was transferred.

## 2020-03-01 NOTE — Progress Notes (Signed)
pts hr  Up.  Will send message to md.  Cn notified. Continue vs.  Give meds. Alertly responds when spoken to.

## 2020-03-01 NOTE — Progress Notes (Addendum)
ANTICOAGULATION CONSULT NOTE - Initial Consult  Pharmacy Consult for Heparin infusoin Indication: high clinical suspicion pulmonary embolus  No Known Allergies  Patient Measurements: Height: 5\' 8"  (172.7 cm) Weight: 82 kg (180 lb 12.4 oz) IBW/kg (Calculated) : 63.9 Heparin Dosing Weight: 80.5 kg  Vital Signs: Temp: 98 F (36.7 C) (08/17 1209) Temp Source: Axillary (08/17 1209) BP: 127/74 (08/17 1209) Pulse Rate: 134 (08/17 1209)  Labs: Recent Labs    02/28/20 0603 02/28/20 0603 02/28/20 2002 02/28/20 2125 02/29/20 0028 02/29/20 0441 02/29/20 1411 03/01/20 0544  HGB 14.9   < >  --   --   --  15.1*  --  15.4*  HCT 44.9  --   --   --   --  47.2*  --  48.3*  PLT 257  --   --   --   --  263  --  238  CREATININE 0.74  --   --   --   --  0.61  --   --   TROPONINIHS  --   --    < > 31* 24*  --  40*  --    < > = values in this interval not displayed.    Estimated Creatinine Clearance: 83.9 mL/min (by C-G formula based on SCr of 0.61 mg/dL).   Medical History: Past Medical History:  Diagnosis Date  . Asthma   . CAD (coronary artery disease)    a. cardiac cath 02/04/2013: tubular 20% stenosis pRCA, tubular 30% stenosis mRCA, medically managed  . Coronary vasospasm (HCC)    a. cardiac cath 11/25/2014: mLCx 30%, pRCA 30%, moderate spasm w/ noted improvement w/ reimaging, recommended CCB, long acting nitrate, smoking cessation, & statin   . Hypertension   . Polysubstance abuse ([facility])    a. remote history of crack/cocaine abuse approximately 8-10 years ago, ongoing tobacco abuse since age 65, rare etoh abuse  . Stroke ([facility])   . Tubular adenoma      Assessment: Pharmacy has been consulted for heparin dosing and monitoring for a 59yo female for high clinical suspicion of PE. PMH significant for acute ischemic CVA (12/31/19), CAD, HTN. Patient was previously on prophylactic dosing of Lovenox. Per chart review, patient has no anticoagulation prior to admission.   MD ordered CT  to rule out PE. Patient is tachycardic; was on 4L [place] and is now on Bi-PAP  Goal of Therapy:  Heparin level 0.3-0.7 units/ml Monitor platelets by anticoagulation protocol: Yes   Plan:  Give 4000 units bolus x 1 Start heparin infusion at 1300 units/hr Check anti-Xa level in 6 hours and daily while on heparin Continue to monitor H&H and platelets  Sherilyn Banker, PharmD Pharmacy Resident  03/01/2020 1:08 PM

## 2020-03-01 NOTE — Progress Notes (Signed)
[place] observed pt.'s dtr. [name] tearful at pt.'s bedside while rounding w/CH Mariea Clonts; [facility] entered rm. and learned from dtr. that pt. had been on floor unit until approx. 1hr. ago when she developed difficulty breathing.  Pt. lying in bed wearing BiPap mask, holding dtr's hand.  Dtr. spoke w/Dr. Mortimer Fries re: plan in case pt. requires additional O2; at length dtr. tearfully agreed that her mother would not want to be intubated: 'I know she's tired... I don't want her to suffer.'  Dtr. in agreement w/plan to continue w/current meds and BiPap but no intubation; dtr. resumed seat at bedside, called family members to talk to pt. over speakerphone.  CHs made dtr. aware of availability if needed.

## 2020-03-01 NOTE — Progress Notes (Signed)
Pt off unit for chest CT.

## 2020-03-01 NOTE — Progress Notes (Signed)
Pt resting in bed quietly on bipap. Daughter at bedside. Chest CT today shows PE. Pt on heparin gtt, vascular consult for tomorrow. Pt was made a DNR today. HR remains elevated in the 130s, Dr. Mortimer Fries aware. All other VSS at this time. Pt, daughter, and vascular will go through surgical options for PE tomorrow morning. Pt is alert and oriented X3 but very drowsy and fatigued.

## 2020-03-01 NOTE — Consult Note (Signed)
Pharmacy Antibiotic Note  Anne [name] is a 59 y.o. female admitted on 02/15/2020 with aspiration pneumonia.  Pharmacy has been consulted for Unasyn dosing.  Plan: Unasyn 3 g IV q6h  Height: 5\' 8"  (172.7 cm) Weight: 82 kg (180 lb 12.4 oz) IBW/kg (Calculated) : 63.9  Temp (24hrs), Avg:98.3 F (36.8 C), Min:97.6 F (36.4 C), Max:98.8 F (37.1 C)  Recent Labs  Lab 02/25/20 0421 02/25/20 0421 02/26/20 0417 02/27/20 0804 02/28/20 0603 02/29/20 0441 03/01/20 0544  WBC 10.8*   < > 20.1* 18.1* 16.4* 18.0* 13.9*  CREATININE 1.47*  --  1.36* 0.86 0.74 0.61  --    < > = values in this interval not displayed.    Estimated Creatinine Clearance: 83.9 mL/min (by C-G formula based on SCr of 0.61 mg/dL).    No Known Allergies  Antimicrobials this admission: Levofloxacin 8/11 x 1 Ceftriaxone 8/13 >> 8/16 Doxycycline 8/13 >> 8/16 Unasyn 8/17 >>   Dose adjustments this admission: n/a  Microbiology results: 8/17 MRSA PCR: pending  Thank you for allowing pharmacy to be a part of this patient's care.  Benita Gutter 03/01/2020 1:01 PM

## 2020-03-01 NOTE — Progress Notes (Signed)
  Chaplain On-Call responded to Rapid Response notification.  Learned from RN Sharol Harness that the patient is transferred to the CCU.  Chaplain will inform afternoon Chaplains for follow-up.  [place] Aaryana Betke M.Div., [facility]

## 2020-03-01 NOTE — Progress Notes (Signed)
Pt transported to CT and back to CCU while on the vent.

## 2020-03-01 NOTE — Progress Notes (Signed)
Pt returned from CT at this time.  

## 2020-03-01 NOTE — Progress Notes (Signed)
PROGRESS NOTE    Anne [name]  ZOX:[id number] DOB: [birth year] DOA: 02/26/2020 PCP: Donnie Coffin, MD    Brief Narrative: HPI: Anne [name] is a 59 y.o. female with medical history significant of CVA, hypertension, coronary artery disease presents the emergency department for evaluation of abdominal pain associated with weakness and cough.  The patient is post CVA thus little difficult to understand this majority the history is gained by speaking with the ED physician in reading the associated documentation.  Patient reports she is here because she felt weak and more short of breath throughout the day.  No other obvious infectious contacts.  No known sick contacts.  No history of fevers.  No other associated symptoms.  Unable to fully characterize pain.  On my evaluation patient is resting in bed.  She appears chronically ill.  Is difficult to communicate with her secondary to CVA.  However she does understand and nods head yes or no to interview questions.  For the abdominal pain etiology is unclear.  She had a CT abdomen done in the emergency department that did not reveal any acute etiology.  Chest imaging was significant for interstitial edema.  Working diagnosis is acute hypoxic respiratory failure secondary to concomitant COPD and CHF.  8/12: Patient is much more awake today.  Communicative.  No slurred speech.  Remains on 3 to 4 L nasal cannula.  8/13: Patient seen and examined.  A little bit lethargic but respiratory status appears improved.  Now on 3 L last nasal cannula.  Saturating adequately.  8/14: Patient seen and examined.  More lethargic this morning.  No change in saturations.  Continues to require 3 L nasal cannula.  Breath sounds improving.  Per bedside RN patient was unable to sleep well last night.  8/15: Patient seen and examined.  Remains very lethargic.  Oxygen status actually improving.  Titrated down to 2 L nasal cannula.  Breath sounds also improving.  Patient seems  almost catatonic.  She does open eyes but very limited participation in interview.  I related that her oral intake has been very poor and that if she continues to not eat food after consider a Dobbhoff and initiation of tube feeds.  I also asked her about depressive symptoms which she did endorse.  I have called consultation with psychiatry.  8/16: Patient seen and examined.  Remains lethargic but improved over interval.  Breath sounds improving.  Oxygen status stabilizing.  Oral intake remains poor.  Psychiatric consultation called.  Discussed with Dr. Janese Banks.  Formal consultation note pending.  He was able to get a hold of family yesterday to discuss her presentation prior to starting any pharmacologic agents.  8/17: Patient seen and examined.  May be a little bit more awake today.  A little bit more verbally responsive.  Seen by psychiatry yesterday.  Psychiatric medications and appetite stimulation ordered.  Patient remains very lethargic.  Oxygen status remained stable at 3 L.  Assessment & Plan:   Active Problems:   Acute hypoxemic respiratory failure (HCC)  Acute hypoxemic respiratory failure Mild acute on chronic diastolic congestive heart failure Suspected exacerbation of COPD Patient presented with symptoms consistent with acute decompensated heart failure and concomitant COPD Improving over interval.  Patient did not appear markedly hypervolemic.  Believe that majority of symptoms driven by underlying COPD. 2d echo 01/01/20: normal EF.  Grade 2 diastolic dysfunction Plan: Continue bronchodilator therapy Completed prednisone course Wean oxygen as tolerated.  Patient did not have a home requirement Currently  down to 2 L nasal cannula Completed empiric course of antibiotics Hold Lasix Continue home lisinopril Respiratory therapy following Target saturation 88 to 92%  Depression Continue Lexapro Patient appears very depressed.  She is lying in bed constantly.  She does not  participate in interview.  She is not eating any food.  I relate that with continued poor p.o. intake we would likely have to place a Dobbhoff and initiate tube feeds. -03/01/2020: Psychiatry evaluated and started psychiatric medications.  Recommendations appreciated.  Patient may be a little bit more awake but still remains very lethargic and depressed.  Will need to encourage her to take p.o. is much as possible as the alternative would be placing a Dobbhoff and initiating tube feeds  Acute kidney injury, resolved Suspect prerenal azotemia in the setting of diuresis Lasix on hold Lisinopril restarted Avoid nephrotoxins  Coronary artery disease History of CVA Continue dual antiplatelet therapy  Hypertension Tachycardia Patient's blood pressure intermittently spikes up requiring intravenous as needed medications.  Continue current regimen for now.  Patient is also been persistently tachycardic.  I feel this is due to malnutrition and poor p.o. intake.  We can treat as needed for sustained tachycardia however optimization of her nutritional status if it would go long way to addressing the tachycardia.  Suspected malnutrition RD consult Continue Megace Appetite stimulation with cyproheptadine and Remeron added  Hyperlipidemia Continue home Lipitor  DVT prophylaxis: Lovenox Code Status: Full Family Communication:  daughter [name] via phone [phone] on 8/17 Disposition Plan: Status is: Inpatient  Remains inpatient appropriate because:Inpatient level of care appropriate due to severity of illness   Dispo: The patient is from: Home              Anticipated d/c is to: SNF              Anticipated d/c date is: 3 days              Patient currently is not medically stable to d/c.   Patient COPD treatment has near completion.  Still with oxygen requirement however I feel we can titrate this off.  We should be targeting a goal saturation 88 to 92%.  Unfortunately patient remains  very depressed and lethargic.  She is not taking in enough oral intake.  Consultation called with palliative care.  Their evaluation and recommendations are greatly appreciated.  Disposition at this   Consultants:   None  Procedures:   None  Antimicrobials:   Levaquin   Subjective: Patient seen and examined.  A little bit more awake this AM.  Still lethargic and with poor appetite.  Objective: Vitals:   03/01/20 0500 03/01/20 0604 03/01/20 0736 03/01/20 0748  BP:  (!) 156/83  (!) 158/84  Pulse:  (!) 106  (!) 118  Resp:  16  18  Temp:  98.5 F (36.9 C)  97.6 F (36.4 C)  TempSrc:  Oral  Oral  SpO2:  96% 97% 99%  Weight: 82 kg     Height:        Intake/Output Summary (Last 24 hours) at 03/01/2020 1036 Last data filed at 03/01/2020 0534 Gross per 24 hour  Intake --  Output 1000 ml  Net -1000 ml   Filed Weights   02/28/20 0500 02/29/20 0500 03/01/20 0500  Weight: 82.1 kg 81.9 kg 82 kg    Examination:  General: Lethargic, depressed, flattened HEENT: Normocephalic, atraumatic Neck, supple, trachea midline, no tenderness Heart: Tachycardic, regular rhythm, no murmurs Lungs: Bibasilar crackles.  No wheeze.  2 L Abdomen: Soft, nontender, nondistended, positive bowel sounds Extremities: Normal, atraumatic, no clubbing or cyanosis, normal muscle tone Skin: No rashes or lesions, normal color Neurologic: Right-sided deficit consistent with prior stroke.  Lethargic.  Oriented x2 Psychiatric: Flattened affect      Data Reviewed: I have personally reviewed following labs and imaging studies  CBC: Recent Labs  Lab 02/26/20 0417 02/27/20 0804 02/28/20 0603 02/29/20 0441 03/01/20 0544  WBC 20.1* 18.1* 16.4* 18.0* 13.9*  NEUTROABS 17.9* 15.1* 13.2* 13.5* 9.4*  HGB 13.1 14.5 14.9 15.1* 15.4*  HCT 40.2 43.1 44.9 47.2* 48.3*  MCV 87.2 83.7 84.7 87.4 88.3  PLT 200 253 257 263 161   Basic Metabolic Panel: Recent Labs  Lab 02/25/20 0421 02/26/20 0417  02/27/20 0804 02/28/20 0603 02/29/20 0441  NA 138 133* 138 143 142  K 4.5 3.9 4.3 4.2 4.0  CL 103 101 103 107 108  CO2 22 20* 23 24 21*  GLUCOSE 128* 117* 121* 110* 104*  BUN 23* 44* 40* 32* 30*  CREATININE 1.47* 1.36* 0.86 0.74 0.61  CALCIUM 10.9* 11.0* 11.4* 11.5* 11.5*   GFR: Estimated Creatinine Clearance: 83.9 mL/min (by C-G formula based on SCr of 0.61 mg/dL). Liver Function Tests: Recent Labs  Lab 02/21/2020 0827  AST 16  ALT 33  ALKPHOS 62  BILITOT 0.8  PROT 7.2  ALBUMIN 3.7   Recent Labs  Lab 02/15/2020 0827  LIPASE 24   No results for input(s): AMMONIA in the last 168 hours. Coagulation Profile: No results for input(s): INR, PROTIME in the last 168 hours. Cardiac Enzymes: No results for input(s): CKTOTAL, CKMB, CKMBINDEX, TROPONINI in the last 168 hours. BNP (last 3 results) No results for input(s): PROBNP in the last 8760 hours. HbA1C: No results for input(s): HGBA1C in the last 72 hours. CBG: No results for input(s): GLUCAP in the last 168 hours. Lipid Profile: No results for input(s): CHOL, HDL, LDLCALC, TRIG, CHOLHDL, LDLDIRECT in the last 72 hours. Thyroid Function Tests: Recent Labs    02/29/20 0441  TSH 0.456   Anemia Panel: No results for input(s): VITAMINB12, FOLATE, FERRITIN, TIBC, IRON, RETICCTPCT in the last 72 hours. Sepsis Labs: No results for input(s): PROCALCITON, LATICACIDVEN in the last 168 hours.  Recent Results (from the past 240 hour(s))  SARS Coronavirus 2 by RT PCR (hospital order, performed in [facility] hospital lab) Nasopharyngeal Nasopharyngeal Swab     Status: None   Collection Time: 03/11/2020  1:22 PM   Specimen: Nasopharyngeal Swab  Result Value Ref Range Status   SARS Coronavirus 2 NEGATIVE NEGATIVE Final    Comment: (NOTE) SARS-CoV-2 target nucleic acids are NOT DETECTED.  The SARS-CoV-2 RNA is generally detectable in upper and lower respiratory specimens during the acute phase of infection. The  lowest concentration of SARS-CoV-2 viral copies this assay can detect is 250 copies / mL. A negative result does not preclude SARS-CoV-2 infection and should not be used as the sole basis for treatment or other patient management decisions.  A negative result may occur with improper specimen collection / handling, submission of specimen other than nasopharyngeal swab, presence of viral mutation(s) within the areas targeted by this assay, and inadequate number of viral copies (<250 copies / mL). A negative result must be combined with clinical observations, patient history, and epidemiological information.  Fact Sheet for Patients:   StrictlyIdeas.no  Fact Sheet for Healthcare Providers: BankingDealers.co.za  This test is not yet approved or  cleared by the [place]  States FDA and has been authorized for detection and/or diagnosis of SARS-CoV-2 by FDA under an Emergency Use Authorization (EUA).  This EUA will remain in effect (meaning this test can be used) for the duration of the COVID-19 declaration under Section 564(b)(1) of the Act, 21 U.S.C. section 360bbb-3(b)(1), unless the authorization is terminated or revoked sooner.  Performed at [facility], [address] [place], [place] [address]          Radiology Studies: No results found.      Scheduled Meds: . arformoterol  15 mcg Nebulization BID  . aspirin EC  325 mg Oral Daily  . atorvastatin  40 mg Oral Daily  . budesonide (PULMICORT) nebulizer solution  0.25 mg Nebulization BID  . clopidogrel  75 mg Oral Daily  . cyproheptadine  2 mg Oral TID  . diltiazem  120 mg Oral Daily  . enoxaparin (LOVENOX) injection  40 mg Subcutaneous Q24H  . escitalopram  10 mg Oral Daily  . feeding supplement (ENSURE ENLIVE)  237 mL Oral TID BM  . folic acid  1 mg Oral Daily  . ipratropium-albuterol  3 mL Nebulization BID  . lisinopril  5 mg Oral Daily  . megestrol  400 mg  Oral BID  . multivitamin with minerals  1 tablet Oral Daily  . OLANZapine  1.25 mg Oral QHS  . thiamine  100 mg Oral Daily   Continuous Infusions: . sodium chloride Stopped (02/29/20 0526)     LOS: 6 days    Time spent: 25 minutes    Sidney Ace, MD [facility] Pager [phone]  If 7PM-7AM, please contact night-coverage 03/01/2020, 10:36 AM

## 2020-03-01 NOTE — Patient Outreach (Signed)
Care Coordination  03/01/2020  Anne [name] February 12, 1961 [id number]  [facility] Care coordination-attempt to collaborate with [facility] CM/SW  [place] chat to [place] CM/SW after outreach from [place] [facility] [place] reports updated clinicals as pt has been hospitalized   Plan: [facility] RN CM follow up with Ms Gerstenberger/[name] after discharge  [place]. [name], RN, BSN, [place] Coordinator Office number [phone] Mobile number [phone]  Main [facility] number [phone] Fax number [phone]

## 2020-03-01 NOTE — Significant Event (Signed)
Approximately 1145 received page that rapid response been called.  Hypoxic respiratory failure.  Evaluated patient at bedside.  Mental status pretty consistent with prior.  Very lethargic.  Unable to stay awake.  Requiring 15 L nonrebreather to maintain saturations high 80s.  Transferred to CCU.  Attempted BiPAP therapy.  Minimally successful.  High suspicion for acute pulmonary embolism.  Empirically started heparin infusion.  CT PE study positive.  Fairly large clot burden.  ICU Dr. Following.  Potential for vascular intervention?  On heparin gtt.  Patient is now DNR/DNI.  Update to stepdown status.  Appreciate PCCM assistance.  Ralene Muskrat MD

## 2020-03-01 NOTE — Consult Note (Signed)
Name: Anne [name] MRN: [id number] DOB: 1961-04-01     CONSULTATION DATE: 03/08/2020  REFERRING MD : Priscella Mann  CHIEF COMPLAINT:  resp distress  STUDIES:  12/2019 MRI HEAD IMPRESSION:  1. Technically limited exam due to motion artifact. 2. Evolving moderate to large sized acute ischemic right MCA territory infarct as above. No associated hemorrhage or mass effect. 3. Underlying chronic bilateral basal ganglia lacunar infarcts, with additional small chronic left parietal infarct.    HISTORY OF PRESENT ILLNESS:   59 y.o.femalewith medical history significant ofacute ISCHEMIC CVA 12/31/2019 H/o hypertension, coronary artery disease presents the emergency department for evaluation of abdominal pain associated with weakness and cough.   No known sick contacts. No history of fevers. No other associated symptoms. Unable to fully characterize pain upon admission on 8/12  Transferred to ICU for hypoxia, CXR is clear, ABG with po2 54  difficult to communicate with her secondary to CVA.  However she does understand and nods head yes or no to interview questions. Working diagnosis is acute hypoxic respiratory failure secondary to concomitant COPD and CHF with presumed PE .  8/12: more awake today.  Communicative.  No slurred speech.  Remains on 3 to 4 L nasal cannula.  8/13:  A little bit lethargic but respiratory status appears improved.  Now on 3 L last nasal cannula.  Saturating adequately.  8/14: More lethargic this morning.  No change in saturations.  Continues to require 3 L nasal cannula.  Breath sounds improving.  Per bedside RN patient was unable to sleep well last night.  8/15:  Remains very lethargic.  Oxygen status actually improving.  Titrated down to 2 L nasal cannula.  Breath sounds also improving.  Patient seems almost catatonic.  PSYCH CONSULTED  8/16: Remains lethargic but improved over interval.  Breath sounds improving.  Oxygen status stabilizing.   Oral intake remains poor.    8/17:  May be a little bit more awake today.  A little bit more verbally responsive.    Psychiatric medications and appetite stimulation ordered.  Patient remains very lethargic.  Oxygen status remained stable at 3 L.  8/17 transferred to ICU for severe hypoxia, highly suggestive of PE, consider starting heparin infusion  PAST MEDICAL HISTORY :   has a past medical history of Asthma, CAD (coronary artery disease), Coronary vasospasm ([place]), Hypertension, Polysubstance abuse ([place]), Stroke ([place]), and Tubular adenoma.  has a past surgical history that includes Cardiac catheterization (N/A, 11/25/2014); IR PERCUTANEOUS ART THROMBECTOMY/INFUSION INTRACRANIAL INC DIAG ANGIO (12/31/2019); Radiology with anesthesia (Left, 12/31/2019); IR CT Head Ltd (12/31/2019); and IR PERCUTANEOUS ART THROMBECTOMY/INFUSION INTRACRANIAL INC DIAG ANGIO (12/31/2019). Prior to Admission medications   Medication Sig Start Date End Date Taking? Authorizing Provider  acetaminophen (TYLENOL) 325 MG tablet Take 2 tablets (650 mg total) by mouth every 6 (six) hours. 02/16/20   Love, Ivan Anchors, PA-C  aspirin 325 MG tablet Take 1 tablet (325 mg total) by mouth daily. 02/17/20   Love, Ivan Anchors, PA-C  atorvastatin (LIPITOR) 40 MG tablet Take 1 tablet (40 mg total) by mouth daily. 02/17/20   Love, Ivan Anchors, PA-C  cetirizine (ZYRTEC) 10 MG tablet Take 10 mg by mouth daily as needed for allergies.     [provider]  clopidogrel (PLAVIX) 75 MG tablet Take 1 tablet (75 mg total) by mouth daily. 02/17/20   Love, Ivan Anchors, PA-C  diltiazem (CARDIZEM) 30 MG tablet Take 1 tablet (30 mg total) by mouth every 6 (six) hours. 02/16/20  Love, Pamela S, PA-C  escitalopram (LEXAPRO) 10 MG tablet Take 1 tablet (10 mg total) by mouth daily. 02/17/20   Love, Ivan Anchors, PA-C  Fluticasone-Salmeterol (ADVAIR) 250-50 MCG/DOSE AEPB Inhale 1 puff into the lungs 2 (two) times daily. Patient not taking: Reported on 11/03/2016 11/25/14    Gladstone Lighter, MD  folic acid (FOLVITE) 1 MG tablet Take 1 tablet (1 mg total) by mouth daily. 02/17/20   Love, Ivan Anchors, PA-C  lidocaine (LIDODERM) 5 % Place 1 patch onto the skin daily. Apply at 8 am and remove at 8 pm daily. Purchase this over the counter 02/16/20   Love, Ivan Anchors, PA-C  lisinopril (ZESTRIL) 2.5 MG tablet Take 1 tablet (2.5 mg total) by mouth daily. 02/17/20   Love, Ivan Anchors, PA-C  megestrol (MEGACE) 400 MG/10ML suspension Take 10 mLs (400 mg total) by mouth 2 (two) times daily. 02/16/20   Love, Ivan Anchors, PA-C  Menthol-Methyl Salicylate (MUSCLE RUB) 10-15 % CREA Apply 1 application topically 2 (two) times daily as needed for muscle pain. To right thigh 02/16/20   Love, Ivan Anchors, PA-C  Multiple Vitamin (MULTIVITAMIN WITH MINERALS) TABS tablet Take 1 tablet by mouth daily. 02/17/20   Love, Ivan Anchors, PA-C  thiamine 100 MG tablet Take 1 tablet (100 mg total) by mouth daily. 02/17/20   Love, Ivan Anchors, PA-C  traZODone (DESYREL) 50 MG tablet Take 0.5-1 tablets (25-50 mg total) by mouth at bedtime as needed for sleep. 02/16/20   Love, Ivan Anchors, PA-C   No Known Allergies  FAMILY HISTORY:  family history includes Breast cancer (age of onset: 69) in her mother; CAD (age of onset: 59) in her father. SOCIAL HISTORY:  reports that she has been smoking. She has a 45.00 pack-year smoking history. She has never used smokeless tobacco. She reports current alcohol use. She reports that she does not use drugs.  REVIEW OF SYSTEMS:   Unable to obtain due to critical illness      Estimated body mass index is 27.49 kg/m as calculated from the following:   Height as of this encounter: 5\' 8"  (1.727 m).   Weight as of this encounter: 82 kg.    VITAL SIGNS: Temp:  [97.6 F (36.4 C)-98.8 F (37.1 C)] 98 F (36.7 C) (08/17 1209) Pulse Rate:  [95-134] 134 (08/17 1209) Resp:  [16-22] 22 (08/17 1209) BP: (127-168)/(74-84) 127/74 (08/17 1209) SpO2:  [86 %-100 %] 86 % (08/17 1209) Weight:  [82 kg] 82 kg  (08/17 1209)   I/O last 3 completed shifts: In: 296.8 [I.V.:46.8; IV Piggyback:250] Out: 1500 [Urine:1500] No intake/output data recorded.   SpO2: (!) 86 % O2 Flow Rate (L/min): 4 L/min   Physical Examination:  GENERAL:critically ill appearing, +resp distress HEAD: Normocephalic, atraumatic.  EYES: Pupils equal, round, reactive to light.  No scleral icterus.  MOUTH: Moist mucosal membrane. NECK: Supple. No JVD.  PULMONARY: +rhonchi, +wheezing CARDIOVASCULAR: S1 and S2. Regular rate and rhythm. No murmurs, rubs, or gallops.  GASTROINTESTINAL: Soft, nontender, -distended.  Positive bowel sounds.  MUSCULOSKELETAL: No swelling, clubbing, or edema.  NEUROLOGIC:intermittant lethargy answers yes/no left arm paresis SKIN:intact,warm,dry  I personally reviewed lab work that was obtained in last 24 hrs. CXR Independently reviewed-no acute process, no edema, no effusions  MEDICATIONS: I have reviewed all medications and confirmed regimen as documented   CULTURE RESULTS   Recent Results (from the past 240 hour(s))  SARS Coronavirus 2 by RT PCR (hospital order, performed in [facility] hospital lab) Nasopharyngeal Nasopharyngeal  Swab     Status: None   Collection Time: 02/15/2020  1:22 PM   Specimen: Nasopharyngeal Swab  Result Value Ref Range Status   SARS Coronavirus 2 NEGATIVE NEGATIVE Final    Comment: (NOTE) SARS-CoV-2 target nucleic acids are NOT DETECTED.  The SARS-CoV-2 RNA is generally detectable in upper and lower respiratory specimens during the acute phase of infection. The lowest concentration of SARS-CoV-2 viral copies this assay can detect is 250 copies / mL. A negative result does not preclude SARS-CoV-2 infection and should not be used as the sole basis for treatment or other patient management decisions.  A negative result may occur with improper specimen collection / handling, submission of specimen other than nasopharyngeal swab, presence of viral mutation(s)  within the areas targeted by this assay, and inadequate number of viral copies (<250 copies / mL). A negative result must be combined with clinical observations, patient history, and epidemiological information.  Fact Sheet for Patients:   StrictlyIdeas.no  Fact Sheet for Healthcare Providers: BankingDealers.co.za  This test is not yet approved or  cleared by the [place] FDA and has been authorized for detection and/or diagnosis of SARS-CoV-2 by FDA under an Emergency Use Authorization (EUA).  This EUA will remain in effect (meaning this test can be used) for the duration of the COVID-19 declaration under Section 564(b)(1) of the Act, 21 U.S.C. section 360bbb-3(b)(1), unless the authorization is terminated or revoked sooner.  Performed at [facility], [address], [place], [place] [address]           IMAGING    DG Chest Port 1 View  Result Date: 03/01/2020 CLINICAL DATA:  Hypoxia. EXAM: PORTABLE CHEST 1 VIEW COMPARISON:  Chest x-ray 02/15/2020, 01/04/2020.  CT 05/22/2019. FINDINGS: Mediastinum and hilar structures normal. Heart size stable. Bilateral interstitial prominence again noted, right side greater than left. Right base atelectasis with elevation right hemidiaphragm. Small right pleural effusion cannot be excluded. Thoracolumbar spine scoliosis. IMPRESSION: Bilateral interstitial prominence again noted, right side greater than left. Right base atelectasis with elevation of the right hemidiaphragm. Small right pleural effusion cannot be excluded. Electronically Signed   By: Marcello Moores  Register   On: 03/01/2020 12:15     Nutrition Status: Nutrition Problem: Inadequate oral intake Etiology: acute illness Signs/Symptoms: meal completion < 25% Interventions: Ensure Enlive (each supplement provides 350kcal and 20 grams of protein), MVI, Liberalize Diet  BMP Latest Ref Rng & Units 02/29/2020 02/28/2020 02/27/2020    Glucose 70 - 99 mg/dL 104(H) 110(H) 121(H)  BUN 6 - 20 mg/dL 30(H) 32(H) 40(H)  Creatinine 0.44 - 1.00 mg/dL 0.61 0.74 0.86  Sodium 135 - 145 mmol/L 142 143 138  Potassium 3.5 - 5.1 mmol/L 4.0 4.2 4.3  Chloride 98 - 111 mmol/L 108 107 103  CO2 22 - 32 mmol/L 21(L) 24 23  Calcium 8.9 - 10.3 mg/dL 11.5(H) 11.5(H) 11.4(H)        ASSESSMENT AND PLAN SYNOPSIS   Severe ACUTE Hypoxic and Hypercapnic Respiratory Failure due to COPD and acute daistol;ic heart failure and probable acute PE On biPAP High risk for intubation Start heparin infusion   PRESUMED PE WITH SEVERE HYPOXIA START HEPARIN INFUSION  ACUTE DIASTOLIC CARDIAC FAILURE- -oxygen as needed -Lasix as tolerated    NEUROLOGY H/o acute RT ishemic MCA stroke 12/2019 NEURO ADVISE Can give TPA 30 days out Can give Heparin infusion 14 days out   CARDIAC ICU monitoring   GI GI PROPHYLAXIS as indicated  NUTRITIONAL STATUS DIET-->NPO Constipation protocol  as indicated   ENDO - will use ICU hypoglycemic\Hyperglycemia protocol if needed    ELECTROLYTES -follow labs as needed -replace as needed -pharmacy consultation and following    DVT/GI PRX ordered and assessed TRANSFUSIONS AS NEEDED MONITOR FSBS I Assessed the need for Labs I Assessed the need for Foley I Assessed the need for Central Venous Line Family Discussion when available I Assessed the need for Mobilization I made an Assessment of medications to be adjusted accordingly Safety Risk assessment Completed  CASE DISCUSSED IN MULTIDISCIPLINARY ROUNDS WITH ICU TEAM   Critical Care Time devoted to patient care services described in this note is 45 minutes.   Overall, patient is critically ill, prognosis is guarded.      Corrin Parker, M.D.  [facility]  Medical Director [place] Director [facility] Cardio-Pulmonary Department

## 2020-03-01 NOTE — Progress Notes (Signed)
Report received from charge RN [name] at this time.

## 2020-03-01 NOTE — Progress Notes (Addendum)
The Clinical status was relayed to family in detail. Daughter and patient  Updated and notified of patients medical condition.  patient with increased WOB and using accessory muscles to breathe Explained to family course of therapy and the modalities     Patient with Progressive CVA with COPD and CHF with Possible PE  with very low probability/chance of meaningful recovery despite all aggressive and optimal medical therapy.  Process associated with suffering.  Daughter and Patient understand the situation.  The daughter and patient  have consented and agreed to DNR/DNI   Family are satisfied with Plan of action and management. All questions answered     Corrin Parker, M.D.  [facility]  Medical Director [place] Director [facility] Cardio-Pulmonary Department

## 2020-03-01 NOTE — Progress Notes (Signed)
PT Cancellation Note  Patient Details Name: Anne [name] MRN: [id number] DOB: Aug 28, 1960   Cancelled Treatment:    Reason Eval/Treat Not Completed:  (Per chart review, patient transferred to CCU due to decline in medical status.  Per guidelines, will complete initial order; please re-consult to resume services as medically appropriate.)   Jaretzy Lhommedieu H. Owens Shark, PT, DPT, NCS 03/01/20, 1:48 PM [phone]

## 2020-03-01 NOTE — Progress Notes (Signed)
Pt tranferred to ccu stepdown via bed at this time.rapid response called pts sats were  Down to 72.  02 3 l   nonrebreather  Placed and sats came  Up to 88%. md notified and  Port cxr  And abg ordered .  Pt responsive when name called.and was able to comm. With  Staff and md.hr still  Elevated. Unable to  Get  meds  In pt.

## 2020-03-02 ENCOUNTER — Encounter: Admission: EM | Disposition: E | Payer: Self-pay | Source: Skilled Nursing Facility | Attending: Internal Medicine

## 2020-03-02 ENCOUNTER — Other Ambulatory Visit: Payer: Self-pay | Admitting: *Deleted

## 2020-03-02 DIAGNOSIS — J9601 Acute respiratory failure with hypoxia: Secondary | ICD-10-CM | POA: Diagnosis not present

## 2020-03-02 DIAGNOSIS — A419 Sepsis, unspecified organism: Secondary | ICD-10-CM | POA: Diagnosis present

## 2020-03-02 DIAGNOSIS — I2699 Other pulmonary embolism without acute cor pulmonale: Secondary | ICD-10-CM

## 2020-03-02 LAB — ECHOCARDIOGRAM COMPLETE
Area-P 1/2: 5.97 cm2
Height: 68 in
P 1/2 time: 297 msec
S' Lateral: 2.2 cm
Weight: 2892.44 oz

## 2020-03-02 LAB — CBC WITH DIFFERENTIAL/PLATELET
Abs Immature Granulocytes: 0.43 10*3/uL — ABNORMAL HIGH (ref 0.00–0.07)
Basophils Absolute: 0.1 10*3/uL (ref 0.0–0.1)
Basophils Relative: 0 %
Eosinophils Absolute: 0.1 10*3/uL (ref 0.0–0.5)
Eosinophils Relative: 0 %
HCT: 49.9 % — ABNORMAL HIGH (ref 36.0–46.0)
Hemoglobin: 16 g/dL — ABNORMAL HIGH (ref 12.0–15.0)
Immature Granulocytes: 2 %
Lymphocytes Relative: 22 %
Lymphs Abs: 4.2 10*3/uL — ABNORMAL HIGH (ref 0.7–4.0)
MCH: 28.3 pg (ref 26.0–34.0)
MCHC: 32.1 g/dL (ref 30.0–36.0)
MCV: 88.3 fL (ref 80.0–100.0)
Monocytes Absolute: 1.3 10*3/uL — ABNORMAL HIGH (ref 0.1–1.0)
Monocytes Relative: 7 %
Neutro Abs: 13.2 10*3/uL — ABNORMAL HIGH (ref 1.7–7.7)
Neutrophils Relative %: 69 %
Platelets: 206 10*3/uL (ref 150–400)
RBC: 5.65 MIL/uL — ABNORMAL HIGH (ref 3.87–5.11)
RDW: 13.8 % (ref 11.5–15.5)
WBC: 19.2 10*3/uL — ABNORMAL HIGH (ref 4.0–10.5)
nRBC: 0 % (ref 0.0–0.2)

## 2020-03-02 LAB — BASIC METABOLIC PANEL
Anion gap: 12 (ref 5–15)
BUN: 48 mg/dL — ABNORMAL HIGH (ref 6–20)
CO2: 22 mmol/L (ref 22–32)
Calcium: 11.7 mg/dL — ABNORMAL HIGH (ref 8.9–10.3)
Chloride: 114 mmol/L — ABNORMAL HIGH (ref 98–111)
Creatinine, Ser: 1.19 mg/dL — ABNORMAL HIGH (ref 0.44–1.00)
GFR calc Af Amer: 57 mL/min — ABNORMAL LOW (ref >60)
GFR calc non Af Amer: 50 mL/min — ABNORMAL LOW (ref >60)
Glucose, Bld: 113 mg/dL — ABNORMAL HIGH (ref 70–99)
Potassium: 4.1 mmol/L (ref 3.5–5.1)
Sodium: 148 mmol/L — ABNORMAL HIGH (ref 135–145)

## 2020-03-02 LAB — PHOSPHORUS: Phosphorus: 4.7 mg/dL — ABNORMAL HIGH (ref 2.5–4.6)

## 2020-03-02 LAB — HEPARIN LEVEL (UNFRACTIONATED): Heparin Unfractionated: 1.41 IU/mL — ABNORMAL HIGH (ref 0.30–0.70)

## 2020-03-02 LAB — MAGNESIUM: Magnesium: 2.6 mg/dL — ABNORMAL HIGH (ref 1.7–2.4)

## 2020-03-02 SURGERY — PULMONARY THROMBECTOMY
Anesthesia: Moderate Sedation | Laterality: Bilateral

## 2020-03-02 MED ORDER — MORPHINE SULFATE (PF) 2 MG/ML IV SOLN
2.0000 mg | INTRAVENOUS | Status: DC | PRN
  Administered 2020-03-02 – 2020-03-03 (×4): 2 mg via INTRAVENOUS
  Administered 2020-03-04 (×3): 4 mg via INTRAVENOUS
  Filled 2020-03-02: qty 2
  Filled 2020-03-02: qty 1
  Filled 2020-03-02: qty 2
  Filled 2020-03-02: qty 1
  Filled 2020-03-02: qty 2
  Filled 2020-03-02 (×2): qty 1

## 2020-03-02 MED ORDER — POLYVINYL ALCOHOL 1.4 % OP SOLN
1.0000 [drp] | Freq: Four times a day (QID) | OPHTHALMIC | Status: DC | PRN
  Filled 2020-03-02: qty 15

## 2020-03-02 MED ORDER — GLYCOPYRROLATE 1 MG PO TABS
1.0000 mg | ORAL_TABLET | ORAL | Status: DC | PRN
  Filled 2020-03-02: qty 1

## 2020-03-02 MED ORDER — ACETAMINOPHEN 325 MG PO TABS: 650.0000 mg | ORAL_TABLET | Freq: Four times a day (QID) | ORAL | Status: DC | PRN

## 2020-03-02 MED ORDER — DIPHENHYDRAMINE HCL 50 MG/ML IJ SOLN: 25.0000 mg | INTRAMUSCULAR | Status: DC | PRN

## 2020-03-02 MED ORDER — GLYCOPYRROLATE 0.2 MG/ML IJ SOLN
0.2000 mg | INTRAMUSCULAR | Status: DC | PRN
  Administered 2020-03-04: 10:00:00 0.2 mg via INTRAVENOUS
  Filled 2020-03-02: qty 1

## 2020-03-02 MED ORDER — GLYCOPYRROLATE 0.2 MG/ML IJ SOLN: 0.2000 mg | INTRAMUSCULAR | Status: DC | PRN

## 2020-03-02 MED ORDER — HEPARIN (PORCINE) 25000 UT/250ML-% IV SOLN
900.0000 [IU]/h | INTRAVENOUS | Status: DC
  Administered 2020-03-02: 900 [IU]/h via INTRAVENOUS
  Filled 2020-03-02: qty 250

## 2020-03-02 MED ORDER — ACETAMINOPHEN 650 MG RE SUPP: 650.0000 mg | Freq: Four times a day (QID) | RECTAL | Status: DC | PRN

## 2020-03-02 MED ORDER — LEVOFLOXACIN IN D5W 500 MG/100ML IV SOLN
500.0000 mg | INTRAVENOUS | Status: DC
  Filled 2020-03-02 (×2): qty 100

## 2020-03-02 MED ORDER — DEXTROSE 5 % IV SOLN: INTRAVENOUS | Status: DC

## 2020-03-02 NOTE — Patient Outreach (Signed)
[facility]) Care Management  02/29/2020  Anne [name] 1960/10/07 [id number]   [facility] care/disease management- care coordination note  Review of Epic notes  Patient admitted on 02/14/2020, a week ago from home On 02/23/20 Daughter, Anne [name] to report patient with abdominal pain Bowel elimination interventions reviewed. An outreach to [facility] 24 hr nurse call center for similar symptoms prior to returning to hospital Review of Dr Richarda Blade 03/03/2020 notes  - acute hypoxemic respiratory failure exacerbated by acute on chronic CHF/COPD/possible new PE with treatment bipap, possible thrombectomy, heparin drip,bronchodilator therapy, antibiotic therapy, psych consult for depression management (lexapro, zyprexa, thiamine), RD consult and  palliative care consult   Plan [facility] RN CM will pend services per [facility] workflow and await [facility] hospital liaison update    Ander Wamser L. [name], RN, BSN, [place] Coordinator Office number [phone] Mobile number [phone]  Main [facility] number [phone] Fax number [phone]

## 2020-03-02 NOTE — Progress Notes (Signed)
Nutrition Brief Follow-Up Note  Chart reviewed. Pt now transitioning to comfort care.   No further nutrition interventions warranted at this time. Please re-consult RD as needed.   Jacklynn Barnacle, MS, RD, LDN Pager number available on Amion

## 2020-03-02 NOTE — Progress Notes (Signed)
PROGRESS NOTE    Anne [name]  QVH:[id number] DOB: 1960-12-12 DOA: 02/14/2020 PCP: Emogene Morgan, MD    Brief Narrative: HPI: Anne [name] is a 59 y.o. female with medical history significant of CVA, hypertension, coronary artery disease presents the emergency department for evaluation of abdominal pain associated with weakness and cough.  The patient is post CVA thus little difficult to understand this majority the history is gained by speaking with the ED physician in reading the associated documentation.  Patient reports she is here because she felt weak and more short of breath throughout the day.  No other obvious infectious contacts.  No known sick contacts.  No history of fevers.  No other associated symptoms.  Unable to fully characterize pain.  On my evaluation patient is resting in bed.  She appears chronically ill.  Is difficult to communicate with her secondary to CVA.  However she does understand and nods head yes or no to interview questions.  For the abdominal pain etiology is unclear.  She had a CT abdomen done in the emergency department that did not reveal any acute etiology.  Chest imaging was significant for interstitial edema.  Working diagnosis is acute hypoxic respiratory failure secondary to concomitant COPD and CHF.  8/12: Patient is much more awake today.  Communicative.  No slurred speech.  Remains on 3 to 4 L nasal cannula.  8/13: Patient seen and examined.  A little bit lethargic but respiratory status appears improved.  Now on 3 L last nasal cannula.  Saturating adequately.  8/14: Patient seen and examined.  More lethargic this morning.  No change in saturations.  Continues to require 3 L nasal cannula.  Breath sounds improving.  Per bedside RN patient was unable to sleep well last night.  8/15: Patient seen and examined.  Remains very lethargic.  Oxygen status actually improving.  Titrated down to 2 L nasal cannula.  Breath sounds also improving.  Patient seems  almost catatonic.  She does open eyes but very limited participation in interview.  I related that her oral intake has been very poor and that if she continues to not eat food after consider a Dobbhoff and initiation of tube feeds.  I also asked her about depressive symptoms which she did endorse.  I have called consultation with psychiatry.  8/16: Patient seen and examined.  Remains lethargic but improved over interval.  Breath sounds improving.  Oxygen status stabilizing.  Oral intake remains poor.  Psychiatric consultation called.  Discussed with Dr. Smith Robert.  Formal consultation note pending.  He was able to get a hold of family yesterday to discuss her presentation prior to starting any pharmacologic agents.  03/10/2020 -patient was seen and examined it appears the patient has been progressively deteriorating, lethargic not engaging in conversation -CT scan revealed pulmonary embolism.  Patient started on heparin drip-vascular surgery consulted Patient is confirmed DNR/DNI -palliative care will be consulted   Addendum: Family present at bedside.  Discussed with critical care team: Dr. Belia Heman.  Vascular team has offered thrombectomy. Patient and family has refused any further intervention including BiPAP -they have verbalized to Dr. Jeralene Huff for comfort care measures.  Orders has been placed by  PCCM critical  Met with the family at bedside confirmed comfort care measures...   Assessment & Plan:   Active Problems:   Acute hypoxemic respiratory failure (HCC)  Acute hypoxemic respiratory failure -exacerbated by acute on chronic D CHF/COPD exacerbation/possible new PE -CT angiogram of the chest >> reported acute  pulmonary emboli involving the right and left pulmonary artery multiple lobular segmental emboli --possible saddle emboli -Patient has been initiated on heparin drip -Pulmonary critical care following closely  -Remain on BiPAP satting 92% -Vascular surgery consulted appreciate  input -Palliative care team consulted, appreciate follow-up   Acute decompensated heart failure and concomitant COPD -PCCM following, continue BiPAP  - Patient did not appear markedly hypervolemic.  Believe that majority of symptoms driven by underlying COPD. 2d echo 01/01/20: normal EF.  Grade 2 diastolic dysfunction  Continue bronchodilator therapy Continue p.o. prednisone 40 mg daily x5 days (day 5).  Stop after today's dose   Last dose of antibiotic 8/17 Worsening leukocytosis 13.9--19.2 today (anticipating restarting empiric antibiotics) Hold Lasix Continue home lisinopril Target saturation 88 to 92%--back on BiPAP  Depression Continue Lexapro, Zyprexa, thiamine Patient appears very depressed.  She is lying in bed constantly.  She does not participate in interview.  She is not eating any food.   I relate that with continued poor p.o. intake we would likely have to place a Dobbhoff and initiate tube feeds.  -We will again encouraged p.o. intake. -Psych try to reach out to patient's daughter to discuss presentation and will consider starting psychiatric medications -Palliative care consulted to determine goal of care -Discussed with her daughter in detail  Acute kidney injury, resolved Suspect prerenal azotemia in the setting of diuresis -We will continue to hold Lasix, Lisinopril restarted  Avoid nephrotoxins  Coronary artery disease History of CVA Continue dual antiplatelet therapy  Hypertension Lisinopril restarted Continue home diltiazem As needed hydralazine  Suspected malnutrition RD consult Continue Megace  Hyperlipidemia Continue home Lipitor  DVT prophylaxis: Lovenox Code Status: Full Family Communication:  daughter [name] via phone [phone] on 03/01/2020 $RemoveBe'@1300'[id number]$  -she was updated, she was informed that the patient not doing well and her prognosis is grim  Disposition Plan: Status is: Inpatient  Remains inpatient appropriate because:Inpatient  level of care appropriate due to severity of illness   Dispo: The patient is from: Home              Anticipated d/c is to: SNF              Anticipated d/c date is: 3 days              Patient currently is not medically stable to d/c.   Patient is improved from respiratory standpoint.  Still with a small oxygen requirement.  IV feeling we can titrate this off within 24 hours.  Patient still with adult failure to thrive and poor nutrition.  Consultation: Psychiatry.  Formal recommendations pending.  Consultation called with palliative care.  Appreciate recommendations.     Consultants:  PCCM General surgery Vascular surgery Palliative care team  Procedures:   Possible thrombectomy  Antimicrobials:   Levaquin   Subjective: Patient seen and examined.  A little bit more awake today.  Again endorses weakness.  Not much participation in interview.  Objective: Vitals:   03/11/2020 0900 03/03/2020 1000 03/03/2020 1005 03/01/2020 1007  BP: 140/86 (!) 134/93 (!) 134/93 (!) 134/93  Pulse: (!) 121     Resp: 19 14    Temp:      TempSrc:      SpO2: 100% 94%    Weight:      Height:        Intake/Output Summary (Last 24 hours) at 02/20/2020 1302 Last data filed at 02/16/2020 0800 Gross per 24 hour  Intake 940.8 ml  Output 2250 ml  Net -1309.2 ml   Filed Weights   03/01/20 0500 03/01/20 1209 03/06/2020 0158  Weight: 82 kg 82 kg 82.5 kg    Examination: No major changes, patient was seen and examined, remained lethargic, depressed, Minimal involvement physical exam:  General: Lethargic HEENT: Normocephalic, atraumatic Neck, supple negative JVD, Heart: Regular rate and rhythm-episodic tachycardia Lungs: Negative wheezes crackles, on 2 L of oxygen, diffuse rhonchi  Abdomen: Soft, nontender, nondistended, positive bowel sounds Extremities: Normal, atraumatic, no clubbing or cyanosis, normal muscle tone Skin: No rashes or lesions, normal color Neurologic: No new focal neurological  findings, remained lethargic not oriented x1  right-sided deficit consistent with prior stroke. Psychiatric: Flattened affect -not engaging in conversation on physical exam      Data Reviewed: I have personally reviewed following labs and imaging studies  CBC: Recent Labs  Lab 02/27/20 0804 02/28/20 0603 02/29/20 0441 03/01/20 0544 02/21/2020 0604  WBC 18.1* 16.4* 18.0* 13.9* 19.2*  NEUTROABS 15.1* 13.2* 13.5* 9.4* 13.2*  HGB 14.5 14.9 15.1* 15.4* 16.0*  HCT 43.1 44.9 47.2* 48.3* 49.9*  MCV 83.7 84.7 87.4 88.3 88.3  PLT 253 257 263 238 161   Basic Metabolic Panel: Recent Labs  Lab 02/26/20 0417 02/27/20 0804 02/28/20 0603 02/29/20 0441 02/18/2020 0604  NA 133* 138 143 142 148*  K 3.9 4.3 4.2 4.0 4.1  CL 101 103 107 108 114*  CO2 20* 23 24 21* 22  GLUCOSE 117* 121* 110* 104* 113*  BUN 44* 40* 32* 30* 48*  CREATININE 1.36* 0.86 0.74 0.61 1.19*  CALCIUM 11.0* 11.4* 11.5* 11.5* 11.7*  MG  --   --   --   --  2.6*  PHOS  --   --   --   --  4.7*   GFR: Estimated Creatinine Clearance: 56.6 mL/min (A) (by C-G formula based on SCr of 1.19 mg/dL (H)). Liver Function Tests: No results for input(s): AST, ALT, ALKPHOS, BILITOT, PROT, ALBUMIN in the last 168 hours. No results for input(s): LIPASE, AMYLASE in the last 168 hours. No results for input(s): AMMONIA in the last 168 hours. Coagulation Profile: Recent Labs  Lab 03/01/20 1338  INR 1.4*   Cardiac Enzymes: No results for input(s): CKTOTAL, CKMB, CKMBINDEX, TROPONINI in the last 168 hours. BNP (last 3 results) No results for input(s): PROBNP in the last 8760 hours. HbA1C: No results for input(s): HGBA1C in the last 72 hours. CBG: Recent Labs  Lab 03/01/20 1131 03/01/20 1157  GLUCAP 113* 97   Lipid Profile: No results for input(s): CHOL, HDL, LDLCALC, TRIG, CHOLHDL, LDLDIRECT in the last 72 hours. Thyroid Function Tests: Recent Labs    02/29/20 0441  TSH 0.456   Anemia Panel: No results for input(s):  VITAMINB12, FOLATE, FERRITIN, TIBC, IRON, RETICCTPCT in the last 72 hours. Sepsis Labs: No results for input(s): PROCALCITON, LATICACIDVEN in the last 168 hours.  Recent Results (from the past 240 hour(s))  SARS Coronavirus 2 by RT PCR (hospital order, performed in [facility] hospital lab) Nasopharyngeal Nasopharyngeal Swab     Status: None   Collection Time: 03/03/2020  1:22 PM   Specimen: Nasopharyngeal Swab  Result Value Ref Range Status   SARS Coronavirus 2 NEGATIVE NEGATIVE Final    Comment: (NOTE) SARS-CoV-2 target nucleic acids are NOT DETECTED.  The SARS-CoV-2 RNA is generally detectable in upper and lower respiratory specimens during the acute phase of infection. The lowest concentration of SARS-CoV-2 viral copies this assay can detect is 250 copies / mL. A negative result  does not preclude SARS-CoV-2 infection and should not be used as the sole basis for treatment or other patient management decisions.  A negative result may occur with improper specimen collection / handling, submission of specimen other than nasopharyngeal swab, presence of viral mutation(s) within the areas targeted by this assay, and inadequate number of viral copies (<250 copies / mL). A negative result must be combined with clinical observations, patient history, and epidemiological information.  Fact Sheet for Patients:   StrictlyIdeas.no  Fact Sheet for Healthcare Providers: BankingDealers.co.za  This test is not yet approved or  cleared by the [place] FDA and has been authorized for detection and/or diagnosis of SARS-CoV-2 by FDA under an Emergency Use Authorization (EUA).  This EUA will remain in effect (meaning this test can be used) for the duration of the COVID-19 declaration under Section 564(b)(1) of the Act, 21 U.S.C. section 360bbb-3(b)(1), unless the authorization is terminated or revoked sooner.  Performed at [facility],  [address] [place], [place] [address]   MRSA PCR Screening     Status: None   Collection Time: 03/01/20 12:11 PM   Specimen: Nasal Mucosa; Nasopharyngeal  Result Value Ref Range Status   MRSA by PCR NEGATIVE NEGATIVE Final    Comment:        The GeneXpert MRSA Assay (FDA approved for NASAL specimens only), is one component of a comprehensive MRSA colonization surveillance program. It is not intended to diagnose MRSA infection nor to guide or monitor treatment for MRSA infections. Performed at [facility], [address] [place], [place] [address]          Radiology Studies: CT ANGIO CHEST PE W OR WO CONTRAST  Result Date: 03/01/2020 CLINICAL DATA:  Respiratory failure, PE suspected EXAM: CT ANGIOGRAPHY CHEST WITH CONTRAST TECHNIQUE: Multidetector CT imaging of the chest was performed using the standard protocol during bolus administration of intravenous contrast. Multiplanar CT image reconstructions and MIPs were obtained to evaluate the vascular anatomy. CONTRAST:  64mL OMNIPAQUE IOHEXOL 350 MG/ML SOLN COMPARISON:  None. FINDINGS: Cardiovascular: Multifocal acute pulmonary emboli present. There is involvement of the right and left pulmonary arteries extending into lobar and segmental branches. Centrally, there is involvement of the main pulmonary artery bifurcation. Normal heart size without evidence of right heart strain. Mediastinum/Nodes: No enlarged lymph nodes. Visualized thyroid is unremarkable. Lungs/Pleura: Centrilobular emphysema. There is atelectasis at the right lung base. Additional small opacity at the right lung base may reflect atelectasis or less likely infarct. No pleural effusion or pneumothorax. Upper Abdomen: No acute abnormality. Partially imaged left adrenal mass measuring 3.3 cm. This has increased in size in comparison to 2015 presumably reflecting a slow-growing adenoma. Musculoskeletal: No acute osseous abnormality. Review of the MIP images  confirms the above findings. IMPRESSION: Acute pulmonary emboli involving right and left pulmonary arteries and multiple lobar and segmental branches. There is also some involvement of the main pulmonary artery bifurcation reflecting saddle embolus. However, there is no imaging evidence of right heart strain. These results were called by telephone at the time of interpretation on 03/01/2020 at 3:04 pm to provider [facility] , who verbally acknowledged these results. Electronically Signed   By: Macy Mis M.D.   On: 03/01/2020 15:07   DG Chest Port 1 View  Result Date: 03/01/2020 CLINICAL DATA:  Hypoxia. EXAM: PORTABLE CHEST 1 VIEW COMPARISON:  Chest x-ray 02/21/2020, 01/04/2020.  CT 05/22/2019. FINDINGS: Mediastinum and hilar structures normal. Heart size stable. Bilateral interstitial prominence again noted, right  side greater than left. Right base atelectasis with elevation right hemidiaphragm. Small right pleural effusion cannot be excluded. Thoracolumbar spine scoliosis. IMPRESSION: Bilateral interstitial prominence again noted, right side greater than left. Right base atelectasis with elevation of the right hemidiaphragm. Small right pleural effusion cannot be excluded. Electronically Signed   By: Greer   On: 03/01/2020 12:15        Scheduled Meds: . arformoterol  15 mcg Nebulization BID  . aspirin EC  325 mg Oral Daily  . atorvastatin  40 mg Oral Daily  . budesonide (PULMICORT) nebulizer solution  0.25 mg Nebulization BID  . chlorhexidine  15 mL Mouth Rinse BID  . Chlorhexidine Gluconate Cloth  6 each Topical Daily  . clopidogrel  75 mg Oral Daily  . cyproheptadine  2 mg Oral TID  . diltiazem  120 mg Oral Daily  . escitalopram  10 mg Oral Daily  . feeding supplement (ENSURE ENLIVE)  237 mL Oral TID BM  . folic acid  1 mg Oral Daily  . ipratropium-albuterol  3 mL Nebulization BID  . lisinopril  5 mg Oral Daily  . mouth rinse  15 mL Mouth Rinse q12n4p  .  multivitamin with minerals  1 tablet Oral Daily  . OLANZapine  1.25 mg Oral QHS  . thiamine  100 mg Oral Daily   Continuous Infusions: . sodium chloride Stopped (02/29/20 0526)  . heparin 900 Units/hr (02/24/2020 1221)  . lactated ringers 75 mL/hr at 02/21/2020 0800     LOS: 7 days    Time spent: 25 minutes    Deatra James, MD [facility] Please feel free to use secure chat-and text via needed  If 7PM-7AM, please contact night-coverage 02/15/2020, 1:02 PM

## 2020-03-02 NOTE — Progress Notes (Signed)
The Clinical status was relayed to family in detail. Daughters at bedside, patient wishes to take off biPAP and be made comfortable  Updated and notified of patients medical condition.  patient with increased WOB and using accessory muscles to breathe Explained to family course of therapy and the modalities     Patient with Progressive multiorgan failure with very low chance of meaningful recovery despite all aggressive and optimal medical therapy.  Patient is in the dying  Process associated with suffering.  Family understands the situation.  They have consented and agreed to DNR/DNI and would like to proceed with Comfort care measures.  Family are satisfied with Plan of action and management. All questions answered     Corrin Parker, M.D.  [facility]  Medical Director [place] Director [facility]

## 2020-03-02 NOTE — Progress Notes (Addendum)
Daily Progress Note   Patient Name: Anne [name]       Date: 03/08/2020 DOB: Oct 10, 1960  Age: 59 y.o. MRN#: [id number] Attending Physician: Deatra James, MD Primary Care Physician: Donnie Coffin, MD Admit Date: 03/06/2020  Reason for Consultation/Follow-up: Psychosocial/spiritual support  Subjective: Patient is now comfort care per CCM. Daughter is at bedside and is very tearful. She states a few of her family members have just left. She states her mother was clear this morning she was ready to die and did not want any further life prolonging care. We discussed the conversation in detail. She discusses her mother's faith. This has brought her peace.  Comfort care cart ordered. Patient appears comfortable.   Length of Stay: 7  Current Medications: Scheduled Meds:    Continuous Infusions: . sodium chloride Stopped (02/29/20 0526)    PRN Meds: sodium chloride, acetaminophen **OR** acetaminophen, acetaminophen **OR** acetaminophen, glycopyrrolate **OR** glycopyrrolate **OR** glycopyrrolate, morphine injection, polyvinyl alcohol, traZODone  Physical Exam Constitutional:      Comments: Resting with eyes closed.              Vital Signs: BP (!) 83/57   Pulse (!) 134   Temp (!) 96.8 F (36 C) (Axillary)   Resp (!) 27   Ht 5\' 8"  (1.727 m)   Wt 82.5 kg   SpO2 92%   BMI 27.65 kg/m  SpO2: SpO2: 92 % O2 Device: O2 Device: High Flow Nasal Cannula O2 Flow Rate: O2 Flow Rate (L/min): 40 L/min  Intake/output summary:   Intake/Output Summary (Last 24 hours) at 02/24/2020 1617 Last data filed at 03/09/2020 1300 Gross per 24 hour  Intake 1216.02 ml  Output 1550 ml  Net -333.98 ml   LBM: Last BM Date: 02/15/20 Baseline Weight: Weight: 68 kg Most recent weight: Weight: 82.5  kg       Palliative Assessment/Data:      Patient Active Problem List   Diagnosis Date Noted  . Acute hypoxemic respiratory failure ([place]) 02/16/2020  . Hemiparesis affecting left side as late effect of stroke ([place])   . Elevated BUN   . Sleep disturbance   . Left foot pain   . Acute right MCA stroke ([place]) 01/08/2020  . Acute lower UTI   . Left hemiplegia ([place])   . Acute ischemic stroke ([place])   .  Dysphagia, post-stroke   . Hyperglycemia   . Acute respiratory failure with hypoxia ([place])   . Stroke (cerebrum) ([place]) 12/31/2019  . Polysubstance abuse ([place]) 07/18/2018  . Coronary artery disease of native artery of native heart with stable angina pectoris ([place]) 04/15/2018  . Other nonspecific abnormal finding of lung field 04/15/2018  . Insomnia, unspecified 11/14/2016  . CAD (coronary artery disease)   . NSTEMI (non-ST elevated myocardial infarction) ([place]) 11/25/2014  . Chest pain 11/24/2014  . Major depressive disorder, recurrent episode, moderate degree ([place]) 11/10/2014  . Family history of colon cancer 12/17/2013  . Adenomatous polyp 12/17/2013  . Hypertension 10/21/2013  . Other disorders of lung 09/22/2013  . Chronic obstructive pulmonary disease with (acute) exacerbation ([place]) 07/07/2013  . Allergic conjunctivitis 03/13/2012  . Acid reflux disease 12/11/2007  . Asthma 09/24/2007  . Cigarette smoker 09/24/2007    Palliative Care Assessment & Plan    Recommendations/Plan:  Anticipated hospital death. Patient appears comfortable; orders placed by CCM.     Code Status:    Code Status Orders  (From admission, onward)         Start     Ordered   03/09/2020 1403  DNR (Do not attempt resuscitation)  Continuous       Question Answer Comment  In the event of cardiac or respiratory ARREST Do not call a "code blue"   In the event of cardiac or respiratory ARREST Do not perform Intubation, CPR, defibrillation or ACLS   In the event of cardiac or respiratory ARREST Use  medication by any route, position, wound care, and other measures to relive pain and suffering. May use oxygen, suction and manual treatment of airway obstruction as needed for comfort.      03/01/2020 1403        Code Status History    Date Active Date Inactive Code Status Order ID Comments User Context   03/01/2020 1354 02/17/2020 1403 DNR [id number]  Flora Lipps, MD Inpatient   03/08/2020 1648 03/01/2020 1354 Full Code [id number]  Sidney Ace, MD ED   01/08/2020 1646 02/16/2020 1825 Full Code [id number]  Bary Leriche, PA-C Inpatient   12/31/2019 1543 01/08/2020 1640 Full Code [id number]  Marliss Coots, PA-C ED   11/25/2014 1102 11/25/2014 2018 Full Code [id number]  Minna Merritts, MD Inpatient   11/24/2014 2221 11/25/2014 1102 Full Code [id number]  Fritzi Mandes, MD Inpatient   Advance Care Planning Activity       Prognosis:   Hours - Days    Care plan was discussed with CCM NP  Thank you for allowing the Palliative Medicine Team to assist in the care of this patient.   Time In: 3:40 Time Out: 4:20 Total Time 50 min Prolonged Time Billed no      Greater than 50%  of this time was spent counseling and coordinating care related to the above assessment and plan.  Asencion Gowda, NP  Please contact Palliative Medicine Team phone at [phone] for questions and concerns.

## 2020-03-02 NOTE — Plan of Care (Signed)

## 2020-03-02 NOTE — Consult Note (Signed)
Consultation Note Date: 03/15/2020   Patient Name: Anne [name]  DOB: Nov 19, 1960  MRN: [id number]  Age / Sex: 59 y.o., female  PCP: Anne Coffin, MD Referring Physician: Deatra James, MD  Reason for Consultation: Establishing goals of care  HPI/Patient Profile: Anne [name] is a 59 y.o. female with medical history significant of CVA, hypertension, coronary artery disease presents the emergency department for evaluation of abdominal pain associated with weakness and cough.      Clinical Assessment and Goals of Care: Patient is resting in bed on BIPAP. She appears frail and weak. Daughter is at bedside and is  tearful. She tells me her mother had been very independent prior to her CVA in June. She states after the CVA she did not do well. [name] went to rehab and per daughter was showing improvement when her daughter was going on a daily basis to visit. When daughter stopped going to visit daily, her mother began to decline, and per daughter patient told her she was depressed and missed seeing her daughter daily.   We discussed her diagnosis, prognosis, GOC, EOL wishes disposition and options.  A detailed discussion was had today regarding advanced directives.  Concepts specific to code status, artifical feeding and hydration, IV antibiotics and rehospitalization were discussed.  The difference between an aggressive medical intervention path and a comfort care path was discussed.  Values and goals of care important to patient and family were attempted to be elicited.  Discussed limitations of medical interventions to prolong quality of life in some situations and discussed the concept of human mortality.  Daughter states she wants what can be done for her mother. He mother nods in agreement. CCM present. Following CCM departure, daughter asked about prognosis which she understands is poor.       SUMMARY OF RECOMMENDATIONS   Continue current care.   Prognosis:   Poor      Primary Diagnoses: Present on Admission: . Acute hypoxemic respiratory failure ([place])   I have reviewed the medical record, interviewed the patient and family, and examined the patient. The following aspects are pertinent.  Past Medical History:  Diagnosis Date  . Asthma   . CAD (coronary artery disease)    a. cardiac cath 02/04/2013: tubular 20% stenosis pRCA, tubular 30% stenosis mRCA, medically managed  . Coronary vasospasm (HCC)    a. cardiac cath 11/25/2014: mLCx 30%, pRCA 30%, moderate spasm w/ noted improvement w/ reimaging, recommended CCB, long acting nitrate, smoking cessation, & statin   . Hypertension   . Polysubstance abuse ([place])    a. remote history of crack/cocaine abuse approximately 8-10 years ago, ongoing tobacco abuse since age 64, rare etoh abuse  . Stroke ([place])   . Tubular adenoma    Social History   Socioeconomic History  . Marital status: Single    Spouse name: Not on file  . Number of children: 1  . Years of education: Not on file  . Highest education level: Not on file  Occupational History  .  Occupation: unemployed  Tobacco Use  . Smoking status: Current Every Day Smoker    Packs/day: 1.00    Years: 45.00    Pack years: 45.00  . Smokeless tobacco: Never Used  Substance and Sexual Activity  . Alcohol use: Yes  . Drug use: No  . Sexual activity: Yes  Other Topics Concern  . Not on file  Social History Narrative   Had 2 daughters but one passed in 2004.  Anne [name] is the youngest daughter   Social Determinants of Radio broadcast assistant Strain: Low Risk   . Difficulty of Paying Living Expenses: Not very hard  Food Insecurity: No Food Insecurity  . Worried About Charity fundraiser in the Last Year: Never true  . Ran Out of Food in the Last Year: Never true  Transportation Needs: Unmet Transportation Needs  . Lack of Transportation (Medical): Yes  .  Lack of Transportation (Non-Medical): Yes  Physical Activity: Inactive  . Days of Exercise per Week: 0 days  . Minutes of Exercise per Session: 0 min  Stress: No Stress Concern Present  . Feeling of Stress : Only a little  Social Connections: Moderately Integrated  . Frequency of Communication with Friends and Family: Twice a week  . Frequency of Social Gatherings with Friends and Family: Twice a week  . Attends Religious Services: 1 to 4 times per year  . Active Member of Clubs or Organizations: No  . Attends Archivist Meetings: 1 to 4 times per year  . Marital Status: Never married   Family History  Problem Relation Age of Onset  . Breast cancer Mother 76  . CAD Father 47   Scheduled Meds: . arformoterol  15 mcg Nebulization BID  . aspirin EC  325 mg Oral Daily  . atorvastatin  40 mg Oral Daily  . budesonide (PULMICORT) nebulizer solution  0.25 mg Nebulization BID  . chlorhexidine  15 mL Mouth Rinse BID  . Chlorhexidine Gluconate Cloth  6 each Topical Daily  . clopidogrel  75 mg Oral Daily  . cyproheptadine  2 mg Oral TID  . diltiazem  120 mg Oral Daily  . escitalopram  10 mg Oral Daily  . feeding supplement (ENSURE ENLIVE)  237 mL Oral TID BM  . folic acid  1 mg Oral Daily  . ipratropium-albuterol  3 mL Nebulization BID  . lisinopril  5 mg Oral Daily  . mouth rinse  15 mL Mouth Rinse q12n4p  . multivitamin with minerals  1 tablet Oral Daily  . OLANZapine  1.25 mg Oral QHS  . thiamine  100 mg Oral Daily   Continuous Infusions: . sodium chloride Stopped (02/29/20 0526)  . heparin 1,100 Units/hr (02/17/2020 0600)  . lactated ringers 75 mL/hr at 03/10/2020 0600   PRN Meds:.sodium chloride, acetaminophen **OR** acetaminophen, hydrALAZINE, ipratropium-albuterol, labetalol, ondansetron **OR** ondansetron (ZOFRAN) IV, oxyCODONE, promethazine, senna-docusate, traZODone Medications Prior to Admission:  Prior to Admission medications   Medication Sig Start Date End Date  Taking? Authorizing Provider  acetaminophen (TYLENOL) 325 MG tablet Take 2 tablets (650 mg total) by mouth every 6 (six) hours. 02/16/20   Love, Ivan Anchors, PA-C  aspirin 325 MG tablet Take 1 tablet (325 mg total) by mouth daily. 02/17/20   Love, Ivan Anchors, PA-C  atorvastatin (LIPITOR) 40 MG tablet Take 1 tablet (40 mg total) by mouth daily. 02/17/20   Love, Ivan Anchors, PA-C  cetirizine (ZYRTEC) 10 MG tablet Take 10 mg by mouth daily as needed  for allergies.     [provider]  clopidogrel (PLAVIX) 75 MG tablet Take 1 tablet (75 mg total) by mouth daily. 02/17/20   Love, Ivan Anchors, PA-C  diltiazem (CARDIZEM) 30 MG tablet Take 1 tablet (30 mg total) by mouth every 6 (six) hours. 02/16/20   Love, Ivan Anchors, PA-C  escitalopram (LEXAPRO) 10 MG tablet Take 1 tablet (10 mg total) by mouth daily. 02/17/20   Love, Ivan Anchors, PA-C  Fluticasone-Salmeterol (ADVAIR) 250-50 MCG/DOSE AEPB Inhale 1 puff into the lungs 2 (two) times daily. Patient not taking: Reported on 11/03/2016 11/25/14   Gladstone Lighter, MD  folic acid (FOLVITE) 1 MG tablet Take 1 tablet (1 mg total) by mouth daily. 02/17/20   Love, Ivan Anchors, PA-C  lidocaine (LIDODERM) 5 % Place 1 patch onto the skin daily. Apply at 8 am and remove at 8 pm daily. Purchase this over the counter 02/16/20   Love, Ivan Anchors, PA-C  lisinopril (ZESTRIL) 2.5 MG tablet Take 1 tablet (2.5 mg total) by mouth daily. 02/17/20   Love, Ivan Anchors, PA-C  megestrol (MEGACE) 400 MG/10ML suspension Take 10 mLs (400 mg total) by mouth 2 (two) times daily. 02/16/20   Love, Ivan Anchors, PA-C  Menthol-Methyl Salicylate (MUSCLE RUB) 10-15 % CREA Apply 1 application topically 2 (two) times daily as needed for muscle pain. To right thigh 02/16/20   Love, Ivan Anchors, PA-C  Multiple Vitamin (MULTIVITAMIN WITH MINERALS) TABS tablet Take 1 tablet by mouth daily. 02/17/20   Love, Ivan Anchors, PA-C  thiamine 100 MG tablet Take 1 tablet (100 mg total) by mouth daily. 02/17/20   Love, Ivan Anchors, PA-C  traZODone (DESYREL) 50 MG  tablet Take 0.5-1 tablets (25-50 mg total) by mouth at bedtime as needed for sleep. 02/16/20   Love, Ivan Anchors, PA-C   No Known Allergies Review of Systems  Respiratory: Positive for shortness of breath.     Physical Exam Pulmonary:     Comments: On BIPAP. Neurological:     Mental Status: She is alert.     Vital Signs: BP 121/84 (BP Location: Left Arm)   Pulse (!) 115   Temp 98 F (36.7 C) (Axillary)   Resp 15   Ht 5\' 8"  (1.727 m)   Wt 82.5 kg   SpO2 99%   BMI 27.65 kg/m  Pain Scale: 0-10   Pain Score: 0-No pain   SpO2: SpO2: 99 % O2 Device:SpO2: 99 % O2 Flow Rate: .O2 Flow Rate (L/min): 4 L/min  IO: Intake/output summary:   Intake/Output Summary (Last 24 hours) at 03/08/2020 0814 Last data filed at 02/23/2020 0600 Gross per 24 hour  Intake 768.83 ml  Output 1950 ml  Net -1181.17 ml    LBM: Last BM Date: 02/15/20 Baseline Weight: Weight: 68 kg Most recent weight: Weight: 82.5 kg     Palliative Assessment/Data:     Time In: 3:30 Time Out: 4:40 Time Total: 70 min Greater than 50%  of this time was spent counseling and coordinating care related to the above assessment and plan.  Signed by: Asencion Gowda, NP   Please contact Palliative Medicine Team phone at [phone] for questions and concerns.  For individual provider: See Shea Evans

## 2020-03-02 NOTE — Progress Notes (Signed)
Patient ID: Anne [name], female   DOB: 1961-06-22, 59 y.o.   MRN: [id number]   Brief Entry Dr. Janese Banks   I again tried the daughter  Voice mail is full   Will try again.   Need collateral from her and family meeting  Rama Anne Fu MD

## 2020-03-02 NOTE — Progress Notes (Signed)
CHIEF COMPLAINT:   Chief Complaint  Patient presents with  . Abdominal Pain    CT chest Acute pulmonary emboli involving right and left pulmonary arteries and multiple lobar and segmental branches. There is also some involvement of the main pulmonary artery bifurcation reflecting saddle embolus.  Subjective  On biPAP +resp distress +PE     Review of Systems: Limited ROS  Objective   Examination:  General exam: +resp distress on biPAP Respiratory system: Clear to auscultation. Respiratory effort normal. HEENT: [place]/AT, PERRLA, no thrush, no stridor. Cardiovascular system: S1 & S2 heard, RRR. No JVD, murmurs, rubs, gallops or clicks. No pedal edema. Gastrointestinal system: Abdomen is nondistended, soft and nontender. No organomegaly or masses felt. Normal bowel sounds heard. Central nervous system: Alert and oriented. No focal neurological deficits. Extremities: Symmetric 5 x 5 power. Skin: No rashes, lesions or ulcers Psychiatry: Judgement and insight appear normal. Mood & affect appropriate.   VITALS:  height is 5\' 8"  (1.727 m) and weight is 82.5 kg. Her axillary temperature is 96.8 F (36 C) (abnormal). Her blood pressure is 135/91 (abnormal) and her pulse is 123 (abnormal). Her respiration is 21 (abnormal) and oxygen saturation is 99%.   I personally reviewed Labs under Results section.  Radiology Reports CT ANGIO CHEST PE W OR WO CONTRAST  Result Date: 03/01/2020 CLINICAL DATA:  Respiratory failure, PE suspected EXAM: CT ANGIOGRAPHY CHEST WITH CONTRAST TECHNIQUE: Multidetector CT imaging of the chest was performed using the standard protocol during bolus administration of intravenous contrast. Multiplanar CT image reconstructions and MIPs were obtained to evaluate the vascular anatomy. CONTRAST:  26mL OMNIPAQUE IOHEXOL 350 MG/ML SOLN COMPARISON:  None. FINDINGS: Cardiovascular: Multifocal acute pulmonary emboli present. There is involvement of the right and left  pulmonary arteries extending into lobar and segmental branches. Centrally, there is involvement of the main pulmonary artery bifurcation. Normal heart size without evidence of right heart strain. Mediastinum/Nodes: No enlarged lymph nodes. Visualized thyroid is unremarkable. Lungs/Pleura: Centrilobular emphysema. There is atelectasis at the right lung base. Additional small opacity at the right lung base may reflect atelectasis or less likely infarct. No pleural effusion or pneumothorax. Upper Abdomen: No acute abnormality. Partially imaged left adrenal mass measuring 3.3 cm. This has increased in size in comparison to 2015 presumably reflecting a slow-growing adenoma. Musculoskeletal: No acute osseous abnormality. Review of the MIP images confirms the above findings. IMPRESSION: Acute pulmonary emboli involving right and left pulmonary arteries and multiple lobar and segmental branches. There is also some involvement of the main pulmonary artery bifurcation reflecting saddle embolus. However, there is no imaging evidence of right heart strain. These results were called by telephone at the time of interpretation on 03/01/2020 at 3:04 pm to provider [facility] , who verbally acknowledged these results. Electronically Signed   By: Macy Mis M.D.   On: 03/01/2020 15:07   DG Chest Port 1 View  Result Date: 03/01/2020 CLINICAL DATA:  Hypoxia. EXAM: PORTABLE CHEST 1 VIEW COMPARISON:  Chest x-ray 02/25/2020, 01/04/2020.  CT 05/22/2019. FINDINGS: Mediastinum and hilar structures normal. Heart size stable. Bilateral interstitial prominence again noted, right side greater than left. Right base atelectasis with elevation right hemidiaphragm. Small right pleural effusion cannot be excluded. Thoracolumbar spine scoliosis. IMPRESSION: Bilateral interstitial prominence again noted, right side greater than left. Right base atelectasis with elevation of the right hemidiaphragm. Small right pleural effusion cannot be  excluded. Electronically Signed   By: Marcello Moores  Register   On: 03/01/2020 12:15  Assessment/Plan:  Acute severe hypoxic resp failure from PE Continue biPAP Oxygen as needed Continue Heparin DNR/DNI Follow up vasc surgery  Prognosis is very poor May need hospice care     Corrin Parker, M.D.  [facility]monary [facility] Medicine  Medical Director [place] Director [facility] Cardio-Pulmonary Department

## 2020-03-02 NOTE — Progress Notes (Signed)
ANTICOAGULATION CONSULT NOTE   Pharmacy Consult for Heparin infusoin Indication: pulmonary embolus  No Known Allergies  Patient Measurements: Height: 5\' 8"  (172.7 cm) Weight: 82.5 kg (181 lb 14.1 oz) IBW/kg (Calculated) : 63.9 Heparin Dosing Weight: 80.5 kg  Vital Signs: Temp: 96.8 F (36 C) (08/18 1200) Temp Source: Axillary (08/18 1200) BP: 83/57 (08/18 1300) Pulse Rate: 134 (08/18 1300)  Labs: Recent Labs    02/28/20 2125 02/29/20 0028 02/29/20 0441 02/29/20 0441 02/29/20 1411 03/01/20 0544 03/01/20 1338 03/01/20 2041 03/01/2020 0604  HGB  --   --  15.1*   < >  --  15.4*  --   --  16.0*  HCT  --   --  47.2*  --   --  48.3*  --   --  49.9*  PLT  --   --  263  --   --  238  --   --  206  APTT  --   --   --   --   --   --  26  --   --   LABPROT  --   --   --   --   --   --  16.2*  --   --   INR  --   --   --   --   --   --  1.4*  --   --   HEPARINUNFRC  --   --   --   --   --   --   --  1.57* 1.41*  CREATININE  --   --  0.61  --   --   --   --   --  1.19*  TROPONINIHS 31* 24*  --   --  40*  --   --   --   --    < > = values in this interval not displayed.    Estimated Creatinine Clearance: 56.6 mL/min (A) (by C-G formula based on SCr of 1.19 mg/dL (H)).   Medical History: Past Medical History:  Diagnosis Date  . Asthma   . CAD (coronary artery disease)    a. cardiac cath 02/04/2013: tubular 20% stenosis pRCA, tubular 30% stenosis mRCA, medically managed  . Coronary vasospasm (HCC)    a. cardiac cath 11/25/2014: mLCx 30%, pRCA 30%, moderate spasm w/ noted improvement w/ reimaging, recommended CCB, long acting nitrate, smoking cessation, & statin   . Hypertension   . Polysubstance abuse ([place])    a. remote history of crack/cocaine abuse approximately 8-10 years ago, ongoing tobacco abuse since age 71, rare etoh abuse  . Stroke ([place])   . Tubular adenoma      Assessment: Pharmacy has been consulted for heparin dosing and monitoring for a 59yo female for high  clinical suspicion of PE. PMH significant for acute ischemic CVA (12/31/19), CAD, HTN. Patient was previously on prophylactic dosing of Lovenox. Per chart review, patient has no anticoagulation prior to admission.   CT PE with acute pulmonary emboli involving right and left pulmonary arteries and multiple lobar and segmental branches. Some involvement of the main pulmonary artery bifurcation reflecting saddle embolus.  Thrombectomy with Vascular pending.  Goal of Therapy:  Heparin level 0.3-0.7 units/ml Monitor platelets by anticoagulation protocol: Yes   Plan:  8/18 0604 HL 1.41 supratherapeutic. Heparin held for approximately 2 hours. Restarted at 900 units/hr ~ 1200. HL ordered for 1900. CBC daily with morning labs.  Tawnya Crook, PharmD 02/28/2020 1:36 PM

## 2020-03-02 NOTE — Patient Outreach (Signed)
[facility]) Care Management  02/14/2020  Anne [name] 03-10-1961 [id number]   CSW made contact with pt's daughter on 03/01/2020 who reports her mother remains in the hospital [facility]).  "She is doing great- she has no will power". Per daughter, they are going to have someone see pt (for ?depression).  Daughter also upset that she is not allowed to visit because of COVID restrictions and rules- CSW encouraged her to speak to hospital staff to see if they can make an exception given her mental state.   CSW will continue to follow along for updates and support.  Eduard Clos, MSW, [place] Worker  [place] [phone]

## 2020-03-03 DIAGNOSIS — I2699 Other pulmonary embolism without acute cor pulmonale: Secondary | ICD-10-CM

## 2020-03-03 DIAGNOSIS — I25118 Atherosclerotic heart disease of native coronary artery with other forms of angina pectoris: Secondary | ICD-10-CM

## 2020-03-03 NOTE — Progress Notes (Signed)
PROGRESS NOTE    Anne [name]  NWG:[id number] DOB: July 18, 1960 DOA: 02/15/2020 PCP: Donnie Coffin, MD    Brief Narrative: HPI: Anne [name] is a 59 y.o. female with medical history significant of CVA, hypertension, coronary artery disease presents the emergency department for evaluation of abdominal pain associated with weakness and cough.  The patient is post CVA thus little difficult to understand this majority the history is gained by speaking with the ED physician in reading the associated documentation.  Patient reports she is here because she felt weak and more short of breath throughout the day.  No other obvious infectious contacts.  No known sick contacts.  No history of fevers.  No other associated symptoms.  Unable to fully characterize pain.  On my evaluation patient is resting in bed.  She appears chronically ill.  Is difficult to communicate with her secondary to CVA.  However she does understand and nods head yes or no to interview questions.  For the abdominal pain etiology is unclear.  She had a CT abdomen done in the emergency department that did not reveal any acute etiology.  Chest imaging was significant for interstitial edema.  Working diagnosis is acute hypoxic respiratory failure secondary to concomitant COPD and CHF.  8/12: Patient is much more awake today.  Communicative.  No slurred speech.  Remains on 3 to 4 L nasal cannula.  8/13: Patient seen and examined.  A little bit lethargic but respiratory status appears improved.  Now on 3 L last nasal cannula.  Saturating adequately.  8/14: Patient seen and examined.  More lethargic this morning.  No change in saturations.  Continues to require 3 L nasal cannula.  Breath sounds improving.  Per bedside RN patient was unable to sleep well last night.  8/15: Patient seen and examined.  Remains very lethargic.  Oxygen status actually improving.  Titrated down to 2 L nasal cannula.  Breath sounds also improving.  Patient seems  almost catatonic.  She does open eyes but very limited participation in interview.  I related that her oral intake has been very poor and that if she continues to not eat food after consider a Dobbhoff and initiation of tube feeds.  I also asked her about depressive symptoms which she did endorse.  I have called consultation with psychiatry.  8/16: Patient seen and examined.  Remains lethargic but improved over interval.  Breath sounds improving.  Oxygen status stabilizing.  Oral intake remains poor.  Psychiatric consultation called.  Discussed with Dr. Janese Banks.  Formal consultation note pending.  He was able to get a hold of family yesterday to discuss her presentation prior to starting any pharmacologic agents.  03/09/2020 -patient was seen and examined it appears the patient has been progressively deteriorating, lethargic not engaging in conversation -CT scan revealed pulmonary embolism.  Patient started on heparin drip-vascular surgery consulted Patient is confirmed DNR/DNI -palliative care will be consulted   Addendum: Family present at bedside.  Discussed with critical care team: Dr. Mortimer Fries.  Vascular team has offered thrombectomy. Patient and family has refused any further intervention including BiPAP -they have verbalized to Dr. Mortimer Fries for comfort care measures.  Orders has been placed by  PCCM critical  Met with the family at bedside confirmed comfort care measures...   03/03/2020-patient will remain under comfort care measures  Assessment & Plan:   Principal Problem:   Acute pulmonary embolism ([place]) Active Problems:   Acute hypoxemic respiratory failure (HCC)   CAD (coronary artery disease)  Hypertension   Coronary artery disease of native artery of native heart with stable angina pectoris (HCC)   Hemiparesis affecting left side as late effect of stroke (HCC)   Sepsis ([place])  Acute hypoxemic respiratory failure -exacerbated by acute on chronic D CHF/COPD exacerbation/possible new PE -CT  angiogram of the chest >> reported acute pulmonary emboli involving the right and left pulmonary artery multiple lobular segmental emboli --possible saddle emboli -Patient has been initiated on heparin drip -Pulmonary critical care following closely  -Remain on BiPAP satting 92% >>>> off BiPAP now per family request -Vascular surgery consulted -recommended embolectomy patient's family has declined- Also patient would not be able to tolerate procedure  Palliative care team consulted-following   Acute decompensated heart failure and concomitant COPD -PCCM following,  - Patient did not appear markedly hypervolemic.  Believe that majority of symptoms driven by underlying COPD. 2d echo 01/01/20: normal EF.  Grade 2 diastolic dysfunction  Continue bronchodilator therapy  Last dose of antibiotic 8/17 Worsening leukocytosis 13.9--19.2 today (anticipating restarting empiric antibiotics) Hold Lasix Continue home lisinopril Target saturation 88 to 92%--back on BiPAP  Depression Continue Lexapro, Zyprexa, thiamine -Palliative care consulted to determine goal of care -Discussed with her daughter in detail  Acute kidney injury, resolved Suspect prerenal azotemia in the setting of diuresis -We will continue to hold Lasix, Lisinopril restarted  Avoid nephrotoxins  Coronary artery disease History of CVA Continue dual antiplatelet therapy  Hypertension Lisinopril restarted Continue home diltiazem As needed hydralazine  Suspected malnutrition RD consult Continue Megace  Hyperlipidemia Continue home Lipitor  DVT prophylaxis: Lovenox Code Status: Full Family Communication:  daughter [name] via phone [phone] on 03/07/2020  Met with the family and daughter present at bedside-confirmed pursuing comfort care measures.  Disposition Plan: Status is: Inpatient  Remains inpatient appropriate because:Inpatient level of care appropriate due to severity of illness   Dispo: The  patient is from: Home              Anticipated d/c is to: SNF              Anticipated d/c date is: 3 days              Patient currently is not medically stable to d/c.   Patient is improved from respiratory standpoint.  Still with a small oxygen requirement.  IV feeling we can titrate this off within 24 hours.  Patient still with adult failure to thrive and poor nutrition.  Consultation: Psychiatry.  Formal recommendations pending.  Consultation called with palliative care.  Appreciate recommendations.     Consultants:  PCCM General surgery Vascular surgery Palliative care team  Procedures:   Possible thrombectomy  Antimicrobials:   Levaquin   Subjective: Patient seen and examined.  A little bit more awake today.  Again endorses weakness.  Not much participation in interview.  Objective: Vitals:   03/14/2020 1700 03/06/2020 1800 03/03/20 0444 03/03/20 0900  BP:   (!) 84/27 93/60  Pulse: (!) 139 (!) 133 (!) 131 (!) 131  Resp: (!) _0 Temp:   98.2 F (36.8 C) (!) 97.5 F (36.4 C)  TempSrc:   Axillary Axillary  SpO2: (!) 84% (!) 84% (!) 84% (!) 82%  Weight:      Height:        Intake/Output Summary (Last 24 hours) at 03/03/2020 1300 Last data filed at 03/03/2020 1600 Gross per 24 hour  Intake 96.45 ml  Output --  Net 96.45 ml   Danley Danker  Weights   03/01/20 0500 03/01/20 1209 02/26/2020 0158  Weight: 82 kg 82 kg 82.5 kg    Examination:   Remained lethargic,  unresponsive opens her eyes to pain stimuli No changes to physical exam General: Lethargic HEENT: Normocephalic, atraumatic Neck, supple negative JVD, Heart: Regular rate and rhythm-episodic tachycardia Lungs: Negative wheezes crackles, on 2 L of oxygen, diffuse rhonchi  Abdomen: Soft, nontender, nondistended, positive bowel sounds Extremities: Normal, atraumatic, no clubbing or cyanosis,  Skin: No rashes or lesions, normal color Neurologic: No new focal neurological findings, remained lethargic not  oriented x1  right-sided deficit consistent with prior stroke. Psychiatric: Flattened affect -not engaging in conversation on physical exam      Data Reviewed: I have personally reviewed following labs and imaging studies  CBC: Recent Labs  Lab 02/27/20 0804 02/28/20 0603 02/29/20 0441 03/01/20 0544 03/05/2020 0604  WBC 18.1* 16.4* 18.0* 13.9* 19.2*  NEUTROABS 15.1* 13.2* 13.5* 9.4* 13.2*  HGB 14.5 14.9 15.1* 15.4* 16.0*  HCT 43.1 44.9 47.2* 48.3* 49.9*  MCV 83.7 84.7 87.4 88.3 88.3  PLT 253 257 263 238 206   Basic Metabolic Panel: Recent Labs  Lab 02/26/20 0417 02/27/20 0804 02/28/20 0603 02/29/20 0441 03/06/2020 0604  NA 133* 138 143 142 148*  K 3.9 4.3 4.2 4.0 4.1  CL 101 103 107 108 114*  CO2 20* 23 24 21* 22  GLUCOSE 117* 121* 110* 104* 113*  BUN 44* 40* 32* 30* 48*  CREATININE 1.36* 0.86 0.74 0.61 1.19*  CALCIUM 11.0* 11.4* 11.5* 11.5* 11.7*  MG  --   --   --   --  2.6*  PHOS  --   --   --   --  4.7*   GFR: Estimated Creatinine Clearance: 56.6 mL/min (A) (by C-G formula based on SCr of 1.19 mg/dL (H)). Liver Function Tests: No results for input(s): AST, ALT, ALKPHOS, BILITOT, PROT, ALBUMIN in the last 168 hours. No results for input(s): LIPASE, AMYLASE in the last 168 hours. No results for input(s): AMMONIA in the last 168 hours. Coagulation Profile: Recent Labs  Lab 03/01/20 1338  INR 1.4*   Cardiac Enzymes: No results for input(s): CKTOTAL, CKMB, CKMBINDEX, TROPONINI in the last 168 hours. BNP (last 3 results) No results for input(s): PROBNP in the last 8760 hours. HbA1C: No results for input(s): HGBA1C in the last 72 hours. CBG: Recent Labs  Lab 03/01/20 1131 03/01/20 1157  GLUCAP 113* 97   Lipid Profile: No results for input(s): CHOL, HDL, LDLCALC, TRIG, CHOLHDL, LDLDIRECT in the last 72 hours. Thyroid Function Tests: No results for input(s): TSH, T4TOTAL, FREET4, T3FREE, THYROIDAB in the last 72 hours. Anemia Panel: No results for  input(s): VITAMINB12, FOLATE, FERRITIN, TIBC, IRON, RETICCTPCT in the last 72 hours. Sepsis Labs: No results for input(s): PROCALCITON, LATICACIDVEN in the last 168 hours.  Recent Results (from the past 240 hour(s))  SARS Coronavirus 2 by RT PCR (hospital order, performed in [facility] hospital lab) Nasopharyngeal Nasopharyngeal Swab     Status: None   Collection Time: 03/07/2020  1:22 PM   Specimen: Nasopharyngeal Swab  Result Value Ref Range Status   SARS Coronavirus 2 NEGATIVE NEGATIVE Final    Comment: (NOTE) SARS-CoV-2 target nucleic acids are NOT DETECTED.  The SARS-CoV-2 RNA is generally detectable in upper and lower respiratory specimens during the acute phase of infection. The lowest concentration of SARS-CoV-2 viral copies this assay can detect is 250 copies / mL. A negative result does not preclude SARS-CoV-2 infection  and should not be used as the sole basis for treatment or other patient management decisions.  A negative result may occur with improper specimen collection / handling, submission of specimen other than nasopharyngeal swab, presence of viral mutation(s) within the areas targeted by this assay, and inadequate number of viral copies (<250 copies / mL). A negative result must be combined with clinical observations, patient history, and epidemiological information.  Fact Sheet for Patients:   StrictlyIdeas.no  Fact Sheet for Healthcare Providers: BankingDealers.co.za  This test is not yet approved or  cleared by the [place] FDA and has been authorized for detection and/or diagnosis of SARS-CoV-2 by FDA under an Emergency Use Authorization (EUA).  This EUA will remain in effect (meaning this test can be used) for the duration of the COVID-19 declaration under Section 564(b)(1) of the Act, 21 U.S.C. section 360bbb-3(b)(1), unless the authorization is terminated or revoked sooner.  Performed at [facility], [address] [place], [place] [address]   MRSA PCR Screening     Status: None   Collection Time: 03/01/20 12:11 PM   Specimen: Nasal Mucosa; Nasopharyngeal  Result Value Ref Range Status   MRSA by PCR NEGATIVE NEGATIVE Final    Comment:        The GeneXpert MRSA Assay (FDA approved for NASAL specimens only), is one component of a comprehensive MRSA colonization surveillance program. It is not intended to diagnose MRSA infection nor to guide or monitor treatment for MRSA infections. Performed at [facility], [address] [place], [place] [address]          Radiology Studies: CT ANGIO CHEST PE W OR WO CONTRAST  Result Date: 03/01/2020 CLINICAL DATA:  Respiratory failure, PE suspected EXAM: CT ANGIOGRAPHY CHEST WITH CONTRAST TECHNIQUE: Multidetector CT imaging of the chest was performed using the standard protocol during bolus administration of intravenous contrast. Multiplanar CT image reconstructions and MIPs were obtained to evaluate the vascular anatomy. CONTRAST:  53m OMNIPAQUE IOHEXOL 350 MG/ML SOLN COMPARISON:  None. FINDINGS: Cardiovascular: Multifocal acute pulmonary emboli present. There is involvement of the right and left pulmonary arteries extending into lobar and segmental branches. Centrally, there is involvement of the main pulmonary artery bifurcation. Normal heart size without evidence of right heart strain. Mediastinum/Nodes: No enlarged lymph nodes. Visualized thyroid is unremarkable. Lungs/Pleura: Centrilobular emphysema. There is atelectasis at the right lung base. Additional small opacity at the right lung base may reflect atelectasis or less likely infarct. No pleural effusion or pneumothorax. Upper Abdomen: No acute abnormality. Partially imaged left adrenal mass measuring 3.3 cm. This has increased in size in comparison to 2015 presumably reflecting a slow-growing adenoma. Musculoskeletal: No acute osseous abnormality. Review of the  MIP images confirms the above findings. IMPRESSION: Acute pulmonary emboli involving right and left pulmonary arteries and multiple lobar and segmental branches. There is also some involvement of the main pulmonary artery bifurcation reflecting saddle embolus. However, there is no imaging evidence of right heart strain. These results were called by telephone at the time of interpretation on 03/01/2020 at 3:04 pm to provider [facility], who verbally acknowledged these results. Electronically Signed   By: PMacy MisM.D.   On: 03/01/2020 15:07   ECHOCARDIOGRAM COMPLETE  Result Date: 03/13/2020    ECHOCARDIOGRAM REPORT   Patient Name:   Anne LUEPKEDate of Exam: 03/01/2020 Medical Rec #:  [id number]     Height:       68.0 in Accession #:  [id number]     Weight:       180.8 lb Date of Birth:  1960-08-02      BSA:          1.958 m Patient Age:    60 years       BP:           141/89 mmHg Patient Gender: F              HR:           133 bpm. Exam Location:  [facility] Procedure: 2D Echo, Cardiac Doppler and Color Doppler Indications:     I26.99 Pulmnary embolus  History:         Patient has prior history of Echocardiogram examinations, most                  recent 01/01/2020. Risk Factors:Hypertension and Remote                  Polysubstance abuse. Stroke. Coronary artery disease. Asthma.  Sonographer:     Wilford Sports Rodgers-Jones Referring Phys:  [id number] Sidney Ace Diagnosing Phys: Yolonda Kida MD  Sonographer Comments: Technically difficult study due to poor echo windows. IMPRESSIONS  1. Left ventricular ejection fraction, by estimation, is 55 to 60%. The left ventricle has normal function. The left ventricle has no regional wall motion abnormalities. Left ventricular diastolic parameters are consistent with Grade I diastolic dysfunction (impaired relaxation).  2. Right ventricular systolic function is normal. The right ventricular size is normal.  3. The mitral valve is grossly normal. Trivial  mitral valve regurgitation.  4. The aortic valve is grossly normal. Aortic valve regurgitation is not visualized. FINDINGS  Left Ventricle: Left ventricular ejection fraction, by estimation, is 55 to 60%. The left ventricle has normal function. The left ventricle has no regional wall motion abnormalities. The left ventricular internal cavity size was normal in size. There is  no left ventricular hypertrophy. Left ventricular diastolic parameters are consistent with Grade I diastolic dysfunction (impaired relaxation). Right Ventricle: The right ventricular size is normal. No increase in right ventricular wall thickness. Right ventricular systolic function is normal. Left Atrium: Left atrial size was normal in size. Right Atrium: Right atrial size was normal in size. Pericardium: There is no evidence of pericardial effusion. Mitral Valve: The mitral valve is grossly normal. Trivial mitral valve regurgitation. Tricuspid Valve: The tricuspid valve is normal in structure. Tricuspid valve regurgitation is mild. Aortic Valve: The aortic valve is grossly normal. Aortic valve regurgitation is not visualized. Aortic regurgitation PHT measures 297 msec. Pulmonic Valve: The pulmonic valve was normal in structure. Pulmonic valve regurgitation is not visualized. Aorta: The aortic root is normal in size and structure. IAS/Shunts: No atrial level shunt detected by color flow Doppler.  LEFT VENTRICLE PLAX 2D LVIDd:         3.04 cm  Diastology LVIDs:         2.20 cm  LV e' lateral:   4.03 cm/s LV PW:         0.88 cm  LV E/e' lateral: 13.7 LV IVS:        1.25 cm  LV e' medial:    3.81 cm/s LVOT diam:     1.80 cm  LV E/e' medial:  14.5 LVOT Area:     2.54 cm  RIGHT VENTRICLE RV Basal diam:  3.87 cm RV S prime:     11.10 cm/s LEFT ATRIUM  Index      RIGHT ATRIUM          Index LA diam:      3.00 cm 1.53 cm/m RA Area:     8.51 cm LA Vol (A2C): 17.4 ml 8.89 ml/m RA Volume:   18.10 ml 9.24 ml/m LA Vol (A4C): 9.5 ml  4.84  ml/m  AORTIC VALVE AI PHT:      297 msec  AORTA Ao Root diam: 2.80 cm MITRAL VALVE MV Area (PHT): 5.97 cm    SHUNTS MV Decel Time: 127 msec    Systemic Diam: 1.80 cm MV E velocity: 55.30 cm/s MV A velocity: 77.60 cm/s MV E/A ratio:  0.71 Dwayne D Callwood MD Electronically signed by Yolonda Kida MD Signature Date/Time: 03/07/2020/5:46:59 PM    Final         Scheduled Meds:  Continuous Infusions: . sodium chloride Stopped (02/29/20 0526)     LOS: 8 days    Time spent: 25 minutes    Deatra James, MD [facility] Please feel free to use secure chat-and text via needed  If 7PM-7AM, please contact night-coverage 03/03/2020, 1:00 PM

## 2020-03-07 ENCOUNTER — Ambulatory Visit: Payer: Self-pay | Admitting: *Deleted

## 2020-03-07 ENCOUNTER — Other Ambulatory Visit: Payer: Self-pay | Admitting: *Deleted

## 2020-03-07 NOTE — Patient Outreach (Signed)
[place] [facility]) Care Management  03/07/2020  Emmry Cansler [birth year] [id number]   [facility] Case closure Pt deceased  Successful outreach to Daughter [name] Condolences offered on behalf of [facility] for the loss of Ms Sachdeva  [name] shared that she was able to visit Ms Apostol prior to her passing [name] expressed how much it meant to her to have had this time with Ms Nolting during the comfort care process to spend time at Ms Fussner's bedside discussing Ms Haughn final wishes  Plan [facility] RN CM will close case  with comfort care measures patient subsequently expired on March 09, 2020 time Oct 10, 2016 -was pronounced by nurse  Final/Principal Diagnoses:  1.   Acute pulmonary embolism 2.  Respiratory failure due to pulmonary embolism, dCHF, COPD exacerbation   Case closure letters sent to PCP - note faxed to MDs   Joelene Millin L. [name], RN, BSN, [place] Coordinator Office number [phone] Mobile number [phone]  Main [facility] number [phone] Fax number [phone]

## 2020-03-08 ENCOUNTER — Other Ambulatory Visit: Payer: Self-pay | Admitting: *Deleted

## 2020-03-08 ENCOUNTER — Encounter: Payer: Medicaid Other | Admitting: Physical Medicine & Rehabilitation

## 2020-03-08 LAB — BLOOD GAS, ARTERIAL
Acid-Base Excess: 1.6 mmol/L (ref 0.0–2.0)
Bicarbonate: 24.7 mmol/L (ref 20.0–28.0)
Delivery systems: POSITIVE
Expiratory PAP: 10
FIO2: 1
Inspiratory PAP: 12
O2 Saturation: 89.8 %
Patient temperature: 37
pCO2 arterial: 34 mmHg (ref 32.0–48.0)
pH, Arterial: 7.47 — ABNORMAL HIGH (ref 7.350–7.450)
pO2, Arterial: 54 mmHg — ABNORMAL LOW (ref 83.0–108.0)

## 2020-03-08 NOTE — Patient Outreach (Signed)
[place] [facility]) Care Management  03/08/2020  Anne [name] 01/01/1961 [id number]   CSW alerted to pt's death. CSW will plan to close CSW referral and offer condolences to the family.   Eduard Clos, MSW, [place] Worker  [place] [phone]

## 2020-03-09 ENCOUNTER — Other Ambulatory Visit: Payer: Self-pay | Admitting: *Deleted

## 2020-03-09 ENCOUNTER — Ambulatory Visit: Payer: Self-pay | Admitting: *Deleted

## 2020-03-09 NOTE — Patient Outreach (Signed)
[place] [facility]) Care Management  03/09/2020  Solita Gravois Dec 31, 1960 [id number]   [place] coordination- home health DME pick up  [place] called and spoke with Olin Hauser at [facility] about [name]'s request for DME pick up  Verified the date of Ms Pinsix passing, the address and [name]'s number for outreach with Raynelle Fanning L. [name], RN, BSN, [place] Coordinator Office number [phone] Mobile number [phone]  Main [facility] number [phone] Fax number [phone]

## 2020-03-15 NOTE — Progress Notes (Signed)
Inpatient Rehabilitation Care Coordinator  Discharge Note  The overall goal for the admission was met for:   Discharge location: Yes. D/c to home with support from her dtr.   Length of Stay: Yes. 38 days.   Discharge activity level: Yes. Max A.   Home/community participation: Yes. Limited.  Services provided included: MD, RD, PT, OT, SLP, RN, CM, TR, Pharmacy, Neuropsych and SW  Financial Services: Medicaid  Follow-up services arranged: SW unable to establish home health by time of discharge. SW will continue to explore options.   Comments (or additional information): contact pt dtr [name] [phone]  Patient/Family verbalized understanding of follow-up arrangements: Yes  Individual responsible for coordination of the follow-up plan: Pt to have assistance with coordinating care needs.   Confirmed correct DME delivered: Rana Snare 03/15/2020    Rana Snare

## 2020-03-16 NOTE — Progress Notes (Signed)
Morphine 4mg  IV  given at this time per order for comfort care measures including dyspnea.

## 2020-03-16 NOTE — Progress Notes (Signed)
[place] [facility] notified of pt death. Eyes prepped and body taken to morgue.

## 2020-03-16 NOTE — Discharge Summary (Signed)
Death Summary  Anne [name] JFH:[id number] DOB: Sep 09, 1960 DOA: 2020/03/18  PCP: Donnie Coffin, MD  Admit date: 03/18/20 Date of Death: March 27, 2020 Time of Death: 2016/09/19 Notification: Donnie Coffin, MD notified of death of March 30, 2020   History of present illness:  Anne [name] is a 59 y.o. female with a history of as mentioned below. Anne [name] presented with complaint of shortness of breath, respiratory failure Anne [name] did not improve after initial aggressive treatment (very brief description of intervention)  Code Status= DNR/DNI  HPI/brief Summary/Hospital Stay:    Anne Pinnixis a 59 y.o.femalewith medical history significant ofCVA, hypertension, coronary artery disease presents the emergency department for evaluation of abdominal pain associated with weakness and cough. The patient is post CVA thus little difficult to understand this majority the history is gained by speaking with the ED physician in reading the associated documentation. Patient reports she is here because she felt weak and more short of breath throughout the day. No other obvious infectious contacts. No known sick contacts. No history of fevers. No other associated symptoms. Unable to fully characterize pain.  On my evaluation patient is resting in bed. She appears chronically ill. Is difficult to communicate with her secondary to CVA. However she does understand and nods head yes or no to interview questions. For the abdominal pain etiology is unclear. She had a CT abdomen done in the emergency department that did not reveal any acute etiology. Chest imaging was significant for interstitial edema. Working diagnosis is acute hypoxic respiratory failure secondary to concomitant COPD and CHF.  8/12: Patient is much more awake today.  Communicative.  No slurred speech.  Remains on 3 to 4 L nasal cannula.  8/13: Patient seen and examined.  A little bit lethargic but respiratory status  appears improved.  Now on 3 L last nasal cannula.  Saturating adequately.  8/14: Patient seen and examined.  More lethargic this morning.  No change in saturations.  Continues to require 3 L nasal cannula.  Breath sounds improving.  Per bedside RN patient was unable to sleep well last night.  8/15: Patient seen and examined.  Remains very lethargic.  Oxygen status actually improving.  Titrated down to 2 L nasal cannula.  Breath sounds also improving.  Patient seems almost catatonic.  She does open eyes but very limited participation in interview.  I related that her oral intake has been very poor and that if she continues to not eat food after consider a Dobbhoff and initiation of tube feeds.  I also asked her about depressive symptoms which she did endorse.  I have called consultation with psychiatry.  8/16: Patient seen and examined.  Remains lethargic but improved over interval.  Breath sounds improving.  Oxygen status stabilizing.  Oral intake remains poor.  Psychiatric consultation called.  Discussed with Dr. Janese Banks.  Formal consultation note pending.  He was able to get a hold of family yesterday to discuss her presentation prior to starting any pharmacologic agents.  03/01/2020 -patient was seen and examined it appears the patient has been progressively deteriorating, lethargic not engaging in conversation -CT scan revealed pulmonary embolism.  Patient started on heparin drip-vascular surgery consulted Patient was confirmed DNR/DNI, palliative care,-pursued comfort care measures-hospice   Family present at bedside.  Discussed with critical care team: Dr. Mortimer Fries.  Vascular team has offered thrombectomy. Patient and family has refused any further intervention including BiPAP -they have verbalized to Dr. Mortimer Fries for comfort care measures.  Orders has been placed by  PCCM critical  Met with the family at bedside confirmed comfort care measures...   03/03/2020-patient will remain under comfort care  measures  04/04/2020 -patient remains unresponsive, tachycardic, opens her eyes to pain stimuli Hypotensive, hypoxic satting 65% with supplemental oxygen  Continue with comfort care measures Patient subsequently expired on 2020-03-21 time 09-13-16 -was pronounced by nurse     Final/Principal Diagnoses:  1.   Acute pulmonary embolism 2.  Respiratory failure due to pulmonary embolism, dCHF, COPD exacerbation  Disposition/Follow up Care: Patient is deceased.   Discharge medications: None  The results of significant diagnostics from this hospitalization (including imaging, microbiology, ancillary and laboratory) are listed below for reference.    Significant Diagnostic Studies: CT HEAD WO CONTRAST  Result Date: 02/27/2020 CLINICAL DATA:  Altered mental status. EXAM: CT HEAD WITHOUT CONTRAST TECHNIQUE: Contiguous axial images were obtained from the base of the skull through the vertex without intravenous contrast. COMPARISON:  December 31, 2019 FINDINGS: Brain: There is mild cerebral atrophy with widening of the extra-axial spaces and ventricular dilatation. There are areas of decreased attenuation within the white matter tracts of the supratentorial brain, consistent with microvascular disease changes. A chronic left basal ganglia lacunar infarct is noted. Small areas of cortical encephalomalacia, with adjacent chronic white matter low attenuation, are seen within the right parietal lobe. Vascular: No hyperdense vessel or unexpected calcification. Skull: Normal. Negative for fracture or focal lesion. Sinuses/Orbits: No acute finding. Other: None. IMPRESSION: 1. Generalized cerebral atrophy. 2. Chronic left basal ganglia lacunar infarct. 3. Small areas of cortical encephalomalacia, with adjacent chronic white matter low attenuation, are seen within the right parietal lobe which may represent sequelae associated with a chronic infarct. 4. No acute intracranial abnormality. Electronically Signed   By:  Virgina Norfolk M.D.   On: 02/27/2020 18:44   CT ANGIO CHEST PE W OR WO CONTRAST  Result Date: 03/01/2020 CLINICAL DATA:  Respiratory failure, PE suspected EXAM: CT ANGIOGRAPHY CHEST WITH CONTRAST TECHNIQUE: Multidetector CT imaging of the chest was performed using the standard protocol during bolus administration of intravenous contrast. Multiplanar CT image reconstructions and MIPs were obtained to evaluate the vascular anatomy. CONTRAST:  27m OMNIPAQUE IOHEXOL 350 MG/ML SOLN COMPARISON:  None. FINDINGS: Cardiovascular: Multifocal acute pulmonary emboli present. There is involvement of the right and left pulmonary arteries extending into lobar and segmental branches. Centrally, there is involvement of the main pulmonary artery bifurcation. Normal heart size without evidence of right heart strain. Mediastinum/Nodes: No enlarged lymph nodes. Visualized thyroid is unremarkable. Lungs/Pleura: Centrilobular emphysema. There is atelectasis at the right lung base. Additional small opacity at the right lung base may reflect atelectasis or less likely infarct. No pleural effusion or pneumothorax. Upper Abdomen: No acute abnormality. Partially imaged left adrenal mass measuring 3.3 cm. This has increased in size in comparison to 22015/03/01presumably reflecting a slow-growing adenoma. Musculoskeletal: No acute osseous abnormality. Review of the MIP images confirms the above findings. IMPRESSION: Acute pulmonary emboli involving right and left pulmonary arteries and multiple lobar and segmental branches. There is also some involvement of the main pulmonary artery bifurcation reflecting saddle embolus. However, there is no imaging evidence of right heart strain. These results were called by telephone at the time of interpretation on 03/01/2020 at 3:04 pm [facility] verbally acknowledged these results. Electronically Signed   By: PMacy MisM.D.   On: 03/01/2020 15:07   CT ABDOMEN PELVIS W  CONTRAST  Result Date: 03/14/2020 CLINICAL DATA:  General abdominal pain.  EXAM: CT ABDOMEN AND PELVIS WITH CONTRAST TECHNIQUE: Multidetector CT imaging of the abdomen and pelvis was performed using the standard protocol following bolus administration of intravenous contrast. CONTRAST:  17m OMNIPAQUE IOHEXOL 300 MG/ML  SOLN COMPARISON:  CT chest 05/22/2019 FINDINGS: Lower chest: Bibasilar interstitial opacities and trace effusions. Probable right greater than left atelectasis. Hepatobiliary: No focal liver abnormality is seen. No gallstones, gallbladder wall thickening, or biliary dilatation. Pancreas: Unremarkable. No pancreatic ductal dilatation or surrounding inflammatory changes. Spleen: Normal in size without focal abnormality. Adrenals/Urinary Tract: Stable left 3.2 cm adrenal gland nodule. Small hypodense nodule in the inferior right kidney measuring 0.8 cm, too small to fully characterize. No hydronephrosis. No renal calculi. Urinary bladder unremarkable. Stomach/Bowel: Stomach is within normal limits. Appendix appears normal. No evidence of bowel wall thickening, distention, or inflammatory changes. Colonic diverticula without evidence of diverticulitis. Vascular/Lymphatic: Aortic atherosclerosis. No enlarged abdominal or pelvic lymph nodes. Reproductive: Uterus and bilateral adnexa are unremarkable. Other: No abdominal wall abnormality.  No abdominopelvic ascites. Musculoskeletal: Chronic severe wedge compression deformity at L1. IMPRESSION: 1. No acute intra-abdominal or intrapelvic process. 2. Bibasilar interstitial opacities and trace effusions, possibly reflecting pulmonary edema, infection not excluded. 3. Colonic diverticulosis without evidence of diverticulitis. 4. Aortic atherosclerosis. Aortic Atherosclerosis (ICD10-I70.0). Electronically Signed   By: NAudie PintoM.D.   On: 02/29/2020 14:51   DG Chest Port 1 View  Result Date: 03/01/2020 CLINICAL DATA:  Hypoxia. EXAM: PORTABLE CHEST 1  VIEW COMPARISON:  Chest x-ray 02/25/2020, 01/04/2020.  CT 05/22/2019. FINDINGS: Mediastinum and hilar structures normal. Heart size stable. Bilateral interstitial prominence again noted, right side greater than left. Right base atelectasis with elevation right hemidiaphragm. Small right pleural effusion cannot be excluded. Thoracolumbar spine scoliosis. IMPRESSION: Bilateral interstitial prominence again noted, right side greater than left. Right base atelectasis with elevation of the right hemidiaphragm. Small right pleural effusion cannot be excluded. Electronically Signed   By: TMarcello Moores Register   On: 03/01/2020 12:15   DG Chest Portable 1 View  Result Date: 02/15/2020 CLINICAL DATA:  Shortness of breath EXAM: PORTABLE CHEST 1 VIEW COMPARISON:  01/11/2020 FINDINGS: Interstitial prominence. Probable mild atelectasis at the lung bases. No pleural effusion. No pneumothorax. Normal heart size. IMPRESSION: Interstitial prominence is likely chronic with possible superimposed mild edema. Electronically Signed   By: PMacy MisM.D.   On: 03/01/2020 14:04   ECHOCARDIOGRAM COMPLETE  Result Date: 02/18/2020    ECHOCARDIOGRAM REPORT   Patient Name:   Anne TORRYDate of Exam: 03/01/2020 Medical Rec #:  [id number]     Height:       68.0 in Accession #:    [id number]    Weight:       180.8 lb Date of Birth:  5October 23, 1962     BSA:          1.958 m Patient Age:    89years       BP:           141/89 mmHg Patient Gender: F              HR:           133 bpm. Exam Location:  [facility] Procedure: 2D Echo, Cardiac Doppler and Color Doppler Indications:     I26.99 Pulmnary embolus  History:         Patient has prior history of Echocardiogram examinations, most                  recent  01/01/2020. Risk Factors:Hypertension and Remote                  Polysubstance abuse. Stroke. Coronary artery disease. Asthma.  Sonographer:     Wilford Sports Rodgers-Jones Referring Phys:  [id number] Sidney Ace Diagnosing Phys: Yolonda Kida  MD  Sonographer Comments: Technically difficult study due to poor echo windows. IMPRESSIONS  1. Left ventricular ejection fraction, by estimation, is 55 to 60%. The left ventricle has normal function. The left ventricle has no regional wall motion abnormalities. Left ventricular diastolic parameters are consistent with Grade I diastolic dysfunction (impaired relaxation).  2. Right ventricular systolic function is normal. The right ventricular size is normal.  3. The mitral valve is grossly normal. Trivial mitral valve regurgitation.  4. The aortic valve is grossly normal. Aortic valve regurgitation is not visualized. FINDINGS  Left Ventricle: Left ventricular ejection fraction, by estimation, is 55 to 60%. The left ventricle has normal function. The left ventricle has no regional wall motion abnormalities. The left ventricular internal cavity size was normal in size. There is  no left ventricular hypertrophy. Left ventricular diastolic parameters are consistent with Grade I diastolic dysfunction (impaired relaxation). Right Ventricle: The right ventricular size is normal. No increase in right ventricular wall thickness. Right ventricular systolic function is normal. Left Atrium: Left atrial size was normal in size. Right Atrium: Right atrial size was normal in size. Pericardium: There is no evidence of pericardial effusion. Mitral Valve: The mitral valve is grossly normal. Trivial mitral valve regurgitation. Tricuspid Valve: The tricuspid valve is normal in structure. Tricuspid valve regurgitation is mild. Aortic Valve: The aortic valve is grossly normal. Aortic valve regurgitation is not visualized. Aortic regurgitation PHT measures 297 msec. Pulmonic Valve: The pulmonic valve was normal in structure. Pulmonic valve regurgitation is not visualized. Aorta: The aortic root is normal in size and structure. IAS/Shunts: No atrial level shunt detected by color flow Doppler.  LEFT VENTRICLE PLAX 2D LVIDd:         3.04 cm   Diastology LVIDs:         2.20 cm  LV e' lateral:   4.03 cm/s LV PW:         0.88 cm  LV E/e' lateral: 13.7 LV IVS:        1.25 cm  LV e' medial:    3.81 cm/s LVOT diam:     1.80 cm  LV E/e' medial:  14.5 LVOT Area:     2.54 cm  RIGHT VENTRICLE RV Basal diam:  3.87 cm RV S prime:     11.10 cm/s LEFT ATRIUM           Index      RIGHT ATRIUM          Index LA diam:      3.00 cm 1.53 cm/m RA Area:     8.51 cm LA Vol (A2C): 17.4 ml 8.89 ml/m RA Volume:   18.10 ml 9.24 ml/m LA Vol (A4C): 9.5 ml  4.84 ml/m  AORTIC VALVE AI PHT:      297 msec  AORTA Ao Root diam: 2.80 cm MITRAL VALVE MV Area (PHT): 5.97 cm    SHUNTS MV Decel Time: 127 msec    Systemic Diam: 1.80 cm MV E velocity: 55.30 cm/s MV A velocity: 77.60 cm/s MV E/A ratio:  0.71 Dwayne D Callwood MD Electronically signed by Yolonda Kida MD Signature Date/Time: 03/15/2020/5:46:59 PM    Final     Microbiology: Recent Results (  from the past 240 hour(s))  MRSA PCR Screening     Status: None   Collection Time: 03/01/20 12:11 PM   Specimen: Nasal Mucosa; Nasopharyngeal  Result Value Ref Range Status   MRSA by PCR NEGATIVE NEGATIVE Final    Comment:        The GeneXpert MRSA Assay (FDA approved for NASAL specimens only), is one component of a comprehensive MRSA colonization surveillance program. It is not intended to diagnose MRSA infection nor to guide or monitor treatment for MRSA infections. Performed at [facility], [address] [place], [place] [address]      Labs: Basic Metabolic Panel: Recent Labs  Lab 03/03/2020 0604  NA 148*  K 4.1  CL 114*  CO2 22  GLUCOSE 113*  BUN 48*  CREATININE 1.19*  CALCIUM 11.7*  MG 2.6*  PHOS 4.7*   Liver Function Tests: No results for input(s): AST, ALT, ALKPHOS, BILITOT, PROT, ALBUMIN in the last 168 hours. No results for input(s): LIPASE, AMYLASE in the last 168 hours. No results for input(s): AMMONIA in the last 168 hours. CBC: Recent Labs  Lab 03/01/20 0544  03/09/2020 0604  WBC 13.9* 19.2*  NEUTROABS 9.4* 13.2*  HGB 15.4* 16.0*  HCT 48.3* 49.9*  MCV 88.3 88.3  PLT 238 206   Cardiac Enzymes: No results for input(s): CKTOTAL, CKMB, CKMBINDEX, TROPONINI in the last 168 hours. D-Dimer No results for input(s): DDIMER in the last 72 hours. BNP: Invalid input(s): POCBNP CBG: Recent Labs  Lab 03/01/20 1131 03/01/20 1157  GLUCAP 113* 97   Anemia work up No results for input(s): VITAMINB12, FOLATE, FERRITIN, TIBC, IRON, RETICCTPCT in the last 72 hours. Urinalysis    Component Value Date/Time   COLORURINE YELLOW (A) 02/25/2020 0654   APPEARANCEUR CLOUDY (A) 02/25/2020 0654   LABSPEC 1.023 02/25/2020 0654   PHURINE 5.0 02/25/2020 0654   GLUCOSEU NEGATIVE 02/25/2020 0654   HGBUR LARGE (A) 02/25/2020 0654   BILIRUBINUR NEGATIVE 02/25/2020 0654   KETONESUR NEGATIVE 02/25/2020 0654   PROTEINUR NEGATIVE 02/25/2020 0654   NITRITE NEGATIVE 02/25/2020 0654   LEUKOCYTESUR NEGATIVE 02/25/2020 0654   Sepsis Labs Invalid input(s): PROCALCITONIN,  WBC,  LACTICIDVEN  I have spent 50 minutes face to face encounter with the patient and on the ward discussing the patients care, assessment, plan and disposition with other care givers. >50% of the time was devoted  coordinating care.    SIGNED:  Deatra James, MD  [facility] 03/07/2020, 1:46 PM Pager   If 7PM-7AM, please contact night-coverage [email or web address] Password [email or web address]

## 2020-03-16 NOTE — Progress Notes (Signed)
PALLIATIVE NOTE:  Chart reviewed updates received by RN.  Patient continues on full comfort care.  Unresponsive.  Appears comfortable without any acute distress.  Daughter is at the bedside resting with patient.  All symptoms appropriately managed with current regimen no changes needed at this time.   Alda Lea, AGPCNP-BC  Palliative Medicine Team [phone]  No charge

## 2020-03-16 NOTE — Progress Notes (Signed)
Pt expired at 2018. This RN was present in room when pt expired. NP, Rufina Falco notified by secure chat. AC, Candice notified. [name] notified and is on the way to the hospital.

## 2020-03-16 NOTE — Progress Notes (Signed)
PROGRESS NOTE    Anne [name]  EEF:[id number] DOB: 05/03/1961 DOA: 03/09/2020 PCP: Donnie Coffin, MD    Brief Narrative: HPI: Anne [name] is a 59 y.o. female with medical history significant of CVA, hypertension, coronary artery disease presents the emergency department for evaluation of abdominal pain associated with weakness and cough.  The patient is post CVA thus little difficult to understand this majority the history is gained by speaking with the ED physician in reading the associated documentation.  Patient reports she is here because she felt weak and more short of breath throughout the day.  No other obvious infectious contacts.  No known sick contacts.  No history of fevers.  No other associated symptoms.  Unable to fully characterize pain.  On my evaluation patient is resting in bed.  She appears chronically ill.  Is difficult to communicate with her secondary to CVA.  However she does understand and nods head yes or no to interview questions.  For the abdominal pain etiology is unclear.  She had a CT abdomen done in the emergency department that did not reveal any acute etiology.  Chest imaging was significant for interstitial edema.  Working diagnosis is acute hypoxic respiratory failure secondary to concomitant COPD and CHF.  8/12: Patient is much more awake today.  Communicative.  No slurred speech.  Remains on 3 to 4 L nasal cannula.  8/13: Patient seen and examined.  A little bit lethargic but respiratory status appears improved.  Now on 3 L last nasal cannula.  Saturating adequately.  8/14: Patient seen and examined.  More lethargic this morning.  No change in saturations.  Continues to require 3 L nasal cannula.  Breath sounds improving.  Per bedside RN patient was unable to sleep well last night.  8/15: Patient seen and examined.  Remains very lethargic.  Oxygen status actually improving.  Titrated down to 2 L nasal cannula.  Breath sounds also improving.  Patient seems  almost catatonic.  She does open eyes but very limited participation in interview.  I related that her oral intake has been very poor and that if she continues to not eat food after consider a Dobbhoff and initiation of tube feeds.  I also asked her about depressive symptoms which she did endorse.  I have called consultation with psychiatry.  8/16: Patient seen and examined.  Remains lethargic but improved over interval.  Breath sounds improving.  Oxygen status stabilizing.  Oral intake remains poor.  Psychiatric consultation called.  Discussed with Dr. Janese Banks.  Formal consultation note pending.  He was able to get a hold of family yesterday to discuss her presentation prior to starting any pharmacologic agents.  03/06/2020 -patient was seen and examined it appears the patient has been progressively deteriorating, lethargic not engaging in conversation -CT scan revealed pulmonary embolism.  Patient started on heparin drip-vascular surgery consulted Patient is confirmed DNR/DNI -palliative care will be consulted   Addendum: Family present at bedside.  Discussed with critical care team: Dr. Mortimer Fries.  Vascular team has offered thrombectomy. Patient and family has refused any further intervention including BiPAP -they have verbalized to Dr. Mortimer Fries for comfort care measures.  Orders has been placed by  PCCM critical  Met with the family at bedside confirmed comfort care measures...   03/03/2020-patient will remain under comfort care measures  04/04/2020 -patient remains unresponsive, tachycardic, opens her eyes to pain stimuli Hypotensive, hypoxic satting 65% with supplemental oxygen  We will continue with comfort care measures Palliative care team  following, appreciate input and assist   Assessment & Plan:   Principal Problem:   Acute pulmonary embolism ([place]) Active Problems:   Acute hypoxemic respiratory failure (HCC)   CAD (coronary artery disease)   Hypertension   Coronary artery disease of native  artery of native heart with stable angina pectoris (HCC)   Hemiparesis affecting left side as late effect of stroke (HCC)   Sepsis ([place])  Acute hypoxemic respiratory failure -exacerbated by acute on chronic D CHF/COPD exacerbation/possible new PE -CT angiogram of the chest >> reported acute pulmonary emboli involving the right and left pulmonary artery multiple lobular segmental emboli --possible saddle emboli -Patient has been initiated on heparin drip -off now-no other anticoagulation -Pulmonary critical care following closely  -Remain on BiPAP satting 92% >>>> off BiPAP now per family request >>> supplemental oxygen via nasal cannula -Vascular surgery consulted -recommended embolectomy patient's family has declined- Also patient would not be able to tolerate procedure  Palliative care team consulted-following   Acute decompensated heart failure and concomitant COPD -PCCM was following-signed off - Patient did not appear markedly hypervolemic.  Believe that majority of symptoms driven by underlying COPD. 2d echo 01/01/20: normal EF.  Grade 2 diastolic dysfunction  Continue bronchodilator therapy  Last dose of antibiotic 8/17 Worsening leukocytosis 13.9--19.2 today (anticipating restarting empiric antibiotics) Hold Lasix   Depression - Lexapro, Zyprexa, thiamine -Palliative care consulted to determine goal of care -N.p.o. for medications of  Acute kidney injury, resolved Suspect prerenal azotemia in the setting of diuresis -We will continue to hold Lasix, Lisinopril restarted  Avoid nephrotoxins  Coronary artery disease History of CVA Continue dual antiplatelet therapy -N.p.o. under comfort care measures  Hypertension N.p.o. holding off meds  Suspected malnutrition RD consult Continue Megace  Hyperlipidemia Continue home Lipitor  DVT prophylaxis: Lovenox Code Status: Full Family Communication:  daughter [name] via phone [phone] on 03/07/2020  Met with  the family and daughter present at bedside-confirmed pursuing comfort care measures.  Disposition Plan: Status is: Inpatient  Remains inpatient appropriate because:Inpatient level of care appropriate due to severity of illness   Dispo: The patient is from: Home              Anticipated d/c is to: Comfort care measures-hospice              Anticipated d/c date is: 3 days              Patient currently is not medically stable to d/c.        Consultants:  PCCM General surgery Vascular surgery Palliative care team  Procedures:   Possible thrombectomy  Antimicrobials:   Levaquin   Subjective: Patient seen and examined.  A little bit more awake today.  Again endorses weakness.  Not much participation in interview.  Objective: Vitals:   02/21/2020 1800 03/03/20 0444 03/03/20 0900 27-Mar-2020 1107  BP:  (!) 84/27 93/60 (!) 75/59  Pulse: (!) 133 (!) 131 (!) 131 (!) 158  Resp: _0 Temp:  98.2 F (36.8 C) (!) 97.5 F (36.4 C) 98.4 F (36.9 C)  TempSrc:  Axillary Axillary Oral  SpO2: (!) 84% (!) 84% (!) 82% (!) 65%  Weight:      Height:        Intake/Output Summary (Last 24 hours) at Mar 27, 2020 1247 Last data filed at 03/03/2020 1406 Gross per 24 hour  Intake 0 ml  Output --  Net 0 ml   Filed Weights   03/01/20 0500 03/01/20 1209  02/26/2020 0158  Weight: 82 kg 82 kg 82.5 kg    Examination:  Remains lethargic, unresponsive, opens her eyes to pain stimuli  General: Lethargic HEENT: Normocephalic, atraumatic Neck, ++ JVD, Heart: Tachycardic Lungs: Shallow breathing Abdomen: Soft, nontender, nondistended,  Extremities: Normal, atraumatic, no clubbing or cyanosis,  Skin: No rashes or lesions, normal color Neurologic: Unresponsive, opens eyes to pain stimuli Chronic right-sided deficit consistent with prior stroke.       Data Reviewed: I have personally reviewed following labs and imaging studies  CBC: Recent Labs  Lab 02/27/20 0804 02/28/20 0603  02/29/20 0441 03/01/20 0544 02/17/2020 0604  WBC 18.1* 16.4* 18.0* 13.9* 19.2*  NEUTROABS 15.1* 13.2* 13.5* 9.4* 13.2*  HGB 14.5 14.9 15.1* 15.4* 16.0*  HCT 43.1 44.9 47.2* 48.3* 49.9*  MCV 83.7 84.7 87.4 88.3 88.3  PLT 253 257 263 238 408   Basic Metabolic Panel: Recent Labs  Lab 02/27/20 0804 02/28/20 0603 02/29/20 0441 03/01/2020 0604  NA 138 143 142 148*  K 4.3 4.2 4.0 4.1  CL 103 107 108 114*  CO2 23 24 21* 22  GLUCOSE 121* 110* 104* 113*  BUN 40* 32* 30* 48*  CREATININE 0.86 0.74 0.61 1.19*  CALCIUM 11.4* 11.5* 11.5* 11.7*  MG  --   --   --  2.6*  PHOS  --   --   --  4.7*   GFR: Estimated Creatinine Clearance: 56.6 mL/min (A) (by C-G formula based on SCr of 1.19 mg/dL (H)). Liver Function Tests: No results for input(s): AST, ALT, ALKPHOS, BILITOT, PROT, ALBUMIN in the last 168 hours. No results for input(s): LIPASE, AMYLASE in the last 168 hours. No results for input(s): AMMONIA in the last 168 hours. Coagulation Profile: Recent Labs  Lab 03/01/20 1338  INR 1.4*   Cardiac Enzymes: No results for input(s): CKTOTAL, CKMB, CKMBINDEX, TROPONINI in the last 168 hours. BNP (last 3 results) No results for input(s): PROBNP in the last 8760 hours. HbA1C: No results for input(s): HGBA1C in the last 72 hours. CBG: Recent Labs  Lab 03/01/20 1131 03/01/20 1157  GLUCAP 113* 97   Lipid Profile: No results for input(s): CHOL, HDL, LDLCALC, TRIG, CHOLHDL, LDLDIRECT in the last 72 hours. Thyroid Function Tests: No results for input(s): TSH, T4TOTAL, FREET4, T3FREE, THYROIDAB in the last 72 hours. Anemia Panel: No results for input(s): VITAMINB12, FOLATE, FERRITIN, TIBC, IRON, RETICCTPCT in the last 72 hours. Sepsis Labs: No results for input(s): PROCALCITON, LATICACIDVEN in the last 168 hours.  Recent Results (from the past 240 hour(s))  SARS Coronavirus 2 by RT PCR (hospital order, performed in [facility] hospital lab) Nasopharyngeal Nasopharyngeal Swab     Status:  None   Collection Time: 02/23/2020  1:22 PM   Specimen: Nasopharyngeal Swab  Result Value Ref Range Status   SARS Coronavirus 2 NEGATIVE NEGATIVE Final    Comment: (NOTE) SARS-CoV-2 target nucleic acids are NOT DETECTED.  The SARS-CoV-2 RNA is generally detectable in upper and lower respiratory specimens during the acute phase of infection. The lowest concentration of SARS-CoV-2 viral copies this assay can detect is 250 copies / mL. A negative result does not preclude SARS-CoV-2 infection and should not be used as the sole basis for treatment or other patient management decisions.  A negative result may occur with improper specimen collection / handling, submission of specimen other than nasopharyngeal swab, presence of viral mutation(s) within the areas targeted by this assay, and inadequate number of viral copies (<250 copies / mL). A negative  result must be combined with clinical observations, patient history, and epidemiological information.  Fact Sheet for Patients:   StrictlyIdeas.no  Fact Sheet for Healthcare Providers: BankingDealers.co.za  This test is not yet approved or  cleared by the [place] FDA and has been authorized for detection and/or diagnosis of SARS-CoV-2 by FDA under an Emergency Use Authorization (EUA).  This EUA will remain in effect (meaning this test can be used) for the duration of the COVID-19 declaration under Section 564(b)(1) of the Act, 21 U.S.C. section 360bbb-3(b)(1), unless the authorization is terminated or revoked sooner.  Performed at [facility], [address] [place], [place] [address]   MRSA PCR Screening     Status: None   Collection Time: 03/01/20 12:11 PM   Specimen: Nasal Mucosa; Nasopharyngeal  Result Value Ref Range Status   MRSA by PCR NEGATIVE NEGATIVE Final    Comment:        The GeneXpert MRSA Assay (FDA approved for NASAL specimens only), is one component of  a comprehensive MRSA colonization surveillance program. It is not intended to diagnose MRSA infection nor to guide or monitor treatment for MRSA infections. Performed at [facility], [address], [place], [place] [address]          Radiology Studies: No results found.      Scheduled Meds:  Continuous Infusions: . sodium chloride Stopped (02/29/20 0526)     LOS: 9 days    Time spent: 25 minutes    Deatra James, MD [facility] Please feel free to use secure chat-and text via needed  If 7PM-7AM, please contact night-coverage 2020-04-01, 12:47 PM

## 2020-03-16 DEATH — deceased

## 2020-04-14 ENCOUNTER — Other Ambulatory Visit (HOSPITAL_COMMUNITY): Payer: Self-pay | Admitting: Interventional Radiology

## 2022-03-03 IMAGING — XA IR PERCUTANEOUS ART THORMBECTOMY/INFUSION INTRACRANIAL INCLUDE D
1 series · 15 of 24 positions shown · non-contrast
Comparison: CT/CT angiogram of the head and neck December 31, 2019.

INDICATION: 59-year-old female with past medical history significant for bone
substance abuse, hypertension, coronary vasospasms and CAD; baseline
modified Rozemarijn Gooijer 0. She was brought to an outside hospital
after being found down. At admission, right gaze deviation and
left-sided hemiplegia were noted, NIHSS 18. She was last seen well
at [DATE] a.m. on 12/31/2019. No intravenous tPA administered as she
was outside the window. Head CT showed ongoing right MCA territory
infarct with hypodensity in the right insula, basal ganglia, right
frontal and temporoparietal regions. CT angiogram of the head and
neck showed occlusion of the right internal carotid artery at the
bulb with mixed density plaque as well as occlusion of distal right
M1/MCA segment. CT perfusion showed a core infarct of 53 mL with an
ischemic penumbra of 151 mL. She was taken to our service for
emergency mechanical thrombectomy.

EXAM:
Diagnostic cerebral angiogram and mechanical thrombectomy
TECHNIQUE: Informed written consent was obtained from the patient's daughter
after a thorough discussion of the procedural risks, benefits and
alternatives. All questions were addressed. Maximal Sterile Barrier
Technique was utilized including caps, mask, sterile gowns, sterile
gloves, sterile drape, hand hygiene and skin antiseptic. A timeout
was performed prior to the initiation of the procedure.

[Series 12: <mpr range> · axial · 5.0mm · 0.35mm/px · z∈[-168,+6]mm · 15 of 33 slices shown]
[im 1/33]
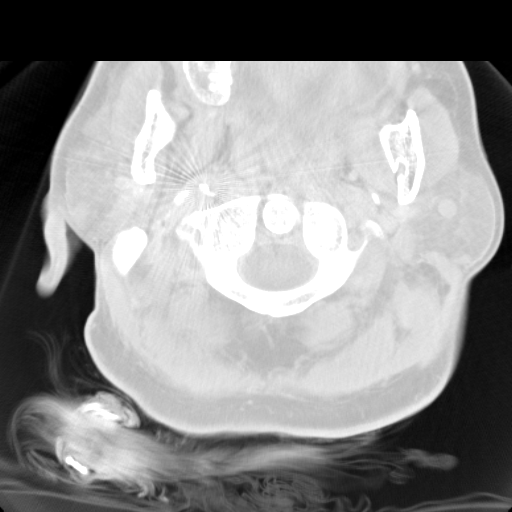
[im 3/33]
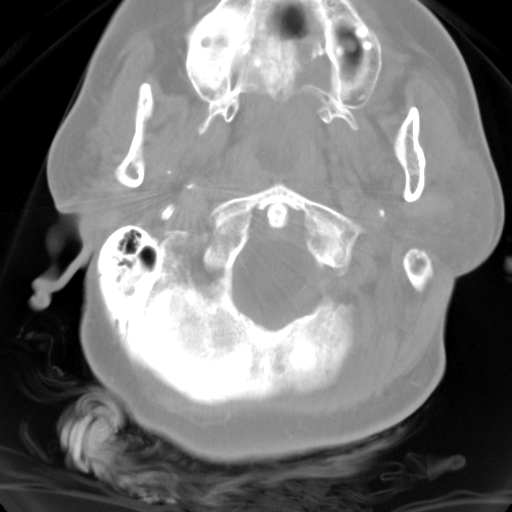
[im 6/33]
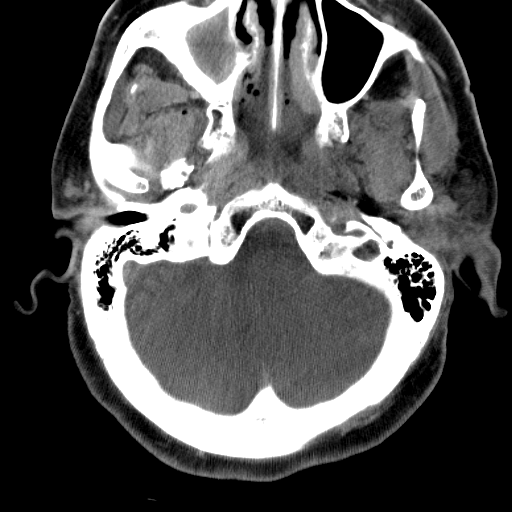
[im 7/33]
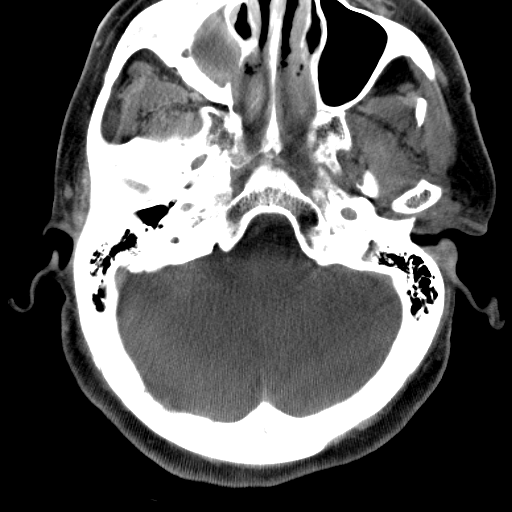
[im 10/33]
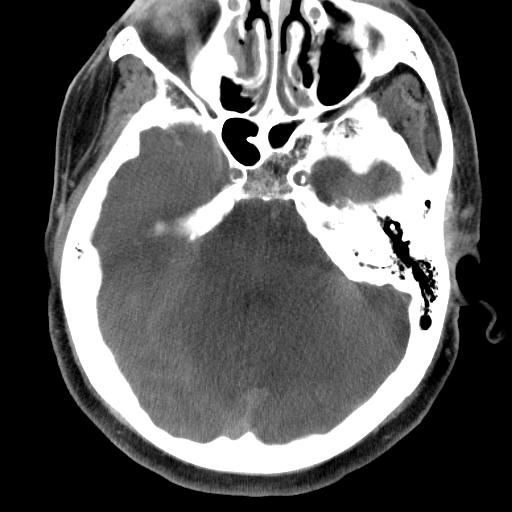
[im 12/33]
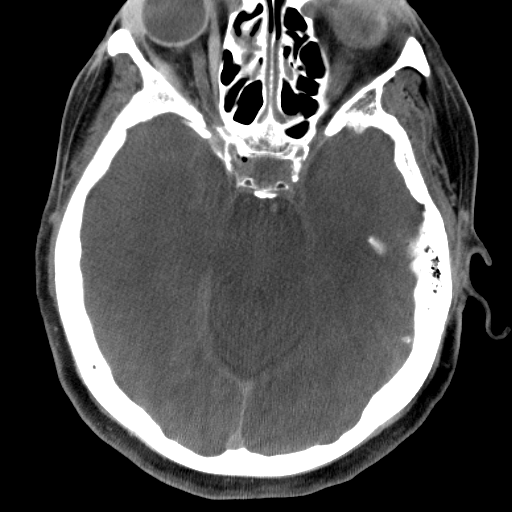
[im 14/33]
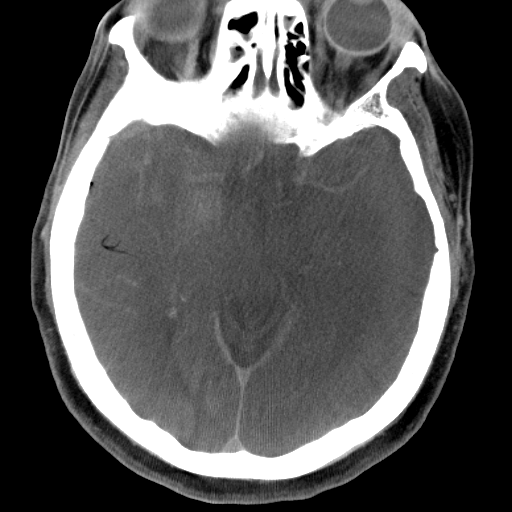
[im 17/33]
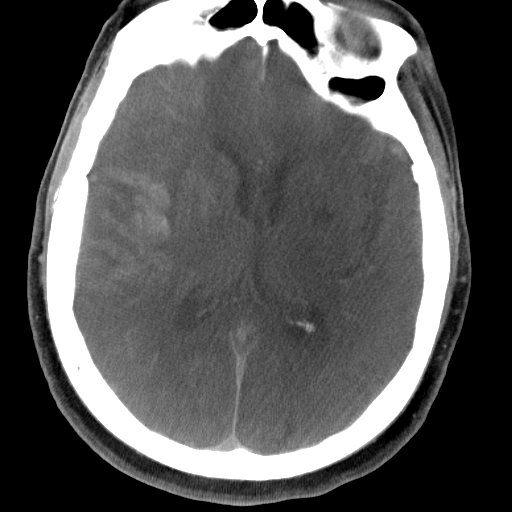
[im 19/33]
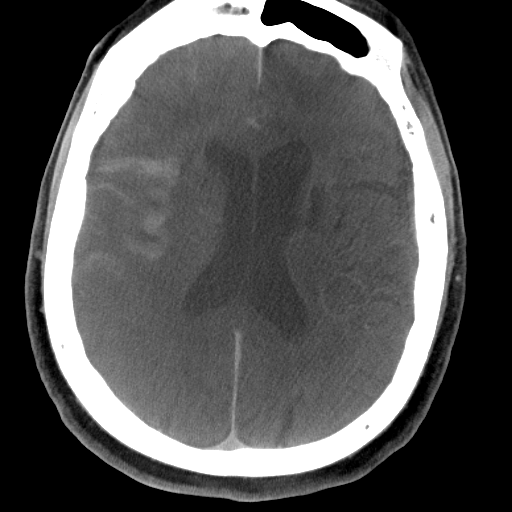
[im 21/33]
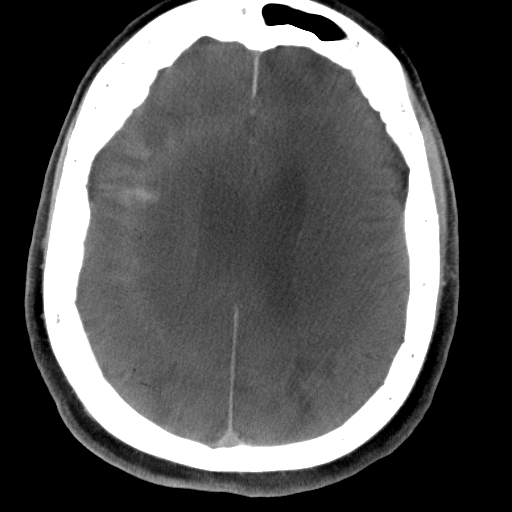
[im 23/33]
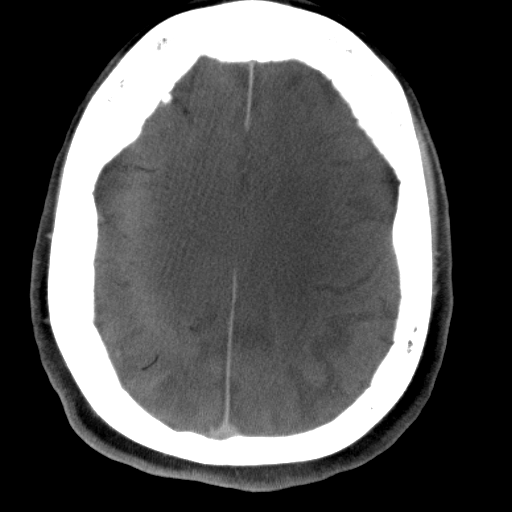
[im 26/33]
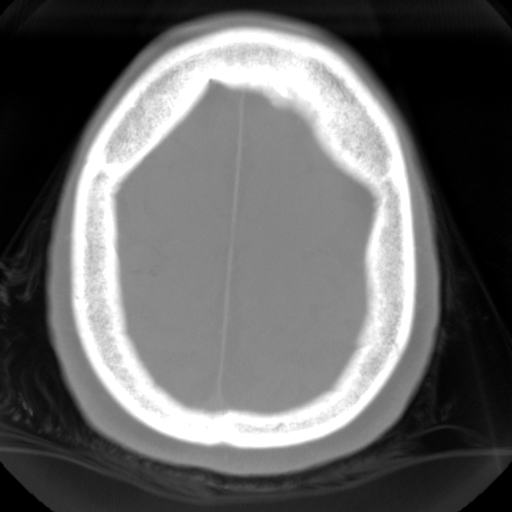
[im 28/33]
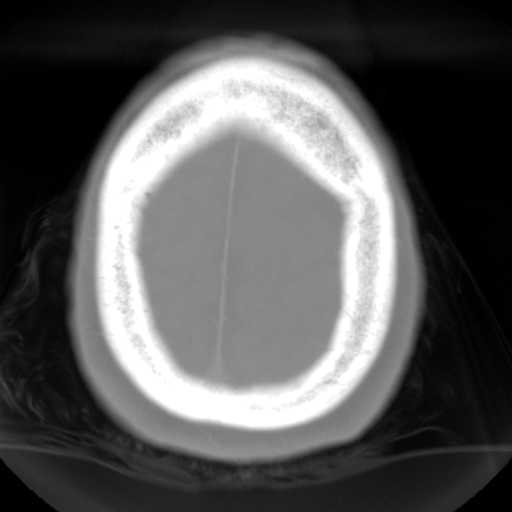
[im 30/33]
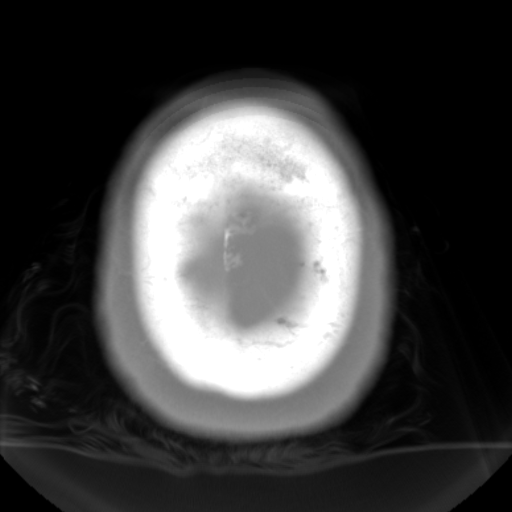
[im 33/33]
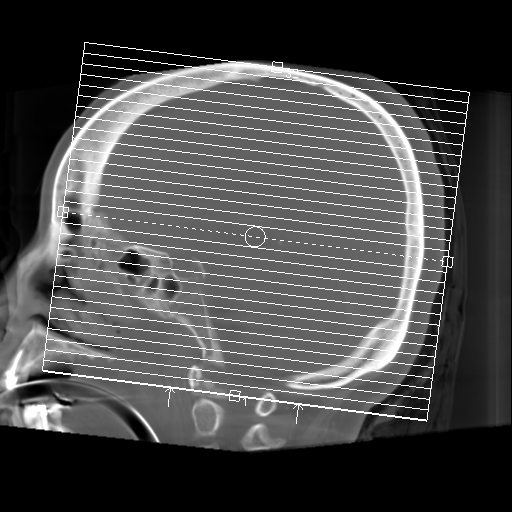

[15 of 24 positions shown; findings below may reference images not displayed]

MEDICATIONS:
No antibiotics administered

ANESTHESIA/SEDATION:
The procedure was performed under general anesthesia.

FLUOROSCOPY TIME:  Fluoroscopy Time: 25 minutes 18 seconds (871
mGy).

COMPLICATIONS:
None immediate.
The right groin was prepped and draped in the usual sterile fashion.
Using a micropuncture kit and the modified Seldinger technique,
access was gained to the right common femoral artery and an 8 French
sheath was placed.

Under fluoroscopy, an 8 French Walrus balloon guide catheter was
navigated over a 6 Shahidah Edy 2 catheter and a 0.035" Terumo
Glidewire into the aortic arch. The catheter was placed into the
right common carotid artery. A roadmap was obtained. The Tiger
2 catheter was then advanced over the wire across the occluded right
carotid bulb into the distal cervical segment of the right ICA. The
balloon guide catheter was then advanced over the Tiger 2
catheter into the distal cervical right ICA. The Tiger 2
catheter was removed. No angiogram was obtained given no blood
return was seen.
FINDINGS: Occlusion of the right internal carotid artery at the bulb with
mixed density plaque.

PROCEDURE:
Under biplane roadmap, a large bore aspiration catheter was
navigated over a phenom 21 microcatheter and a synchro support
microguidewire into the cavernous segment of the right ICA. The
microcatheter was then navigated over the wire into the right M2/MCA
posterior division branch. Then, a 6 x 40 mm solitaire stent
retriever was deployed spanning the mid to distal M1 and M2
segments. The device was allowed to intercalated with the clot for 4
minutes. The microcatheter was removed. The aspiration catheter was
advanced to the level of occlusion and connected to a penumbra
aspiration pump. The guiding catheter balloon was inflated. The
thrombectomy device and aspiration catheter were removed under
constant aspiration.

Right ICA angiogram showed recanalization of the right M1 segment
with near complete revascularization of the right MCA vascular tree
with the exception of slow flow into M4 branches to the mid frontal
region. Embolus to the right A3/ACA segment was noted, likely from
right carotid bulb crossing.

Under biplane roadmap, a large bore aspiration catheter was
navigated over a phenom 21 microcatheter and a synchro support
microguidewire into the cavernous segment of the right ICA. The
microcatheter was then navigated over the wire into the right
A3/ACA. Then, a 3 mm solitaire stent retriever was deployed spanning
the A3 segment. The device was allowed to intercalated with the clot
for 4 minutes. The aspiration catheter was advanced to the level of
the ICA terminus and connected to a penumbra aspiration pump. The
guiding catheter balloon was inflated. The thrombectomy device and
aspiration catheter were removed under constant aspiration.

Follow-up right internal carotid artery angiogram showed complete
recanalization of the right ACA vascular tree (TICI 3). Slow flow in
distal right MCA branches was noted as well as mild in homogeneous
opacification of the mid right M1 segment. The large bore aspiration
catheter was then advanced over the phenom 21 microcatheter and a
synchro support microguidewire into the right M1 segment. The
microcatheter was removed and the aspiration catheter was connected
to a penumbra aspiration pump. Continuous aspiration performed for 2
minutes with brisk flow noted. The aspiration catheter was then
removed.

Follow-up right ICA angiograms with frontal and lateral views of the
head showed some residual slow distal flow, may be related to the
presence of guide catheter within the cervical right ICA.

An exchange length Glidewire was placed into the distal cervical
segment of the right ICA. The guide catheter was then retracted into
the right common carotid artery. Frontal and lateral angiograms of
the neck were obtained.

Revascularization of the right carotid bifurcation confirmed with
atherosclerotic changes with calcified plaques noted in the right
carotid bulb resulting in approximately 53% stenosis. A small non
flow limiting dissection is noted in the mid cervical segment of the
right ICA.

Follow-up right carotid artery angiogram showed preserved
anterograde flow in the right ACA and MCA territory with near
complete resolution of the slow flow in the distal right MCA
branches, except for previously noted cortical mid frontal branches.

The catheter was subsequently withdrawn.

Flat panel CT of the head was obtained and post processed in a
separate workstation with concurrent attending physician
supervision. Selected images were sent to PACS. A small right
Apiq subarachnoid hemorrhage was noted.

Right common femoral artery angiograms with frontal and lateral
views were obtained. The puncture is at the level of the common
femoral artery. The femoral sheath was exchanged over the wire for
Perclose ProGlide which was utilized for access closure. Immediate
hemostasis was achieved.
IMPRESSION: 1. Successful mechanical thrombectomy for treatment of the right
M1/MCA occlusion with combined stent retriever and aspiration with
final recanalization TICI 2C.
2. Successful mechanical thrombectomy for treatment embolus to right
A3/ACA with combined stent retriever and aspiration with final
recanalization [id number].
3. Small right Apiq subarachnoid hemorrhage on postprocedural
flat panel CT.

PLAN:
- Transfer to ICU for continued care.

- SBP [DATE]- Bed rest 6h post femoral access- Follow-up head CT
within 4 hours to assess JUMPER stability.- Follow-up as outpatient for
symptomatic right carotid stenosis.
